# Patient Record
Sex: Female | Born: 1950 | Race: Black or African American | Hispanic: No | Marital: Single | Smoking: Former smoker
Health system: Southern US, Community
[De-identification: ages and names within clinical notes are randomized; demographics above are authoritative.]

## PROBLEM LIST (undated history)

## (undated) DIAGNOSIS — M5432 Sciatica, left side: Secondary | ICD-10-CM

## (undated) DIAGNOSIS — R519 Headache, unspecified: Secondary | ICD-10-CM

## (undated) DIAGNOSIS — M5416 Radiculopathy, lumbar region: Secondary | ICD-10-CM

## (undated) DIAGNOSIS — E785 Hyperlipidemia, unspecified: Secondary | ICD-10-CM

## (undated) DIAGNOSIS — G8929 Other chronic pain: Secondary | ICD-10-CM

## (undated) DIAGNOSIS — K219 Gastro-esophageal reflux disease without esophagitis: Secondary | ICD-10-CM

## (undated) DIAGNOSIS — R51 Headache: Secondary | ICD-10-CM

## (undated) DIAGNOSIS — I10 Essential (primary) hypertension: Secondary | ICD-10-CM

## (undated) DIAGNOSIS — M48061 Spinal stenosis, lumbar region without neurogenic claudication: Secondary | ICD-10-CM

## (undated) DIAGNOSIS — M79606 Pain in leg, unspecified: Secondary | ICD-10-CM

## (undated) DIAGNOSIS — M549 Dorsalgia, unspecified: Secondary | ICD-10-CM

## (undated) DIAGNOSIS — Z720 Tobacco use: Secondary | ICD-10-CM

## (undated) HISTORY — DX: Gastro-esophageal reflux disease without esophagitis: K21.9

## (undated) HISTORY — DX: Headache: R51

## (undated) HISTORY — DX: Spinal stenosis, lumbar region without neurogenic claudication: M48.061

## (undated) HISTORY — DX: Headache, unspecified: R51.9

## (undated) HISTORY — DX: Hyperlipidemia, unspecified: E78.5

## (undated) HISTORY — PX: TRACHEOSTOMY: SUR1362

## (undated) HISTORY — DX: Essential (primary) hypertension: I10

## (undated) HISTORY — DX: Tobacco use: Z72.0

---

## 1998-03-01 ENCOUNTER — Emergency Department (HOSPITAL_COMMUNITY): Admission: EM | Admit: 1998-03-01 | Discharge: 1998-03-01 | Payer: Self-pay | Admitting: Emergency Medicine

## 1999-07-18 ENCOUNTER — Ambulatory Visit (HOSPITAL_COMMUNITY): Admission: RE | Admit: 1999-07-18 | Discharge: 1999-07-18 | Payer: Self-pay | Admitting: Family Medicine

## 2000-02-17 ENCOUNTER — Encounter: Admission: RE | Admit: 2000-02-17 | Discharge: 2000-02-17 | Payer: Self-pay | Admitting: Family Medicine

## 2000-03-06 ENCOUNTER — Emergency Department (HOSPITAL_COMMUNITY): Admission: EM | Admit: 2000-03-06 | Discharge: 2000-03-06 | Payer: Self-pay | Admitting: *Deleted

## 2000-03-08 ENCOUNTER — Encounter: Admission: RE | Admit: 2000-03-08 | Discharge: 2000-03-08 | Payer: Self-pay | Admitting: Family Medicine

## 2000-06-12 ENCOUNTER — Encounter: Admission: RE | Admit: 2000-06-12 | Discharge: 2000-06-12 | Payer: Self-pay | Admitting: Family Medicine

## 2000-07-11 ENCOUNTER — Encounter: Admission: RE | Admit: 2000-07-11 | Discharge: 2000-07-11 | Payer: Self-pay | Admitting: Family Medicine

## 2000-09-07 ENCOUNTER — Encounter: Admission: RE | Admit: 2000-09-07 | Discharge: 2000-09-07 | Payer: Self-pay | Admitting: Family Medicine

## 2001-01-29 ENCOUNTER — Encounter: Admission: RE | Admit: 2001-01-29 | Discharge: 2001-01-29 | Payer: Self-pay | Admitting: Family Medicine

## 2001-04-15 ENCOUNTER — Ambulatory Visit (HOSPITAL_COMMUNITY): Admission: RE | Admit: 2001-04-15 | Discharge: 2001-04-15 | Payer: Self-pay | Admitting: Family Medicine

## 2001-05-09 ENCOUNTER — Encounter: Admission: RE | Admit: 2001-05-09 | Discharge: 2001-05-09 | Payer: Self-pay | Admitting: Family Medicine

## 2001-11-29 ENCOUNTER — Encounter: Admission: RE | Admit: 2001-11-29 | Discharge: 2001-11-29 | Payer: Self-pay | Admitting: Family Medicine

## 2002-05-06 ENCOUNTER — Encounter: Admission: RE | Admit: 2002-05-06 | Discharge: 2002-05-06 | Payer: Self-pay | Admitting: Family Medicine

## 2002-05-09 ENCOUNTER — Encounter: Admission: RE | Admit: 2002-05-09 | Discharge: 2002-05-09 | Payer: Self-pay | Admitting: Family Medicine

## 2002-09-03 ENCOUNTER — Emergency Department (HOSPITAL_COMMUNITY): Admission: EM | Admit: 2002-09-03 | Discharge: 2002-09-03 | Payer: Self-pay | Admitting: Emergency Medicine

## 2002-09-03 ENCOUNTER — Encounter: Payer: Self-pay | Admitting: Emergency Medicine

## 2002-11-20 ENCOUNTER — Encounter (INDEPENDENT_AMBULATORY_CARE_PROVIDER_SITE_OTHER): Payer: Self-pay | Admitting: *Deleted

## 2002-12-01 ENCOUNTER — Ambulatory Visit (HOSPITAL_COMMUNITY): Admission: RE | Admit: 2002-12-01 | Discharge: 2002-12-01 | Payer: Self-pay | Admitting: *Deleted

## 2002-12-17 ENCOUNTER — Encounter (INDEPENDENT_AMBULATORY_CARE_PROVIDER_SITE_OTHER): Payer: Self-pay | Admitting: *Deleted

## 2002-12-17 ENCOUNTER — Encounter: Admission: RE | Admit: 2002-12-17 | Discharge: 2002-12-17 | Payer: Self-pay | Admitting: Family Medicine

## 2003-08-11 ENCOUNTER — Encounter: Admission: RE | Admit: 2003-08-11 | Discharge: 2003-08-11 | Payer: Self-pay | Admitting: Sports Medicine

## 2004-02-11 ENCOUNTER — Ambulatory Visit: Payer: Self-pay | Admitting: Family Medicine

## 2004-02-11 ENCOUNTER — Encounter: Admission: RE | Admit: 2004-02-11 | Discharge: 2004-02-11 | Payer: Self-pay | Admitting: Sports Medicine

## 2005-03-14 ENCOUNTER — Ambulatory Visit: Payer: Self-pay | Admitting: Family Medicine

## 2005-04-11 ENCOUNTER — Encounter: Admission: RE | Admit: 2005-04-11 | Discharge: 2005-04-11 | Payer: Self-pay | Admitting: Sports Medicine

## 2005-04-11 ENCOUNTER — Ambulatory Visit: Payer: Self-pay | Admitting: Family Medicine

## 2005-12-08 ENCOUNTER — Ambulatory Visit: Payer: Self-pay

## 2005-12-08 ENCOUNTER — Ambulatory Visit (HOSPITAL_COMMUNITY): Admission: RE | Admit: 2005-12-08 | Discharge: 2005-12-08 | Payer: Self-pay | Admitting: Family Medicine

## 2006-02-13 ENCOUNTER — Emergency Department (HOSPITAL_COMMUNITY): Admission: EM | Admit: 2006-02-13 | Discharge: 2006-02-13 | Payer: Self-pay | Admitting: *Deleted

## 2006-04-16 ENCOUNTER — Encounter: Admission: RE | Admit: 2006-04-16 | Discharge: 2006-04-16 | Payer: Self-pay | Admitting: Sports Medicine

## 2006-05-11 ENCOUNTER — Encounter: Admission: RE | Admit: 2006-05-11 | Discharge: 2006-05-11 | Payer: Self-pay | Admitting: Sports Medicine

## 2006-07-19 DIAGNOSIS — I10 Essential (primary) hypertension: Secondary | ICD-10-CM | POA: Insufficient documentation

## 2006-07-20 ENCOUNTER — Encounter (INDEPENDENT_AMBULATORY_CARE_PROVIDER_SITE_OTHER): Payer: Self-pay | Admitting: *Deleted

## 2006-07-24 LAB — CONVERTED CEMR LAB: Pap Smear: NORMAL

## 2006-08-13 ENCOUNTER — Encounter (INDEPENDENT_AMBULATORY_CARE_PROVIDER_SITE_OTHER): Payer: Self-pay | Admitting: Family Medicine

## 2006-08-13 ENCOUNTER — Ambulatory Visit: Payer: Self-pay | Admitting: Family Medicine

## 2006-08-13 LAB — CONVERTED CEMR LAB
ALT: 25 units/L (ref 0–35)
AST: 30 units/L (ref 0–37)
Albumin: 4.4 g/dL (ref 3.5–5.2)
Alkaline Phosphatase: 105 units/L (ref 39–117)
BUN: 17 mg/dL (ref 6–23)
CO2: 24 meq/L (ref 19–32)
Creatinine, Ser: 0.78 mg/dL (ref 0.40–1.20)
Glucose, Bld: 100 mg/dL — ABNORMAL HIGH (ref 70–99)
MCV: 90.8 fL (ref 78.0–100.0)
Potassium: 3.9 meq/L (ref 3.5–5.3)
RBC: 4.25 M/uL (ref 3.87–5.11)
Sodium: 135 meq/L (ref 135–145)
Total Bilirubin: 0.4 mg/dL (ref 0.3–1.2)
Total Protein: 8.4 g/dL — ABNORMAL HIGH (ref 6.0–8.3)
WBC: 4.2 10*3/uL (ref 4.0–10.5)

## 2006-08-16 ENCOUNTER — Encounter (INDEPENDENT_AMBULATORY_CARE_PROVIDER_SITE_OTHER): Payer: Self-pay | Admitting: Family Medicine

## 2006-08-22 ENCOUNTER — Telehealth (INDEPENDENT_AMBULATORY_CARE_PROVIDER_SITE_OTHER): Payer: Self-pay | Admitting: *Deleted

## 2006-11-27 ENCOUNTER — Telehealth (INDEPENDENT_AMBULATORY_CARE_PROVIDER_SITE_OTHER): Payer: Self-pay | Admitting: Family Medicine

## 2007-08-07 ENCOUNTER — Telehealth (INDEPENDENT_AMBULATORY_CARE_PROVIDER_SITE_OTHER): Payer: Self-pay | Admitting: *Deleted

## 2007-09-02 ENCOUNTER — Ambulatory Visit: Payer: Self-pay | Admitting: Sports Medicine

## 2007-09-02 ENCOUNTER — Encounter (INDEPENDENT_AMBULATORY_CARE_PROVIDER_SITE_OTHER): Payer: Self-pay | Admitting: Family Medicine

## 2007-09-02 ENCOUNTER — Encounter: Admission: RE | Admit: 2007-09-02 | Discharge: 2007-09-02 | Payer: Self-pay | Admitting: Family Medicine

## 2007-09-02 DIAGNOSIS — Z87891 Personal history of nicotine dependence: Secondary | ICD-10-CM | POA: Insufficient documentation

## 2007-09-02 DIAGNOSIS — Z716 Tobacco abuse counseling: Secondary | ICD-10-CM | POA: Insufficient documentation

## 2007-09-02 LAB — CONVERTED CEMR LAB
ALT: 21 units/L (ref 0–35)
Albumin: 4.6 g/dL (ref 3.5–5.2)
BUN: 12 mg/dL (ref 6–23)
Chloride: 103 meq/L (ref 96–112)
Creatinine, Ser: 0.72 mg/dL (ref 0.40–1.20)
Direct LDL: 163 mg/dL — ABNORMAL HIGH
Glucose, Bld: 73 mg/dL (ref 70–99)
Pap Smear: NORMAL
Potassium: 4.1 meq/L (ref 3.5–5.3)
Total Bilirubin: 0.4 mg/dL (ref 0.3–1.2)
Total Protein: 8.5 g/dL — ABNORMAL HIGH (ref 6.0–8.3)

## 2007-09-03 ENCOUNTER — Encounter (INDEPENDENT_AMBULATORY_CARE_PROVIDER_SITE_OTHER): Payer: Self-pay | Admitting: Family Medicine

## 2007-09-05 ENCOUNTER — Telehealth (INDEPENDENT_AMBULATORY_CARE_PROVIDER_SITE_OTHER): Payer: Self-pay | Admitting: Family Medicine

## 2007-09-09 ENCOUNTER — Telehealth (INDEPENDENT_AMBULATORY_CARE_PROVIDER_SITE_OTHER): Payer: Self-pay | Admitting: *Deleted

## 2008-02-13 ENCOUNTER — Telehealth (INDEPENDENT_AMBULATORY_CARE_PROVIDER_SITE_OTHER): Payer: Self-pay | Admitting: *Deleted

## 2008-03-12 ENCOUNTER — Ambulatory Visit: Payer: Self-pay | Admitting: Family Medicine

## 2008-03-12 DIAGNOSIS — E785 Hyperlipidemia, unspecified: Secondary | ICD-10-CM | POA: Insufficient documentation

## 2008-11-02 ENCOUNTER — Ambulatory Visit: Payer: Self-pay | Admitting: Family Medicine

## 2008-11-02 ENCOUNTER — Encounter (INDEPENDENT_AMBULATORY_CARE_PROVIDER_SITE_OTHER): Payer: Self-pay | Admitting: Family Medicine

## 2008-11-02 ENCOUNTER — Encounter: Admission: RE | Admit: 2008-11-02 | Discharge: 2008-11-02 | Payer: Self-pay | Admitting: Family Medicine

## 2008-11-02 LAB — CONVERTED CEMR LAB
Albumin: 4.4 g/dL (ref 3.5–5.2)
Calcium: 9.8 mg/dL (ref 8.4–10.5)
Creatinine, Ser: 0.71 mg/dL (ref 0.40–1.20)
Potassium: 4.5 meq/L (ref 3.5–5.3)
Total Bilirubin: 0.3 mg/dL (ref 0.3–1.2)
Total CHOL/HDL Ratio: 4.1

## 2008-11-03 ENCOUNTER — Encounter (INDEPENDENT_AMBULATORY_CARE_PROVIDER_SITE_OTHER): Payer: Self-pay | Admitting: Family Medicine

## 2009-03-09 ENCOUNTER — Encounter: Payer: Self-pay | Admitting: Family Medicine

## 2009-03-09 ENCOUNTER — Telehealth: Payer: Self-pay | Admitting: Family Medicine

## 2009-11-23 ENCOUNTER — Ambulatory Visit: Payer: Self-pay | Admitting: Family Medicine

## 2009-11-23 ENCOUNTER — Encounter: Payer: Self-pay | Admitting: Family Medicine

## 2009-11-23 ENCOUNTER — Encounter: Admission: RE | Admit: 2009-11-23 | Discharge: 2009-11-23 | Payer: Self-pay | Admitting: Family Medicine

## 2009-11-23 DIAGNOSIS — J45909 Unspecified asthma, uncomplicated: Secondary | ICD-10-CM | POA: Insufficient documentation

## 2009-11-23 LAB — CONVERTED CEMR LAB
ALT: 20 units/L (ref 0–35)
AST: 23 units/L (ref 0–37)
Albumin: 4.8 g/dL (ref 3.5–5.2)
Alkaline Phosphatase: 90 units/L (ref 39–117)
BUN: 37 mg/dL — ABNORMAL HIGH (ref 6–23)
CO2: 21 meq/L (ref 19–32)
Calcium: 9.9 mg/dL (ref 8.4–10.5)
Chloride: 103 meq/L (ref 96–112)
Cholesterol: 231 mg/dL — ABNORMAL HIGH (ref 0–200)
Creatinine, Ser: 1.05 mg/dL (ref 0.40–1.20)
Glucose, Bld: 98 mg/dL (ref 70–99)
HDL: 60 mg/dL (ref >39)
LDL Cholesterol: 146 mg/dL — ABNORMAL HIGH (ref 0–99)
Potassium: 5.1 meq/L (ref 3.5–5.3)
Sodium: 137 meq/L (ref 135–145)
Total Bilirubin: 0.3 mg/dL (ref 0.3–1.2)
Total CHOL/HDL Ratio: 3.9
Total Protein: 8.5 g/dL — ABNORMAL HIGH (ref 6.0–8.3)
Triglycerides: 126 mg/dL (ref <150)
VLDL: 25 mg/dL (ref 0–40)

## 2009-12-21 ENCOUNTER — Telehealth (INDEPENDENT_AMBULATORY_CARE_PROVIDER_SITE_OTHER): Payer: Self-pay | Admitting: *Deleted

## 2010-03-06 ENCOUNTER — Encounter: Payer: Self-pay | Admitting: Family Medicine

## 2010-03-09 ENCOUNTER — Telehealth: Payer: Self-pay | Admitting: Family Medicine

## 2010-03-09 ENCOUNTER — Ambulatory Visit: Payer: Self-pay | Admitting: Family Medicine

## 2010-03-09 ENCOUNTER — Encounter: Payer: Self-pay | Admitting: Family Medicine

## 2010-03-09 ENCOUNTER — Inpatient Hospital Stay (HOSPITAL_COMMUNITY): Admission: EM | Admit: 2010-03-09 | Discharge: 2010-03-12 | Payer: Self-pay | Admitting: Emergency Medicine

## 2010-03-23 ENCOUNTER — Encounter: Payer: Self-pay | Admitting: Family Medicine

## 2010-03-23 ENCOUNTER — Ambulatory Visit: Payer: Self-pay | Admitting: Family Medicine

## 2010-03-23 DIAGNOSIS — M5430 Sciatica, unspecified side: Secondary | ICD-10-CM | POA: Insufficient documentation

## 2010-03-28 ENCOUNTER — Encounter: Payer: Self-pay | Admitting: Family Medicine

## 2010-03-30 ENCOUNTER — Encounter
Admission: RE | Admit: 2010-03-30 | Discharge: 2010-04-26 | Payer: Self-pay | Source: Home / Self Care | Admitting: Family Medicine

## 2010-03-30 ENCOUNTER — Encounter: Payer: Self-pay | Admitting: Family Medicine

## 2010-03-31 ENCOUNTER — Encounter: Payer: Self-pay | Admitting: Family Medicine

## 2010-04-22 ENCOUNTER — Ambulatory Visit: Payer: Self-pay | Admitting: Family Medicine

## 2010-04-22 ENCOUNTER — Telehealth: Payer: Self-pay | Admitting: Family Medicine

## 2010-04-25 ENCOUNTER — Encounter: Payer: Self-pay | Admitting: Family Medicine

## 2010-04-25 ENCOUNTER — Telehealth: Payer: Self-pay | Admitting: *Deleted

## 2010-05-10 ENCOUNTER — Telehealth: Payer: Self-pay | Admitting: Family Medicine

## 2010-05-10 ENCOUNTER — Ambulatory Visit: Payer: Self-pay | Admitting: Family Medicine

## 2010-05-11 ENCOUNTER — Encounter: Payer: Self-pay | Admitting: Family Medicine

## 2010-05-20 ENCOUNTER — Encounter: Payer: Self-pay | Admitting: Family Medicine

## 2010-05-20 ENCOUNTER — Telehealth (INDEPENDENT_AMBULATORY_CARE_PROVIDER_SITE_OTHER): Payer: Self-pay | Admitting: *Deleted

## 2010-05-24 ENCOUNTER — Encounter: Payer: Self-pay | Admitting: Family Medicine

## 2010-05-26 ENCOUNTER — Encounter (INDEPENDENT_AMBULATORY_CARE_PROVIDER_SITE_OTHER): Payer: Self-pay | Admitting: *Deleted

## 2010-06-13 ENCOUNTER — Ambulatory Visit: Admission: RE | Admit: 2010-06-13 | Discharge: 2010-06-13 | Payer: Self-pay | Source: Home / Self Care

## 2010-06-15 ENCOUNTER — Encounter
Admission: RE | Admit: 2010-06-15 | Discharge: 2010-06-21 | Payer: Self-pay | Source: Home / Self Care | Attending: Physical Medicine & Rehabilitation | Admitting: Physical Medicine & Rehabilitation

## 2010-06-16 ENCOUNTER — Observation Stay (HOSPITAL_COMMUNITY)
Admission: EM | Admit: 2010-06-16 | Discharge: 2010-06-17 | Payer: Self-pay | Source: Home / Self Care | Attending: Emergency Medicine | Admitting: Emergency Medicine

## 2010-06-16 ENCOUNTER — Telehealth: Payer: Self-pay | Admitting: Family Medicine

## 2010-06-16 LAB — POCT I-STAT, CHEM 8
BUN: 19 mg/dL (ref 6–23)
Calcium, Ion: 1.16 mmol/L (ref 1.12–1.32)
Hemoglobin: 16.7 g/dL — ABNORMAL HIGH (ref 12.0–15.0)

## 2010-06-16 LAB — DIFFERENTIAL
Basophils Absolute: 0 10*3/uL (ref 0.0–0.1)
Eosinophils Absolute: 0.1 10*3/uL (ref 0.0–0.7)
Eosinophils Relative: 1 % (ref 0–5)
Lymphocytes Relative: 23 % (ref 12–46)
Monocytes Absolute: 0.8 10*3/uL (ref 0.1–1.0)
Neutro Abs: 6.6 10*3/uL (ref 1.7–7.7)

## 2010-06-16 LAB — URINALYSIS, ROUTINE W REFLEX MICROSCOPIC
Ketones, ur: 15 mg/dL — AB
Leukocytes, UA: NEGATIVE
Specific Gravity, Urine: 1.034 — ABNORMAL HIGH (ref 1.005–1.030)
pH: 5.5 (ref 5.0–8.0)

## 2010-06-16 LAB — URINE MICROSCOPIC-ADD ON

## 2010-06-16 LAB — CBC
Hemoglobin: 15.5 g/dL — ABNORMAL HIGH (ref 12.0–15.0)
MCH: 30 pg (ref 26.0–34.0)
MCHC: 34.2 g/dL (ref 30.0–36.0)
MCV: 87.6 fL (ref 78.0–100.0)
Platelets: 326 10*3/uL (ref 150–400)
RBC: 5.17 MIL/uL — ABNORMAL HIGH (ref 3.87–5.11)
RDW: 13 % (ref 11.5–15.5)
WBC: 9.6 10*3/uL (ref 4.0–10.5)

## 2010-06-17 ENCOUNTER — Ambulatory Visit: Admit: 2010-06-17 | Payer: Self-pay | Admitting: Physical Medicine & Rehabilitation

## 2010-06-17 LAB — COMPREHENSIVE METABOLIC PANEL: CO2: 20 mEq/L (ref 19–32)

## 2010-06-20 ENCOUNTER — Telehealth: Payer: Self-pay | Admitting: *Deleted

## 2010-06-21 NOTE — Letter (Signed)
Summary: Disability Claim Form  Disability Claim Form   Imported By: De Nurse 03/31/2010 12:10:05  _____________________________________________________________________  External Attachment:    Type:   Image     Comment:   External Document

## 2010-06-21 NOTE — Letter (Signed)
Summary: Out of Work  [facility]  [address]   [place], [place] [address]   Phone: [phone]  Fax: [phone]    March 09, 2010   Employee:  Lorra A Carline    To Whom It May Concern:   For Medical reasons, please excuse the above named employee from work for the following dates:  Start:   March 09, 2010  End:   March 15, 2010  If you need additional information, please feel free to contact our office.         Sincerely,    Alvia Grove DO
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
## 2010-06-21 NOTE — Progress Notes (Signed)
Summary: Letter  Phone Note Call from Patient Call back at Home Phone [phone]   Reason for Call: Talk to Nurse Summary of Call: pt requesting letter stating when she can return to work, pt needs MD to specify how long she will be out. Initial call taken by: Knox Royalty,  April 22, 2010 2:25 PM  Follow-up for Phone Call        letter completed and sent to admin. Follow-up by: Helane Rima DO,  April 25, 2010 8:12 AM

## 2010-06-21 NOTE — Progress Notes (Signed)
Summary: refill  Phone Note Refill Request Call back at Work Phone [phone] Message from:  Patient  Refills Requested: Medication #1:  PERCOCET 5-325 MG TABS 1-2 tabs by mouth three times a day as needed for back pain Initial call taken by: De Nurse,  March 09, 2009 11:28 AM  Follow-up for Phone Call        to pcp Follow-up by: Golden Circle RN,  March 09, 2009 11:48 AM  Additional Follow-up for Phone Call Additional follow up Details #1::        will supply small amount since previous PCP did this, but I would like for her to make an appointment to see me in December so that I can evaluate her.   please call in. Additional Follow-up by: Helane Rima DO,  March 09, 2009 12:16 PM    Prescriptions: PERCOCET 5-325 MG TABS (OXYCODONE-ACETAMINOPHEN) 1-2 tabs by mouth three times a day as needed for back pain  #25 x 0   Entered and Authorized by:   Helane Rima DO   Signed by:   Helane Rima DO on 03/09/2009   Method used:   Telephoned to ...       [facility] [address] [phone]* (retail)       [address]       [place], [place]  [address]       Ph: [phone]       Fax: [phone]   RxID:   [id number]   Appended Document: refill LMOVM for pt to call back about info.  Will forward to admin team.  Appended Document: refill pt called back and sched appt for Dec

## 2010-06-21 NOTE — Progress Notes (Signed)
Summary: back pain   Phone Note Call from Patient   Summary of Call: Patient reports sciatic nerve pain worse today. Advised to call for appointment to be seen. Will call triage line  Initial call taken by: Bobby Rumpf  MD,  March 09, 2010 7:42 AM     Appended Document: back pain  she will be here at 8:30 work in. has to find a ride. states she can barely walk

## 2010-06-21 NOTE — Progress Notes (Signed)
Summary: referral  Phone Note Call from Patient Call back at Home Phone [phone]   Caller: Patient Summary of Call: pt was supposed to be referred to Pain Management and needs to know about this Initial call taken by: De Nurse,  April 25, 2010 8:45 AM  Follow-up for Phone Call        referral now in. Follow-up by: Helane Rima DO,  April 27, 2010 2:11 PM  Additional Follow-up for Phone Call Additional follow up Details #1::        faxed referral to pain clinic. Additional Follow-up by: Tessie Fass CMA,  April 28, 2010 11:56 AM

## 2010-06-21 NOTE — Miscellaneous (Signed)
Summary: FMLA Papers  Patients husband dropped off papers to be filled out for her to return to work.  Please call when completed. Bradly Bienenstock  March 28, 2010 9:42 AM   Placed in Dr. Alexander Mt box in Dr. Philis Pique absence. Theresia Lo RN  March 28, 2010 2:36 PM  Forms were already completed by Dr. Earlene Plater, will scan then patient can pick up, date of 04/22/10 was put on the FMLA for a return date to work  History of Present Illness: PCP:  Helane Rima DO

## 2010-06-21 NOTE — Consult Note (Signed)
Summary: Out Pt Rehab  Out Pt Rehab   Imported By: De Nurse 04/27/2010 16:05:31  _____________________________________________________________________  External Attachment:    Type:   Image     Comment:   External Document

## 2010-06-21 NOTE — Miscellaneous (Signed)
Summary: need hard copy  Clinical Lists Changes [facility] called to let md know that this rx for percocet cannot be faxed or called. need hard copy written so pt can come get it.Golden Circle RN  March 09, 2009 4:21 PM  Medications: Rx of PERCOCET 5-325 MG TABS (OXYCODONE-ACETAMINOPHEN) 1-2 tabs by mouth three times a day as needed for back pain;  #25 x 0;  Signed;  Entered by: Helane Rima DO;  Authorized by: Helane Rima DO;  Method used: Print then Give to Patient    Prescriptions: PERCOCET 5-325 MG TABS (OXYCODONE-ACETAMINOPHEN) 1-2 tabs by mouth three times a day as needed for back pain  #25 x 0   Entered and Authorized by:   Helane Rima DO   Signed by:   Helane Rima DO on 03/10/2009   Method used:   Print then Give to Patient   RxID:   [id number]  Please call patient for pick-up.  Appended Document: need hard copy LVM that rx was ready to be picked up.

## 2010-06-21 NOTE — Miscellaneous (Signed)
Summary: Asthma Update - Intermittent     New Problems: ASTHMA, INTERMITTENT (ICD-493.90)   New Problems: ASTHMA, INTERMITTENT (ICD-493.90) 

## 2010-06-21 NOTE — Assessment & Plan Note (Signed)
Summary: hfu,df   Vital Signs:  Patient profile:   60 year old female Height:      63 inches Weight:      149 pounds BMI:     26.49 Temp:     98.4 degrees F oral Pulse rate:   88 / minute BP sitting:   126 / 75  (left arm) Cuff size:   regular  Vitals Entered By: Tessie Fass CMA (March 23, 2010 8:52 AM) CC: hospital f/u Is Patient Diabetic? No Pain Assessment Patient in pain? yes     Location: left leg Intensity: 8   Primary Care Provider:  Helane Rima DO  CC:  hospital f/u.  History of Present Illness: 60 yo F:  1. Sciatica secondary to foraminal stenosis and spondylolisthesis. MRI findings significant for L5-S1 spondylolisthesis and mild L4 foraminal stenosis at L4-L5.  The patient's case was discussed with surgery while patient hospitalized, and determined she is a surgical candidate; however, the patient declined surgery at this time, instead the patient agreed with maximizing pain control and advancing physical therapy as tolerated.  Physical Therapy was consulted on hospital day #2.  The patient's pain medication regimen was adjusted to maximize pain control.  Toradol and prednisone were discontinued.  The patient's dose of gabapentin was increased.  On the hospital day #2, Dilaudid was also discontinued and the patient was continued on p.o. and  transitioned to p.o. MS Contin, naproxen, and morphine to control her pain.  Physical Therapy was advanced as tolerated.  By date of discharge, the patient was able to walk with a walker.  She was well aware that she would need outpatient physical therapy. She provided SMLA forms from her job and that were filled out by Dr. Armen Pickup who recommended 3 weeks of outpatient PT and to follow up with the patient's primary MD in 1-2 weeks to reassess pain and progressive of function.  Today, the patient presents with paperwork from HHPT/OT, recommending further outpatient PT. A HH Safety Evaluation has been completed. Her pain is  fairly controlled on her current regimen, though she says that is never better that 6/10. Function has improved. Still using a walker. No new s/s.    Current Medications (verified): 1)  Ms Contin 30 Mg Xr12h-Tab (Morphine Sulfate) .... One By Mouth Bid 2)  Lisinopril-Hydrochlorothiazide 20-25 Mg Tabs (Lisinopril-Hydrochlorothiazide) .... 1/2 Tablet By Mouth Daily For High Blood Pressure 3)  Ventolin Hfa 108 (90 Base) Mcg/act Aers (Albuterol Sulfate) .Marland Kitchen.. 1-2 Puffs Inhaled Q4h As Needed For Wheezing 4)  Neurontin 300 Mg Caps (Gabapentin) .Marland Kitchen.. 1 Pill By Mouth Daily X 1 Day Then Increase To Two Times A Day 5)  Morphine Sulfate 15 Mg Tabs (Morphine Sulfate) .... One By Mouth Q 4 Hours As Needed Pain 6)  Neurontin 800 Mg Tabs (Gabapentin) .... One By Mouth Tid  Allergies (verified): No Known Drug Allergies  Past History:  Past Medical History: HTN HLD  Tobacco Abuse Back Pain    - Sciatica secondary to foraminal stenosis and spondylolisthesis.     - MRI findings significant for L5-S1 spondylolisthesis and mild L4 foraminal stenosis at L4-L5.      - Outpatient PT/OT.    [facility] Safety Evaluation Completed.    - Surgery has been offered to patient. PMH-FH-SH reviewed for relevance  Review of Systems General:  Denies chills and fever. CV:  Denies chest pain or discomfort, shortness of breath with exertion, and swelling of feet. GI:  Denies abdominal pain, constipation, diarrhea,  nausea, and vomiting. MS:  Complains of joint pain and low back pain; denies joint redness, joint swelling, and loss of strength. Neuro:  Denies brief paralysis, falling down, headaches, numbness, tingling, and weakness.  Physical Exam  General:  Well-developed,well-nourished, in no acute distress; alert, appropriate and cooperative throughout examination. Vitals reviewed. Msk:  Tender to palpation in lower lumbar paraspinal muscles. Decreased ROM in flexion/extension due to pain. + SLR on right. Pulses:  R and L  dorsalis pedis and posterior tibial pulses are full and equal bilaterally. Extremities:  No edema. Neurologic:  Sensory, motor and coordinative functions appear intact.   Impression & Recommendations:  Problem # 1:  SCIATICA, LEFT (ICD-724.3) Assessment Unchanged  Patient stable. No red flags. Will order further PT/OT. Continue pain control regimen as patient more functional with it. No signs of abuse. No sedation. Her updated medication list for this problem includes:    Ms Contin 30 Mg Xr12h-tab (Morphine sulfate) ..... One by mouth bid    Morphine Sulfate 15 Mg Tabs (Morphine sulfate) ..... One by mouth q 4 hours as needed pain    Naprosyn 500 Mg Tabs (Naproxen) ..... One by mouth tid  Orders: [facility]- Est  Level 4 ([address]109) Physical Therapy Referral (PT)  Problem # 2:  TOBACCO ABUSE (ICD-305.1)  Encouraged cessation.  Orders: [facility]- Est  Level 4 ([address]454)  Complete Medication List: 1)  Ms Contin 30 Mg Xr12h-tab (Morphine sulfate) .... One by mouth bid 2)  Lisinopril-hydrochlorothiazide 20-25 Mg Tabs (Lisinopril-hydrochlorothiazide) .... 1/2 tablet by mouth daily for high blood pressure 3)  Ventolin Hfa 108 (90 Base) Mcg/act Aers (Albuterol sulfate) .Marland Kitchen.. 1-2 puffs inhaled q4h as needed for wheezing 4)  Neurontin 300 Mg Caps (Gabapentin) .Marland Kitchen.. 1 pill by mouth daily x 1 day then increase to two times a day 5)  Morphine Sulfate 15 Mg Tabs (Morphine sulfate) .... One by mouth q 4 hours as needed pain 6)  Neurontin 800 Mg Tabs (Gabapentin) .... One by mouth tid 7)  Naprosyn 500 Mg Tabs (Naproxen) .... One by mouth tid  Patient Instructions: 1)  It was nice to se you today! 2)  We will call you about the PT/OT referral. 3)  Continue your pain regimen. I am increasing your Gabapentin. 4)  Follow up in one month. Prescriptions: LISINOPRIL-HYDROCHLOROTHIAZIDE 20-25 MG TABS (LISINOPRIL-HYDROCHLOROTHIAZIDE) 1/2 tablet by mouth daily for high blood pressure  #45 x 3   Entered and Authorized  by:   Helane Rima DO   Signed by:   Helane Rima DO on 03/23/2010   Method used:   Electronically to        [facility] [phone]* (retail)       [address]       [place], [place]  [address]       Ph: [phone]       Fax: [phone]   RxID:   [id number] VENTOLIN HFA 108 (90 BASE) MCG/ACT AERS (ALBUTEROL SULFATE) 1-2 puffs inhaled q4h as needed for wheezing  #1 x 3   Entered and Authorized by:   Helane Rima DO   Signed by:   Helane Rima DO on 03/23/2010   Method used:   Electronically to        [facility] [phone]* (retail)       [address]       [place], [place]  [address]       Ph: [phone]       Fax: [phone]   RxID:  [id number] LISINOPRIL-HYDROCHLOROTHIAZIDE 20-25 MG TABS (LISINOPRIL-HYDROCHLOROTHIAZIDE) 1/2 tablet by mouth daily for high blood pressure  #30 x 11   Entered and Authorized by:   Helane Rima DO   Signed by:   Helane Rima DO on 03/23/2010   Method used:   Electronically to        [facility] [phone]* (retail)       [address]       [place], [place]  [address]       Ph: [phone]       Fax: [phone]   RxID:   [id number] NEURONTIN 800 MG TABS (GABAPENTIN) one by mouth TID  #90 x 1   Entered and Authorized by:   Helane Rima DO   Signed by:   Helane Rima DO on 03/23/2010   Method used:   Print then Give to Patient   RxID:   [id number] MORPHINE SULFATE 15 MG TABS (MORPHINE SULFATE) one by mouth q 4 hours as needed pain  #120 x 0   Entered and Authorized by:   Helane Rima DO   Signed by:   Helane Rima DO on 03/23/2010   Method used:   Print then Give to Patient   RxID:   [id number] MS CONTIN 30 MG XR12H-TAB (MORPHINE SULFATE) one by mouth BID  #60 x 0   Entered and Authorized by:   Helane Rima DO   Signed by:   Helane Rima DO on 03/23/2010   Method used:   Print then Give to Patient   RxID:   [id number]    Orders Added: 1)  [facility]- Est  Level 4 [54270] 2)   Physical Therapy Referral [PT]    Prevention & Chronic Care Immunizations   Influenza vaccine: Not documented   Influenza vaccine deferral: Not available  (11/23/2009)   Influenza vaccine due: Refused  (03/12/2008)    Tetanus booster: 04/21/2001: Done.   Tetanus booster due: 04/22/2011    Pneumococcal vaccine: Not documented    H. zoster vaccine: Not documented  Colorectal Screening   Hemoccult: Not documented   Hemoccult due: Not Indicated    Colonoscopy: Done.  (02/19/2005)   Colonoscopy due: 02/20/2015  Other Screening   Pap smear: normal  (09/02/2007)   Pap smear action/deferral: Deferred-3 yr interval  (11/23/2009)   Pap smear due: 09/02/2010    Mammogram: ASSESSMENT: Negative - BI-RADS 1^MM DIGITAL SCREENING  (11/23/2009)   Mammogram action/deferral: Not indicated  (11/23/2009)   Mammogram due: 09/01/2008    DXA bone density scan: Not documented   Smoking status: current  (03/09/2010)   Smoking cessation counseling: yes  (11/23/2009)  Lipids   Total Cholesterol: 231  (11/23/2009)   LDL: 146  (11/23/2009)   LDL Direct: 163  (09/02/2007)   HDL: 60  (11/23/2009)   Triglycerides: 126  (11/23/2009)    SGOT (AST): 23  (11/23/2009)   SGPT (ALT): 20  (11/23/2009)   Alkaline phosphatase: 90  (11/23/2009)   Total bilirubin: 0.3  (11/23/2009)    Lipid flowsheet reviewed?: Yes   Progress toward LDL goal: Unchanged  Hypertension   Last Blood Pressure: 126 / 75  (03/23/2010)   Serum creatinine: 1.05  (11/23/2009)   Serum potassium 5.1  (11/23/2009)    Hypertension flowsheet reviewed?: Yes   Progress toward BP goal: At goal  Self-Management Support :   Personal Goals (by the next clinic visit) :      Personal blood pressure goal: 140/90  (11/23/2009)     Personal LDL goal:  100  (11/23/2009)    Patient will work on the following items until the next clinic visit to reach self-care goals:     Medications and monitoring: take my medicines every day, bring all  of my medications to every visit  (03/23/2010)     Eating: drink diet soda or water instead of juice or soda, eat more vegetables, use fresh or frozen vegetables, eat foods that are low in salt, eat baked foods instead of fried foods, eat fruit for snacks and desserts, limit or avoid alcohol  (03/23/2010)     Activity: take a 30 minute walk every day, take the stairs instead of the elevator, park at the far end of the parking lot  (11/23/2009)    Hypertension self-management support: Written self-care plan  (03/23/2010)   Hypertension self-care plan printed.    Lipid self-management support: Written self-care plan  (03/23/2010)   Lipid self-care plan printed.

## 2010-06-21 NOTE — Assessment & Plan Note (Signed)
Summary: [name]   Vital Signs:  Patient profile:   60 year old female Height:      63 inches Weight:      152.2 pounds BMI:     27.06 Pulse rate:   72 / minute BP sitting:   100 / 60  (right arm)  Vitals Entered By: Arlyss Repress CMA, (November 23, 2009 8:29 AM) CC: physical and refill pain meds. Is Patient Diabetic? No Pain Assessment Patient in pain? yes     Location: lower back Intensity: 7 Onset of pain  Chronic   Primary Care Provider:  Helane Rima DO  CC:  physical and refill pain meds..  History of Present Illness: 60yo F here for [name] and to meet new MD:  1. HTN: Rx Lisinopril 20/HCTZ 25 po daily.  No CP, SOB, or LE edema.   2. HLD: Rx Pravastatin. Denies myalgias.  3. Tobacco Abuse: Still smoking 5 cigs daily.  Not interested in quitting smoking.  4. ETOH Abuse: drinks 3 [employer] beers on Fri and Sat only.  Does not drink during week.  5. Low Back Pain: chronic, Rx Percocet only during acute flares. Receives Rx yearly. Uses sparingly.   6. Asthma: Rx albuterol. Uses rarely. Last used  ~ 4 months ago.  7. PAP: Last pap was 08/2008 and was normal.  Has never had an abnormal pap smear before.  Monogamous with husband.  No vaginal d/c, itching, or bleeding.  Habits & Providers  Alcohol-Tobacco-Diet     Tobacco Status: current     Tobacco Counseling: to quit use of tobacco products     Cigarette Packs/Day: 0.25  Comments: not interested in quitting  Current Medications (verified): 1)  Percocet 5-325 Mg Tabs (Oxycodone-Acetaminophen) .Marland Kitchen.. 1-2 Tabs By Mouth Three Times A Day As Needed For Back Pain 2)  Lisinopril-Hydrochlorothiazide 20-25 Mg Tabs (Lisinopril-Hydrochlorothiazide) .... 1/2 Tablet By Mouth Daily For High Blood Pressure 3)  Naprosyn 500 Mg Tabs (Naproxen) .Marland Kitchen.. 1 Tablet By Mouth Three Times A Day As Needed For Headache 4)  Ventolin Hfa 108 (90 Base) Mcg/act Aers (Albuterol Sulfate) .Marland Kitchen.. 1-2 Puffs Inhaled Q4h As Needed For Wheezing  Allergies  (verified): No Known Drug Allergies PMH-FH-SH reviewed for relevance  Review of Systems General:  Denies chills and fever. CV:  Denies chest pain or discomfort, palpitations, shortness of breath with exertion, and swelling of feet. Resp:  Denies cough and wheezing. GI:  Denies abdominal pain, constipation, diarrhea, nausea, and vomiting. GU:  Denies abnormal vaginal bleeding, discharge, and dysuria. MS:  Complains of low back pain. Derm:  Denies rash. Neuro:  Denies falling down, headaches, numbness, and tingling.  Physical Exam  General:  Well-developed,well-nourished, in no acute distress; alert, appropriate and cooperative throughout examination. Vitals reviewed. Head:  Normocephalic and atraumatic without obvious abnormalities.  Eyes:  No corneal or conjunctival inflammation noted. EOMI. Perrla. Vision grossly normal. Ears:  R ear normal and L ear normal.   Nose:  External nasal examination shows no deformity or inflammation. Nasal mucosa are pink and moist without lesions or exudates. Mouth:  Oral mucosa and oropharynx without lesions or exudates.  Neck:  Supple, no masses.  Healed surgical wound. Lungs:  Normal respiratory effort, chest expands symmetrically. Lungs are clear to auscultation, no crackles or wheezes. Heart:  Normal rate and regular rhythm. S1 and S2 normal without gallop, murmur, click, rub or other extra sounds. Abdomen:  Bowel sounds positive,abdomen soft and non-tender without masses, organomegaly or hernias noted. Pulses:  R and  Ldorsalis pedis and posterior tibial pulses are full and equal bilaterally. Extremities:  No clubbing, cyanosis, edema, or deformity noted with normal full range of motion of all joints.   Neurologic:  Alert & oriented X3, cranial nerves II-XII intact, strength normal in all extremities, sensation intact to light touch, and gait normal.   Skin:  Intact without suspicious lesions or rashes. Psych:  Normally interactive, good eye contact,  not anxious appearing, and not depressed appearing.     Impression & Recommendations:  Problem # 1:  HYPERTENSION, BENIGN SYSTEMIC (ICD-401.1) Assessment Unchanged BP slightly low. Advised patient to decrease medication to 1/2 tab daily. Advised to check BP at pharmacy weekly. Her updated medication list for this problem includes:    Lisinopril-hydrochlorothiazide 20-25 Mg Tabs (Lisinopril-hydrochlorothiazide) .Marland Kitchen... 1/2 tablet by mouth daily for high blood pressure  Orders: Comp Met-[facility] ([id number]) [facility] - Est  40-64 yrs ([address])  Problem # 2:  HYPERLIPIDEMIA (ICD-272.4) Assessment: Unchanged Recheck FLP today. Orders: Comp Met-[facility] [id number]) Lipid-[facility] [id number]) [facility] - Est  40-64 yrs ([address])  Problem # 3:  TOBACCO ABUSE (ICD-305.1) Assessment: Unchanged  Declines cessation today.  Orders: [facility] - Est  40-64 yrs ([address])  Problem # 4:  BACK PAIN (ICD-724.5) Assessment: Unchanged  Refilled Rx Percocet today as patient has chronic low back pain with rare flares. No Hx of abusing Rx. Her updated medication list for this problem includes:    Percocet 5-325 Mg Tabs (Oxycodone-acetaminophen) .Marland Kitchen... 1-2 tabs by mouth three times a day as needed for back pain    Naprosyn 500 Mg Tabs (Naproxen) .Marland Kitchen... 1 tablet by mouth three times a day as needed for headache  Orders: [facility] - Est  40-64 yrs ([address])  Problem # 5:  ASTHMA, INTERMITTENT, MILD (ICD-493.90) Assessment: Unchanged  Uses albuterol rarely.  Her updated medication list for this problem includes:    Ventolin Hfa 108 (90 Base) Mcg/act Aers (Albuterol sulfate) .Marland Kitchen... 1-2 puffs inhaled q4h as needed for wheezing  Orders: [facility] - Est  40-64 yrs ([address])  Complete Medication List: 1)  Percocet 5-325 Mg Tabs (Oxycodone-acetaminophen) .Marland Kitchen.. 1-2 tabs by mouth three times a day as needed for back pain 2)  Lisinopril-hydrochlorothiazide 20-25 Mg Tabs (Lisinopril-hydrochlorothiazide) .... 1/2 tablet by mouth daily for high blood  pressure 3)  Naprosyn 500 Mg Tabs (Naproxen) .Marland Kitchen.. 1 tablet by mouth three times a day as needed for headache 4)  Ventolin Hfa 108 (90 Base) Mcg/act Aers (Albuterol sulfate) .Marland Kitchen.. 1-2 puffs inhaled q4h as needed for wheezing  Patient Instructions: 1)  Your pap smear is due in April 2013. 2)  We will check bloodwork today.   Prescriptions: PERCOCET 5-325 MG TABS (OXYCODONE-ACETAMINOPHEN) 1-2 tabs by mouth three times a day as needed for back pain  #25 x 0   Entered and Authorized by:   Helane Rima DO   Signed by:   Helane Rima DO on 11/23/2009   Method used:   Print then Give to Patient   RxID:   [id number] VENTOLIN HFA 108 (90 BASE) MCG/ACT AERS (ALBUTEROL SULFATE) 1-2 puffs inhaled q4h as needed for wheezing  #1 x 3   Entered and Authorized by:   Helane Rima DO   Signed by:   Helane Rima DO on 11/23/2009   Method used:   Electronically to        [facility] [phone]* (retail)       [address]       [place], [place]  [address]  Ph: [phone]       Fax: [phone]   RxID:   [id number] LISINOPRIL-HYDROCHLOROTHIAZIDE 20-25 MG TABS (LISINOPRIL-HYDROCHLOROTHIAZIDE) 1/2 tablet by mouth daily for high blood pressure  #30 x 11   Entered and Authorized by:   Helane Rima DO   Signed by:   Helane Rima DO on 11/23/2009   Method used:   Electronically to        [facility] [phone]* (retail)       [address]       [place], [place]  [address]       Ph: [phone]       Fax: [phone]   RxID:   [id number]   Prevention & Chronic Care Immunizations   Influenza vaccine: Not documented   Influenza vaccine deferral: Not available  (11/23/2009)   Influenza vaccine due: Refused  (03/12/2008)    Tetanus booster: 04/21/2001: Done.   Tetanus booster due: 04/22/2011    Pneumococcal vaccine: Not documented  Colorectal Screening   Hemoccult: Not documented   Hemoccult due: Not Indicated    Colonoscopy: Done.  (02/19/2005)   Colonoscopy  due: 02/20/2015  Other Screening   Pap smear: normal  (09/02/2007)   Pap smear action/deferral: Deferred-3 yr interval  (11/23/2009)   Pap smear due: 09/02/2010    Mammogram: ASSESSMENT: Negative - BI-RADS 1^MM DIGITAL SCREENING  (11/02/2008)   Mammogram action/deferral: Not indicated  (11/23/2009)   Mammogram due: 09/01/2008  Reports requested:   Last mammogram report requested.  Smoking status: current  (11/23/2009)   Smoking cessation counseling: yes  (11/23/2009)    Screening comments: Mammogram Done Today.  Lipids   Total Cholesterol: 220  (11/02/2008)   LDL: 130  (11/02/2008)   LDL Direct: 163  (09/02/2007)   HDL: 54  (11/02/2008)   Triglycerides: 180  (11/02/2008)    SGOT (AST): 27  (11/02/2008)   SGPT (ALT): 24  (11/02/2008) CMP ordered    Alkaline phosphatase: 106  (11/02/2008)   Total bilirubin: 0.3  (11/02/2008)    Lipid flowsheet reviewed?: Yes   Progress toward LDL goal: Unchanged  Hypertension   Last Blood Pressure: 100 / 60  (11/23/2009)   Serum creatinine: 0.71  (11/02/2008)   Serum potassium 4.5  (11/02/2008) CMP ordered     Hypertension flowsheet reviewed?: Yes   Progress toward BP goal: At goal  Self-Management Support :   Personal Goals (by the next clinic visit) :      Personal blood pressure goal: 140/90  (11/23/2009)     Personal LDL goal: 100  (11/23/2009)    Patient will work on the following items until the next clinic visit to reach self-care goals:     Medications and monitoring: take my medicines every day, bring all of my medications to every visit  (11/23/2009)     Eating: drink diet soda or water instead of juice or soda, eat more vegetables, use fresh or frozen vegetables, eat foods that are low in salt, eat baked foods instead of fried foods, eat fruit for snacks and desserts, limit or avoid alcohol  (11/23/2009)     Activity: take a 30 minute walk every day, take the stairs instead of the elevator, park at the far end of the  parking lot  (11/23/2009)    Hypertension self-management support: Written self-care plan  (11/23/2009)   Hypertension self-care plan printed.    Lipid self-management support: Written self-care plan  (11/23/2009)   Lipid self-care plan printed.   Nursing Instructions: Request report of  last mammogram    Appended Document: Orders Update    Clinical Lists Changes  Orders: Added new Test order of [facility]- Est  Level 4 (95621) - Signed

## 2010-06-21 NOTE — Letter (Signed)
Summary: Disability Papers  Disability Papers   Imported By: De Nurse 03/30/2010 15:24:09  _____________________________________________________________________  External Attachment:    Type:   Image     Comment:   External Document
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
## 2010-06-21 NOTE — Letter (Signed)
Summary: Out of Work  [facility]  [address]   [place], [place] [address]   Phone: [phone]  Fax: [phone]    April 25, 2010   Employee:  [name]    To Whom It May Concern:   For Medical reasons, please excuse the above named employee from work for the following dates:  Start:   Now  End:   At least one month from now (05/26/10)  Note: Patient unable to do ANY lifting, bending, or twisting at this time. She is also unable to stand for more than minutes at a time.  If you need additional information, please feel free to contact our office.         Sincerely,    Helane Rima DO  Appended Document: Out of Work faxed

## 2010-06-21 NOTE — Progress Notes (Signed)
  Phone Note Call from Patient   Caller: Patient Summary of Call: pt is wanting a copy of labs sent to her house. Initial call taken by: De Nurse,  December 21, 2009 9:37 AM     Appended Document:  mailed

## 2010-06-22 ENCOUNTER — Ambulatory Visit (INDEPENDENT_AMBULATORY_CARE_PROVIDER_SITE_OTHER): Payer: PRIVATE HEALTH INSURANCE | Admitting: Family Medicine

## 2010-06-22 ENCOUNTER — Encounter: Payer: Self-pay | Admitting: Family Medicine

## 2010-06-22 DIAGNOSIS — M5430 Sciatica, unspecified side: Secondary | ICD-10-CM

## 2010-06-23 NOTE — Miscellaneous (Signed)
Summary: ROI  ROI   Imported By: Bradly Bienenstock 05/20/2010 12:18:42  _____________________________________________________________________  External Attachment:    Type:   Image     Comment:   External Document

## 2010-06-23 NOTE — Progress Notes (Signed)
Summary: Triage call  Phone Note Call from Patient   Summary of Call: Pt with N&V x two days.  Unable to keep anything on her stomach.  Spoke with Dr. Earlene Plater who prescribed Phenergan supp.  Called pt to advise of this and also told her if still no better by noon tomorrow then she needs to be seen - concerend about dehydration.  Pt agreeable and will call if not better. Initial call taken by: Dennison Nancy RN,  June 16, 2010 10:58 AM    New/Updated Medications: PROMETHAZINE HCL 25 MG SUPP (PROMETHAZINE HCL) one per rectum q 6 hours as needed for nausea Prescriptions: PROMETHAZINE HCL 25 MG SUPP (PROMETHAZINE HCL) one per rectum q 6 hours as needed for nausea  #12 x 0   Entered and Authorized by:   Helane Rima DO   Signed by:   Helane Rima DO on 06/16/2010   Method used:   Electronically to        [facility] [phone]* (retail)       [address]       [place], [place]  [address]       Ph: [phone]       Fax: [phone]   RxID:   [id number]

## 2010-06-23 NOTE — Assessment & Plan Note (Signed)
Summary: leg pain,df   Vital Signs:  Patient profile:   60 year old female Height:      63 inches Weight:      149 pounds BMI:     26.49 Temp:     98.4 degrees F oral Pulse rate:   80 / minute BP sitting:   95 / 61  (left arm) Cuff size:   regular  Vitals Entered By: Tessie Fass CMA (June 13, 2010 9:35 AM) CC: left leg pain Pain Assessment Patient in pain? yes     Location: left leg Intensity: 10   Primary Care Provider:  Helane Rima DO  CC:  left leg pain.  History of Present Illness: 60 yo F:  1. Sciatica secondary to foraminal stenosis and spondylolisthesis. MRI findings significant for L5-S1 spondylolisthesis and mild L4 foraminal stenosis at L4-L5.  The patient's case was discussed with surgery while patient hospitalized, and determined she is a surgical candidate; however, the patient declined surgery, instead the patient agreed with maximizing pain control and advancing physical therapy as tolerated.    A HH Safety Evaluation has been completed. Her pain HAS BEEN fairly controlled on her current regimen, though she says that is never better that 6/10. Function improved 30% with PT/OT, however, shewas discharged 2/2 lack of progress. Still using a walker.   Patient has upcoming appointment [facility] - [facility]17/10 at 11:00 am with Dr. Wynn Banker.  Now, c/o increased low back pain and radiculopathy down left leg, tactile hyperesthesia left shin and foot, and dragging foot. No bowel/bladder incontinence.     Current Medications (verified): 1)  Ms Contin 30 Mg Xr12h-Tab (Morphine Sulfate) .... One By Mouth Bid 2)  Lisinopril-Hydrochlorothiazide 20-25 Mg Tabs (Lisinopril-Hydrochlorothiazide) .... 1/2 Tablet By Mouth Daily For High Blood Pressure 3)  Ventolin Hfa 108 (90 Base) Mcg/act Aers (Albuterol Sulfate) .Marland Kitchen.. 1-2 Puffs Inhaled Q4h As Needed For Wheezing 4)  Morphine Sulfate 15 Mg Tabs (Morphine Sulfate) .... One By Mouth Q 4 Hours As Needed Pain 5)   Neurontin 800 Mg Tabs (Gabapentin) .... One By Mouth Tid 6)  Naprosyn 500 Mg Tabs (Naproxen) .... One By Mouth Tid 7)  Citracal Calcium+d 600-40-500 Mg-Mg-Unit Xr24h-Tab (Calcium-Magnesium-Vitamin D) .... One By Mouth Two Times A Day 8)  Prednisone 20 Mg Tabs (Prednisone) .... 3 X 3 Days, Then 2 X 3 Days, Then 1 X 2 Days, Then Off  Allergies (verified): No Known Drug Allergies PMH-FH-SH reviewed for relevance  Review of Systems      See HPI  Physical Exam  General:  Well-developed,well-nourished, in no acute distress; alert, appropriate and cooperative throughout examination. Vitals reviewed. Msk:  Tender to palpation in lower lumbar paraspinal muscles. Decreased ROM in extension > flexion due to pain. + SLR on right. Tactile hyperesthesia left shin and foot. Difficult to obtain DTR on left 2/2 patient gaurding, though seems decreased. Decreased plantarflexion/dorsiflexion strength. Pulses:  R and L dorsalis pedis and posterior tibial pulses are full and equal bilaterally. Extremities:  No edema. Neurologic:  See MSK.   Impression & Recommendations:  Problem # 1:  SCIATICA, LEFT (ICD-724.3) Assessment Deteriorated Now with new concerning symptoms. Sending to Neurosurgeon ASAP. Steroid burst today. Precepted with Dr. Jennette Kettle. Her updated medication list for this problem includes:    Ms Contin 30 Mg Xr12h-tab (Morphine sulfate) ..... One by mouth bid    Morphine Sulfate 15 Mg Tabs (Morphine sulfate) ..... One by mouth q 4 hours as needed pain    Naprosyn 500 Mg  Tabs (Naproxen) ..... One by mouth tid  Orders: Neurosurgeon Referral (Neurosurgeon) [facility]- Est Level  3 ([address])  Complete Medication List: 1)  Ms Contin 30 Mg Xr12h-tab (Morphine sulfate) .... One by mouth bid 2)  Lisinopril-hydrochlorothiazide 20-25 Mg Tabs (Lisinopril-hydrochlorothiazide) .... 1/2 tablet by mouth daily for high blood pressure 3)  Ventolin Hfa 108 (90 Base) Mcg/act Aers (Albuterol sulfate) .Marland Kitchen.. 1-2 puffs  inhaled q4h as needed for wheezing 4)  Morphine Sulfate 15 Mg Tabs (Morphine sulfate) .... One by mouth q 4 hours as needed pain 5)  Neurontin 800 Mg Tabs (Gabapentin) .... One by mouth tid 6)  Naprosyn 500 Mg Tabs (Naproxen) .... One by mouth tid 7)  Citracal Calcium+d 600-40-500 Mg-mg-unit Xr24h-tab (Calcium-magnesium-vitamin d) .... One by mouth two times a day 8)  Prednisone 20 Mg Tabs (Prednisone) .... 3 x 3 days, then 2 x 3 days, then 1 x 2 days, then off  Patient Instructions: 1)  We are sending you to a neurosurgeon. Prescriptions: PREDNISONE 20 MG TABS (PREDNISONE) 3 x 3 days, then 2 x 3 days, then 1 x 2 days, then off  #17 x 0   Entered and Authorized by:   Helane Rima DO   Signed by:   Helane Rima DO on 06/13/2010   Method used:   Electronically to        [facility] [phone]* (retail)       [address]       [place], [place]  [address]       Ph: [phone]       Fax: [phone]   RxID:   [id number]    Orders Added: 1)  Neurosurgeon Referral [Neurosurgeon] 2)  [facility]- Est Level  3 [[address]]

## 2010-06-23 NOTE — Letter (Signed)
Summary: Generic Letter  [facility]  [address]   [place], [place] [address]   Phone: [phone]  Fax: [phone]    05/11/2010  [name] [address] [place], [place]  [address]  Re: Ms. FULLAM,  I recommend that this patient continue to be out of work until 07/21/10 due to her medical condition.  Sincerely,   Helane Rima DO

## 2010-06-23 NOTE — Progress Notes (Signed)
  Phone Note Call from Patient   Caller: Patient Call For: [phone] Summary of Call: [name] need you to give specifics of date when she can return to work when you fax back the STD information.  The will dicontinue her disabiity payments  on 12/30 if not indicated .   Fax # [phone].  Attn: Claims Dept-  Claim# [id number].  Don't have to send medical stuff just that she will not be returning until March. Initial call taken by: Abundio Miu,  May 10, 2010 2:36 PM  Follow-up for Phone Call        Letter complete. Putting in fax pile with this note. Follow-up by: Helane Rima DO,  May 11, 2010 10:39 AM

## 2010-06-23 NOTE — Progress Notes (Signed)
Summary: Rx, pt out  Phone Note Refill Request   Refills Requested: Medication #1:  VENTOLIN HFA 108 (90 BASE) MCG/ACT AERS 1-2 puffs inhaled q4h as needed for wheezing Initial call taken by: Knox Royalty,  May 20, 2010 10:08 AM  Follow-up for Phone Call        patient has refills left at pharmacy. called and left message that rx will be ready to pick up. Follow-up by: Theresia Lo RN,  May 20, 2010 11:59 AM

## 2010-06-23 NOTE — Miscellaneous (Signed)
Summary: Sent medical records   Clinical Lists Changes Sent medic records to [employer] from 10/11 to present. [employer] [place]
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
## 2010-06-23 NOTE — Progress Notes (Signed)
Summary: ROI  ROI   Imported By: De Nurse 06/02/2010 16:42:21  _____________________________________________________________________  External Attachment:    Type:   Image     Comment:   External Document

## 2010-06-29 NOTE — Progress Notes (Signed)
Summary: re: appt/TS  Phone Note Call from Patient Call back at Home Phone [phone]   Reason for Call: Talk to Doctor Summary of Call: pt could not be seen at [facility] b/c they were needing over $200 up front, pt did not have the money so she is applying for medicaid & will try to be seen after she gets a card, pt is still  having same sypmtoms, wants to know if MD still wants her to be seen here on Wed since she did not get to go to her referral appt? Initial call taken by: Knox Royalty,  June 20, 2010 10:08 AM  Follow-up for Phone Call        called pt and lmvm to keep appt with dr.wallace. Follow-up by: Arlyss Repress CMA,,  June 20, 2010 11:53 AM

## 2010-06-29 NOTE — Assessment & Plan Note (Signed)
Summary: f/u,df   Vital Signs:  Patient profile:   60 year old female Height:      63 inches Weight:      147 pounds BMI:     26.13 Temp:     98.2 degrees F oral Pulse rate:   90 / minute BP sitting:   125 / 76  (left arm) Cuff size:   regular  Vitals Entered By: Tessie Fass CMA (June 22, 2010 9:54 AM) CC: lower back and left leg pain Pain Assessment Patient in pain? yes     Location: lower back, left leg Intensity: 10   Primary Care Provider:  Helane Rima DO  CC:  lower back and left leg pain.  History of Present Illness: 60 yo F:  1. Sciatica secondary to foraminal stenosis and spondylolisthesis. MRI findings significant for L5-S1 spondylolisthesis and mild L4 foraminal stenosis at L4-L5.  The patient's case was discussed with surgery while patient hospitalized, and determined she is a surgical candidate; however, the patient declined surgery, instead the patient agreed with maximizing pain control and advancing physical therapy as tolerated.    A HH Safety Evaluation has been completed. Her pain HAS BEEN fairly controlled on her current regimen, though she says that is never better that 6/10. Function improved 30% with PT/OT, however, shewas discharged 2/2 lack of progress. Still using a walker.   As of last week, patient c/o increased low back pain and radiculopathy down left leg, tactile hyperesthesia left shin and foot, and dragging foot. No bowel/bladder incontinence.  Patient was referred to Neurosurgeon last week (see previous note). She also had upcoming appointment with Pain Clinic. However, she states that she cannot afford her deductable now, so she canceled both appointments.     Current Medications (verified): 1)  Ms Contin 30 Mg Xr12h-Tab (Morphine Sulfate) .... One By Mouth Bid 2)  Lisinopril-Hydrochlorothiazide 20-25 Mg Tabs (Lisinopril-Hydrochlorothiazide) .... 1/2 Tablet By Mouth Daily For High Blood Pressure 3)  Ventolin Hfa 108 (90 Base)  Mcg/act Aers (Albuterol Sulfate) .Marland Kitchen.. 1-2 Puffs Inhaled Q4h As Needed For Wheezing 4)  Morphine Sulfate 15 Mg Tabs (Morphine Sulfate) .... One By Mouth Q 4 Hours As Needed Pain 5)  Neurontin 800 Mg Tabs (Gabapentin) .... One By Mouth Tid 6)  Naprosyn 500 Mg Tabs (Naproxen) .... One By Mouth Tid 7)  Citracal Calcium+d 600-40-500 Mg-Mg-Unit Xr24h-Tab (Calcium-Magnesium-Vitamin D) .... One By Mouth Two Times A Day 8)  Prednisone 20 Mg Tabs (Prednisone) .... 3 X 3 Days, Then 2 X 3 Days, Then 1 X 2 Days, Then Off 9)  Promethazine Hcl 25 Mg Supp (Promethazine Hcl) .... One Per Rectum Q 6 Hours As Needed For Nausea  Allergies (verified): No Known Drug Allergies PMH-FH-SH reviewed for relevance  Review of Systems      See HPI  Physical Exam  General:  Well-developed,well-nourished, in no acute distress; alert, appropriate and cooperative throughout examination. Vitals reviewed. Msk:  Tender to palpation in lower lumbar paraspinal muscles. Decreased ROM in extension > flexion due to pain. + SLR on right. Tactile hyperesthesia left shin and foot. Difficult to obtain DTR on left 2/2 patient gaurding, though seems decreased. Decreased plantarflexion/dorsiflexion strength. Pulses:  R and L dorsalis pedis and posterior tibial pulses are full and equal bilaterally. Extremities:  No edema. Neurologic:  See MSK.   Impression & Recommendations:  Problem # 1:  SCIATICA, LEFT (ICD-724.3) Assessment Deteriorated  Patient stable today, but with concerning symptoms. Advised patient to go to ED if  her pain is uncontrollable. Plan - obtain new MRI and consult Neurosurgery. In the meantime, we will see if we can get her to [facility] and have her talk to [facility]. RED FLAGS DISCUSSED AT LENGTH. Her updated medication list for this problem includes:    Ms Contin 30 Mg Xr12h-tab (Morphine sulfate) ..... One by mouth bid    Morphine Sulfate 15 Mg Tabs (Morphine sulfate) ..... One by mouth q 4 hours as needed  pain    Naprosyn 500 Mg Tabs (Naproxen) ..... One by mouth tid  Orders: [facility]- Est Level  3 (11914)  Complete Medication List: 1)  Ms Contin 30 Mg Xr12h-tab (Morphine sulfate) .... One by mouth bid 2)  Lisinopril-hydrochlorothiazide 20-25 Mg Tabs (Lisinopril-hydrochlorothiazide) .... 1/2 tablet by mouth daily for high blood pressure 3)  Ventolin Hfa 108 (90 Base) Mcg/act Aers (Albuterol sulfate) .Marland Kitchen.. 1-2 puffs inhaled q4h as needed for wheezing 4)  Morphine Sulfate 15 Mg Tabs (Morphine sulfate) .... One by mouth q 4 hours as needed pain 5)  Neurontin 800 Mg Tabs (Gabapentin) .... One by mouth tid 6)  Naprosyn 500 Mg Tabs (Naproxen) .... One by mouth tid 7)  Citracal Calcium+d 600-40-500 Mg-mg-unit Xr24h-tab (Calcium-magnesium-vitamin d) .... One by mouth two times a day 8)  Prednisone 20 Mg Tabs (Prednisone) .... 3 x 3 days, then 2 x 3 days, then 1 x 2 days, then off 9)  Promethazine Hcl 25 Mg Supp (Promethazine hcl) .... One per rectum q 6 hours as needed for nausea  Patient Instructions: 1)  If your pain is uncontrollable, please go to the emergency department.  Prescriptions: NEURONTIN 800 MG TABS (GABAPENTIN) one by mouth TID  #90 x 1   Entered and Authorized by:   Helane Rima DO   Signed by:   Helane Rima DO on 06/22/2010   Method used:   Print then Give to Patient   RxID:   [id number] MORPHINE SULFATE 15 MG TABS (MORPHINE SULFATE) one by mouth q 4 hours as needed pain  #120 x 0   Entered and Authorized by:   Helane Rima DO   Signed by:   Helane Rima DO on 06/22/2010   Method used:   Print then Give to Patient   RxID:   [id number] MS CONTIN 30 MG XR12H-TAB (MORPHINE SULFATE) one by mouth BID  #60 x 0   Entered and Authorized by:   Helane Rima DO   Signed by:   Helane Rima DO on 06/22/2010   Method used:   Print then Give to Patient   RxID:   [id number]    Orders Added: 1)  [facility]- Est Level  3 [59563]

## 2010-07-29 ENCOUNTER — Other Ambulatory Visit: Payer: Self-pay | Admitting: Family Medicine

## 2010-07-29 ENCOUNTER — Telehealth: Payer: Self-pay | Admitting: Family Medicine

## 2010-07-29 MED ORDER — MORPHINE SULFATE 15 MG PO TABS: 15.0000 mg | ORAL_TABLET | ORAL | Status: DC | PRN

## 2010-07-29 MED ORDER — MORPHINE SULFATE CR 30 MG PO TB12: 30.0000 mg | ORAL_TABLET | Freq: Two times a day (BID) | ORAL | Status: DC

## 2010-07-29 NOTE — Telephone Encounter (Signed)
Ready and put in 'to be called' box.

## 2010-07-29 NOTE — Telephone Encounter (Signed)
Pt will be out of her morphine today - pls call when ready Has appt next Wednesday

## 2010-08-03 ENCOUNTER — Ambulatory Visit (INDEPENDENT_AMBULATORY_CARE_PROVIDER_SITE_OTHER): Payer: Self-pay | Admitting: Family Medicine

## 2010-08-03 ENCOUNTER — Encounter: Payer: Self-pay | Admitting: Family Medicine

## 2010-08-03 VITALS — BP 106/72 | HR 90 | Temp 98.9°F | Ht 64.0 in | Wt 147.8 lb

## 2010-08-03 DIAGNOSIS — R630 Anorexia: Secondary | ICD-10-CM

## 2010-08-03 DIAGNOSIS — M5430 Sciatica, unspecified side: Secondary | ICD-10-CM

## 2010-08-03 LAB — BASIC METABOLIC PANEL
BUN: 7 mg/dL (ref 6–23)
CO2: 24 mEq/L (ref 19–32)
Calcium: 9.5 mg/dL (ref 8.4–10.5)
GFR calc non Af Amer: >60 mL/min (ref >60)
Glucose, Bld: 104 mg/dL — ABNORMAL HIGH (ref 70–99)
Sodium: 139 mEq/L (ref 135–145)

## 2010-08-03 LAB — SEDIMENTATION RATE: Sed Rate: 32 mm/hr — ABNORMAL HIGH (ref 0–22)

## 2010-08-03 MED ORDER — MIRTAZAPINE 15 MG PO TABS: 15.0000 mg | ORAL_TABLET | Freq: Every day | ORAL | Status: DC

## 2010-08-03 NOTE — Assessment & Plan Note (Signed)
No red flags. Rx Remeron trial.

## 2010-08-03 NOTE — Patient Instructions (Signed)
If your pain is uncontrollable or if you have any of those Red Flags that we discussed, please go to the Emergency Department.

## 2010-08-03 NOTE — Assessment & Plan Note (Signed)
Exam improved since last visit. Patient unable to afford long-acting Morphine - $75. Okay with short-acting for now. Reviewed RED FLAGS for patient to call or go to ED. Patient WILL NEED Neurosurgery evaluation when possible.

## 2010-08-03 NOTE — Progress Notes (Signed)
  Subjective:    Patient ID: Raven [name], female    DOB: [birth year], 60 y.o.   MRN: [id number]  HPI  1. 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 The patient's case was discussed with surgery while patient hospitalized, and determined she is a surgical candidate; however, the patient declined surgery, instead the patient agreed with maximizing pain control and advancing physical therapy as tolerated.  A HH Safety Evaluation has been completed. Her pain has been fairly controlled on her current regimen. Function improved 30% with PT/OT, however, shewas discharged 2/2 lack of progress. Still using a walker. Pain is usually in the low back pain with radiculopathy down left leg, tactile hyperesthesia left shin and foot. No bowel/bladder incontinence. Patient was referred to Neurosurgeon previously but cannot afford to go at this time. She is working on [employer].  2. Anorexia/Weight Loss: The patient endorses a > 10 pound weight loss over several months 2/2 no appetite. She can eat if she forces herself. No N/V/D/C, night sweats, food aversion. She is eating once daily at this time. Denies depression s/s, though admits that dealing with her chronic pain can be emotionally tough.  Review of Systems See HPI.    Objective:   Physical Exam General:  Well-developed,well-nourished, in no acute distress; alert, appropriate and cooperative throughout examination. Vitals reviewed. Msk:  Tender to palpation in lower lumbar paraspinal muscles. Decreased ROM in extension > flexion due to pain. + SLR on left, but improved from last OV. Tactile hyperesthesia left shin and foot. Difficult to obtain DTR on left 2/2 patient gaurding, though seems decreased. Decreased dorsiflexion strength. Pulses:  R and L dorsalis pedis and posterior tibial pulses are full and equal bilaterally. Extremities:  No  edema. Neurologic:  See MSK.    Assessment & Plan:

## 2010-08-05 ENCOUNTER — Observation Stay (HOSPITAL_COMMUNITY)
Admission: EM | Admit: 2010-08-05 | Discharge: 2010-08-05 | Disposition: A | Discharge status: Home / Self Care | Payer: Self-pay | Attending: Emergency Medicine | Admitting: Emergency Medicine

## 2010-08-05 DIAGNOSIS — F17200 Nicotine dependence, unspecified, uncomplicated: Secondary | ICD-10-CM | POA: Insufficient documentation

## 2010-08-05 DIAGNOSIS — E860 Dehydration: Secondary | ICD-10-CM | POA: Insufficient documentation

## 2010-08-05 DIAGNOSIS — I10 Essential (primary) hypertension: Secondary | ICD-10-CM | POA: Insufficient documentation

## 2010-08-05 DIAGNOSIS — R112 Nausea with vomiting, unspecified: Principal | ICD-10-CM | POA: Insufficient documentation

## 2010-08-05 DIAGNOSIS — R197 Diarrhea, unspecified: Secondary | ICD-10-CM | POA: Insufficient documentation

## 2010-08-05 DIAGNOSIS — J45909 Unspecified asthma, uncomplicated: Secondary | ICD-10-CM | POA: Insufficient documentation

## 2010-08-05 DIAGNOSIS — R7309 Other abnormal glucose: Secondary | ICD-10-CM | POA: Insufficient documentation

## 2010-08-05 DIAGNOSIS — R059 Cough, unspecified: Secondary | ICD-10-CM | POA: Insufficient documentation

## 2010-08-05 DIAGNOSIS — Z79899 Other long term (current) drug therapy: Secondary | ICD-10-CM | POA: Insufficient documentation

## 2010-08-05 DIAGNOSIS — R05 Cough: Secondary | ICD-10-CM | POA: Insufficient documentation

## 2010-08-05 LAB — POCT I-STAT, CHEM 8
Calcium, Ion: 1.11 mmol/L — ABNORMAL LOW (ref 1.12–1.32)
Glucose, Bld: 140 mg/dL — ABNORMAL HIGH (ref 70–99)
HCT: 53 % — ABNORMAL HIGH (ref 36.0–46.0)
Hemoglobin: 18 g/dL — ABNORMAL HIGH (ref 12.0–15.0)
Potassium: 4 mEq/L (ref 3.5–5.1)
TCO2: 20 mmol/L (ref 0–100)

## 2010-08-06 ENCOUNTER — Emergency Department (HOSPITAL_COMMUNITY): Payer: Self-pay

## 2010-08-06 ENCOUNTER — Encounter: Payer: Self-pay | Admitting: Family Medicine

## 2010-08-06 ENCOUNTER — Observation Stay (HOSPITAL_COMMUNITY)
Admission: EM | Admit: 2010-08-06 | Discharge: 2010-08-07 | Disposition: A | Discharge status: Home / Self Care | Payer: Self-pay | Attending: Family Medicine | Admitting: Family Medicine

## 2010-08-06 DIAGNOSIS — M47817 Spondylosis without myelopathy or radiculopathy, lumbosacral region: Secondary | ICD-10-CM | POA: Insufficient documentation

## 2010-08-06 DIAGNOSIS — R1013 Epigastric pain: Secondary | ICD-10-CM | POA: Insufficient documentation

## 2010-08-06 DIAGNOSIS — E785 Hyperlipidemia, unspecified: Secondary | ICD-10-CM | POA: Insufficient documentation

## 2010-08-06 DIAGNOSIS — F1010 Alcohol abuse, uncomplicated: Secondary | ICD-10-CM | POA: Insufficient documentation

## 2010-08-06 DIAGNOSIS — F17200 Nicotine dependence, unspecified, uncomplicated: Secondary | ICD-10-CM | POA: Insufficient documentation

## 2010-08-06 DIAGNOSIS — G8929 Other chronic pain: Secondary | ICD-10-CM | POA: Insufficient documentation

## 2010-08-06 DIAGNOSIS — R112 Nausea with vomiting, unspecified: Secondary | ICD-10-CM

## 2010-08-06 DIAGNOSIS — J45909 Unspecified asthma, uncomplicated: Secondary | ICD-10-CM | POA: Insufficient documentation

## 2010-08-06 DIAGNOSIS — K5289 Other specified noninfective gastroenteritis and colitis: Principal | ICD-10-CM | POA: Insufficient documentation

## 2010-08-06 DIAGNOSIS — A088 Other specified intestinal infections: Secondary | ICD-10-CM

## 2010-08-06 DIAGNOSIS — I10 Essential (primary) hypertension: Secondary | ICD-10-CM | POA: Insufficient documentation

## 2010-08-06 LAB — RAPID URINE DRUG SCREEN, HOSP PERFORMED
Cocaine: NOT DETECTED
Opiates: POSITIVE — AB
Tetrahydrocannabinol: POSITIVE — AB

## 2010-08-06 LAB — HEPATIC FUNCTION PANEL
ALT: 32 U/L (ref 0–35)
AST: 37 U/L (ref 0–37)
Bilirubin, Direct: 0.1 mg/dL (ref 0.0–0.3)
Indirect Bilirubin: 0.5 mg/dL (ref 0.3–0.9)
Total Bilirubin: 0.6 mg/dL (ref 0.3–1.2)

## 2010-08-06 LAB — LIPASE, BLOOD: Lipase: 86 U/L — ABNORMAL HIGH (ref 11–59)
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
## 2010-08-07 LAB — COMPREHENSIVE METABOLIC PANEL WITH GFR
ALT: 23 U/L (ref 0–35)
Alkaline Phosphatase: 68 U/L (ref 39–117)
BUN: 6 mg/dL (ref 6–23)
CO2: 23 meq/L (ref 19–32)
Chloride: 111 meq/L (ref 96–112)
Glucose, Bld: 118 mg/dL — ABNORMAL HIGH (ref 70–99)
Potassium: 3.4 meq/L — ABNORMAL LOW (ref 3.5–5.1)
Total Bilirubin: 0.4 mg/dL (ref 0.3–1.2)

## 2010-08-07 LAB — COMPREHENSIVE METABOLIC PANEL
AST: 24 U/L (ref 0–37)
Albumin: 3.4 g/dL — ABNORMAL LOW (ref 3.5–5.2)
Calcium: 8.7 mg/dL (ref 8.4–10.5)
Creatinine, Ser: 0.83 mg/dL (ref 0.4–1.2)
GFR calc Af Amer: >60 mL/min (ref >60)
GFR calc non Af Amer: >60 mL/min (ref >60)
Sodium: 137 mEq/L (ref 135–145)
Total Protein: 6.6 g/dL (ref 6.0–8.3)

## 2010-08-07 LAB — DIFFERENTIAL
Basophils Absolute: 0 K/uL (ref 0.0–0.1)
Basophils Relative: 0 % (ref 0–1)
Eosinophils Absolute: 0.1 K/uL (ref 0.0–0.7)
Eosinophils Relative: 2 % (ref 0–5)
Lymphocytes Relative: 41 % (ref 12–46)
Lymphs Abs: 3.1 10*3/uL (ref 0.7–4.0)
Monocytes Absolute: 0.9 K/uL (ref 0.1–1.0)
Monocytes Relative: 12 % (ref 3–12)
Neutro Abs: 3.4 K/uL (ref 1.7–7.7)
Neutrophils Relative %: 45 % (ref 43–77)

## 2010-08-07 LAB — CBC
HCT: 34.8 % — ABNORMAL LOW (ref 36.0–46.0)
Hemoglobin: 11.7 g/dL — ABNORMAL LOW (ref 12.0–15.0)
MCH: 29 pg (ref 26.0–34.0)
MCHC: 33.6 g/dL (ref 30.0–36.0)
MCV: 86.4 fL (ref 78.0–100.0)
Platelets: 234 K/uL (ref 150–400)
RBC: 4.03 MIL/uL (ref 3.87–5.11)
RDW: 13.3 % (ref 11.5–15.5)
WBC: 7.6 10*3/uL (ref 4.0–10.5)

## 2010-08-07 LAB — LIPASE, BLOOD: Lipase: 34 U/L (ref 11–59)

## 2010-08-07 LAB — LIPID PANEL
Cholesterol: 160 mg/dL (ref 0–200)
HDL: 36 mg/dL — ABNORMAL LOW (ref >39)
LDL Cholesterol: 106 mg/dL — ABNORMAL HIGH (ref 0–99)
Total CHOL/HDL Ratio: 4.4 RATIO
Triglycerides: 92 mg/dL (ref <150)
VLDL: 18 mg/dL (ref 0–40)

## 2010-08-10 ENCOUNTER — Encounter: Payer: Self-pay | Admitting: Family Medicine

## 2010-08-10 ENCOUNTER — Ambulatory Visit (INDEPENDENT_AMBULATORY_CARE_PROVIDER_SITE_OTHER): Payer: Self-pay | Admitting: Family Medicine

## 2010-08-10 DIAGNOSIS — K5289 Other specified noninfective gastroenteritis and colitis: Secondary | ICD-10-CM

## 2010-08-10 DIAGNOSIS — K529 Noninfective gastroenteritis and colitis, unspecified: Secondary | ICD-10-CM | POA: Insufficient documentation

## 2010-08-10 NOTE — Progress Notes (Signed)
  Subjective:    Patient ID: Raven [name], female    DOB: June 09, 1950, 60 y.o.   MRN: [id number]  HPI  1. F/U Hospital Visit: Patient prescribed Remeron at last visit for concern of no appetite and weight loss. She states that she took the medication x 1 day and developed N/V/D. She presented to [facility] x 2 for this issue and was admitted overnight. She was rehydrated, given antiemetics, and her V/D resolved overnight. She was sent home with Phenergan. She states that she feels much better today and hasn't needed the Phenergan. She reports that her appetite has returned and she has been eating lots of [place] and fried foods without problems.   Review of Systems No HA, dizziness, CP, SOB, N/V/D, change in back pain - no bowel/bladder incontinence, no falls.    Objective:   Physical Exam  Constitutional: She appears well-developed and well-nourished. No distress.  Cardiovascular: Normal rate, regular rhythm and intact distal pulses.   Pulmonary/Chest: Effort normal and breath sounds normal.  Abdominal: Soft. Bowel sounds are normal. She exhibits no distension. There is no tenderness.  Neurological: She is alert.          Assessment & Plan:

## 2010-08-10 NOTE — Assessment & Plan Note (Signed)
Resolved. Patient to continue all current medications and will follow up for her back pain in 1 month.
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
## 2010-09-13 ENCOUNTER — Encounter: Payer: Self-pay | Admitting: Family Medicine

## 2010-09-13 ENCOUNTER — Ambulatory Visit (INDEPENDENT_AMBULATORY_CARE_PROVIDER_SITE_OTHER): Payer: Self-pay | Admitting: Family Medicine

## 2010-09-13 DIAGNOSIS — M5430 Sciatica, unspecified side: Secondary | ICD-10-CM

## 2010-09-13 DIAGNOSIS — R630 Anorexia: Secondary | ICD-10-CM

## 2010-09-13 MED ORDER — GABAPENTIN 800 MG PO TABS: 800.0000 mg | ORAL_TABLET | Freq: Three times a day (TID) | ORAL | Status: DC

## 2010-09-13 MED ORDER — MORPHINE SULFATE 15 MG PO TABS: 15.0000 mg | ORAL_TABLET | ORAL | Status: DC | PRN

## 2010-09-13 NOTE — Patient Instructions (Signed)
It was nice to see you today!  Call if you get any of the RED FLAGs that we talked about.  We will try to get you into see a Neurosurgeon and will call with that information.  Please look into [facility].

## 2010-09-14 ENCOUNTER — Encounter: Payer: Self-pay | Admitting: Family Medicine

## 2010-09-14 DIAGNOSIS — R630 Anorexia: Secondary | ICD-10-CM | POA: Insufficient documentation

## 2010-09-14 NOTE — Assessment & Plan Note (Signed)
Discussed strategies for eating regularly. Will follow. Continue Ensure.

## 2010-09-14 NOTE — Assessment & Plan Note (Signed)
SEE HPI. Continue current treatment. Reviewed RED FLAGs.

## 2010-09-14 NOTE — Progress Notes (Signed)
  Subjective:    Patient ID: Raven [name], female    DOB: 10-21-1950, 60 y.o.   MRN: [id number]  HPI   1. 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 The patient's case was discussed with surgery while patient hospitalized, and determined she is a surgical candidate; however, the patient declined surgery, instead the patient agreed with maximizing pain control and advancing physical therapy as tolerated.  A HH Safety Evaluation has been completed. Her pain has been fairly controlled on her current regimen. Function improved 30% with PT/OT, however, shewas discharged 2/2 lack of progress. Still using a walker. Pain is usually in the low back pain with radiculopathy down left leg, tactile hyperesthesia left shin and foot. No bowel/bladder incontinence. Patient was referred to Neurosurgeon previously but cannot afford to go at this time. She is working on [employer]. She is very aware of potential RED FLAGS.  2. Anorexia/Weight Loss: Returned - she was doing well for several weeks. She can eat if she forces herself. No N/V/D/C, night sweats, food aversion. She is eating once daily at this time. Denies depression s/s, though admits that dealing with her chronic pain can be emotionally tough. Remeron was tried, but she believes that it caused her recent GI issues (see last OV note).  Review of Systems  See HPI.    Objective:   Physical Exam  General:  Well-developed,well-nourished, in no acute distress; alert, appropriate and cooperative throughout examination. Vitals reviewed. Msk:  Tender to palpation in lower lumbar paraspinal muscles. Decreased ROM in extension > flexion due to pain. + SLR on left, but improved from last OV. Tactile hyperesthesia left shin and foot. Difficult to obtain DTR on left 2/2 patient gaurding, though seems decreased. Decreased dorsiflexion strength. Pulses:  R  and L dorsalis pedis and posterior tibial pulses are full and equal bilaterally. Extremities:  No edema. Neurologic:  See MSK.    Assessment & Plan:

## 2010-09-15 ENCOUNTER — Encounter: Payer: Self-pay | Admitting: Family Medicine

## 2010-09-15 DIAGNOSIS — M5430 Sciatica, unspecified side: Secondary | ICD-10-CM

## 2010-09-22 NOTE — Discharge Summary (Signed)
NAMECRISTYN, CROSSNO NO.:  [id number]  MEDICAL RECORD NO.:  [id number]           PATIENT TYPE:  E  LOCATION:  [facility]                         FACILITY:  [facility]  PHYSICIAN:  Ellin Mayhew, MD      DATE OF BIRTH:  1950-12-08  DATE OF ADMISSION:  08/06/2010 DATE OF DISCHARGE:                              DISCHARGE SUMMARY   PRIMARY CARE PROVIDER:  Helane Rima, MD at [facility].  REASON FOR ADMISSION:  Nausea/vomiting  DISCHARGE DIAGNOSES: 1. Gastroenteritis. 2. Sciatica secondary to spinal stenosis and spondylolisthesis. 3. Hypertension. 4. Hyperlipidemia. 5. Asthma.  MEDICATIONS AT DISCHARGE: 1. Phenergan 25 mg p.o. or PR q.6 h. as needed. 2. Gabapentin 600 mg 1 tablet by mouth 3 times a day. 3. Morphine sulfate 15 mg 1 tab by mouth q.4 h. p.r.n. 4. Morphine 30 mg SR tab by mouth q.12 h. 5. Naprosyn 500 mg 1 tablet by mouth 3 times a day as needed. 6. Albuterol 1-2 puffs inhaled q.4 h. as needed. 7. Zantac 150 mg 1 tab by mouth b.i.d.  DISCONTINUED MEDICATIONS:  Lisinopril/hydrochlorothiazide 20/25 one half tabs by mouth daily.  This is to be held until patient is able to tolerate p.o. intake consistently and no further nausea, vomiting.  BRIEF HOSPITAL COURSE:  The patient is a 60 year old female who was seen on Friday in the emergency department for nausea, vomiting and was sent home with prescription of Zofran.  She returned on Saturday.  Her nausea and vomiting continued.  She was unable to hold on p.o. and she was unable to afford the Zofran to help control her symptoms.  The patient was admitted overnight for observation given IV fluids, since the patient had not been able to tolerate p.o.'s x2-3 days. 1. Gastroenteritis.  Due to the most likely call for her nausea,     vomiting, her symptoms have now improved.  After admission, she had     no further nausea, vomiting episodes after midnight now tolerating     p.o. fluids.  She  continues to have some nausea, but no further     vomiting.  The patient given prescription for Phenergan p.r.n. p.o.     as needed for further symptoms.  The patient encouraged to continue     p.o. intake to avoid dehydration.  On admission, lipase was     elevated at 86.  On recheck this morning, they have discharged     lipase now within normal limits at 34, potassium slightly decreased     at 3.4, most likely secondary to vomiting repleted with 20 mEq of     potassium chloride prior to discharge.  Hemoglobin stable at 11.7.     Ultrasound of the abdomen that was done in the emergency department     on August 06, 2010, showed no evidence of gallstones, hydronephrosis     or other acute findings.  There was a 2.8 hyperechoic mass in the     anterior right hepatic lobe, most likely representing a benign     hemangioma.  Further nonemergent imaging characterization was  recommended with abdominal MRI without and with contrast as expert     exam.  This will be followed up by primary care physician in the     outpatient setting.  Also, a 6.8-cm benign-appearing left hepatic     lobe cyst was present. 2. Sciatica.  We will discharge the patient on a regular home med     regimen of Neurontin as well as her morphine 30 mg SR b.i.d. and     her 15 mg of morphine sulfate q.4 h. p.r.n. 3. Hypertension.  Blood pressure within normal limits during hospital     stay.  We will have the patient hold lisinopril/hydrochlorothiazide     one half tablet that she normally takes until she has no further     nausea and vomiting.  No vomiting since after midnight, but the     patient is directed to restart lisinopril once she has been able to     hold down liquids x24 hours.  The patient to follow up with PCP on     blood pressure issue. 4. Hyperlipidemia.  Recheck of lipid profile panel on admission to the     hospital, cholesterol 160, triglycerides 92, cholesterol HDL 36,     cholesterol LDL 106.  We  will have primary care physician follow up     on hyperlipidemia.  The patient will be within good control at this     time. 5. Asthma.  The patient to continue albuterol p.r.n. per home regimen.  DISCHARGE FOLLOWUP:  The patient is already scheduled to see Dr. Earlene Plater on August 10, 2010, at 10:15 a.m. at [facility].  The patient is discharged home in stable condition.  FOLLOWUP ISSUES AND RECOMMENDATIONS:  Dr. Earlene Plater will evaluate the patient on August 10, 2010, need to evaluate the patient's blood pressure and make sure she has restarted her lisinopril if her nausea, vomiting has been resolved.     Ellin Mayhew, MD     DC/MEDQ  D:  08/07/2010  T:  08/08/2010  Job:  [id number]  Electronically Signed by Ellin Mayhew  on 09/06/2010 02:25:32 PM Electronically Signed by Denny Levy MD on 09/22/2010 09:30:14 AM

## 2010-10-14 ENCOUNTER — Ambulatory Visit (INDEPENDENT_AMBULATORY_CARE_PROVIDER_SITE_OTHER): Payer: Self-pay | Admitting: Family Medicine

## 2010-10-14 ENCOUNTER — Encounter: Payer: Self-pay | Admitting: Family Medicine

## 2010-10-14 VITALS — BP 112/74 | HR 80 | Wt 148.0 lb

## 2010-10-14 DIAGNOSIS — M5430 Sciatica, unspecified side: Secondary | ICD-10-CM

## 2010-10-14 MED ORDER — OXYCODONE HCL 10 MG PO TABS: ORAL_TABLET | ORAL | Status: DC

## 2010-10-14 MED ORDER — ACETAMINOPHEN 500 MG PO TABS: 1000.0000 mg | ORAL_TABLET | Freq: Three times a day (TID) | ORAL | Status: DC

## 2010-10-14 MED ORDER — LISINOPRIL-HYDROCHLOROTHIAZIDE 20-25 MG PO TABS: ORAL_TABLET | ORAL | Status: DC

## 2010-10-14 MED ORDER — MORPHINE SULFATE 15 MG PO TABS: 15.0000 mg | ORAL_TABLET | ORAL | Status: DC | PRN

## 2010-10-14 NOTE — Progress Notes (Signed)
  Subjective:    Patient ID: Raven [name], female    DOB: 1951-04-06, 60 y.o.   MRN: [id number]  HPI   1. 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 The patient's case was discussed with surgery while patient hospitalized, and determined she is a surgical candidate; however, the patient declined surgery, instead the patient agreed with maximizing pain control and advancing physical therapy as tolerated.  A HH Safety Evaluation has been completed. Her pain has been fairly controlled on her current regimen. Function improved 30% with PT/OT, however, shewas discharged 2/2 lack of progress. Still using a walker. Pain is usually in the low back pain with radiculopathy down left leg, tactile hyperesthesia left shin and foot. No bowel/bladder incontinence. Patient was referred to Neurosurgeon previously but cannot afford to go at this time. She is working on [employer]. She is very aware of potential RED FLAGS.  Review of Systems  See HPI.    Objective:   Physical Exam  General:  Well-developed,well-nourished, in no acute distress; alert, appropriate and cooperative throughout examination. Vitals reviewed. Msk:  Tender to palpation in lower lumbar paraspinal muscles. Decreased ROM in extension > flexion due to pain. + SLR on left, but improved from last OV. Tactile hyperesthesia left shin and foot. Difficult to obtain DTR on left 2/2 patient gaurding, though seems decreased. Decreased dorsiflexion strength. Pulses:  R and L dorsalis pedis and posterior tibial pulses are full and equal bilaterally. Extremities:  No edema. Neurologic:  See MSK.    Assessment & Plan:

## 2010-10-14 NOTE — Assessment & Plan Note (Addendum)
See HPI. Adjusting pain medications today. Reviewed RED FLAGS.

## 2010-10-14 NOTE — Patient Instructions (Signed)
It was nice to see you today!  Call if you get any of the RED FLAGs that we talked about.

## 2010-10-17 ENCOUNTER — Encounter: Payer: Self-pay | Admitting: Family Medicine

## 2010-10-17 ENCOUNTER — Observation Stay (HOSPITAL_COMMUNITY)
Admission: EM | Admit: 2010-10-17 | Discharge: 2010-10-18 | Disposition: A | Discharge status: Home / Self Care | Payer: Self-pay | Attending: Family Medicine | Admitting: Family Medicine

## 2010-10-17 ENCOUNTER — Emergency Department (HOSPITAL_COMMUNITY): Payer: Self-pay

## 2010-10-17 DIAGNOSIS — E860 Dehydration: Principal | ICD-10-CM | POA: Insufficient documentation

## 2010-10-17 DIAGNOSIS — R7309 Other abnormal glucose: Secondary | ICD-10-CM | POA: Insufficient documentation

## 2010-10-17 DIAGNOSIS — J45909 Unspecified asthma, uncomplicated: Secondary | ICD-10-CM | POA: Insufficient documentation

## 2010-10-17 DIAGNOSIS — R197 Diarrhea, unspecified: Secondary | ICD-10-CM | POA: Insufficient documentation

## 2010-10-17 DIAGNOSIS — R112 Nausea with vomiting, unspecified: Secondary | ICD-10-CM | POA: Insufficient documentation

## 2010-10-17 DIAGNOSIS — K5289 Other specified noninfective gastroenteritis and colitis: Secondary | ICD-10-CM | POA: Insufficient documentation

## 2010-10-17 DIAGNOSIS — F17200 Nicotine dependence, unspecified, uncomplicated: Secondary | ICD-10-CM | POA: Insufficient documentation

## 2010-10-17 DIAGNOSIS — N179 Acute kidney failure, unspecified: Secondary | ICD-10-CM | POA: Insufficient documentation

## 2010-10-17 DIAGNOSIS — M48061 Spinal stenosis, lumbar region without neurogenic claudication: Secondary | ICD-10-CM | POA: Insufficient documentation

## 2010-10-17 DIAGNOSIS — M5430 Sciatica, unspecified side: Secondary | ICD-10-CM | POA: Insufficient documentation

## 2010-10-17 DIAGNOSIS — I10 Essential (primary) hypertension: Secondary | ICD-10-CM | POA: Insufficient documentation

## 2010-10-17 DIAGNOSIS — IMO0002 Reserved for concepts with insufficient information to code with codable children: Secondary | ICD-10-CM

## 2010-10-17 LAB — COMPREHENSIVE METABOLIC PANEL
ALT: 28 U/L (ref 0–35)
Alkaline Phosphatase: 93 U/L (ref 39–117)
CO2: 18 mEq/L — ABNORMAL LOW (ref 19–32)
Calcium: 10.7 mg/dL — ABNORMAL HIGH (ref 8.4–10.5)
GFR calc non Af Amer: 33 mL/min — ABNORMAL LOW (ref >60)
Glucose, Bld: 153 mg/dL — ABNORMAL HIGH (ref 70–99)
Sodium: 132 mEq/L — ABNORMAL LOW (ref 135–145)

## 2010-10-17 LAB — CBC
HCT: 45.2 % (ref 36.0–46.0)
Hemoglobin: 16.3 g/dL — ABNORMAL HIGH (ref 12.0–15.0)
MCHC: 36.1 g/dL — ABNORMAL HIGH (ref 30.0–36.0)
MCV: 86.3 fL (ref 78.0–100.0)

## 2010-10-17 LAB — LIPASE, BLOOD: Lipase: 43 U/L (ref 11–59)

## 2010-10-17 LAB — DIFFERENTIAL
Basophils Absolute: 0 10*3/uL (ref 0.0–0.1)
Lymphocytes Relative: 18 % (ref 12–46)
Lymphs Abs: 2.4 10*3/uL (ref 0.7–4.0)
Monocytes Absolute: 0.7 10*3/uL (ref 0.1–1.0)
Monocytes Relative: 5 % (ref 3–12)
Neutro Abs: 10.3 10*3/uL — ABNORMAL HIGH (ref 1.7–7.7)

## 2010-10-17 LAB — HEMOGLOBIN A1C
Hgb A1c MFr Bld: 5.1 % (ref <5.7)
Mean Plasma Glucose: 100 mg/dL (ref <117)

## 2010-10-17 LAB — URINALYSIS, ROUTINE W REFLEX MICROSCOPIC
Ketones, ur: 15 mg/dL — AB
Nitrite: NEGATIVE
Urobilinogen, UA: 0.2 mg/dL (ref 0.0–1.0)

## 2010-10-17 NOTE — Progress Notes (Signed)
Family Medicine Teaching [facility] Admission History and Physical  Patient name: Raven [name] Medical record number: [id number] Date of birth: 02/10/1951 Age: 60 y.o. Gender: female  Primary Care Provider: Helane Rima, DO  Chief Complaint: nausea/vomiting/diarrhea, worsening left leg and back pain  History of Present Illness: Raven [name] is a 60 y.o. year old female presenting with above symptoms.  Nausea started a couple of days ago on Friday but significantly worsened last night when she had several episodes of vomiting and diarrhea. Both were yellow-colored. Denies seeing blood in either. No recent antibiotic use. Last ate on Friday. Has also not drank anything significant since Friday. Denies sick contacts. Denies eating picnic foods, eating out, or eating anything out-of-the-ordinary. Also with some epigastric abdominal pain.  Lumbar back pain and left radiculopathic pain has worsened significantly over weekend. Pain is described as constant and throbbing. She last took her pain medications (oxycodone and gabapentin) on Friday. Did not take since then due to nausea, decreased PO intake, and not wanting to take analgesics on an empty stomach. The pain starts in the middle of her lower back, then radiates down the lateral left leg, and goes to the great toe. Pain is worsened with certain positions but unable to clarify which positions make worse. Similar degree of pain when standing, sitting, or lying. Denies numbness, although feels she has decreased sensation. Uses walker at home to help ambulate but feels she is unable to ambulate at this time. Is barely able to lift her leg. Denies stool or urine incontinence. Had been on Oxycontin scheduled in the past but has been unable to afford this medication.  Temperature 101 @ home yesterday. Positive chills.   Patient Active Problem List  Diagnoses  . HYPERLIPIDEMIA  . TOBACCO ABUSE  . HYPERTENSION, BENIGN SYSTEMIC  . ASTHMA,  INTERMITTENT  . SCIATICA, LEFT  . Anorexia   Past Medical History: Past Medical History  Diagnosis Date  . Hypertension   . Spinal stenosis of lumbar region   . Asthma   . Tobacco abuse   Menopause 1-2 years ago.   Past Surgical History: History of 2 C-sections. Patient is a G5P5.  Denies other surgeries including gallbladder, appendix, uterine.   Social History: Lives with husband and daughter in [place]. Smokes 1/4 ppd. Occasional beer drinker by has not drank alcohol since October 2011 secondary to her being on certain medications. Denies other drugs including cocaine, marijuana.   Family History: HTN, asthma. No history of diabetes, cancer (including colon cancer), GI disease.   Allergies: NKDA  Medications: Albuterol 1-2 puffs inhaled q4hrs prn wheezing.  Calcium-magnesium-vitamin D 600-40-500 po bid.  Gabapentin 800 mg tid. Lisinopril/hctz 10/12.5 mg po qd. Oxycodone 10-20mg  po tid prn pain.  Tylenol 1,000mg  po tid. (Patient was started on this medication Friday, but has not taken any due to nausea).   Review Of Systems: Per HPI with the following additions: occasional mild-moderate headaches, throbbing, left temporal to front, none currently, alleviated by naproxen; no vision changes; no dysuria. Last used albuterol on Friday about 3 times; changes in weather sets off her asthma; not feeling dypsneic currently.  Otherwise 12 point review of systems was performed and was unremarkable.  Physical Exam: Pulse: 68  Blood Pressure: 141/75 RR: 20   O2: 100 on RA Temp: 97.7  General: uncomfortable  HEENT: MMM, PERRLA, neck supple with full ROM Heart: RRR, normal S1/S2, no murmurs or gallops Lungs: no increased WOB, good aeration, CTAB  Abdomen: NABS, soft, moderate epigastric  TTP, no guarding; lower vertical incision scar  Extremities: 3-4 sec capillary refill; 2+ pedal pulses bilaterally; no edema Skin: warm, dry  Neurology:    Right leg: 5/5 strength in leg, feet,  and toes   Left leg: 3/5 strength; very TTP throughout legs, especially over shins; sensation intact; no erythema, warmth, or other lesions  Back: no CVA tenderness; TTP mid-line lumbar spine   Labs and Imaging: Lab Results  Component Value Date/Time   NA 132* 10/17/2010  7:59 AM   K 3.6 10/17/2010  7:59 AM   CL 95* 10/17/2010  7:59 AM   CO2 18* 10/17/2010  7:59 AM   BUN 26* 10/17/2010  7:59 AM   CREATININE 1.59* 10/17/2010  7:59 AM   GLUCOSE 153* 10/17/2010  7:59 AM   Lab Results  Component Value Date   WBC 13.4* 10/17/2010   HGB 16.3* 10/17/2010   HCT 45.2 10/17/2010   MCV 86.3 10/17/2010   PLT 329 10/17/2010   UA Color, Urine                             YELLOW            YELLOW  Appearance                               CLOUDY     a      CLEAR  Specific Gravity                         1.030             1.005-1.030  pH                                       5.0               5.0-8.0  Urine Glucose                            NEGATIVE          NEG              mg/dL  Bilirubin                                LARGE      a      NEG  Ketones                                  15         a      NEG              mg/dL  Blood                                    MODERATE   a      NEG  Protein                                  >  300       a      NEG              mg/dL  Urobilinogen                             0.2               0.0-1.0          mg/dL  Nitrite                                  NEGATIVE          NEG  Leukocytes                               TRACE      a      NEG  UMic: Squamous Epithelial / LPF                MANY       a      RARE  Casts / HPF                              SEE NOTE.  a      NEG    HYALINE CASTS    GRANULAR CAST  WBC / HPF                                0-2               <3               WBC/hpf  RBC / HPF                                0-2               <3               RBC/hpf  Bacteria / HPF                           FEW        a      RARE  Abd U/S: No acute  findings.  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  MRI 02/2010:  1.  Spondylolisthesis at L5-S1 with very severe facet arthropathy   resulting and mild to moderate spinal stenosis with left lateral   recess and left L5 foraminal stenosis.   2.  Multifactorial borderline spinal stenosis at L3-L4.   Multifactorial mild L4 foraminal stenosis at L4-L5.   3.  4-5 cm cystic lesion situated between the stomach and liver may   represent a large liver cyst or hemangioma.  Gastric diverticulum   or other origin felt less likely.  Assessment and Plan: Raven [name] is a 60 y.o. year old female presenting with nausea/vomiting/diarrhea with dehydration and worsening lower back and left lower extremity pain. Will admit to floor bed, observation status.  1. Nausea/vomiting and diarrhea with dehydration. Likely secondary to gastroenteritis. Will control nausea with prn Zofran 4-8mg  q6 hours and Phenergan 25mg  q6 hrs. Dehydration  appears to be improving based on physical exam, although urinalysis and initial tachycardia indicative of dehydration. Will allow patient to have sips of clears. Will continue fluids @ 250 mL/hour for the next several hours. Patient has received about 1 L of fluids currently since she has received fluids at this rate for about 4 hours. Do not think she needs a fluid bolus at this time based on physical exam and normal heart-rate. Will give high dose Protonix @ 20mg  IV bid. Will check C. diff and stool cultures. Will check HIV since this is patient's second severe episode of gastroenteritis in past few months (last hospitalized in March 2012 for the same).  2.   Severe spinal stenosis with worsened left lower extremity radiculopathic pain. Secondary to patient being unable to take her pain medications for the past 2-3 days due to her nausea/vomiting. Likely worsened pain is due to not taking analgesics rather than worsening of her spinal stenosis. No cauda equina symptoms. Will give IV pain medication, Dilaudid  2mg  q2hrs prn. Will Patient has had neurosurgery consults in the past, and they have recommended surgery for this issue. Patient does not want surgery at this time. Patient is trying to see outpatient specialist regarding this and is waiting for her disability/Medicaid to be approved so she can do this. Do not think neurosurgery needs to be consulted at this time. 3. FEN/GI: Sips of clears. NS c 20 KCl @ 250 cc/hour. Will add K secondary to low-normal K, decreased PO intake, and diarrhea.  4.   History of HTN. Will continue patient's home anti-hypertensive lisinopril/HCTZ 10/12.5 po qd. Will give hydralazine 10mg  IV q4hrs prn blood pressures >160/110 in case patient unable to take PO anti-hypertensive.  5.   Hyperglycemia. Will check HgbA1c. 6.   Tobacco abuse. Denies wanting nicotine patch. Will provide smoking cessation counseling.  7. Prophylaxis: Heparin SQ. Protonix.  8. Disposition: Pending clinical improvement.  9.   History of asthma. Will give home albuterol 1-2 puffs q4hrs prn wheezing/SOB.     Etta Quill. Madolyn Frieze, PGY-1 [phone]

## 2010-10-18 LAB — URINE CULTURE
Colony Count: NO GROWTH
Culture: NO GROWTH

## 2010-10-18 LAB — CBC
Platelets: 240 10*3/uL (ref 150–400)
RBC: 4.05 MIL/uL (ref 3.87–5.11)
WBC: 8 10*3/uL (ref 4.0–10.5)

## 2010-10-18 LAB — BASIC METABOLIC PANEL
Chloride: 107 mEq/L (ref 96–112)
Creatinine, Ser: 0.78 mg/dL (ref 0.4–1.2)
GFR calc Af Amer: >60 mL/min (ref >60)
Potassium: 4.3 mEq/L (ref 3.5–5.1)

## 2010-10-21 ENCOUNTER — Telehealth: Payer: Self-pay | Admitting: Family Medicine

## 2010-10-21 NOTE — Telephone Encounter (Signed)
Will fwd. To PCP 

## 2010-10-21 NOTE — Telephone Encounter (Signed)
Ms. Rodriges need you to increase the miligrams from 15 to higher dosage.  Cannot afford the Oxycodone rx that's 75.00.

## 2010-10-23 NOTE — H&P (Signed)
Raven [name], Raven [name]                ACCOUNT NO.:  [id number]  MEDICAL RECORD NO.:  [id number]           PATIENT TYPE:  I  LOCATION:  5529                         FACILITY:  [facility]  PHYSICIAN:  Davonna Ertl A. Sheffield Slider, M.D.    DATE OF BIRTH:  [birth year]  DATE OF ADMISSION:  10/17/2010 DATE OF DISCHARGE:                             HISTORY & PHYSICAL   PRIMARY CARE PROVIDER:  Helane Rima, MD at [facility].  CHIEF COMPLAINT:  Nausea/vomiting/diarrhea, worsening left leg and back pain.  HISTORY OF PRESENT ILLNESS:  Raven [name] is a 60 year old female presenting with above symptoms.  Nausea started couple of days ago on Friday, but significantly worsened last night when she had several episodes of vomiting and diarrhea.  Both are yellow in color.  Denies seeing blood in either.  No recent antibiotic use.  Last ate on Friday. Has not drank anything significant since Friday.  Denies sick contacts. Denies eating picnic foods, eating out, or eating anything out of the ordinary, also with some epigastric abdominal pain.  Lumbar back pain and left radiculopathic pain has worsened significantly over the weekend.  Pain is described as constant and throbbing.  She last took her pain medications (oxycodone, gabapentin) on Friday.  Did not take any since then due to her nausea, decreased p.o. intake, and not wanting to take analgesics on empty stomach.  The pain starts in the middle of her lower back, then radiates down the lateral left leg, and goes to the great toe.  Pain is worsened with certain positions, but unable to identify which position makes the pain worse.  Similar degree of pain when standing, sitting, or lying.  Denies numbness, although feel she has decreased sensation in her left lower extremity.  Uses walker at home to help ambulate, but feels she is unable to ambulate at this time and is barely able to lift her leg.  Denies stooling or urine incontinence.  Has  been on OxyContin scheduled in the past, has been unable to afford this medication.  Temperature 101 at home yesterday. Positive chills.  PAST MEDICAL HISTORY: 1. Hypertension. 2. Spinal stenosis of lumbar region. 3. Asthma. 4. Tobacco abuse. 5. Menopause 1-2 years ago.  PAST SURGICAL HISTORY:  History of two C-sections.  The patient is G5, P5.  Denies other surgeries including gallbladder, appendix, uterine surgeries.  SOCIAL HISTORY:  Lives with husband and daughter in [place].  She smokes quarter pack per day.  Occasional beer drinker, but has not drank alcohol since October 2011 secondary to her being uncertain medications. Denies other drugs including cocaine and marijuana.  FAMILY HISTORY:  Hypertension, asthma.  No history of diabetes or cancer including colon cancer or GI disease.  ALLERGIES:  No known drug allergies.  MEDICATIONS: 1. Albuterol one to two puffs inhaled q.4 h. p.r.n. wheezing. 2. Calcium, magnesium, vitamin D 600, 40, 500 p.o. b.i.d. 3. Gabapentin 800 mg t.i.d. 4. Lisinopril/hydrochlorothiazide 10/12.5 mg p.o. daily. 5. Oxycodone 10-20 mg p.o. t.i.d. p.r.n. pain. 6. Tylenol 1000 mg p.o. t.i.d.  (The patient was started on this     medication on  Friday Oct 14, 2010, but has not taken any due to     nausea).  REVIEW OF SYSTEMS:  Per HPI with the following additions:  Occasional mild-to-moderate headaches, throbbing, left temporal to frontal, none currently, alleviated by naproxen.  No vision changes.  No dysuria. Last used albuterol on Oct 14, 2010 about three times.  Changes in weather sets off her asthma.  Not feeling dyspneic currently.  PHYSICAL EXAMINATION:  VITAL SIGNS:  Heart rate 60, blood pressure 141/75, respiratory rate 20, O2 sat 100% on room air, temperature 97.7. GENERAL:  Uncomfortable. HEENT:  Moist mucous membranes.  Pupils are equal, round, react to light and accommodation. NECK:  Supple with full range of motion. HEART:   Regular rate and rhythm, normal S1-S2, no murmurs or gallops. LUNGS:  No increased work of breathing, good aeration, clear to auscultation bilaterally. ABDOMEN:  Normoactive bowel sounds, soft, moderate epigastric tenderness to palpation, no guarding, lower vertical incision scar. EXTREMITIES:  3-4 seconds capillary refill, 2+ pedal pulses bilaterally, no edema. SKIN:  Warm, dry. NEUROLOGY:  Right leg; 5/5 strength in leg, feet and toes.  Left leg; 3/5 strength, very tenderness to palpation throughout legs, especially over shin, sensation intact, no erythema, warmth or other lesions. BACK:  No CVA tenderness, tenderness to palpation in midline lumbar spine.  LABS AND IMAGING:  Sodium 132, potassium 3.6, chloride 95, CO2 18, BUN 26, creatinine 1.59, glucose 153.  CBC; white blood count 13.4, hemoglobin 16.3, platelets 329.  Urinalysis significant for large bilirubin, 15 ketones, moderate blood, greater than 300 protein, trace leukocyte.  Urine microscopic significant for many squamous epithelial cells, 0-2 white blood cells, few bacteria.  Abdominal x-ray; no abdominal ultrasound, no acute findings.  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  MRI in October 2011 significant for spondylolisthesis at L5-S1 with very severe facet arthropathy resulting in mild-to-moderate spinal stenosis with left lateral recess and left L5 foraminal stenosis.  ASSESSMENT AND PLAN:  Raven [name] is a 60 year old female presenting with nausea/vomiting/diarrhea with dehydration and worsening lower back and left lower extremity pain.  We will admit to floor bed, observation status. 1. Nausea/vomiting and diarrhea with dehydration.  Likely secondary to     gastroenteritis.  We will control nausea with p.r.n. Zofran 48 mg     IV q.6 h. and Phenergan 25 mg q.6 h.  Dehydration appears to be     improving based on physical examination, although urinalysis and     initial tachycardia indicative of dehydration.  We will allow the      patient to have sips of clears.  We will continue fluids at 250 mL     per hour for the next several hours.  The patient has received     about 1 liter of fluids.  Currently, she has received fluids at     this rate for about 4 hours.  Do not think she needs a fluid bolus     at this time based on physical exam and normal heart rate.  Will     get high-dosed Protonix at 20 mg IV b.i.d.  We will check C. diff     and stool cultures.  We will check HIV, this is the patient's     second severe episode of gastroenteritis in the past few months     (last hospitalized in March 2012 for the same). 2. Severe spinal stenosis with worsened left lower extremity     radiculopathic pain.  Secondary to the patient being unable to  take     her pain medications for the past 2-3 days due to her     nausea/vomiting.  Likely worsen pain is due to not taking     analgesics rather than worsening of her spinal stenosis.  No cauda     equina symptoms.  We will give IV pain medication, Dilaudid 2 mg     q.2 h. p.r.n.  The patient has had a Neurosurgery consult in the     past, and they have recommended surgery for this.  The patient does     not want surgery at this time.  The patient is trying to see     outpatient specialist regarding this and is waiting for her     disability/Medicaid to be approved that she cannot do this.  I do     not think, Neurosurgery needs to be consulted at this time. 3. Fluids, electrolytes, nutrition/gastrointestinal.  Sips of clears.     Normal saline with 20 KCl at 250 mL per hour.  We will add K     secondary to low normal potassium, decreased p.o. intake, and     diarrhea. 4. History of hypertension.  We will continue the patient's home     antihypertensive, lisinopril/hydrochlorothiazide 10/12.5 p.o.     daily.  We will give hydralazine 10 mg IV q.4 h. And blood pressure     is greater than 160/110, in case the patient is unable to take p.o.     antihypertensive. 5.  Hyperglycemia.  We will check hemoglobin A1c. 6. Tobacco abuse.  Denies morning nicotine patch.  We will provide     smoking cessation counseling. 7. Prophylaxis.  Heparin subcu.  Protonix. 8. Disposition.  Pending clinical improvement. 9. History of asthma.  We will give home albuterol one to two puffs     q.4 h. p.r.n. wheezing/shortness of breath.    ______________________________ Priscella Mann, MD   ______________________________ Arnette Norris. Sheffield Slider, M.D.    AO/MEDQ  D:  10/17/2010  T:  10/18/2010  Job:  [id number]  Electronically Signed by Priscella Mann MD on 10/19/2010 12:25:49 PM Electronically Signed by Zachery Dauer M.D. on 10/23/2010 06:33:59 PM

## 2010-10-24 ENCOUNTER — Telehealth: Payer: Self-pay | Admitting: *Deleted

## 2010-10-24 NOTE — Telephone Encounter (Signed)
Pt called back, will come on 6/27.

## 2010-10-24 NOTE — Telephone Encounter (Signed)
Please ask patient to come in for appointment since she is req higher dose and was recently in the hospital. I'm in clinic Wed and Thurs. Okay to double book if needed.

## 2010-10-24 NOTE — Telephone Encounter (Signed)
Called pt. No answer. Pt needs OV. See message. [name], [name]

## 2010-10-26 NOTE — Discharge Summary (Signed)
NAMEJARA, Raven [name] NO.:  [id number]  MEDICAL RECORD NO.:  [id number]           PATIENT TYPE:  I  LOCATION:  5529                         FACILITY:  [facility]  PHYSICIAN:  Leighton Roach McDiarmid, M.D.DATE OF BIRTH:  Sep 30, 1950  DATE OF ADMISSION:  10/17/2010 DATE OF DISCHARGE:  10/18/2010                              DISCHARGE SUMMARY   ATTENDING:  Leighton Roach McDiarmid, MD  PRIMARY CARE PROVIDER:  Helane Rima, MD, at [facility].  REASON FOR HOSPITALIZATION: 1. Dehydration from vomiting and diarrhea. 2. Worsening left lower extremity radiculopathic pain.  DISCHARGE DIAGNOSES: 1. Gastroenteritis. 2. Worsening of left lower extremity radiculopathic pain secondary to     inability to take oral pain medications. 3. Tobacco abuse. 4. Acute renal failure secondary to dehydration. 5. History of asthma.  MEDICATIONS:  New medications 1. Tylenol 650 mg p.o. q.8 h.  Continue home medications; 1. Gabapentin 600 mg p.o. t.i.d. 2. Morphine sulfate 15 mg p.o. q.4 h. p.r.n. breakthrough pain. 3. Naproxen 500 mg p.o. t.i.d. 4. Promethazine 25 mg p.o. q.6 h. P.r.n. 5. Albuterol inhaler 1-2 puffs inhale q.4 h. p.r.n. shortness of     breath. 6. Zantac 150 mg p.o. b.i.d.  CONSULTS:  None.  PROCEDURES:  X-ray, abdominal x-ray showed no acute findings.  PERTINENT LABORATORY FINDINGS:  HIV nonreactive.  Hemoglobin A1c 5.1, lipase 43.  BRIEF HOSPITAL COURSE:  This is a 60 year old female presenting with dehydration from vomiting and diarrhea from gastroenteritis and worsening left radiculopathic pain. 1. Gastroenteritis.  The patient presented with 3 days of nausea and     decreased p.o. intake and 2 days of vomiting and diarrhea.  She was     admitted and made n.p.o.  She was given fluids at 250 mL per hour.     Her vomiting was controlled with p.r.n. Zofran and Phenergan.  The     patient had no episodes of vomiting and diarrhea in the hospital.     Her nausea had improved by hospital day #2.  She tolerated     advancement of her diet.  C. diff and stool cultures were ordered,     however, were not done because she had no bowel movements in the     hospital.  Since this was her second episode of significant recent     gastroenteritis, HIV was checked which was negative.  The patient     was discharged home with p.r.n. Phenergan for her nausea. 2. Worsening left lower extremity radiculopathic pain.  The patient     had known left lower extremity radiculopathy from severe spinal     stenosis.  She is on oral morphine, naproxen, and gabapentin at     home for this.  She has been unable to take her medications for the     past 3 days secondary to nausea.  Her pain was controlled in the     hospital with p.r.n. IV Dilaudid initially.  She was started back     on her home medications on hospital day #2 since her nausea had     improved.  Neurosurgery has seen this patient in the past for her     spinal stenosis and has recommended surgery, which the patient does     not want at this time.  She is waiting for her disability/Medicaid     to be approved so that she can see a spine specialist as an     outpatient.  Since the patient does not want surgery at this time     and since her worsening pain was thought to be due to her being off     pain medications and not to worsening spinal stenosis, neurosurgery     was not consulted during the hospitalization.  The patient's pain     improved with IV and then with oral analgesics.  She was back to     her baseline at the time of discharge on her home medications.  The     patient was given Tylenol, gabapentin and naproxen for her baseline     pain with morphine immediate release for breakthrough.  The patient     has been on long-acting narcotics in the past, however, she stopped     taking this due to cost issues.  The patient's only complaint was     left lower extremity radiculopathic pain.   She did not have fevers     or cauda equina syndrome symptoms. 3. Acute renal failure secondary to dehydration.  Creatinine improved     from 1.59 to 0.78 with hydration. 4. Tobacco abuse.  Counseled.  No patch needed during hospitalization. 5. History of asthma.  Controlled on home albuterol inhaler p.r.n.  DISCHARGE INSTRUCTIONS:  The patient was asked to make a followup appointment with her primary care provider, Dr. Helane Rima in 1-2 weeks.  FOLLOWUP ISSUES: 1. Consider possible gastroparesis diagnosis if the patient continues     to have persistent dyspepsia. 2. Adequate pain control.  The patient unable to afford MS Contin.  DISPOSITION:  The patient was discharged to home in stable medical condition.    ______________________________ Priscella Mann, MD   ______________________________ Leighton Roach McDiarmid, M.D.    AO/MEDQ  D:  10/20/2010  T:  10/21/2010  Job:  [id number]  cc:   Helane Rima, MD  Electronically Signed by Priscella Mann MD on 10/22/2010 11:20:05 AM Electronically Signed by Acquanetta Belling M.D. on 10/26/2010 01:21:40 PM

## 2010-11-12 ENCOUNTER — Emergency Department (HOSPITAL_COMMUNITY): Payer: Self-pay

## 2010-11-12 ENCOUNTER — Encounter: Payer: Self-pay | Admitting: Family Medicine

## 2010-11-12 ENCOUNTER — Observation Stay (HOSPITAL_COMMUNITY)
Admission: EM | Admit: 2010-11-12 | Discharge: 2010-11-13 | Disposition: A | Discharge status: Home / Self Care | Payer: Self-pay | Attending: Family Medicine | Admitting: Family Medicine

## 2010-11-12 DIAGNOSIS — E785 Hyperlipidemia, unspecified: Secondary | ICD-10-CM

## 2010-11-12 DIAGNOSIS — G619 Inflammatory polyneuropathy, unspecified: Secondary | ICD-10-CM

## 2010-11-12 DIAGNOSIS — I10 Essential (primary) hypertension: Secondary | ICD-10-CM

## 2010-11-12 DIAGNOSIS — IMO0002 Reserved for concepts with insufficient information to code with codable children: Principal | ICD-10-CM | POA: Insufficient documentation

## 2010-11-12 DIAGNOSIS — F17200 Nicotine dependence, unspecified, uncomplicated: Secondary | ICD-10-CM | POA: Insufficient documentation

## 2010-11-12 DIAGNOSIS — M549 Dorsalgia, unspecified: Secondary | ICD-10-CM | POA: Insufficient documentation

## 2010-11-12 DIAGNOSIS — R110 Nausea: Secondary | ICD-10-CM | POA: Insufficient documentation

## 2010-11-12 DIAGNOSIS — G622 Polyneuropathy due to other toxic agents: Secondary | ICD-10-CM

## 2010-11-12 DIAGNOSIS — J45909 Unspecified asthma, uncomplicated: Secondary | ICD-10-CM | POA: Insufficient documentation

## 2010-11-12 LAB — CBC
HCT: 37.7 % (ref 36.0–46.0)
MCHC: 35 g/dL (ref 30.0–36.0)
Platelets: 287 10*3/uL (ref 150–400)
RDW: 13.7 % (ref 11.5–15.5)
WBC: 5.7 10*3/uL (ref 4.0–10.5)

## 2010-11-12 LAB — BASIC METABOLIC PANEL
BUN: 13 mg/dL (ref 6–23)
Creatinine, Ser: 0.6 mg/dL (ref 0.50–1.10)
GFR calc Af Amer: >60 mL/min (ref >60)
GFR calc non Af Amer: >60 mL/min (ref >60)
Potassium: 4.4 mEq/L (ref 3.5–5.1)

## 2010-11-12 LAB — DIFFERENTIAL
Basophils Absolute: 0 10*3/uL (ref 0.0–0.1)
Basophils Relative: 0 % (ref 0–1)
Eosinophils Absolute: 0.1 10*3/uL (ref 0.0–0.7)
Eosinophils Relative: 2 % (ref 0–5)
Lymphocytes Relative: 29 % (ref 12–46)
Monocytes Absolute: 0.4 10*3/uL (ref 0.1–1.0)

## 2010-11-12 NOTE — H&P (Signed)
Family Medicine Teaching [facility] Admission History and Physical  Patient name: BRANNON DECAIRE Medical record number: [id number] Date of birth: 01/07/1951 Age: 60 y.o. Gender: female  Primary Care Provider: Helane Rima, DO  Chief Complaint:  Severe back pain with radiculopathy and foot drop History of Present Illness: STEPHANNE GREELEY is a 22 y.o. year old female presenting with Left foot drop of 1 day's duration along with severe back pain radiating to Left leg.  She was in her usual state of health until this morning when she awoke and noted upon arising severe back pain along with dragging her foot.  She tried to walk around her residence but was unable to lift her foot or walk without a limp.  Her pain was also increasing in severity as the day progressed.  She states her pain is worse in her Left lumbar region and has pain radiates that down the back of her leg.  Also complains of occasional tingling in her Left great toe when she attempts to move it.  Of note, she had a similar episode in October of 2011, which responded with Toradol and Prednisone.  She was also recently discharged from the hospital on June 1 for worsening back pain and inability to tolerate po pain medications.  She states currently that she has been tolerating po pain medication but has had trouble with insurance and is therefore at times without medication.  However, she states she was taking her medicines as prescribed up until today, when her pain was too severe for po medications and thus she came to the ED. She denies any new activities or work recently, stating that yesterday she was doing laundry and bending over some, but nothing out of the ordinary.  She denies any recent trauma, falls, incontinence of bowel or bladder, or saddle anesthesia.  Does endorse mild cough, nonproductive.  She states that Dr. Earlene Plater her PCP instructed her to come immediately to ED if she has any worsening symptoms or resumption of foot  drop similar to her previous episode.  In the ED, she has received several doses of Dilaudid as well as IV Solumedrol but the EDP was unable to break her pain and therefore FPTS was called for admission.      For PMH, Family History, Social history, medications, and allergies please see excellent R1 note.  Review Of Systems: Per HPI   Physical Exam:            General: alert, cooperative and appears stated age in no acute distress.   HEENT: PERRLA, extra ocular movement intact, sclera clear, anicteric, oropharynx clear, no lesions, neck supple with midline trachea, thyroid without masses and trachea midline Heart: S1, S2 normal, no murmur, rub or gallop, regular rate and rhythm Lungs: clear to auscultation, no wheezes or rales and unlabored breathing Abdomen: abdomen is soft without significant tenderness, masses, organomegaly or guarding Extremities: extremities normal, atraumatic, no cyanosis or edema Skin:no rashes, no ecchymoses Neurology: normal without focal findings, mental status, speech normal, alert and oriented x3 and PERLA.  Strength is 5/5 BL upper extremities with sensation grossly intact.  Strength is 5/5 RLE, 3/5 L hip flexors.  Patient refused plantar or dorsi-flexion examination due to pain.  Paresthesias present Left great toe.  Hyperalgesia present in L5 nerve distribution.    Labs and Imaging: Lab Results  Component Value Date/Time   NA 136 11/12/2010 10:48 AM   K 4.4 11/12/2010 10:48 AM   CL 101 11/12/2010 10:48 AM  CO2 24 11/12/2010 10:48 AM   BUN 13 11/12/2010 10:48 AM   CREATININE 0.60 11/12/2010 10:48 AM   GLUCOSE 102* 11/12/2010 10:48 AM   Lab Results  Component Value Date   WBC 5.7 11/12/2010   HGB 13.2 11/12/2010   HCT 37.7 11/12/2010   MCV 89.3 11/12/2010   PLT 287 11/12/2010   MR Lumbar Spine Wo Contrast  IMPRESSION:  1. New / increased annular tear of the left L5-S1 disc could be a  source for increased left L5 radiculitis.  2. Otherwise no interval change  in diffuse lumbar disc and facet  degeneration as detailed above. No high-grade spinal stenosis.  3. Stable benign liver lesions.    Assessment and Plan: SWAY GUTTIERREZ is a 60 y.o. year old female presenting with Left-sided foot drop and severe back pain radiating to her Left leg in a sciatic distribution.   1.  Back pain with radiculopathy:  Long-standing problem for this patient.  Now with new annular tear of L5 disc, likely causing worsening of her symptoms.  Previously she had been diagnosed with spondylolisthesis at L5-S1 with very severe facet arthropathy during her admission on October of 2011.  Neurosurgery was consulted at that time and recommended surgery; however, patient refused this.  After further discussion today, patient again adamantly refuses any surgical intervention.  She currently seems to have unrealistic goals regarding her treatment, stating that her goal is to completely pain-free without surgery.  As the EDP was unable to break her pain with IV pain medications, we will admit her for pain control as well as physical therapy evaluation.  Will also give Toradol as an anti-inflammatory.  Will not consult neurosurgery at this time as she is not interested in surgery and is not a surgical emergency.  A previous dictated note in the patient's medical record states that neurosurgery has seen this patient before in this past and recommended surgery.    2.  Nausea: Regular diet as tolerated.  Continue PO medications as well.  Not likely infectious due to regular white count and afebrile.   3.  HTN: Will continue patient's home medication. 4.  FEN/GI: ad lib, NS at 100 cc/hr 5.  Prophylaxis: SQ Heparin and Protonix along with Toradol 6.  Disposition: pending improvement                      Renold Don MD

## 2010-11-12 NOTE — H&P (Signed)
Family Medicine Teaching [facility] Admission History and Physical  Patient name: Raven [name] Medical record number: [id number] Date of birth: 1950-12-26 Age: 60 y.o. Gender: female  Primary Care Provider: Helane Rima, DO  Chief Complaint: Left foot drop History of Present Illness: Raven [name] is a 60 y.o. year old female presenting with left foot drop and pain.  Yesterday had some nausea and small amounts of diarrhea and left hip pain radiating to the back.  This morning when she got up she noticed that her left leg was dragging.  Pt was doing laundry and bending over yesterday, which made pain worse.  Otherwise, no falls, no changes in activity, no problems with urinary symptoms, no bowel/bladder incontinence.  No right leg symptoms.  Came to the ED because that was the instruction she had received from her PCP for if she developed foot drop on the weekend.  Does have some fevers/chills that have been somewhat long standing, has had some SOB for which she has been using her albuterol, slight cough.  Patient Active Problem List  Diagnoses  . HYPERLIPIDEMIA  . TOBACCO ABUSE  . HYPERTENSION, BENIGN SYSTEMIC  . ASTHMA, INTERMITTENT  . SCIATICA, LEFT  . Anorexia   Past Medical History: Past Medical History  Diagnosis Date  . Hypertension   . Spinal stenosis of lumbar region   . Asthma   . Tobacco abuse     Past Surgical History: Past Surgical History  Procedure Date  . Cesarean section     x2  . Tracheostomy     when she was 20    Social History: History   Social History  . Marital Status: Single    Spouse Name: N/A    Number of Children: N/A  . Years of Education: N/A   Social History Main Topics  . Smoking status: Current Everyday Smoker -- 0.3 packs/day    Types: Cigarettes  . Smokeless tobacco: Never Used  . Alcohol Use: No  . Drug Use: No  . Sexually Active: Not on file   Other Topics Concern  . Not on file   Social History Narrative  . No  narrative on file    Family History: No family history on file.  Allergies: No Known Allergies  Current Outpatient Prescriptions  Medication Sig Dispense Refill  . acetaminophen (TYLENOL) 500 MG tablet Take 2 tablets (1,000 mg total) by mouth 3 (three) times daily.  120 tablet  2  . albuterol (VENTOLIN HFA) 108 (90 BASE) MCG/ACT inhaler 1-2 puffs every 4 (four) hours as needed. For wheezing      . Calcium-Magnesium-Vitamin D (CITRACAL CALCIUM+D) 600-40-500 MG-MG-UNIT TB24 Take 1 tablet by mouth 2 (two) times daily.        Marland Kitchen gabapentin (NEURONTIN) 800 MG tablet Take 1 tablet (800 mg total) by mouth 3 (three) times daily.  90 tablet  6  . lisinopril-hydrochlorothiazide (PRINZIDE,ZESTORETIC) 20-25 MG per tablet Take 1/2 tablet by mouth daily for high blood pressure.  45 tablet  3  . Oxycodone HCl 10 MG TABS 1-2 tabs po TID prn pain. HOLD FOR SEDATION.  120 each  0   Review Of Systems: Per HPI with the following additions: No chest pain, rashes, or vomiting.  No hematuria or hematemesis. Otherwise 12 point review of systems was performed and was unremarkable.  Physical Exam: Pulse: 71  Blood Pressure: 113/69 RR: 16   O2: 100 on RA Temp: 98.2  General: alert, cooperative and mild distress HEENT: PERRLA, extra ocular movement  intact and oropharynx clear, no lesions Scar from trach Heart: S1, S2 normal, no murmur, rub or gallop, regular rate and rhythm Lungs: clear to auscultation, no wheezes or rales and unlabored breathing Abdomen: mild tenderness in the in the RUQ and in the RLQ., no guarding or rigidity Extremities: Pain on palpation of right lower extremity on the lateral aspect.  2+ pulses all 4 extremities Skin:no rashes Back: Pain on palpation over the entire lower back, no point-tenderness Neurology: mental status, speech normal, alert and oriented x3, PERLA, reflexes normal and symmetric and motor strength reduced at left ankle (4+).  Exam of strength at knee and hip limited due to  pain but is at least 4+  Labs and Imaging: Lab Results  Component Value Date/Time   NA 136 11/12/2010 10:48 AM   K 4.4 11/12/2010 10:48 AM   CL 101 11/12/2010 10:48 AM   CO2 24 11/12/2010 10:48 AM   BUN 13 11/12/2010 10:48 AM   CREATININE 0.60 11/12/2010 10:48 AM   GLUCOSE 102* 11/12/2010 10:48 AM   Lab Results  Component Value Date   WBC 5.7 11/12/2010   HGB 13.2 11/12/2010   HCT 37.7 11/12/2010   MCV 89.3 11/12/2010   PLT 287 11/12/2010   MR Lumbar Spine Wo Contrast    IMPRESSION: 1.  New / increased annular tear of the left L5-S1 disc could be a source for increased left L5 radiculitis. 2.  Otherwise no interval change in diffuse lumbar disc and facet degeneration as detailed above.  No high-grade spinal stenosis. 3.  Stable benign liver lesions.    Assessment and Plan: Raven [name] is a 60 y.o. year old female presenting with worsening left leg radiculopathy. 1. Pain: Pt has been evaluated by neurosurgery in the past and has been recommended for surgery.  Pt is not interested in surgery at the moment so I do not feel it is appropriate to consult them at this time.  Will admit, give IV Morphine and PO Percocet and monitor.  Was given solumedrol by the ED.  Will not continue this at this time but will discuss with attending in the AM.  Will also consult PT to see the patient.  Hopefully the patient's pain will improve spontaneously but, if it does not, will treat, readdress neurosurgical intervention, and discuss more aggressive PO pain regimin.  We would prefer to limit this patient's hospital stay due to her financial concerns.  These will also somewhat dictate what medications are available for OP therapy. 2. Nausea: Will give a regular diet but keep fluids running.  Zofran for nausea.  Feel nausea is likely related to pain as there is no evidence of infection on labs or vitals. 3. HTN: Will continue patient's home medications 4. Smoking: Pt not interested in quitting but also does not  feel the need for a patch. 5. FEN/GI: ad lib, NS at 100 cc/hr 6. Prophylaxis: SQ Heparin 7. Disposition: pending improvement.

## 2010-11-16 ENCOUNTER — Ambulatory Visit (INDEPENDENT_AMBULATORY_CARE_PROVIDER_SITE_OTHER): Payer: Self-pay | Admitting: Family Medicine

## 2010-11-16 ENCOUNTER — Encounter: Payer: Self-pay | Admitting: Family Medicine

## 2010-11-16 VITALS — BP 137/84 | HR 86 | Temp 97.9°F | Ht 64.0 in | Wt 152.0 lb

## 2010-11-16 DIAGNOSIS — M5430 Sciatica, unspecified side: Secondary | ICD-10-CM

## 2010-11-16 MED ORDER — MORPHINE SULFATE 30 MG PO TABS: 30.0000 mg | ORAL_TABLET | Freq: Four times a day (QID) | ORAL | Status: DC | PRN

## 2010-11-16 MED ORDER — NAPROXEN 500 MG PO TABS: 500.0000 mg | ORAL_TABLET | Freq: Two times a day (BID) | ORAL | Status: DC

## 2010-11-16 NOTE — Assessment & Plan Note (Signed)
No red flag today. Reviewed MRI. Discussed pain plan. Will increase Morpnine by 30%. Reviewed precautions  Patient to F/O in one month to meet new PCP.

## 2010-11-16 NOTE — Progress Notes (Signed)
  Subjective:    Patient ID: Raven [name], female    DOB: 04-12-1951, 60 y.o.   MRN: [id number]  HPI   1. 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 The patient's case was discussed with surgery while patient hospitalized, and determined she is a surgical candidate; however, the patient declined surgery, instead the patient agreed with maximizing pain control and advancing physical therapy as tolerated.  A HH Safety Evaluation has been completed. Her pain has been fairly controlled on her current regimen. Function improved 30% with PT/OT, however, shewas discharged 2/2 lack of progress. Still using a walker. Pain is usually in the low back pain with radiculopathy down left leg, tactile hyperesthesia left shin and foot. No bowel/bladder incontinence. Patient was referred to Neurosurgeon previously but cannot afford to go at this time. She is working on [employer]. She is very aware of potential RED FLAGS.  Update patient recently hospitalized secondary to foot drop. Follow MRI showing increased anular tear L5 S1. No other changes. Foot drop resolved with pain meds. Patient still declines surgery. Patient mention that Oxycodone does not help so she wants go back to Morphine Review of Systems  See HPI.    Objective:   Physical Exam  General:  Well-developed,well-nourished, in no acute distress; alert, appropriate and cooperative throughout examination. Vitals reviewed. Msk:  Tender to palpation in lower lumbar paraspinal muscles. Decreased ROM in extension > flexion due to pain. + SLR on left, but improved from last OV. Tactile hyperesthesia left shin and foot. Difficult to obtain DTR on left 2/2 patient gaurding, though seems decreased. Dorsiflexion normal. Pulses:  R and L dorsalis pedis and posterior tibial pulses are full and equal bilaterally. Extremities:  No edema. Neurologic:   See MSK.    Assessment & Plan:

## 2010-11-16 NOTE — Patient Instructions (Signed)
It was nice to see you today!  Call if you get any of the RED FLAGs that we talked about.   

## 2010-12-02 ENCOUNTER — Other Ambulatory Visit (HOSPITAL_COMMUNITY): Payer: Self-pay | Admitting: Family Medicine

## 2010-12-12 NOTE — Discharge Summary (Signed)
NAMECHARLISSA, Raven [name] NO.:  [id number]  MEDICAL RECORD NO.:  [id number]  LOCATION:  5009                         FACILITY:  [facility]  PHYSICIAN:  Pearlean Brownie, M.D.DATE OF BIRTH:  10-18-1950  DATE OF ADMISSION:  11/12/2010 DATE OF DISCHARGE:  11/13/2010                              DISCHARGE SUMMARY   PRIMARY CARE PROVIDER:  Helane Rima, MD, at [facility].  DISCHARGE MEDICATIONS: 1. Percocet 5/325 one tablet by mouth every 4 hours as needed for     pain. 2. Naprosyn 500 mg 1 tablet by mouth 3 times daily. 3. Albuterol inhaler 2 puffs every 4 hours as needed for shortness of     breath. 4. Prinzide 20/25 one half tablet by mouth daily.  LABORATORY DATA AND STUDIES: 1. CBC performed on November 12, 2010, showed white count 5.7, hemoglobin     13.2, and hematocrit 287. 2. Basic metabolic performed on November 12, 2010, showed sodium 136,     potassium 4.4, chloride 101, bicarb 24, glucose 102, BUN 13, and     creatinine 0.6. 3. MRI of the C-spine obtained on November 12, 2010, showed new versus     increased annular tear of the left L5-S1 disk, but no interval     change and diffuse lumbar disk and facet degeneration.  No high-     grade spinal stenosis.  Also showed stable benign liver lesions.  DISCHARGE DIAGNOSES: 1. L5 radiculopathy with left footdrop. 2. Hyperlipidemia. 3. Tobacco abuse. 4. Hypertension. 5. Intermittent asthma.  BRIEF HOSPITAL COURSE:  [name] is a 60 year old female with a known left L5/S1 radiculopathy who presented to the emergency department with new onset footdrop on the left and worsening left leg pain. 1. Pain:  The patient did not demonstrate any concerning signs or     symptoms.  MRI of the spine did not demonstrate any lesions     amenable to acute neurosurgical intervention.  The patient was     initially admitted, placed on p.o. Percocet for pain control along     with Toradol.  As the patient has  previously refused neurosurgical     intervention, and was adamant that she would not consider at this     point in time, Neurosurgery was not consulted.  The following     morning, the patient was felt stable for discharge as her pain was     well controlled with p.o. Percocet. 2. Tobacco abuse:  The patient was counseled on smoking cessation     during her admission. 3. Hypertension:  The patient was discharged on her home medications.  DISCHARGE INSTRUCTIONS:  The patient was discharged with instructions to increase activity slowly, call the [facility] for an appointment, return to the emergency department if she has loss of bowel or bladder function.  ISSUES FOR FOLLOWUP:  None.  FOLLOWUP APPOINTMENTS:  The patient was instructed to call for an appointment at [facility] when the office opened out in the weekend.  DISCHARGE CONDITION:  The patient is discharged home in stable medical condition.    ______________________________ [name], MD   ______________________________ Pearlean Brownie, M.D.  ER/MEDQ  D:  11/16/2010  T:  11/16/2010  Job:  [id number]  Electronically Signed by Manuela Neptune MD on 11/30/2010 12:47:51 PM Electronically Signed by Pearlean Brownie M.D. on 12/12/2010 01:59:34 PM

## 2010-12-12 NOTE — H&P (Signed)
NAMEFAYLENE, Raven [name] NO.:  [id number]  MEDICAL RECORD NO.:  [id number]  LOCATION:  5009                         FACILITY:  [facility]  PHYSICIAN:  Pearlean Brownie, M.D.DATE OF BIRTH:  [birth year]  DATE OF ADMISSION:  11/12/2010 DATE OF DISCHARGE:                             HISTORY & PHYSICAL   PRIMARY CARE PROVIDER:  Helane Rima, MD of [facility].  CHIEF COMPLAINT:  Left foot drop.  HISTORY OF PRESENT ILLNESS:  [name] is a 60 year old female presenting with left foot drop and left leg pain.  The patient reports that yesterday she had nausea and small amounts of diarrhea accompanied by left hip pain radiating to the back.  This morning, she noticed that her left foot was dragging as she walks.  The patient was doing laundry and bending over yesterday, which has made her pain worse.  Otherwise, no falls, changes in activity, no problems with urinary symptoms or bowel/bladder incontinence.  No right leg symptoms.  The patient came to the emergency department, partially because of her pain and also because she had been instructed to come to the emergency department on the weekend if she developed any symptoms of foot drop or urinary problems by her PCP.  The patient does report some fevers and chills that have been longstanding (greater than 1-2 months) and has had some shortness of breath related to her asthma for which she has been using albuterol to good effect.  The patient also reports some slight cough.  PAST MEDICAL HISTORY: 1. Hyperlipidemia. 2. Tobacco abuse. 3. Hypertension. 4. Intermittent asthma. 5. Left sciatica.  PAST SURGICAL HISTORY: 1. Cesarean section x2. 2. Tracheostomy when the patient was 60 years old, now resolved.  SOCIAL HISTORY:  The patient is single, smokes approximately 6 cigarettes per day, and denies alcohol or drug use.  ALLERGIES:  NOVOCAINE.  MEDICATIONS: 1. Tylenol 1000 mg by mouth 3 times  daily. 2. Albuterol inhaler 1-2 puffs every 4 hours as needed for wheezing. 3. Citracal 1 tablet by mouth 2 times daily. 4. Gabapentin 800 mg by mouth 3 times daily. 5. Prinzide 20/25 half tablet by mouth daily for high blood pressure. 6. Morphine 10 mg by mouth every 4 hours as needed for pain.  REVIEW OF SYSTEMS:  Per HPI with the following additions:  No chest pain, rashes, or vomiting.  No hematuria or hematemesis.  Otherwise, 12- point review of systems was performed and was unremarkable.  PHYSICAL EXAM:  VITAL SIGNS:  Pulse 71, blood pressure 113/69, respirations 16, O2 sats 100% on room air, temperature is 98.2 degrees Fahrenheit. GENERAL:  Alert, cooperative, and in mild distress. HEENT:  Pupils equal, round, reactive to light.  Extraocular movements intact.  Oropharynx clear with no lesions. NECK:  A well-healed scar from a trach. HEART:  S1 and S2 normal.  No murmurs, rubs, or gallops.  Regular rate and rhythm. LUNGS:  Clear to auscultation.  No wheezes or rales.  Unlabored breathing. ABDOMEN:  Mild tenderness in both right upper and right lower quadrants with no guarding or rigidity.  No peritoneal signs. EXTREMITIES:  Pain on palpation of the right lower extremity on the lateral aspect,  2+ pulses in all four extremities. SKIN:  No rashes. BACK:  Pain on palpation over the entire lower back with no point tenderness. NEUROLOGIC:  Mental status and speech are normal.  Alert and oriented x3.  Pupils equal, round, and reactive to light.  Reflexes normal and symmetric.  Motor strength mildly reduced at the left ankle (4+).  Exam of strength at the left knee and hip is severely limited due to pain, but is at least 4+.  LABORATORIES AND IMAGING: 1. Basic metabolic showed sodium 136, potassium 4.4, chloride 101,     bicarb 24, BUN 13, creatinine 0.6, glucose 102. 2. CBC shows white count 5.7, hemoglobin 13.2, platelets 287. 3. MRI of the lumbar spine without contrast shows a  new or increased     annular tear of the left L5-S1 disk that could be a source for     increased left L5 radiculitis, no interval change in diffuse lumbar     disk and facet degeneration, no high-grade spinal stenosis, and     stable benign liver lesions.  ASSESSMENT AND PLAN:  [name] is a 60 year old female presenting with worsening left leg radiculopathy. 1. Pain:  The patient has been evaluated by Neurosurgery in the past     and has been recommended for surgery.  The patient is in statically     not interested in surgery at this moment, so I do not feel it is     appropriate to consult Neurosurgery at this time.  We will admit     the patient, give IV morphine and p.o. Percocet for pain control     and monitor.  The patient was given IV Solu-Medrol in the emergency     department.  We will not continue this at this point in time.  We     will discuss this with our attending in the morning.  We will     consult Physical Therapy to see the patient.  Hopefully, the     patient's pain will improve spontaneously, but if it does not, we     will continue to treat, readdress neurosurgical intervention, and     discuss a more aggressive p.o. pain regimen as an outpatient. 2. Nausea:  We will give the patient a regular diet to keep fluids     running at approximately maintenance.  Zofran for nausea.  I feel     that the patient's nausea is most likely related to pain as there     is no evidence of infection on labs or vitals. 3. Hypertension:  We will continue the patient's home medications for     this condition. 4. Smoking:  The patient is not interested in quitting, but does not     feel a need for a patch at this time.  We will make patch available     to her if she changes her mind at a later point in time. 5. Fluids, electrolytes, nutrition/gastrointestinal:  p.o. ad lib,     normal saline at 100 mL an hour. 6. Prophylaxis:  Subcutaneous heparin. 7. Disposition:  Will be pending  improvement and adequate pain control     by mouth.    ______________________________ [name], MD   ______________________________ Pearlean Brownie, M.D.    ER/MEDQ  D:  11/12/2010  T:  11/13/2010  Job:  [id number]  Electronically Signed by Manuela Neptune MD on 11/30/2010 12:48:19 PM Electronically Signed by Pearlean Brownie M.D. on 12/12/2010 01:59:19 PM

## 2010-12-17 ENCOUNTER — Encounter: Payer: Self-pay | Admitting: Family Medicine

## 2010-12-20 ENCOUNTER — Encounter: Payer: Self-pay | Admitting: Family Medicine

## 2010-12-20 ENCOUNTER — Ambulatory Visit (INDEPENDENT_AMBULATORY_CARE_PROVIDER_SITE_OTHER): Payer: Self-pay | Admitting: Family Medicine

## 2010-12-20 DIAGNOSIS — F17200 Nicotine dependence, unspecified, uncomplicated: Secondary | ICD-10-CM

## 2010-12-20 DIAGNOSIS — I10 Essential (primary) hypertension: Secondary | ICD-10-CM

## 2010-12-20 DIAGNOSIS — M5430 Sciatica, unspecified side: Secondary | ICD-10-CM

## 2010-12-20 MED ORDER — MORPHINE SULFATE 15 MG PO TABS: 15.0000 mg | ORAL_TABLET | Freq: Three times a day (TID) | ORAL | Status: DC

## 2010-12-20 MED ORDER — ALBUTEROL SULFATE HFA 108 (90 BASE) MCG/ACT IN AERS: 1.0000 | INHALATION_SPRAY | RESPIRATORY_TRACT | Status: DC | PRN

## 2010-12-20 NOTE — Progress Notes (Signed)
  Subjective:    Patient ID: Raven [name], female    DOB: April 17, 1951, 60 y.o.   MRN: [id number]  HPI 60 yo female with h/o HTN, L5-S1 spondylolisthesis, asthma and tobacco abuse who presents for 1 month follow up for pain medicine. She currently takes morphine 15mg  q4/prn and naproxen 500mg  bid and gabapentin 80mg  tid. She rates her back pain at a 6/10. Denies any weakness, any urinary or bowel incontinence. She denies any loss of sensation. She walks with a walker. She was referred for surgery but declines it preferring physical therapy and medical management. She is not in any PT program right now due to lack of insurance, but just became eligible for disability.  2. HTN: taking Lisinopril/HCTZ. Denies any headache, changes in vision, any chest pain or palpitations. 3. Tobacco abuse: pt smokes 1 pack in four days. Doesn't express any interest in quitting at this time. Review of Systems Negative except per HPI    Objective:   Physical Exam Physical Examination: General appearance - alert, well appearing, and in no distress Neck - supple, no significant adenopathy Chest - clear to auscultation, no wheezes, rales or rhonchi, symmetric air entry Heart - normal rate, regular rhythm, normal S1, S2, no murmurs, rubs, clicks or gallops Back exam - tender over the lumbar paraspinal muscles b/l, some tenderness along the lumbar spine.  Neurological - alert, oriented, normal speech, no focal findings or movement disorder noted, Right patellar reflex: 2+, left patellar: 1+. Sensation intact in lower extremities b/l. Musculoskeletal - 5/5 strenght b/l hip extension and flexion. 5/5 b/l knee extension and flexion, 5/5 dorsiflexion and plantar flexion b/l.        Assessment & Plan:   No problem-specific assessment & plan notes found for this encounter.

## 2010-12-20 NOTE — Assessment & Plan Note (Signed)
Spondylolisthesis: pt signed new pain contract. Agreed to keep taking morphine 15mg  tid for one month and reassess in one month. Discussed the risks associated with opiate use in older adults such as dizziness and constipation. Continue naproxen and gabapentin.   Will follow up in one month on possible referrals for physical therapy now that she has disability insurance.

## 2010-12-20 NOTE — Assessment & Plan Note (Signed)
Contemplation stage: pt responsive to offer that if she ever felt like quitting she should mention it at one of her appointments so that we can set up a way for her quit.

## 2010-12-20 NOTE — Patient Instructions (Addendum)
It was great meeting you today! I refilled your Albuterol.  I wrote a new prescription for your morphine 15mg , which you can keep taking three times per day. We can then reevaluate how your pain is controled in one month.

## 2010-12-20 NOTE — Assessment & Plan Note (Signed)
BP well controled (110/60) with lisinopril/HCTZ 20-25mg . Continue current management.

## 2011-01-02 ENCOUNTER — Emergency Department (HOSPITAL_COMMUNITY)
Admission: EM | Admit: 2011-01-02 | Discharge: 2011-01-03 | Disposition: A | Discharge status: Home / Self Care | Payer: Self-pay | Attending: Emergency Medicine | Admitting: Emergency Medicine

## 2011-01-02 DIAGNOSIS — Z79899 Other long term (current) drug therapy: Secondary | ICD-10-CM | POA: Insufficient documentation

## 2011-01-02 DIAGNOSIS — I10 Essential (primary) hypertension: Secondary | ICD-10-CM | POA: Insufficient documentation

## 2011-01-02 DIAGNOSIS — J45909 Unspecified asthma, uncomplicated: Secondary | ICD-10-CM | POA: Insufficient documentation

## 2011-01-02 DIAGNOSIS — F17200 Nicotine dependence, unspecified, uncomplicated: Secondary | ICD-10-CM | POA: Insufficient documentation

## 2011-01-02 DIAGNOSIS — X58XXXA Exposure to other specified factors, initial encounter: Secondary | ICD-10-CM | POA: Insufficient documentation

## 2011-01-02 DIAGNOSIS — E785 Hyperlipidemia, unspecified: Secondary | ICD-10-CM | POA: Insufficient documentation

## 2011-01-02 DIAGNOSIS — R51 Headache: Secondary | ICD-10-CM | POA: Insufficient documentation

## 2011-01-02 DIAGNOSIS — M6281 Muscle weakness (generalized): Secondary | ICD-10-CM | POA: Insufficient documentation

## 2011-01-02 DIAGNOSIS — T148XXA Other injury of unspecified body region, initial encounter: Secondary | ICD-10-CM | POA: Insufficient documentation

## 2011-01-03 ENCOUNTER — Emergency Department (HOSPITAL_COMMUNITY): Payer: Self-pay

## 2011-01-03 LAB — POCT I-STAT TROPONIN I: Troponin i, poc: 0 ng/mL (ref 0.00–0.08)

## 2011-01-03 LAB — DIFFERENTIAL
Basophils Absolute: 0 10*3/uL (ref 0.0–0.1)
Eosinophils Absolute: 0.1 10*3/uL (ref 0.0–0.7)
Eosinophils Relative: 1 % (ref 0–5)
Lymphocytes Relative: 25 % (ref 12–46)
Neutrophils Relative %: 61 % (ref 43–77)

## 2011-01-03 LAB — CBC
Platelets: 290 10*3/uL (ref 150–400)
RBC: 3.87 MIL/uL (ref 3.87–5.11)
RDW: 12.9 % (ref 11.5–15.5)
WBC: 10.3 10*3/uL (ref 4.0–10.5)

## 2011-01-03 LAB — POCT I-STAT, CHEM 8
Chloride: 98 mEq/L (ref 96–112)
HCT: 38 % (ref 36.0–46.0)
Potassium: 4.1 mEq/L (ref 3.5–5.1)

## 2011-01-06 ENCOUNTER — Encounter: Payer: Self-pay | Admitting: Family Medicine

## 2011-01-06 ENCOUNTER — Ambulatory Visit (INDEPENDENT_AMBULATORY_CARE_PROVIDER_SITE_OTHER): Payer: Self-pay | Admitting: Family Medicine

## 2011-01-06 VITALS — BP 109/66 | HR 94 | Wt 152.0 lb

## 2011-01-06 DIAGNOSIS — K089 Disorder of teeth and supporting structures, unspecified: Secondary | ICD-10-CM

## 2011-01-06 DIAGNOSIS — K0889 Other specified disorders of teeth and supporting structures: Secondary | ICD-10-CM | POA: Insufficient documentation

## 2011-01-06 MED ORDER — AMOXICILLIN 500 MG PO CAPS: 500.0000 mg | ORAL_CAPSULE | Freq: Two times a day (BID) | ORAL | Status: AC

## 2011-01-06 NOTE — Patient Instructions (Signed)
Amoxicillin twice daily for 10 days Listerine bid Floss and brush every time you ear

## 2011-01-06 NOTE — Assessment & Plan Note (Signed)
There are no good solutions for dental care for patients who cannot afford, recommended dental school at [facility] but does not believe they have transportation for such.  Discussed good hygiene: add Listerine, flossing and brushing.  Will treat with antibiotics for 10 days to see if there is improvement.  She has a follow up apt with Dr. Gwenlyn Saran next week.

## 2011-01-06 NOTE — Progress Notes (Signed)
  Subjective:    Patient ID: Raven [name], female    DOB: 19-Apr-1951, 60 y.o.   MRN: [id number]  HPI  Micah Flesher to ER 8/14 for headache and CT of head within normal limits.  She has a painful left upper molar and headache radiates to the left temple.  She has not had dental care in a while.  She has been approved for disability but needs to wait for 2 years before Medicare kicks in.  Review of Systems  Constitutional: Negative for fever and activity change.  Musculoskeletal: Positive for back pain.  Neurological: Positive for headaches.       Objective:   Physical Exam  Constitutional:       In no distress  HENT:  Right Ear: External ear normal.  Left Ear: External ear normal.       Teeth in poor condition with decay, plaque, and advanced gum disease.  Left upper molar very tender to tapping,  Eyes: EOM are normal. Pupils are equal, round, and reactive to light.          Assessment & Plan:

## 2011-01-13 ENCOUNTER — Encounter: Payer: Self-pay | Admitting: Family Medicine

## 2011-01-13 ENCOUNTER — Ambulatory Visit (INDEPENDENT_AMBULATORY_CARE_PROVIDER_SITE_OTHER): Payer: Self-pay | Admitting: Family Medicine

## 2011-01-13 DIAGNOSIS — K0889 Other specified disorders of teeth and supporting structures: Secondary | ICD-10-CM

## 2011-01-13 DIAGNOSIS — K089 Disorder of teeth and supporting structures, unspecified: Secondary | ICD-10-CM

## 2011-01-13 MED ORDER — IBUPROFEN 800 MG PO TABS: 800.0000 mg | ORAL_TABLET | Freq: Three times a day (TID) | ORAL | Status: AC | PRN

## 2011-01-13 MED ORDER — GABAPENTIN 800 MG PO TABS: 800.0000 mg | ORAL_TABLET | Freq: Three times a day (TID) | ORAL | Status: DC

## 2011-01-13 MED ORDER — LISINOPRIL-HYDROCHLOROTHIAZIDE 20-25 MG PO TABS: ORAL_TABLET | ORAL | Status: DC

## 2011-01-13 NOTE — Patient Instructions (Signed)
I think your headache is due to the tooth ache. The only thing that is going to help the tooth is to get it fixed by a dentist. Waiting to see a dentist, continue with the antibiotics.

## 2011-01-13 NOTE — Assessment & Plan Note (Signed)
Patient's headache is most likely associated with the left tooth pain. Temporal arteritis is less likely given the absence of tenderness to palpation or absence of change in vision. Advised patient that only remedy to tooth pain was seeing a dentist. Patient acknowledged this. Advised patient to finish course of antibiotics.

## 2011-01-13 NOTE — Progress Notes (Signed)
  Subjective:    Patient ID: Raven [name], female    DOB: [birth year], 60 y.o.   MRN: [id number]  HPI 60 yo female with h/o chronic lower back pain and hypertension who presents for medication refills and for headache. Headache is left temporal and started 2-3 days ago. It is associated with a tooth ache that has lasted over one week. Patient was in clinic last week and was prescribed Amoxicillin for left upper molar infection, which she has been taking. Her headache comes and goes It is worst after eating. It is not associated with photophobia or phonophobia. Denies any change in vision. No nausea or vomiting. Tooth pain wakes her up at night and is associated with the left temporal pain. She is on naproxen and morphine for her back, which has not improved her headache or tooth ache.     Review of Systems Negative except per HPI    Objective:   Physical Exam Physical Examination: General appearance - alert, well appearing, and in no distress Mouth - left upper molar with decay, painful with tapping. Left temporal pain reproduced with tapping of the molar. No swelling. No edema.  Chest - clear to auscultation, no wheezes, rales or rhonchi, symmetric air entry Heart - normal rate, regular rhythm, normal S1, S2, no murmurs, rubs, clicks or gallops Abdomen - soft, nontender, nondistended, no masses or organomegaly Neurological - alert, oriented, normal speech, no focal findings or movement disorder noted, cranial nerves II through XII intact. No tenderness to palpation on left temporal side.       Assessment & Plan:

## 2011-01-20 ENCOUNTER — Other Ambulatory Visit: Payer: Self-pay | Admitting: Family Medicine

## 2011-01-20 DIAGNOSIS — Z1231 Encounter for screening mammogram for malignant neoplasm of breast: Secondary | ICD-10-CM

## 2011-01-26 ENCOUNTER — Encounter: Payer: Self-pay | Admitting: Family Medicine

## 2011-02-03 ENCOUNTER — Ambulatory Visit (INDEPENDENT_AMBULATORY_CARE_PROVIDER_SITE_OTHER): Payer: Self-pay | Admitting: Family Medicine

## 2011-02-03 ENCOUNTER — Encounter: Payer: Self-pay | Admitting: Family Medicine

## 2011-02-03 VITALS — BP 110/70 | HR 83 | Ht 63.0 in | Wt 155.0 lb

## 2011-02-03 DIAGNOSIS — I10 Essential (primary) hypertension: Secondary | ICD-10-CM

## 2011-02-03 MED ORDER — LISINOPRIL-HYDROCHLOROTHIAZIDE 20-25 MG PO TABS: ORAL_TABLET | ORAL | Status: DC

## 2011-02-03 NOTE — Progress Notes (Signed)
  Subjective:    Patient ID: Raven [name], female    DOB: [birth year], 60 y.o.   MRN: [id number]  HPI 60 yo female with h/o hyperlipidemia, hypertension and chronic back pain who presented for refill on her blood pressure medicine, lisinopril-HCTZ. She denies any cough, any shortness of breath, any chest pain, any lower extremity swelling, any headache or change in vision.   Review of Systems Negative except per HPI    Objective:   Physical Exam BP: 110/70, HR: 83, height: 5'3, weight: 155lbs Physical Examination: General appearance - alert, well appearing, and in no distress Chest - clear to auscultation, no wheezes, rales or rhonchi, symmetric air entry Heart - normal rate, regular rhythm, normal S1, S2, no murmurs, rubs, clicks or gallops Abdomen - soft, nontender, nondistended, no masses or organomegaly Extremities - peripheral pulses normal, no pedal edema, no clubbing or cyanosis       Assessment & Plan:

## 2011-02-03 NOTE — Assessment & Plan Note (Addendum)
BP is very well controled on lisinopril-HCTZ 20-25 1/2 pill per day. No side effects noted. Will refill medicine. CMet in July was within normal limits.

## 2011-02-03 NOTE — Patient Instructions (Signed)
It was nice to see you today.  Try and see Ms Raven [name] to apply for the orange card.

## 2011-02-07 ENCOUNTER — Ambulatory Visit (HOSPITAL_COMMUNITY): Payer: Self-pay

## 2011-02-13 ENCOUNTER — Ambulatory Visit: Payer: Self-pay | Admitting: Family Medicine

## 2011-02-17 ENCOUNTER — Ambulatory Visit (HOSPITAL_COMMUNITY)
Admission: RE | Admit: 2011-02-17 | Discharge: 2011-02-17 | Disposition: A | Discharge status: Home / Self Care | Payer: Self-pay | Source: Ambulatory Visit | Attending: Family Medicine | Admitting: Family Medicine

## 2011-02-17 DIAGNOSIS — Z1231 Encounter for screening mammogram for malignant neoplasm of breast: Secondary | ICD-10-CM | POA: Insufficient documentation

## 2011-03-09 ENCOUNTER — Ambulatory Visit (INDEPENDENT_AMBULATORY_CARE_PROVIDER_SITE_OTHER): Payer: Self-pay | Admitting: Family Medicine

## 2011-03-09 ENCOUNTER — Encounter: Payer: Self-pay | Admitting: Family Medicine

## 2011-03-09 DIAGNOSIS — L659 Nonscarring hair loss, unspecified: Secondary | ICD-10-CM

## 2011-03-09 DIAGNOSIS — M5432 Sciatica, left side: Secondary | ICD-10-CM

## 2011-03-09 DIAGNOSIS — M5430 Sciatica, unspecified side: Secondary | ICD-10-CM

## 2011-03-09 LAB — TSH: TSH: 0.673 u[IU]/mL (ref 0.350–4.500)

## 2011-03-09 MED ORDER — NAPROXEN 500 MG PO TABS: 500.0000 mg | ORAL_TABLET | Freq: Two times a day (BID) | ORAL | Status: DC

## 2011-03-09 MED ORDER — IBUPROFEN 800 MG PO TABS: 800.0000 mg | ORAL_TABLET | Freq: Three times a day (TID) | ORAL | Status: DC | PRN

## 2011-03-09 MED ORDER — MORPHINE SULFATE 15 MG PO TABS: 15.0000 mg | ORAL_TABLET | Freq: Three times a day (TID) | ORAL | Status: DC

## 2011-03-09 NOTE — Patient Instructions (Signed)
It was great seeing you! Keep up with the walking! I am going to refill your morphine and your naproxen.  We'll also get a thyroid lab to make sure your hair loss is not due to that and I'll call you with the results.

## 2011-03-11 NOTE — Progress Notes (Signed)
  Subjective:    Patient ID: Raven [name], female    DOB: Jan 02, 1951, 60 y.o.   MRN: [id number]  HPI  60 yo female with h/o spinal stenosis and spondylolisthesis who presented for follow up on her left leg pain and complaint of hair loss. 1. Lower back and left leg pain: went to [place] for a family reunion and walked for 25-67min at a time without significant pain. She walks wit a cane and sometime a walker. She walks three times a week for 1hr and tries to stay active. She uses morphine 15mg  twice a day which relieves some of the pain. She also takes naproxen 500mg  po bid Her pain is mostly in her lateral lower leg and to the plantar aspect of her foot. She denies any urinary or fecal incontinence.  2. Hair loss: complains that her hair has started being more fragile in the last month. She did use a new hair product around the same time that she thinks might have made it worst. She has been having some hot flashes lately as well. She denies any change in skin, any weight loss or weight gain, any new stress.   Review of Systems Negative except per HPI    Objective:   Physical Exam Physical Examination: General appearance - alert, well appearing, and in no distress Neck - thyroid exam: thyroid is normal in size without nodules or tenderness Chest - clear to auscultation, no wheezes, rales or rhonchi, symmetric air entry Heart - normal rate, regular rhythm, normal S1, S2, no murmurs, rubs, clicks or gallops Abdomen - soft, nontender, nondistended, no masses or organomegaly Musculoskeletal - some nonspecific tenderness along the lateral aspect of the leg. Pain with flexion of toes. +2 dorsalis pedis pulse b/l.  Skin: scalp: healthy skin. No erythema or lesions. No obvious patches of hair loss.        Assessment & Plan:  60 yo female with spondylolisthesis and spinal stenosis as well as alopecia 1. Spondylolisthesis and spinal stenosis: s/p MRI in June that showed worst annular tear of l%  and S1 as well as L5 radiculitis, facet degeneration and spinal stenosis. Patient has not yet had a chance to get physical therapy due to lack of insurance, but knows that she needs to keep active which she is doing. We will continue with morphine and naproxen for pain control. Patient also to continue with ranitidine for GI protection.  2. Alopecia: doesn't seem to be due to any skin or fungal process. Most likely due to change in hair products. With complaint of hot flashes, we will however get a TSH to exclude any thyroid etiology.

## 2011-04-07 ENCOUNTER — Ambulatory Visit (INDEPENDENT_AMBULATORY_CARE_PROVIDER_SITE_OTHER): Payer: Self-pay | Admitting: Family Medicine

## 2011-04-07 ENCOUNTER — Encounter: Payer: Self-pay | Admitting: Family Medicine

## 2011-04-07 VITALS — BP 110/70 | HR 76 | Temp 98.0°F | Ht 63.0 in | Wt 156.0 lb

## 2011-04-07 DIAGNOSIS — K089 Disorder of teeth and supporting structures, unspecified: Secondary | ICD-10-CM

## 2011-04-07 DIAGNOSIS — K0889 Other specified disorders of teeth and supporting structures: Secondary | ICD-10-CM

## 2011-04-07 DIAGNOSIS — M5430 Sciatica, unspecified side: Secondary | ICD-10-CM

## 2011-04-07 DIAGNOSIS — I10 Essential (primary) hypertension: Secondary | ICD-10-CM

## 2011-04-07 DIAGNOSIS — Z0000 Encounter for general adult medical examination without abnormal findings: Secondary | ICD-10-CM

## 2011-04-07 DIAGNOSIS — K219 Gastro-esophageal reflux disease without esophagitis: Secondary | ICD-10-CM

## 2011-04-07 DIAGNOSIS — M4800 Spinal stenosis, site unspecified: Secondary | ICD-10-CM

## 2011-04-07 MED ORDER — MORPHINE SULFATE 15 MG PO TABS: 15.0000 mg | ORAL_TABLET | Freq: Three times a day (TID) | ORAL | Status: DC

## 2011-04-07 NOTE — Patient Instructions (Addendum)
It was great seeing you today! We will start you on the effexor to take for your pain. Otherwise, for the constipation, take miralax every day or more if needed.  We will do your pap smear at your next visit

## 2011-04-08 ENCOUNTER — Encounter: Payer: Self-pay | Admitting: Family Medicine

## 2011-04-08 ENCOUNTER — Other Ambulatory Visit: Payer: Self-pay | Admitting: Family Medicine

## 2011-04-08 DIAGNOSIS — Z0000 Encounter for general adult medical examination without abnormal findings: Secondary | ICD-10-CM | POA: Insufficient documentation

## 2011-04-08 DIAGNOSIS — M5430 Sciatica, unspecified side: Secondary | ICD-10-CM

## 2011-04-08 MED ORDER — NAPROXEN 500 MG PO TABS: 500.0000 mg | ORAL_TABLET | Freq: Two times a day (BID) | ORAL | Status: DC

## 2011-04-08 MED ORDER — VENLAFAXINE HCL 75 MG PO TABS: 75.0000 mg | ORAL_TABLET | Freq: Two times a day (BID) | ORAL | Status: DC

## 2011-04-08 MED ORDER — GABAPENTIN 800 MG PO TABS: 800.0000 mg | ORAL_TABLET | Freq: Three times a day (TID) | ORAL | Status: DC

## 2011-04-08 MED ORDER — ACETAMINOPHEN 325 MG PO TABS: 650.0000 mg | ORAL_TABLET | Freq: Four times a day (QID) | ORAL | Status: AC | PRN

## 2011-04-08 MED ORDER — RANITIDINE HCL 150 MG PO TABS: 150.0000 mg | ORAL_TABLET | Freq: Every day | ORAL | Status: DC

## 2011-04-08 MED ORDER — ALBUTEROL SULFATE HFA 108 (90 BASE) MCG/ACT IN AERS: 1.0000 | INHALATION_SPRAY | RESPIRATORY_TRACT | Status: DC | PRN

## 2011-04-08 MED ORDER — LISINOPRIL-HYDROCHLOROTHIAZIDE 20-25 MG PO TABS: ORAL_TABLET | ORAL | Status: DC

## 2011-04-08 NOTE — Assessment & Plan Note (Signed)
Blood pressure well controlled at 110/70. Continue with sinner pro-hydrochlorothiazide half a tab of 20/25 mg.

## 2011-04-08 NOTE — Assessment & Plan Note (Signed)
No signs of infection. No need for antibiotics at this point. Patient to continue oral hygiene and make appointment with dentist in January. Patient was told about the free dental clinic Friday and Saturday November 70 16th and 17th the patient said she did not want to wait in the cold which would probably make her back pain worse.

## 2011-04-08 NOTE — Assessment & Plan Note (Signed)
Spondylolisthesis and spinal stenosis: Pain described by patient appears typical for her flareups. Patient refuses option of neurosurgery and would rather do physical therapy. However patient does not have insurance and physical therapy is difficult option at this point. Patient says that she had gone to see Rudell Cobb for orange card and left a message and never heard back. Will refill morphine 15 mg 3 times a day and naproxen 500 mg twice a day. Advised patient to take as little ibuprofen as possible. Will also add Effexor 75 mg daily to help with peripheral neuropathy.

## 2011-04-08 NOTE — Progress Notes (Signed)
  Subjective:    Patient ID: Raven [name], female    DOB: 1951/02/20, 60 y.o.   MRN: [id number]  HPI 60 year old female with history of hypertension spondylolisthesis and spinal stenosis who presents for followup. She complains of lower back pain 8/10 along with left foot and leg pain similar to her regular pain. It has been going on for 3 days. Laying down helps. She describes the pain as shooting from the hip to the top of the toe. She feels like her left leg is heavy. She has not had any lower extremity weakness, urinary or fecal incontinence. She took ibuprofen twice a day which helped along with naproxen 500 mg twice a day morphine 15 mg 3 times a day and gabapentin 800 mg 3 times a day. A regular basis she takes ibuprofen 1 tablet every 3 days. She denies any recent trauma or any recent injury. She reports some constipation associated with use of morphine. She tried taking MiraLAX but only took it once thinking he was going to work in 2-3 days. She is also been having increased pain in the left tooth associated with left temporal headache. Her headache is brought on when she chews on something on her tooth hurts. She reports that she will be able to go to the dentist once her husband's dental insurance kicks in for her on January 1. She denies any fever.   Review of Systems Negative except per history of present illness    Objective:   Physical Exam Filed Vitals:   04/07/11 1338  BP: 110/70  Pulse: 76  Temp: 98 F (36.7 C)    Physical Examination: General appearance - alert, well appearing, and in no distress Mouth - mucous membranes moist, pharynx normal without lesions and carie noted on top left molar. Pain with tapping of both upper molars. No edema, drainage or abscess formation.  Lymphatics - no palpable lymphadenopathy Chest - clear to auscultation, no wheezes, rales or rhonchi, symmetric air entry Heart - normal rate, regular rhythm, normal S1, S2, no murmurs, rubs, clicks or  gallops Back exam - tenderness noted in the sacral region along spine. Some tenderness in right lower paraspinal muscles.  Neurological - DTR's normal and symmetric, normal muscle tone, no tremors, strength 5/5 Musculoskeletal - tenderness in left lower leg. Intact range of motion of knee. Pain with hip flexion and extension.     Assessment & Plan:

## 2011-04-08 NOTE — Assessment & Plan Note (Addendum)
Last Pap smear on O4 13 2009. Patient to have Pap smear at her next visit in a month. Declines flu shot. Colonoscopy: Never had a colonoscopy he does not have insurance. Has not signed up for his card.

## 2011-05-05 ENCOUNTER — Encounter: Payer: Self-pay | Admitting: Family Medicine

## 2011-05-05 ENCOUNTER — Ambulatory Visit (INDEPENDENT_AMBULATORY_CARE_PROVIDER_SITE_OTHER): Payer: Self-pay | Admitting: Family Medicine

## 2011-05-05 ENCOUNTER — Other Ambulatory Visit (HOSPITAL_COMMUNITY)
Admission: RE | Admit: 2011-05-05 | Discharge: 2011-05-05 | Disposition: A | Discharge status: Home / Self Care | Payer: Self-pay | Source: Ambulatory Visit | Attending: Family Medicine | Admitting: Family Medicine

## 2011-05-05 VITALS — BP 101/65 | HR 92 | Temp 98.3°F | Ht 63.0 in | Wt 158.0 lb

## 2011-05-05 DIAGNOSIS — K089 Disorder of teeth and supporting structures, unspecified: Secondary | ICD-10-CM

## 2011-05-05 DIAGNOSIS — K0889 Other specified disorders of teeth and supporting structures: Secondary | ICD-10-CM

## 2011-05-05 DIAGNOSIS — M4800 Spinal stenosis, site unspecified: Secondary | ICD-10-CM

## 2011-05-05 DIAGNOSIS — Z0000 Encounter for general adult medical examination without abnormal findings: Secondary | ICD-10-CM

## 2011-05-05 DIAGNOSIS — Z124 Encounter for screening for malignant neoplasm of cervix: Secondary | ICD-10-CM

## 2011-05-05 DIAGNOSIS — M5430 Sciatica, unspecified side: Secondary | ICD-10-CM

## 2011-05-05 DIAGNOSIS — Z01419 Encounter for gynecological examination (general) (routine) without abnormal findings: Secondary | ICD-10-CM | POA: Insufficient documentation

## 2011-05-05 MED ORDER — MORPHINE SULFATE 15 MG PO TABS: 15.0000 mg | ORAL_TABLET | Freq: Three times a day (TID) | ORAL | Status: DC

## 2011-05-05 MED ORDER — NORTRIPTYLINE HCL 25 MG PO CAPS: 25.0000 mg | ORAL_CAPSULE | Freq: Every day | ORAL | Status: DC

## 2011-05-05 MED ORDER — PENICILLIN V POTASSIUM 500 MG PO TABS: 500.0000 mg | ORAL_TABLET | Freq: Two times a day (BID) | ORAL | Status: AC

## 2011-05-05 NOTE — Patient Instructions (Signed)
I will send you a letter with the results of the pap smear. For your leg pain, we can start you on an antidepressant called nortriptiline that should not be too expensive and that you don't have to wean off before stopping.  For your tooth, I will prescribe penicillin that you can take twice daily for 3 weeks. This should help with any infection I may not see.

## 2011-05-05 NOTE — Progress Notes (Signed)
  Subjective:    Patient ID: Raven [name], female    DOB: [birth year], 60 y.o.   MRN: [id number]  HPI 60 year old female with history of spinal stenosis and spondylolisthesis who presents for her annual Pap. She complains of increasing numbness and weakness in her left leg and lower back. The pain shoots from her back down to her leg. She also has pain that shoots from her foot up to her leg. She experiences tingling numbness and feeling of pins and needles in her lower left leg and feels like her left big toe is numb. This has been chronic for her. Her back pain is worse with bending. Denies any bowel or bladder incontinence. She did not want to fill the Effexor because it was too expensive and she did not feel comfortable with twice a day of withdrawing if she were to run out. She keeps taking morphine 15 mg 3 times daily and gabapentin 800 mg 3 times daily. She also takes naproxen 500 mg twice daily and has been taking ibuprofen 800 mg once daily.  She also complains of persistent toothache in her left side. She will see a dentist in January when her dental insurance starts. The pain starts in her jaw goes up to her left temporal side. She denies any fever.  Gyn history: Menopause at 60 yo. Denies any vaginal discharge or bleeding.  Family History: paternal aunt had breast cancer in her 23s.  Mammogram: 02/17/11 was normal.  Review of Systems Negative except per history of present illness    Objective:   Physical Exam  Physical Examination: General appearance - alert, well appearing, and in no distress Mouth - left upper molar tender to touch, no evidence of abscess.  Abdomen - soft, nontender, nondistended, no masses or organomegaly Pelvic - normal external genitalia, vulva, vagina, cervix, uterus and adnexa Back exam - tenderness along lumbar and sacral spine as well as lower paraspinal muscles.  Musculoskeletal - pain with hip extension and flexion. Tenderness to palpation along left  shin. +1 dorsalis pedis pulse bilaterally.  Neuro: +2 right patellar reflex, +1 left patellar reflex, sensation to light touch decreased in left lower leg compared to right.      Assessment & Plan:

## 2011-05-05 NOTE — Assessment & Plan Note (Addendum)
Will send letter with pap results. Mammogram up to date.

## 2011-05-05 NOTE — Assessment & Plan Note (Signed)
Patient to make appointment with dentist in January. Prescribed penicillin V 500mg  bid for 3 weeks to cover any infection.

## 2011-05-05 NOTE — Assessment & Plan Note (Signed)
Pain from annular tear in L5-S1 still present and following dermatomal pattern. Since patient cannot afford Effexor we will start her on nortriptyline 25 mg for the first 3 days followed by 50 mg for the next 3 with a target dose of 75 mg. Patient advised to take at night given side effect of drowsiness. Will follow up in a month. Patient said she would try to go back to get the orange card so that she can be eligible for physical therapy. Refilled morphine today.

## 2011-05-09 ENCOUNTER — Encounter: Payer: Self-pay | Admitting: Family Medicine

## 2011-06-07 ENCOUNTER — Ambulatory Visit (INDEPENDENT_AMBULATORY_CARE_PROVIDER_SITE_OTHER): Payer: Self-pay | Admitting: Family Medicine

## 2011-06-07 ENCOUNTER — Encounter: Payer: Self-pay | Admitting: Family Medicine

## 2011-06-07 VITALS — BP 114/69 | HR 71 | Temp 98.5°F | Ht 63.0 in | Wt 156.5 lb

## 2011-06-07 DIAGNOSIS — M5430 Sciatica, unspecified side: Secondary | ICD-10-CM

## 2011-06-07 DIAGNOSIS — I10 Essential (primary) hypertension: Secondary | ICD-10-CM

## 2011-06-07 LAB — COMPREHENSIVE METABOLIC PANEL
ALT: 22 U/L (ref 0–35)
AST: 30 U/L (ref 0–37)
CO2: 20 mEq/L (ref 19–32)
Chloride: 102 mEq/L (ref 96–112)
Creat: 0.83 mg/dL (ref 0.50–1.10)
Sodium: 133 mEq/L — ABNORMAL LOW (ref 135–145)
Total Bilirubin: 0.3 mg/dL (ref 0.3–1.2)
Total Protein: 8 g/dL (ref 6.0–8.3)

## 2011-06-07 MED ORDER — OXYCODONE-ACETAMINOPHEN 5-325 MG PO TABS: 1.0000 | ORAL_TABLET | Freq: Three times a day (TID) | ORAL | Status: AC | PRN

## 2011-06-07 MED ORDER — IBUPROFEN 800 MG PO TABS: 800.0000 mg | ORAL_TABLET | Freq: Three times a day (TID) | ORAL | Status: DC | PRN

## 2011-06-07 NOTE — Assessment & Plan Note (Signed)
Will switch pain medication from morphine to percocet for this month to see how well pain is controled. Would encourage physical therapy as an ultimate therapy but patient does not have the orange card. Will obtain CMET to evaluate LFTs given tylenol use and Creatinine use given chronic NSAID use.

## 2011-06-07 NOTE — Assessment & Plan Note (Signed)
Well controled on lisinopril/hctz 10/12.5mg . Will check CMET to check electrolytes and Creatinine.

## 2011-06-07 NOTE — Patient Instructions (Signed)
We're drawing blood to check electrolytes, kidney and liver function. I'll either call you or send you a letter to let you know.  For your allergies, you can take cetirizine (zyrtec).  I'll see you next month and you can tell me how the new pain med is working.

## 2011-06-07 NOTE — Progress Notes (Signed)
Patient ID: KHALIAH BARNICK, female   DOB: 08-02-1950, 61 y.o.   MRN: [id number]  Patient ID: TUERE NWOSU    DOB: Jun 10, 1950, 61 y.o.   MRN: [id number] --- Subjective:  Quanda is a 61 y.o.female who presents for refill on pain medication. States that the morphine has no longer been helping with the pain. She states that she had 2 leftover percocet 5/325 and took one that helped with her pain for 2 days. She was not taking morphine at the time. She denies any fecal or urinary incontinence. She has some hypersensitivity and tingling in her left leg, but denies any numbness or weakness. She has not filled the nortriptyline because she does not want to "get hooked" to it. The gabapentin helps a lot. She takes naproxen twice daily and takes ibuprofen 800mg  three times weekly on average.  She has some heartburn for which she takes zantac. She complains of loss of appetite but does not feel like she has lost any weight or that her clothes fit more loosely.  She also mentions having season allergies which cause nasal congestion and sneezing. She has taken zyrtec in the past which used to help with this.   ROS: negative except per HPI   Objective: Filed Vitals:   06/07/11 1347  BP: 114/69  Pulse: 71  Temp: 98.5 F (36.9 C)    Physical Examination:   General appearance - alert, well appearing, and in no distress Chest - clear to auscultation, no wheezes, rales or rhonchi, symmetric air entry Heart - normal rate, regular rhythm, normal S1, S2, no murmurs, rubs, clicks or gallops Abdomen - soft, nontender, nondistended, no masses or organomegaly Musculoskeletal: tenderness along lumbar spine and left paraspinal muscles. Normal Hip flexion and extension. Hypersensitivity to touch on left leg and foot. Normal sensation bilaterally. Strength in Knee extension and flexion difficult to assess given pain

## 2011-06-26 ENCOUNTER — Ambulatory Visit (INDEPENDENT_AMBULATORY_CARE_PROVIDER_SITE_OTHER): Payer: Self-pay | Admitting: Family Medicine

## 2011-06-26 ENCOUNTER — Encounter: Payer: Self-pay | Admitting: Family Medicine

## 2011-06-26 VITALS — BP 130/73 | HR 79 | Ht 63.0 in | Wt 159.0 lb

## 2011-06-26 DIAGNOSIS — M5430 Sciatica, unspecified side: Secondary | ICD-10-CM

## 2011-06-26 MED ORDER — MORPHINE SULFATE 15 MG PO TABS: 15.0000 mg | ORAL_TABLET | Freq: Three times a day (TID) | ORAL | Status: DC | PRN

## 2011-06-26 NOTE — Patient Instructions (Signed)
Thank you for coming in today. Please switch to MSIR (morphine) up to 3x a day.  Follow up with Dr. Gwenlyn Saran ASAP.  Let me know if your pain worsens or you have bowel or bladder. Problems.

## 2011-06-27 NOTE — Assessment & Plan Note (Signed)
Not well controlled with percocet. Previously better controlled with MSIR 15mg  #90 per month.  Plan to switch to Raven [name]. Will take up percoet pills and destroy in a controlled manner. Raven [name] and Raven [name] provided this.  I discussed this patient with Dr. Gwenlyn Saran who agrees with this plan and will follow the patient up soon. Additionally I recommend that as she has never been well controlled with high doses of opiates that she be referred to pain management.  Discussed the plan with the patient including warning signs such worsening pain and bowel or bladder dysfunction. She expresses understanding.

## 2011-06-27 NOTE — Progress Notes (Signed)
Raven [name] is a 61 y.o. female who presents to [facility] today for Back pain with left leg radiculopathy.  [name] was recently seen by Dr. Gwenlyn Saran for chronic back pain. At that point her chronic pain medication was changed fom MSIR 15mg   90 tablets per month to Percocet 5/325 60 tablets per months. [name] notes that this pain medication is not sufficient to control her pain. She notes pain in her left buttock with radiation down her left leg into the dorsal foot and great toe. She denies any new weakness, numbness or bowel or bladder dysfunction. She was unable to get scheduled with Dr. Gwenlyn Saran in a timely manner. She brings her Percocet bottle with multiple pills today.    PMH reviewed. Significant for poorly controlled chronic back pain.  ROS as above otherwise neg Medications reviewed. Current Outpatient Prescriptions  Medication Sig Dispense Refill  . albuterol (VENTOLIN HFA) 108 (90 BASE) MCG/ACT inhaler Inhale 1-2 puffs into the lungs every 4 (four) hours as needed for wheezing or shortness of breath. For wheezing  6.7 g  2  . Biotin 10 MG TABS Take by mouth.        . Calcium-Magnesium-Vitamin D (CITRACAL CALCIUM+D) 600-40-500 MG-MG-UNIT TB24 Take 1 tablet by mouth 2 (two) times daily.        Marland Kitchen ibuprofen (ADVIL,MOTRIN) 800 MG tablet Take 1 tablet (800 mg total) by mouth every 8 (eight) hours as needed.  30 tablet  0  . lisinopril-hydrochlorothiazide (PRINZIDE,ZESTORETIC) 20-25 MG per tablet Take 1/2 tablet by mouth daily for high blood pressure.  90 tablet  3  . morphine (MSIR) 15 MG tablet Take 1 tablet (15 mg total) by mouth 3 (three) times daily as needed for pain.  90 tablet  0  . Multiple Vitamins-Minerals (CENTRUM SILVER PO) Take by mouth.        . naproxen (NAPROSYN) 500 MG tablet Take 1 tablet (500 mg total) by mouth 2 (two) times daily with a meal.  180 tablet  1  . ranitidine (ZANTAC) 150 MG tablet Take 1 tablet (150 mg total) by mouth daily.  90 tablet  3  . gabapentin (NEURONTIN) 800  MG tablet Take 1 tablet (800 mg total) by mouth 3 (three) times daily.  90 tablet  5  . morphine (MSIR) 30 MG tablet Take 1 tablet (30 mg total) by mouth every 6 (six) hours as needed for pain.  90 tablet  0    Exam:  BP 130/73  Pulse 79  Ht 5\' 3"  (1.6 m)  Wt 159 lb (72.122 kg)  BMI 28.17 kg/m2 Gen: Well NAD Lungs: CTABL Nl WOB Heart: RRR no MRG Abd: NABS, NT, ND Exts: Non edematous BL  LE, warm and well perfused.  MSK: Non-tender spinal midline. Left SI joint TTP. Straight leg raise does not worsen radiculopathy. Reflexes are 2+ BL.

## 2011-07-07 ENCOUNTER — Ambulatory Visit (INDEPENDENT_AMBULATORY_CARE_PROVIDER_SITE_OTHER): Payer: Self-pay | Admitting: Family Medicine

## 2011-07-07 ENCOUNTER — Encounter: Payer: Self-pay | Admitting: Family Medicine

## 2011-07-07 ENCOUNTER — Ambulatory Visit: Payer: Self-pay | Admitting: Family Medicine

## 2011-07-07 DIAGNOSIS — M5430 Sciatica, unspecified side: Secondary | ICD-10-CM

## 2011-07-07 DIAGNOSIS — K0889 Other specified disorders of teeth and supporting structures: Secondary | ICD-10-CM

## 2011-07-07 DIAGNOSIS — I10 Essential (primary) hypertension: Secondary | ICD-10-CM

## 2011-07-07 DIAGNOSIS — K089 Disorder of teeth and supporting structures, unspecified: Secondary | ICD-10-CM

## 2011-07-07 MED ORDER — LISINOPRIL-HYDROCHLOROTHIAZIDE 20-25 MG PO TABS: ORAL_TABLET | ORAL | Status: DC

## 2011-07-07 MED ORDER — IBUPROFEN 800 MG PO TABS: 800.0000 mg | ORAL_TABLET | Freq: Three times a day (TID) | ORAL | Status: DC | PRN

## 2011-07-07 MED ORDER — GABAPENTIN 800 MG PO TABS: 800.0000 mg | ORAL_TABLET | Freq: Three times a day (TID) | ORAL | Status: DC

## 2011-07-07 NOTE — Patient Instructions (Signed)
It was great seeing you today! We'll continue with the morphine for now and I will put in a referral for the pain clinic. The clinic will help set that up.

## 2011-07-09 NOTE — Assessment & Plan Note (Addendum)
Controlled on lisinopril hctz 10/12.5mg .

## 2011-07-09 NOTE — Assessment & Plan Note (Signed)
Has not yet been seen by dentist but says she will. Left side jaw pain radiating to her ear is most likely due to tooth infection. No evidence of otitis media or externa on exam.

## 2011-07-09 NOTE — Assessment & Plan Note (Signed)
Continue morphine 15mg  tid along with gabapentin and ibuprofen. Will send a referral for the pain clinic to see if anything further can be done. Patient does not want any surgery and has not applied for the orange card to be eligible for physical therapy.

## 2011-07-09 NOTE — Progress Notes (Signed)
Patient ID: JESSICAH CROLL, female   DOB: 01-11-1951, 61 y.o.   MRN: [id number] Patient ID: BRITANIA SHREEVE    DOB: May 06, 1951, 61 y.o.   MRN: [id number] --- Subjective:  Raven [name] is a 61 y.o.female who presents for follow up on medications. She was seen by Dr.Corey last week because of increased pain on percocet. She was switched back to morphine 15mg  three times daily at the time. She says that her pain has been present but overall better controled on morphine.  She is open to the possibility of being referred to the pain clinic to see if other treatment modalities can be explored.  Denies any bowel or urine incontinence, any numbness or weakness.  She also complains of left sided jaw pain that radiates to her left ear. She has not been to the dentist yet to pull out an infected tooth on that side.  Objective: Filed Vitals:   07/07/11 1357  BP: 119/74  Pulse: 87    Physical Examination:   General appearance - alert, well appearing, and in no distress Ears - bilateral TM's and external ear canals normal Chest - clear to auscultation, no wheezes, rales or rhonchi, symmetric air entry Heart - normal rate, regular rhythm, normal S1, S2, no murmurs, rubs, clicks or gallops Abdomen - soft, nontender, nondistended, no masses or organomegaly Extremities - peripheral pulses normal, no pedal edema, no clubbing or cyanosis, mild tenderness to palpation along the left shin. No loss of sensation. Normal strength in both extremities.  Back - tenderness to palpation along the left side of the lower lumbar spine

## 2011-07-11 ENCOUNTER — Telehealth: Payer: Self-pay | Admitting: *Deleted

## 2011-07-11 NOTE — Telephone Encounter (Signed)
Attempted to call patient no answer or voicemail. If she returns call please ask her if she has any insurance. There is NO CARD SCANNED into the chart. Dr Gwenlyn Saran put in referral for pain management.Donelle Hise, Rodena Medin

## 2011-07-13 NOTE — Telephone Encounter (Signed)
Patient has not returned call.[name], Rodena Medin

## 2011-08-04 ENCOUNTER — Ambulatory Visit (INDEPENDENT_AMBULATORY_CARE_PROVIDER_SITE_OTHER): Payer: Self-pay | Admitting: Family Medicine

## 2011-08-04 ENCOUNTER — Encounter: Payer: Self-pay | Admitting: Family Medicine

## 2011-08-04 VITALS — BP 102/72 | HR 80 | Temp 97.8°F | Ht 63.0 in | Wt 162.2 lb

## 2011-08-04 DIAGNOSIS — Z0000 Encounter for general adult medical examination without abnormal findings: Secondary | ICD-10-CM

## 2011-08-04 DIAGNOSIS — G4700 Insomnia, unspecified: Secondary | ICD-10-CM

## 2011-08-04 DIAGNOSIS — M2550 Pain in unspecified joint: Secondary | ICD-10-CM

## 2011-08-04 DIAGNOSIS — M5430 Sciatica, unspecified side: Secondary | ICD-10-CM

## 2011-08-04 DIAGNOSIS — K5900 Constipation, unspecified: Secondary | ICD-10-CM

## 2011-08-04 MED ORDER — CYCLOBENZAPRINE HCL 10 MG PO TABS: 10.0000 mg | ORAL_TABLET | Freq: Three times a day (TID) | ORAL | Status: AC | PRN

## 2011-08-04 MED ORDER — MORPHINE SULFATE 15 MG PO TABS: 15.0000 mg | ORAL_TABLET | Freq: Three times a day (TID) | ORAL | Status: DC | PRN

## 2011-08-04 NOTE — Patient Instructions (Signed)
For your pain in your foot, I will be checking your uric acid level to see if there could be some gout causing the pain. Please take an ibuprofen today and see if it helps. I will let you know what the result is and if it high, I will prescribe a medicine for it. For the increased back pain, I am prescribing a muscle relaxant called: flexeril that you can take three times daily but do nodrive before taking it as it can make you sleepy.   For the constipation, continue taking miralax at night.   I am also checking a component of your cholesterol to make sure your levels are ok.   For sleep, you can take melatonin: that you can find at the drug store. Make sure and take the synthetic kind.  Also, pick a time to go to sleep on a regular basis.

## 2011-08-05 DIAGNOSIS — G4700 Insomnia, unspecified: Secondary | ICD-10-CM | POA: Insufficient documentation

## 2011-08-05 DIAGNOSIS — K5903 Drug induced constipation: Secondary | ICD-10-CM | POA: Insufficient documentation

## 2011-08-05 LAB — LDL CHOLESTEROL, DIRECT: Direct LDL: 136 mg/dL — ABNORMAL HIGH

## 2011-08-05 NOTE — Progress Notes (Signed)
Patient ID: LAVETTA GEIER, female   DOB: 11/21/1950, 61 y.o.   MRN: [id number] Patient ID: VALERIA BOZA    DOB: 09/10/1950, 61 y.o.   MRN: [id number] --- Subjective:  Lavette is a 62 y.o.female who presents with pain in her left lower leg and lower back, which is chronic for her and has been a bit worst in the last couple of days. She feels like she is having extra spasms in her lower back. Denies any urinary or bowel incontinence. Denies any worsening weakness of numbness.   - trouble sleeping: having trouble falling asleep. Falls asleep around 1 or 2am after watching TV and doesn't sleep more than 4hrs a night. Denies any wondering thoughts or anxiety before bedtime. Denies any alcohol or caffeine use during the day.   - constipation: continues having some constipation. Doesn't take miralax on a very regular basis. Would like to try dulcolax.    Objective: Filed Vitals:   08/04/11 1329  BP: 102/72  Pulse: 80  Temp: 97.8 F (36.6 C)    Physical Examination:   General appearance - alert, well appearing, and in mild distress with moving of her leg during exam Chest - clear to auscultation, no wheezes, rales or rhonchi, symmetric air entry Heart - normal rate, regular rhythm, normal S1, S2, no murmurs, rubs, clicks or gallops Abdomen - soft, nontender, nondistended, no masses or organomegaly Extremities - peripheral pulses normal, no pedal edema Left leg: pain to palpation along left big toe, no warmth, mild erythema. Pain with extension and flexion of the left knee.  Back: pain with palpation along left paraspinal muscle with some spasm present. Mild tenderness along Lumbar and sacral spine.

## 2011-08-05 NOTE — Assessment & Plan Note (Signed)
Secondary to chronic narcotic use. Recommended that patient take miralax daily at bedtime if she is concerned of going to the bathroom too frequently during the day.

## 2011-08-05 NOTE — Assessment & Plan Note (Signed)
Talked about sleep hygiene and need to establish regular time to sleep. Recommended trial of melatonin.

## 2011-08-05 NOTE — Assessment & Plan Note (Addendum)
POint tenderness in left toe suspicious for gout. Will therefore check uric acid level.  Otherwise, left leg pain and back pain most likely due to sciatica. Patient will apply for orange card which will then help her obtain physical therapy. Patient to continue morphine 15mg  tid, gabapentin and naproxen. With presence of muscle spasms in lower back, prescribed short course of flexeril.

## 2011-08-08 ENCOUNTER — Telehealth: Payer: Self-pay | Admitting: Family Medicine

## 2011-08-08 NOTE — Telephone Encounter (Signed)
Called patient and left a message letting her know that the uric acid level was normal and so that gout was most likely not causing her pain. Also let her know of her cholesterol level which doesn't require medication at this point.

## 2011-09-06 ENCOUNTER — Other Ambulatory Visit: Payer: Self-pay | Admitting: Family Medicine

## 2011-09-06 ENCOUNTER — Telehealth: Payer: Self-pay | Admitting: Family Medicine

## 2011-09-06 DIAGNOSIS — M5430 Sciatica, unspecified side: Secondary | ICD-10-CM

## 2011-09-06 MED ORDER — CYCLOBENZAPRINE HCL 10 MG PO TABS: 10.0000 mg | ORAL_TABLET | Freq: Every evening | ORAL | Status: DC | PRN

## 2011-09-06 NOTE — Telephone Encounter (Signed)
Printed Rx for flexeril and left it at the front desk.

## 2011-09-06 NOTE — Telephone Encounter (Addendum)
Will forward message to Dr. Gwenlyn Saran. Discussed with her and she will address. Patient notified.

## 2011-09-06 NOTE — Telephone Encounter (Signed)
Pt had to resch appt due to [facility] not being here.  This med was supposed to be sent in by pharmacy and she hasn't heard anything,  Pt is now out.  cyclobenzaprine (FLEXERIL) 10 MG tablet [facility] [address]

## 2011-09-07 NOTE — Telephone Encounter (Signed)
Patient notified

## 2011-09-08 ENCOUNTER — Ambulatory Visit: Payer: Self-pay | Admitting: Family Medicine

## 2011-09-11 ENCOUNTER — Encounter: Payer: Self-pay | Admitting: Family Medicine

## 2011-09-11 ENCOUNTER — Ambulatory Visit (INDEPENDENT_AMBULATORY_CARE_PROVIDER_SITE_OTHER): Payer: Self-pay | Admitting: Family Medicine

## 2011-09-11 VITALS — BP 144/85 | HR 92 | Temp 98.0°F | Ht 63.0 in | Wt 162.1 lb

## 2011-09-11 DIAGNOSIS — I10 Essential (primary) hypertension: Secondary | ICD-10-CM

## 2011-09-11 DIAGNOSIS — M5430 Sciatica, unspecified side: Secondary | ICD-10-CM

## 2011-09-11 MED ORDER — NAPROXEN 500 MG PO TABS: 500.0000 mg | ORAL_TABLET | Freq: Two times a day (BID) | ORAL | Status: DC

## 2011-09-11 MED ORDER — GABAPENTIN 600 MG PO TABS: 1200.0000 mg | ORAL_TABLET | Freq: Three times a day (TID) | ORAL | Status: DC

## 2011-09-11 MED ORDER — MORPHINE SULFATE 15 MG PO TABS: 15.0000 mg | ORAL_TABLET | Freq: Three times a day (TID) | ORAL | Status: DC | PRN

## 2011-09-11 MED ORDER — IBUPROFEN 800 MG PO TABS: 800.0000 mg | ORAL_TABLET | Freq: Three times a day (TID) | ORAL | Status: DC | PRN

## 2011-09-11 NOTE — Patient Instructions (Signed)
It was great seeing you today!  The only thing I am changing today is the gabapentin which I am going to increase to 1200mg  three times daily. You are currently taking 800mg  three times daily. If you feel like you are getting too drowsy on that dose, call the clinic, an I will get you back to the old dose. Take 2 pills of the 600mg  three times daily.

## 2011-09-12 NOTE — Assessment & Plan Note (Addendum)
Initial BP on triage was elevated probably secondary to patient missing dose in the morning and being upset about running late. Rechecked bp which was normal at 120/70. Continue lisinopril HCTZ 1/2 pill 20/25

## 2011-09-12 NOTE — Assessment & Plan Note (Addendum)
Patient's pain and mobility seems to be mildly improved with use of flexeril. Will decrease from flexeril 10 tid to qhs to see if this still has an effect. Will also increase gabapentin from 800 tid to max dose of 1200 tid. Counseled patients about sedating side effects and to call clinic and decrease dose if this was causing too much sedation. Patient expressed understanding.  Continue morphine 15mg  tid and naproxen.  Patient is now eligible to apply for medicaid and will hopefully be accepted. This will allow for easier referral to physical therapy, which she could benefit from.  Left tibial trauma: point tenderness most likely from acute hit to the area a couple of days ago. Will monitor for improvement. If no improvement, will xray leg.

## 2011-09-12 NOTE — Progress Notes (Signed)
Patient ID: Raven [name], female   DOB: [birth year], 61 y.o.   MRN: [id number] Patient ID: Raven [name]    DOB: Apr 29, 1951, 61 y.o.   MRN: [id number] --- Subjective:  Raven [name] is a 61 y.o.female with h/o left sciatica, hypertension and asthma who presents for follow up appointment for medication refill. - Back and left leg pain: stable from last month. Flexeril 10mg  has helped with the feeling of muscle tightness in her back. She feels more mobile since she has been taking it. She was able to walk for 3-4 hrs a couple of days ago. She uses her walker when out of the home, but doesn't use it at home. She reports pain better with bending forward vs standing straight. She rates her pain as a 6/10 today. She has had some mild drowsiness with flexeril when taking it during the day. Denies any new weakness, bowel or urinary incontinence.  Functional status: is able to do laundry and cooking. She sometimes needs to sit down while cooking, but finds ways around it. Continues going on outings with her sister during the day.  - Leg pain: not as heavy as usual. No tingling in the lower extremity. Chronic numbness at the bottom of her foot and pain along the lateral aspect of her leg. Denies any weakness. Hit her left shin on the washing machine a few days ago.  - HTN: did not take bp medicine this morning since she was running late. Felt very rushed coming to appointment this morning and was afraid she would be late. She denies any chest pain, lower extremity swelling, shortness of breath.  Objective: Filed Vitals:   09/11/11 1129  BP: 144/85  Pulse: 92  Temp: 98 F (36.7 C)  Repeat BP after patient was in the room for a while: 122/70  Physical Examination:   General appearance - alert, well appearing, and in no distress Chest - clear to auscultation, no wheezes, rales or rhonchi, symmetric air entry Heart - normal rate, regular rhythm, normal S1, S2, no murmurs, rubs, clicks or gallops Abdomen - soft,  nontender, nondistended, no masses or organomegaly Extremities - peripheral pulses normal Left leg: tenderness along lateral aspect of leg, along muscle. Tenderness on mid tibia, at area where patient hit her leg.  Back - muscle tightness along left lower para-spinal muscle. Mild tenderness along lumbar and sacral spine.

## 2011-09-15 ENCOUNTER — Telehealth: Payer: Self-pay | Admitting: Family Medicine

## 2011-09-15 ENCOUNTER — Ambulatory Visit: Payer: Self-pay | Admitting: Family Medicine

## 2011-09-15 NOTE — Telephone Encounter (Signed)
Pt is having trouble getting the gabapentin and needs to talk to nurse about the amount that she can afford  [facility] [address]

## 2011-09-15 NOTE — Telephone Encounter (Signed)
Dr. Gwenlyn Saran sent in Rx for 90 days supply of  gabapentin  600 mg. Patient was only able to afford 30 days supply at the time. Advised that this is fine .

## 2011-09-22 ENCOUNTER — Telehealth: Payer: Self-pay | Admitting: Family Medicine

## 2011-09-22 DIAGNOSIS — M5430 Sciatica, unspecified side: Secondary | ICD-10-CM

## 2011-09-22 MED ORDER — GABAPENTIN 400 MG PO CAPS: 400.0000 mg | ORAL_CAPSULE | Freq: Three times a day (TID) | ORAL | Status: DC

## 2011-09-22 MED ORDER — GABAPENTIN 800 MG PO TABS: 800.0000 mg | ORAL_TABLET | Freq: Three times a day (TID) | ORAL | Status: DC

## 2011-09-22 NOTE — Telephone Encounter (Signed)
She is having problems affording to get the gabapentin - it is costing way too much!  Wants to know if she can change back to the 800mg  - more affordable.

## 2011-09-22 NOTE — Telephone Encounter (Signed)
Raven [name] need to have the prescription reordered for there Gabapentin to be 800 mg 90 tabs taken 3 times daily, and then 400mg  90 tabs taken 3 times daily.  This would come out much cheaper for her than the other rx written.  Please send to [facility] on [address]

## 2011-09-22 NOTE — Telephone Encounter (Signed)
Called Ms. Blowers to figure out prescription for Gabapentin. She told me that it would be less expensive to write for 800mg  tid 90 day supply and 400mg  tid 90 day supply rather than 600mg  2 tasb tid. I am not exactly sure why that is, but I will order accordingly. She reports better pain control of her left leg with the increase in dosage. Denies any significant increase in sedation. Would really like to be able to keep her on 3600 daily if she can afford it, since it seems to be controlling her symptoms.

## 2011-09-22 NOTE — Telephone Encounter (Addendum)
Spoke with patient and she states # 90 of the  Ibuprofen 800 mg taking 1 three times daily  costs $38.00 and # 90 of the 600 mg taking 2 three times daily costs $48.00. Will forward to MD.

## 2011-10-11 ENCOUNTER — Telehealth: Payer: Self-pay | Admitting: Family Medicine

## 2011-10-11 ENCOUNTER — Ambulatory Visit (INDEPENDENT_AMBULATORY_CARE_PROVIDER_SITE_OTHER): Payer: Self-pay | Admitting: Family Medicine

## 2011-10-11 ENCOUNTER — Encounter: Payer: Self-pay | Admitting: Family Medicine

## 2011-10-11 VITALS — BP 124/76 | HR 99 | Ht 63.0 in | Wt 162.0 lb

## 2011-10-11 DIAGNOSIS — M5430 Sciatica, unspecified side: Secondary | ICD-10-CM

## 2011-10-11 MED ORDER — ESOMEPRAZOLE MAGNESIUM 40 MG PO CPDR: 40.0000 mg | DELAYED_RELEASE_CAPSULE | Freq: Every day | ORAL | Status: DC

## 2011-10-11 MED ORDER — MORPHINE SULFATE 15 MG PO TABS: 15.0000 mg | ORAL_TABLET | Freq: Three times a day (TID) | ORAL | Status: DC | PRN

## 2011-10-11 NOTE — Patient Instructions (Signed)
It was nice seeing you today!  Continue with the gabapentin like you've been doing. For the pain in your leg, I want to make sure that there isn't any small fracture and will therefore be getting an xray for it.

## 2011-10-11 NOTE — Telephone Encounter (Signed)
Patient is calling because the medication for heartburn that was just prescribed was over $200.  She needs something generic that will be less expensive.  She uses [facility] at [facility].

## 2011-10-12 NOTE — Assessment & Plan Note (Signed)
Pain better controlled with increase in gabapentin. Continue current pain regimen. Will also get tib fib xray to rule out any fracture, given that patient has some persistent point tenderness along the tibia.

## 2011-10-12 NOTE — Progress Notes (Signed)
Patient ID: Raven [name], female   DOB: Oct 17, 1950, 61 y.o.   MRN: [id number] Patient ID: Raven [name]    DOB: May 17, 1951, 61 y.o.   MRN: [id number] --- Subjective:  Raven [name] is a 61 y.o.female who presents for follow up visit for her back pain and left leg pain. Pain in her back that radiates to her leg and foot has improved since the increase in gabapentin dose to 1200 mg tid. She is tolerating the increase in medication without any significant drowsiness. She is able to walk with a walker and has been able to keep mobile as much as possible. She denies any increased weakness or numbness in her left leg. Pain is worst when she stands straight. Denies any bowel or urinary incontinence.  ROS: see HPI Past Medical History: reviewed and updated medications and allergies. Social History: Tobacco: current user.   Objective: Filed Vitals:   10/11/11 1420  BP: 124/76  Pulse: 99    Physical Examination:   General appearance - alert, well appearing, and in no distress Chest - clear to auscultation, no wheezes, rales or rhonchi, symmetric air entry Heart - normal rate, regular rhythm, normal S1, S2, no murmurs, rubs, clicks or gallops Abdomen - soft, nontender, nondistended, no masses or organomegaly Extremities - no pedal edema, 5/5 strength in lower extremities bilaterally, point tenderness along left tibia

## 2011-11-10 ENCOUNTER — Encounter: Payer: Self-pay | Admitting: Family Medicine

## 2011-11-10 ENCOUNTER — Ambulatory Visit (INDEPENDENT_AMBULATORY_CARE_PROVIDER_SITE_OTHER): Payer: Self-pay | Admitting: Family Medicine

## 2011-11-10 VITALS — BP 125/70 | HR 79 | Ht 63.0 in | Wt 163.5 lb

## 2011-11-10 DIAGNOSIS — M5430 Sciatica, unspecified side: Secondary | ICD-10-CM

## 2011-11-10 DIAGNOSIS — K5909 Other constipation: Secondary | ICD-10-CM

## 2011-11-10 DIAGNOSIS — K5903 Drug induced constipation: Secondary | ICD-10-CM

## 2011-11-10 DIAGNOSIS — I10 Essential (primary) hypertension: Secondary | ICD-10-CM

## 2011-11-10 DIAGNOSIS — E785 Hyperlipidemia, unspecified: Secondary | ICD-10-CM

## 2011-11-10 MED ORDER — IBUPROFEN 800 MG PO TABS: 800.0000 mg | ORAL_TABLET | Freq: Three times a day (TID) | ORAL | Status: DC | PRN

## 2011-11-10 MED ORDER — SENNA-DOCUSATE SODIUM 8.6-50 MG PO TABS: 2.0000 | ORAL_TABLET | Freq: Every day | ORAL | Status: DC

## 2011-11-10 MED ORDER — LISINOPRIL-HYDROCHLOROTHIAZIDE 20-25 MG PO TABS: ORAL_TABLET | ORAL | Status: DC

## 2011-11-10 NOTE — Patient Instructions (Addendum)
For your cholesterol, come early in the morning before you see me next time. You can make a lab appointment. Please come fasting after midnight. You can take your medicine with water before. You may want to hold on the naproxen until you're able to eat.  For the constipation, we'll start miralax twice daily and senokot 2 tabs daily, until you have a bowel movement regularly, soft.  After that, you can continue with the senokot if you like it. Another medication would be colace.

## 2011-11-11 MED ORDER — LISINOPRIL-HYDROCHLOROTHIAZIDE 20-25 MG PO TABS: ORAL_TABLET | ORAL | Status: DC

## 2011-11-11 NOTE — Progress Notes (Signed)
Patient ID: [name]    DOB: 13-Nov-1950, 61 y.o.   MRN: [id number] --- Subjective:  Raven [name] is a 61 y.o.female with h/o left sciatica, hypertension and asthma who presents for follow up appointment.  - back pain and left leg pain: stable since last visit. Gabapentin at increased dose has helped. Better with walking. No lower extremity weakness. No bowel or urinary incontinence. Did not get xray of left leg.  - constipation: bowel movement every 2-3 days, hard. No blood in stool. Feeling of bloating associated with this. Has not been taking miralax. No abdominal pain.   Objective: Filed Vitals:   11/10/11 1422  BP: 125/70  Pulse: 79    Physical Examination:   General appearance - alert, well appearing, and in no distress Chest - clear to auscultation, no wheezes, rales or rhonchi, symmetric air entry Heart - normal rate, regular rhythm, normal S1, S2, no murmurs, rubs, clicks or gallops Abdomen - soft, nontender, nondistended, no masses or organomegaly Extremities - peripheral pulses normal Left leg: tenderness along mid tibia, otherwise non tender.   Back - minimal tenderness along left paraspinal muscle in lumbar aspect. No spinal tenderness. Normal strength in lower extremities bilaterally.

## 2011-11-11 NOTE — Assessment & Plan Note (Signed)
Non compliant with miralax, but patient is willing to try again. Miralax up to twice daily with senokot until daily and soft bowel movement. After that, can continue agent of choice.

## 2011-11-11 NOTE — Assessment & Plan Note (Signed)
Patient has not yet obtained left leg x-ray. Otherwise, symptoms are stable. Continue gabapentin 3600mg  daily which she is tolerating. Continue morphine 15mg  tid and naproxen

## 2011-11-13 ENCOUNTER — Telehealth: Payer: Self-pay | Admitting: Family Medicine

## 2011-11-13 NOTE — Telephone Encounter (Signed)
Pt stated that she will be out of her Morphine by Thursday and is requesting a refill. Pt stated that she was here Friday and forgot to ask for a refill.[facility]'

## 2011-11-14 ENCOUNTER — Telehealth: Payer: Self-pay | Admitting: Family Medicine

## 2011-11-14 ENCOUNTER — Other Ambulatory Visit: Payer: Self-pay | Admitting: Family Medicine

## 2011-11-14 DIAGNOSIS — M5430 Sciatica, unspecified side: Secondary | ICD-10-CM

## 2011-11-14 MED ORDER — MORPHINE SULFATE 15 MG PO TABS: 15.0000 mg | ORAL_TABLET | Freq: Three times a day (TID) | ORAL | Status: DC | PRN

## 2011-11-14 NOTE — Telephone Encounter (Signed)
Called patient to let her know that the Rx for morphine would be ready for her to pick up tomorrow morning. She agreed.

## 2011-11-14 NOTE — Telephone Encounter (Signed)
Put Rx for morphine 15mg  tid at front desk for patient to pick up.

## 2011-11-14 NOTE — Telephone Encounter (Signed)
Patient is calling back about the refill on her Morphine, she has enough for 2 days and wants this refill before she runs out.

## 2011-12-01 ENCOUNTER — Encounter: Payer: Self-pay | Admitting: Family Medicine

## 2011-12-01 ENCOUNTER — Ambulatory Visit (INDEPENDENT_AMBULATORY_CARE_PROVIDER_SITE_OTHER): Payer: Self-pay | Admitting: Family Medicine

## 2011-12-01 VITALS — BP 119/76 | HR 94 | Temp 98.2°F | Ht 63.0 in | Wt 161.0 lb

## 2011-12-01 DIAGNOSIS — M5430 Sciatica, unspecified side: Secondary | ICD-10-CM

## 2011-12-01 DIAGNOSIS — M25519 Pain in unspecified shoulder: Secondary | ICD-10-CM

## 2011-12-01 DIAGNOSIS — E785 Hyperlipidemia, unspecified: Secondary | ICD-10-CM

## 2011-12-01 DIAGNOSIS — M25512 Pain in left shoulder: Secondary | ICD-10-CM

## 2011-12-01 MED ORDER — NAPROXEN 500 MG PO TABS: 500.0000 mg | ORAL_TABLET | Freq: Two times a day (BID) | ORAL | Status: DC

## 2011-12-01 MED ORDER — IBUPROFEN 800 MG PO TABS: 800.0000 mg | ORAL_TABLET | Freq: Three times a day (TID) | ORAL | Status: DC | PRN

## 2011-12-01 MED ORDER — MORPHINE SULFATE 15 MG PO TABS: 15.0000 mg | ORAL_TABLET | Freq: Three times a day (TID) | ORAL | Status: DC | PRN

## 2011-12-01 MED ORDER — GABAPENTIN 400 MG PO CAPS: 400.0000 mg | ORAL_CAPSULE | Freq: Three times a day (TID) | ORAL | Status: DC

## 2011-12-01 MED ORDER — GABAPENTIN 800 MG PO TABS: 800.0000 mg | ORAL_TABLET | Freq: Three times a day (TID) | ORAL | Status: DC

## 2011-12-01 NOTE — Progress Notes (Signed)
Patient ID: ANMOL PASCHEN, female   DOB: [birth year], 61 y.o.   MRN: [id number] Patient ID: WYNETTE JERSEY    DOB: 01-04-1951, 61 y.o.   MRN: [id number] --- Subjective:  Anishka is a 61 y.o.female with h/o lumbar stenosis and L5-S1 annular tear, who presents for routine monthly visit for back and leg pain.  Shoulder pain:  Onset: 1 week ago. No trauma no heavy lifting Time period of: 1 week Severity: moderate Course of symptoms over time: stable Aggravating: worst with internal rotation while unhooking her bra Alleviating: not doing aggravating movement. Not taking any extra pain medicine for it Associated sx/sn: no weakness, tingling, numbness or loss of sensation. No problems in her hand.    Back pain:  Flare today. 10+/10. No weakness, no new numbness, no urinary or bowel incontinence.  Feeling some abdominal discomfort that feels like heartburn. No chills, no fevers.   ROS: see HPI Past Medical History: reviewed and updated medications and allergies. Social History: Tobacco: current smoker  Objective: Filed Vitals:   12/01/11 0900  BP: 119/76  Pulse: 94  Temp: 98.2 F (36.8 C)    Physical Examination:   General appearance - alert, well appearing, and in no distress Chest - clear to auscultation, no wheezes, rales or rhonchi, symmetric air entry Heart - normal rate, regular rhythm, normal S1, S2, no murmurs, rubs, clicks or gallops Abdomen - soft, nontender, nondistended, no masses or organomegaly Extremities - peripheral pulses normal, no pedal edema, no clubbing or cyanosis Left shoulder - reproducible pain with internal rotation. Able to bring arm above head. Normal strength in upper extremities bilaterally.  Back - Tenderness along right and left lumbar paraspinal spine.

## 2011-12-01 NOTE — Patient Instructions (Signed)
For the shoulder, it doesn't look like you have a rotator cuff tear or anything like that. It doesn't look like it needs an infection either. If it gets worst, we might consider it. You can take some of the ibuprofen for a couple of days to help with the acute pain.   Shoulder Pain The shoulder is a ball and socket joint. The muscles and tendons (rotator cuff) are what keep the shoulder in its joint and stable. This collection of muscles and tendons holds in the head (ball) of the humerus (upper arm bone) in the fossa (cup) of the scapula (shoulder blade). Today no reason was found for your shoulder pain. Often pain in the shoulder may be treated conservatively with temporary immobilization. For example, holding the shoulder in one place using a sling for rest. Physical therapy may be needed if problems continue. HOME CARE INSTRUCTIONS   Apply ice to the sore area for 15 to 20 minutes, 3 to 4 times per day for the first 2 days. Put the ice in a plastic bag. Place a towel between the bag of ice and your skin.   If you have or were given a shoulder sling and straps, do not remove for as long as directed by your caregiver or until you see a caregiver for a follow-up examination. If you need to remove it to shower or bathe, move your arm as little as possible.   Sleep on several pillows at night to lessen swelling and pain.   Only take over-the-counter or prescription medicines for pain, discomfort, or fever as directed by your caregiver.   Keep any follow-up appointments in order to avoid any type of permanent shoulder disability or chronic pain problems.  SEEK MEDICAL CARE IF:   Pain in your shoulder increases or new pain develops in your arm, hand, or fingers.   Your hand or fingers are colder than your other hand.   You do not obtain pain relief with the medications or your pain becomes worse.  SEEK IMMEDIATE MEDICAL CARE IF:   Your arm, hand, or fingers are numb or tingling.   Your arm,  hand, or fingers are swollen, painful, or turn white or blue.   You develop chest pain or shortness of breath.  MAKE SURE YOU:   Understand these instructions.   Will watch your condition.   Will get help right away if you are not doing well or get worse.  Document Released: 02/15/2005 Document Revised: 04/27/2011 Document Reviewed: 04/22/2011 [facility] Patient Information 2012 [place], [place].

## 2011-12-01 NOTE — Assessment & Plan Note (Addendum)
Pain not well controled today, although typical for her flares of pain. No red flags, such as urinary or bowel incontinence, no weakness, no foot drop.  Refilled morphine 15mg  tid, gabapentin 1200mg  tid, naproxen and ibuprofen. Patient to take flexeril at night to help with muscle spasms evident on exam. Continue ranitidine for gastritis from NSAID. Also continue miralax for narcotic constipation

## 2011-12-01 NOTE — Assessment & Plan Note (Signed)
Most likely mild bursitis. No evidence of rotator cuff tear. Recommended continued use of shoulder. If worsens, may need injection although no need at this point.  Patient can take burst of ibuprofen for 2 days to help with inflammation. Patient to return if worsening.

## 2011-12-28 ENCOUNTER — Encounter: Payer: Self-pay | Admitting: Family Medicine

## 2011-12-28 ENCOUNTER — Ambulatory Visit (INDEPENDENT_AMBULATORY_CARE_PROVIDER_SITE_OTHER): Payer: Self-pay | Admitting: Family Medicine

## 2011-12-28 VITALS — BP 160/84 | HR 100 | Ht 63.0 in | Wt 158.0 lb

## 2011-12-28 DIAGNOSIS — M5430 Sciatica, unspecified side: Secondary | ICD-10-CM

## 2011-12-28 DIAGNOSIS — E785 Hyperlipidemia, unspecified: Secondary | ICD-10-CM

## 2011-12-28 MED ORDER — CYCLOBENZAPRINE HCL 10 MG PO TABS: 10.0000 mg | ORAL_TABLET | Freq: Every evening | ORAL | Status: DC | PRN

## 2011-12-28 MED ORDER — MORPHINE SULFATE 15 MG PO TABS: 15.0000 mg | ORAL_TABLET | Freq: Three times a day (TID) | ORAL | Status: DC | PRN

## 2011-12-28 NOTE — Assessment & Plan Note (Signed)
Check fasting lipid at next visit.

## 2011-12-28 NOTE — Assessment & Plan Note (Addendum)
Back pain stable, able to stay active. Continue morphine 15 tid, flexeril 10 bid, gabapentin 1200tid, ibuprofen sparingly and naproxen bid. Will check BMP at next visit when fasting to monitor Cr in the setting of high NSAID use. I was hoping to prescribe tylenol extra strength but patient states that she tried it in the past and it has not helped.

## 2011-12-28 NOTE — Progress Notes (Signed)
Patient ID: Raven [name], female   DOB: 1951/02/25, 61 y.o.   MRN: [id number] Patient ID: Raven [name]    DOB: [birth year], 61 y.o.   MRN: [id number] --- Subjective:  Raven [name] is a 61 y.o.female who presents for follow up on left leg and back pain.  Back and left leg pain: stable. Controled with flexeril 10mg  twice daily, ibuprofen 800mg  three times weekly, naproxen twice daily and morphine 15mg  tid and gabapentin 1200mg  tid. Feels that muscle relaxant is very helpful. Has been able to walk and stay active using walker outside of the house. This last week went shopping and was able to walk with help of cart. Was out from 9:00am to 3pm and felt tired but felt like her back and leg pain were controled. No increase weakness, no loss of bowel or urine. No increased weakness.    ROS: see HPI Past Medical History: reviewed and updated medications and allergies. Social History: Tobacco: current smoker  Objective: Filed Vitals:   12/28/11 0830  BP: 160/84  Pulse: 100  Repeat BP: 130/68  Physical Examination:   General appearance - alert, well appearing, and in no distress Chest - clear to auscultation, no wheezes, rales or rhonchi, symmetric air entry Heart - normal rate, regular rhythm, normal S1, S2, no murmurs, rubs, clicks or gallops MSK - tenderness to palpation along left hip. No tenderness along back. No point tenderness along left leg. Normal range of motion.

## 2012-01-23 ENCOUNTER — Encounter: Payer: Self-pay | Admitting: Family Medicine

## 2012-01-23 ENCOUNTER — Ambulatory Visit (INDEPENDENT_AMBULATORY_CARE_PROVIDER_SITE_OTHER): Payer: Self-pay | Admitting: Family Medicine

## 2012-01-23 VITALS — BP 126/70 | Temp 99.1°F | Ht 63.0 in | Wt 159.0 lb

## 2012-01-23 DIAGNOSIS — M5430 Sciatica, unspecified side: Secondary | ICD-10-CM

## 2012-01-23 DIAGNOSIS — E785 Hyperlipidemia, unspecified: Secondary | ICD-10-CM

## 2012-01-23 DIAGNOSIS — I10 Essential (primary) hypertension: Secondary | ICD-10-CM

## 2012-01-23 MED ORDER — GABAPENTIN 400 MG PO CAPS: 400.0000 mg | ORAL_CAPSULE | Freq: Three times a day (TID) | ORAL | Status: DC

## 2012-01-23 MED ORDER — GABAPENTIN 800 MG PO TABS: 800.0000 mg | ORAL_TABLET | Freq: Three times a day (TID) | ORAL | Status: DC

## 2012-01-23 MED ORDER — MORPHINE SULFATE 15 MG PO TABS: 15.0000 mg | ORAL_TABLET | Freq: Three times a day (TID) | ORAL | Status: DC | PRN

## 2012-01-23 NOTE — Assessment & Plan Note (Signed)
Fasting Lipid panel obtained today

## 2012-01-23 NOTE — Progress Notes (Signed)
Patient ID: Raven [name], female   DOB: 1950-12-12, 61 y.o.   MRN: [id number] Patient ID: Raven [name]    DOB: 01-Feb-1951, 61 y.o.   MRN: [id number] --- Subjective:  Raven [name] is a 61 y.o.female who presents for follow up.  - left back and left leg pain: no change in level of pain since last visit. Pain is bearable and patient states she is able to go on outings with her walker. She is able to do her ADL's at home. No weakness, no loss of sensation, no loss of bowel or urine. There have not been any recent changes in her medications. She reports that she cannot find her last Rx for morphine and may run out before next appointment.  - Medicaid: reports that she is working on obtaining medicaid and that paperwork is in process.   ROS: see HPI Past Medical History: reviewed and updated medications and allergies. Social History: Tobacco: current smoker.   Objective: There were no vitals filed for this visit.  Physical Examination:   General appearance - alert, well appearing, and in no distress Neck - supple, no significant adenopathy Chest - clear to auscultation, no wheezes, rales or rhonchi, symmetric air entry Heart - normal rate, regular rhythm, normal S1, S2, no murmurs, rubs, clicks or gallops Extremities - peripheral pulses normal, no pedal edema, no clubbing or cyanosis MSK - no midline cervical, thoracic or lumbar spinal tenderness, tenderness to palpation along right sciatic notch and left lumbar paraspinal muscles. Normal range of motion of hip. No tenderness along left greater trochanter. Tenderness to palpation along left mid tibia. Normal range of motion of ankle and knee. 5/5 strength with knee extension, flexion, plantarflexion and dorsiflexion bilaterally.

## 2012-01-23 NOTE — Patient Instructions (Addendum)
I will call you if anything is abnormal with the lab work. Otherwise, I will see you next month.

## 2012-01-23 NOTE — Assessment & Plan Note (Addendum)
Pain is controled with morphine 15mg  tid, gabapentin 1200mg  tid, naproxen bid and ibuprofen 800mg  1-2x/week. Patient has been very compliant in regards to her morphine (making her appointments every month and not asking for medications in advance). I do believe that she is telling the truth about loosing her Rx. I will give her another one for this time.  Refilled gabapentin. No signs of drowsiness, forgetfulness or dyskinesia.  Obtained BMP to check renal function given patient's use of naproxen and occasional 800mg  ibuprofen.  Patient continues having point tenderness along tibia for which I had ordered xray which patient did not get. Reiterated the importance of getting xray to make sure there are no fractures

## 2012-01-23 NOTE — Assessment & Plan Note (Signed)
Controled on lisinopril hctz 1/2 pill. Continue management

## 2012-01-24 LAB — LIPID PANEL
HDL: 41 mg/dL (ref >39)
LDL Cholesterol: 136 mg/dL — ABNORMAL HIGH (ref 0–99)
Total CHOL/HDL Ratio: 5.3 Ratio
VLDL: 41 mg/dL — ABNORMAL HIGH (ref 0–40)

## 2012-01-24 LAB — BASIC METABOLIC PANEL
CO2: 22 mEq/L (ref 19–32)
Chloride: 102 mEq/L (ref 96–112)
Creat: 0.96 mg/dL (ref 0.50–1.10)
Potassium: 4.3 mEq/L (ref 3.5–5.3)
Sodium: 134 mEq/L — ABNORMAL LOW (ref 135–145)

## 2012-02-01 ENCOUNTER — Other Ambulatory Visit: Payer: Self-pay | Admitting: Family Medicine

## 2012-02-01 DIAGNOSIS — Z1231 Encounter for screening mammogram for malignant neoplasm of breast: Secondary | ICD-10-CM

## 2012-02-20 ENCOUNTER — Ambulatory Visit (HOSPITAL_COMMUNITY)
Admission: RE | Admit: 2012-02-20 | Discharge: 2012-02-20 | Disposition: A | Discharge status: Home / Self Care | Payer: Self-pay | Source: Ambulatory Visit | Attending: Family Medicine | Admitting: Family Medicine

## 2012-02-20 DIAGNOSIS — Z1231 Encounter for screening mammogram for malignant neoplasm of breast: Secondary | ICD-10-CM

## 2012-02-23 ENCOUNTER — Ambulatory Visit (INDEPENDENT_AMBULATORY_CARE_PROVIDER_SITE_OTHER): Payer: Self-pay | Admitting: Family Medicine

## 2012-02-23 ENCOUNTER — Encounter: Payer: Self-pay | Admitting: Family Medicine

## 2012-02-23 VITALS — BP 105/63 | HR 92 | Ht 63.0 in | Wt 160.0 lb

## 2012-02-23 DIAGNOSIS — E785 Hyperlipidemia, unspecified: Secondary | ICD-10-CM

## 2012-02-23 DIAGNOSIS — M5430 Sciatica, unspecified side: Secondary | ICD-10-CM

## 2012-02-23 MED ORDER — GABAPENTIN 800 MG PO TABS: 800.0000 mg | ORAL_TABLET | Freq: Three times a day (TID) | ORAL | Status: DC

## 2012-02-23 MED ORDER — GABAPENTIN 400 MG PO CAPS: 400.0000 mg | ORAL_CAPSULE | Freq: Three times a day (TID) | ORAL | Status: DC

## 2012-02-23 MED ORDER — MORPHINE SULFATE 15 MG PO TABS: 15.0000 mg | ORAL_TABLET | Freq: Three times a day (TID) | ORAL | Status: DC | PRN

## 2012-02-23 NOTE — Patient Instructions (Addendum)
You can take the muscle relaxant every 8 hours.   Fat and Cholesterol Control Diet Cholesterol levels in your body are determined significantly by your diet. Cholesterol levels may also be related to heart disease. The following material helps to explain this relationship and discusses what you can do to help keep your heart healthy. Not all cholesterol is bad. Low-density lipoprotein (LDL) cholesterol is the "bad" cholesterol. It may cause fatty deposits to build up inside your arteries. High-density lipoprotein (HDL) cholesterol is "good." It helps to remove the "bad" LDL cholesterol from your blood. Cholesterol is a very important risk factor for heart disease. Other risk factors are high blood pressure, smoking, stress, heredity, and weight. The heart muscle gets its supply of blood through the coronary arteries. If your LDL cholesterol is high and your HDL cholesterol is low, you are at risk for having fatty deposits build up in your coronary arteries. This leaves less room through which blood can flow. Without sufficient blood and oxygen, the heart muscle cannot function properly and you may feel chest pains (angina pectoris). When a coronary artery closes up entirely, a part of the heart muscle may die causing a heart attack (myocardial infarction). CHECKING CHOLESTEROL When your caregiver sends your blood to a lab to be examined for cholesterol, a complete lipid (fat) profile may be done. With this test, the total amount of cholesterol and levels of LDL and HDL are determined. Triglycerides are a type of fat that circulates in the blood. They can also be used to determine heart disease risk. The list below describes what the numbers should be: Test: Total Cholesterol.  Less than 200 mg/dl. Test: LDL "bad cholesterol."  Less than 100 mg/dl.  Less than 70 mg/dl if you are at very high risk of a heart attack or sudden cardiac death. Test: HDL "good cholesterol."  Greater than 50 mg/dl for  women.  Greater than 40 mg/dl for men. Test: Triglycerides.  Less than 150 mg/dl. CONTROLLING CHOLESTEROL WITH DIET Although exercise and lifestyle factors are important, your diet is key. That is because certain foods are known to raise cholesterol and others to lower it. The goal is to balance foods for their effect on cholesterol and more importantly, to replace saturated and trans fat with other types of fat, such as monounsaturated fat, polyunsaturated fat, and omega-3 fatty acids. On average, a person should consume no more than 15 to 17 g of saturated fat daily. Saturated and trans fats are considered "bad" fats, and they will raise LDL cholesterol. Saturated fats are primarily found in animal products such as meats, butter, and cream. However, that does not mean you need to give up all your favorite foods. Today, there are good tasting, low-fat, low-cholesterol substitutes for most of the things you like to eat. Choose low-fat or nonfat alternatives. Choose round or loin cuts of red meat. These types of cuts are lowest in fat and cholesterol. Chicken (without the skin), fish, veal, and ground [place] breast are great choices. Eliminate fatty meats, such as hot dogs and salami. Even shellfish have little or no saturated fat. Have a 3 oz (85 g) portion when you eat lean meat, poultry, or fish. Trans fats are also called "partially hydrogenated oils." They are oils that have been scientifically manipulated so that they are solid at room temperature resulting in a longer shelf life and improved taste and texture of foods in which they are added. Trans fats are found in stick margarine, some tub margarines,  cookies, crackers, and baked goods.  When baking and cooking, oils are a great substitute for butter. The monounsaturated oils are especially beneficial since it is believed they lower LDL and raise HDL. The oils you should avoid entirely are saturated tropical oils, such as coconut and palm.    Remember to eat a lot from food groups that are naturally free of saturated and trans fat, including fish, fruit, vegetables, beans, grains (barley, rice, couscous, bulgur wheat), and pasta (without cream sauces).  IDENTIFYING FOODS THAT LOWER CHOLESTEROL  Soluble fiber may lower your cholesterol. This type of fiber is found in fruits such as apples, vegetables such as broccoli, potatoes, and carrots, legumes such as beans, peas, and lentils, and grains such as barley. Foods fortified with plant sterols (phytosterol) may also lower cholesterol. You should eat at least 2 g per day of these foods for a cholesterol lowering effect.  Read package labels to identify low-saturated fats, trans fat free, and low-fat foods at the supermarket. Select cheeses that have only 2 to 3 g saturated fat per ounce. Use a heart-healthy tub margarine that is free of trans fats or partially hydrogenated oil. When buying baked goods (cookies, crackers), avoid partially hydrogenated oils. Breads and muffins should be made from whole grains (whole-wheat or whole oat flour, instead of "flour" or "enriched flour"). Buy non-creamy canned soups with reduced salt and no added fats.  FOOD PREPARATION TECHNIQUES  Never deep-fry. If you must fry, either stir-fry, which uses very little fat, or use non-stick cooking sprays. When possible, broil, bake, or roast meats, and steam vegetables. Instead of putting butter or margarine on vegetables, use lemon and herbs, applesauce, and cinnamon (for squash and sweet potatoes), nonfat yogurt, salsa, and low-fat dressings for salads.  LOW-SATURATED FAT / LOW-FAT FOOD SUBSTITUTES Meats / Saturated Fat (g)  Avoid: Steak, marbled (3 oz/85 g) / 11 g  Choose: Steak, lean (3 oz/85 g) / 4 g  Avoid: Hamburger (3 oz/85 g) / 7 g  Choose: Hamburger, lean (3 oz/85 g) / 5 g  Avoid: Ham (3 oz/85 g) / 6 g  Choose: Ham, lean cut (3 oz/85 g) / 2.4 g  Avoid: Chicken, with skin, dark meat (3 oz/85 g) / 4  g  Choose: Chicken, skin removed, dark meat (3 oz/85 g) / 2 g  Avoid: Chicken, with skin, light meat (3 oz/85 g) / 2.5 g  Choose: Chicken, skin removed, light meat (3 oz/85 g) / 1 g Dairy / Saturated Fat (g)  Avoid: Whole milk (1 cup) / 5 g  Choose: Low-fat milk, 2% (1 cup) / 3 g  Choose: Low-fat milk, 1% (1 cup) / 1.5 g  Choose: Skim milk (1 cup) / 0.3 g  Avoid: Hard cheese (1 oz/28 g) / 6 g  Choose: Skim milk cheese (1 oz/28 g) / 2 to 3 g  Avoid: Cottage cheese, 4% fat (1 cup) / 6.5 g  Choose: Low-fat cottage cheese, 1% fat (1 cup) / 1.5 g  Avoid: Ice cream (1 cup) / 9 g  Choose: Sherbet (1 cup) / 2.5 g  Choose: Nonfat frozen yogurt (1 cup) / 0.3 g  Choose: Frozen fruit bar / trace  Avoid: Whipped cream (1 tbs) / 3.5 g  Choose: Nondairy whipped topping (1 tbs) / 1 g Condiments / Saturated Fat (g)  Avoid: Mayonnaise (1 tbs) / 2 g  Choose: Low-fat mayonnaise (1 tbs) / 1 g  Avoid: Butter (1 tbs) / 7 g  Choose: Extra  light margarine (1 tbs) / 1 g  Avoid: Coconut oil (1 tbs) / 11.8 g  Choose: Olive oil (1 tbs) / 1.8 g  Choose: Corn oil (1 tbs) / 1.7 g  Choose: Safflower oil (1 tbs) / 1.2 g  Choose: Sunflower oil (1 tbs) / 1.4 g  Choose: Soybean oil (1 tbs) / 2.4 g  Choose: Canola oil (1 tbs) / 1 g Document Released: 05/08/2005 Document Revised: 07/31/2011 Document Reviewed: 10/27/2010 [facility] Patient Information 2013 [place], [place].

## 2012-02-24 NOTE — Progress Notes (Signed)
Patient ID: Raven [name], female   DOB: 1950-07-04, 61 y.o.   MRN: [id number] Patient ID: Raven [name]    DOB: 09-May-1951, 61 y.o.   MRN: [id number] --- Subjective:  Raven [name] is a 61 y.o.female who presents for follow up on left leg pain and back pain.  Reports that back pain has gotten worst with change in weather. She also feels like her left leg is stiff. No increased weakness or foot drop. No increased loss of sensation, no bowel or urinary incontinence. Pain and stiffness eases off with movement. She has been taking morphine 15mg  tid, gabapentin 1200mg  tid, flexril 10mg  at noon and at night time and naproxen bud. Has taken ibuprofen 800mg  once this week. She feels like the muscle relaxants do help.    ROS: see HPI Past Medical History: reviewed and updated medications and allergies. Social History: Tobacco: current smoker  Objective: Filed Vitals:   02/23/12 1349  BP: 105/63  Pulse: 92    Physical Examination:   General appearance - alert, well appearing, and in no distress Chest - clear to auscultation, no wheezes, rales or rhonchi, symmetric air entry Heart - normal rate, regular rhythm, normal S1, S2, no murmurs, rubs, clicks or gallops Back - tenderness to palpation along lumbar paraspinal muscles bilaterally with increased muscle tightness, no spinal tenderness. Tenderness to palpation along left greater trochanter. 4+/5 strength knee extension and knee flexion bilaterally, numb sensation along left great toe, otherwise sensation intact. 4+/5 strength with dorsiflexion and plantarflexion of left and right feet.

## 2012-02-25 NOTE — Assessment & Plan Note (Signed)
LDL: 136. Given age and h/o HTN, goal should be less than 130. Discussed this with patient and patient would like to try diet modifications before starting statin therapy. Will redraw lipid panel in 6 months.

## 2012-02-25 NOTE — Assessment & Plan Note (Addendum)
Pain not well controled today. Based on physical exam, lower paraspinal muscles are tight and probably contributing to source fof pain. Continue with flexeril. With patient's hip pain, there could be a component of trochanteric bursitis. Will reevaluate at next visit. No red flags on exam or history.  Refilled morphine today.

## 2012-03-26 ENCOUNTER — Ambulatory Visit (INDEPENDENT_AMBULATORY_CARE_PROVIDER_SITE_OTHER): Payer: Self-pay | Admitting: Family Medicine

## 2012-03-26 ENCOUNTER — Encounter: Payer: Self-pay | Admitting: Family Medicine

## 2012-03-26 VITALS — BP 133/78 | HR 89 | Ht 63.0 in | Wt 163.0 lb

## 2012-03-26 DIAGNOSIS — M25552 Pain in left hip: Secondary | ICD-10-CM | POA: Insufficient documentation

## 2012-03-26 DIAGNOSIS — B372 Candidiasis of skin and nail: Secondary | ICD-10-CM

## 2012-03-26 DIAGNOSIS — M25559 Pain in unspecified hip: Secondary | ICD-10-CM

## 2012-03-26 DIAGNOSIS — M5430 Sciatica, unspecified side: Secondary | ICD-10-CM

## 2012-03-26 DIAGNOSIS — F17200 Nicotine dependence, unspecified, uncomplicated: Secondary | ICD-10-CM

## 2012-03-26 MED ORDER — IBUPROFEN 800 MG PO TABS: 800.0000 mg | ORAL_TABLET | Freq: Three times a day (TID) | ORAL | Status: DC | PRN

## 2012-03-26 MED ORDER — GABAPENTIN 400 MG PO CAPS: 400.0000 mg | ORAL_CAPSULE | Freq: Three times a day (TID) | ORAL | Status: DC

## 2012-03-26 MED ORDER — NYSTATIN 100000 UNIT/GM EX POWD: Freq: Four times a day (QID) | CUTANEOUS | Status: DC

## 2012-03-26 MED ORDER — NAPROXEN 500 MG PO TABS: 500.0000 mg | ORAL_TABLET | Freq: Two times a day (BID) | ORAL | Status: DC

## 2012-03-26 MED ORDER — GABAPENTIN 800 MG PO TABS: 800.0000 mg | ORAL_TABLET | Freq: Three times a day (TID) | ORAL | Status: DC

## 2012-03-26 NOTE — Assessment & Plan Note (Signed)
No evidence of cellulitis. Treat for candida with nystatin powder. If powder too expensive for patient, would change to nystatin cream

## 2012-03-26 NOTE — Progress Notes (Signed)
Patient ID: Raven [name]    DOB: April 02, 1951, 61 y.o.   MRN: [id number] --- Subjective:  Raven [name] is a 61 y.o.female with h/o annular tear L5-S1 and spinal stenosis with chronic back pain who presents for follow up on back pain - back and hip pain: back pain: stable with current medications. No new onset weakness or loss of sensation in lower extremities. She does report some worsening of left hip pain, that starts at hip level and goes to thigh. Worst with laying on it, worst when she first stands up. She has been taking morphine 15mg  twice daily as opposed to three times daily with pain controled. She takes naproxen twice daily and occasional ibuprofen 800mg  (2 times weekly), she also takes flexeril once or twice daily. She continues to take gabapentin 1200mg  three times daily without any side effects.  - rash under breast: present for over 1 month. Associated with her increased sweating with hot flashes. She has tried talcum powder which has not much helped. Mild itching, no pain.   ROS: see HPI Past Medical History: reviewed and updated medications and allergies. Social History: Tobacco: 1/2 pack per day. Plans on quitting in January 2014  Objective: Filed Vitals:   03/26/12 0925  BP: 133/78  Pulse: 89    Physical Examination:   General appearance - alert, well appearing, and in no distress Neck - supple, no significant adenopathy Chest - clear to auscultation, no wheezes, rales or rhonchi, symmetric air entry Heart - normal rate, regular rhythm, normal S1, S2, no murmurs, rubs, clicks or gallops Extremities - left hip: tenderness to palpation along greater trochanter, normal range of movement of hip bilaterally, 4+/5 strength with hip flexion, knee extension, knee flexion, dorsiflexion and plantarflexion bilaterally, sensation to light touch in lower extremities intact bilaterally.  Tenderness to palpation along lumbar spine at L5 level as well as along left paraspinal muscles.    Tenderness to palpation along left mid tibia unchanged from previous.  Skin: under breast bilaterally: erythematous candidal appearing area under breast, no warmth, no exudate.

## 2012-03-26 NOTE — Assessment & Plan Note (Signed)
Patient reports that she will try quitting in January 2014. Will follow up at that time.

## 2012-03-26 NOTE — Assessment & Plan Note (Signed)
Continue with current management. Naproxen, morphine 15mg  (patient is weaning herself from three tablets to two), flexeril as needed and gabapentin. At this point, doesn't have resources for Physical therapy which would be an ideal addition to the medical management

## 2012-03-26 NOTE — Assessment & Plan Note (Signed)
Likely from trochanteric bursitis. Discussed steroid injection with patient which she would be agreeable to. Likely steroid injection at next visit.

## 2012-03-26 NOTE — Patient Instructions (Signed)
The rash is caused by a yeast called candida. The antifungal powder should help it.   Intertrigo Intertrigo is a skin condition that occurs in between folds of skin in places on the body that rub together a lot and do not get much ventilation. It is caused by heat, moisture, friction, sweat retention, and lack of air circulation, which produces red, irritated patches and, sometimes, scaling or drainage. People who have diabetes, who are obese, or who have treatment with antibiotics are at increased risk for intertrigo. The most common sites for intertrigo to occur include:  The groin.  The breasts.  The armpits.  Folds of abdominal skin.  Webbed spaces between the fingers or toes. Intertrigo may be aggravated by:  Sweat.  Feces.  Yeast or bacteria that are present near skin folds.  Urine.  Vaginal discharge. HOME CARE INSTRUCTIONS  The following steps can be taken to reduce friction and keep the affected area cool and dry:  Expose skin folds to the air.  Keep deep skin folds separated with cotton or linen cloth. Avoid tight fitting clothing that could cause chafing.  Wear open-toed shoes or sandals to help reduce moisture between the toes.  Apply absorbent powders to affected areas as directed by your caregiver.  Apply over-the-counter barrier pastes, such as zinc oxide, as directed by your caregiver.  If you develop a fungal infection in the affected area, your caregiver may have you use antifungal creams. SEEK MEDICAL CARE IF:   The rash is not improving after 1 week of treatment.  The rash is getting worse (more red, more swollen, more painful, or spreading).  You have a fever or chills. MAKE SURE YOU:   Understand these instructions.  Will watch your condition.  Will get help right away if you are not doing well or get worse. Document Released: 05/08/2005 Document Revised: 07/31/2011 Document Reviewed: 10/21/2009 [facility] Patient Information 2013  [place], [place].

## 2012-04-07 ENCOUNTER — Emergency Department (HOSPITAL_COMMUNITY): Payer: Medicaid Other

## 2012-04-07 ENCOUNTER — Encounter (HOSPITAL_COMMUNITY): Payer: Self-pay | Admitting: Emergency Medicine

## 2012-04-07 ENCOUNTER — Emergency Department (HOSPITAL_COMMUNITY)
Admission: EM | Admit: 2012-04-07 | Discharge: 2012-04-07 | Disposition: A | Discharge status: Home / Self Care | Payer: Medicaid Other | Attending: Emergency Medicine | Admitting: Emergency Medicine

## 2012-04-07 DIAGNOSIS — M545 Low back pain, unspecified: Secondary | ICD-10-CM | POA: Insufficient documentation

## 2012-04-07 DIAGNOSIS — Z79899 Other long term (current) drug therapy: Secondary | ICD-10-CM | POA: Insufficient documentation

## 2012-04-07 DIAGNOSIS — F17200 Nicotine dependence, unspecified, uncomplicated: Secondary | ICD-10-CM | POA: Insufficient documentation

## 2012-04-07 DIAGNOSIS — G8929 Other chronic pain: Secondary | ICD-10-CM

## 2012-04-07 DIAGNOSIS — R51 Headache: Secondary | ICD-10-CM | POA: Insufficient documentation

## 2012-04-07 DIAGNOSIS — I10 Essential (primary) hypertension: Secondary | ICD-10-CM | POA: Insufficient documentation

## 2012-04-07 DIAGNOSIS — J45909 Unspecified asthma, uncomplicated: Secondary | ICD-10-CM | POA: Insufficient documentation

## 2012-04-07 DIAGNOSIS — K219 Gastro-esophageal reflux disease without esophagitis: Secondary | ICD-10-CM | POA: Insufficient documentation

## 2012-04-07 MED ORDER — HYDROMORPHONE HCL PF 1 MG/ML IJ SOLN
1.0000 mg | Freq: Once | INTRAMUSCULAR | Status: AC
  Administered 2012-04-07: 0.5 mg via INTRAMUSCULAR
  Filled 2012-04-07: qty 1

## 2012-04-07 NOTE — ED Notes (Signed)
Pain reported to be reduced; resting comfortably.

## 2012-04-07 NOTE — ED Notes (Signed)
Patient transported to X-ray 

## 2012-04-07 NOTE — ED Notes (Signed)
Lisette, PA at the bedside.  

## 2012-04-07 NOTE — ED Provider Notes (Signed)
History     CSN: [id number]  Arrival date & time 04/07/12  4098   First MD Initiated Contact with Patient 04/07/12 0930      Chief Complaint  Patient presents with  . Extremity Weakness    (Consider location/radiation/quality/duration/timing/severity/associated sxs/prior treatment) HPI Comments: Raven [name] is a 61 y.o.female with h/o annular tear L5-S1 and spinal stenosis with chronic back pain who presents to emergency department with a chief complaint of acute on chronic back pain and left lower many pain.  Patient denies any new trauma, weakness, numbness, or tingling of extremity.  There is no loss control of bowel or bladder or footdrop.  Patient denies any weight loss.  Patient reports she is able to ambulate, however there is increased pain.  She was recently evaluated by her primary care physician about 10 days ago for similar presentation, but as of yesterday her home medications are not relieving her pain (morphine 3 times a day, naproxen, gabapentin, and Flexeril).  Patient is a 61 y.o. female presenting with extremity weakness.  Extremity Weakness Associated symptoms include weakness (chronic). Pertinent negatives include no abdominal pain, arthralgias, chest pain, chills, fatigue, fever, headaches, joint swelling, myalgias, nausea, neck pain or numbness.    Past Medical History  Diagnosis Date  . Hypertension   . Spinal stenosis of lumbar region   . Asthma   . Tobacco abuse   . GERD (gastroesophageal reflux disease)   . Generalized headaches     ocasional, she uses naproxen.    Past Surgical History  Procedure Date  . Cesarean section     x2  . Tracheostomy     when she was 20    No family history on file.  History  Substance Use Topics  . Smoking status: Current Every Day Smoker -- 0.3 packs/day    Types: Cigarettes  . Smokeless tobacco: Never Used  . Alcohol Use: No    OB History    Grav Para Term Preterm Abortions TAB SAB Ect Mult Living                    Review of Systems  Constitutional: Positive for activity change. Negative for fever, chills, fatigue and unexpected weight change.  HENT: Negative for neck pain and neck stiffness.   Eyes: Negative for visual disturbance.  Respiratory: Negative for shortness of breath.   Cardiovascular: Negative for chest pain and leg swelling.  Gastrointestinal: Negative for nausea, abdominal pain, constipation and rectal pain.  Genitourinary: Negative for urgency and difficulty urinating.       Patient denies bowel and bladder incontinence.  Musculoskeletal: Positive for back pain, gait problem and extremity weakness. Negative for myalgias, joint swelling and arthralgias.  Neurological: Positive for weakness (chronic). Negative for numbness and headaches.  All other systems reviewed and are negative.    Allergies  Procaine hcl  Home Medications   Current Outpatient Rx  Name  Route  Sig  Dispense  Refill  . ALBUTEROL SULFATE HFA 108 (90 BASE) MCG/ACT IN AERS   Inhalation   Inhale 1-2 puffs into the lungs every 4 (four) hours as needed. For wheezing         . BIOTIN 10 MG PO TABS   Oral   Take 10 mg by mouth daily.          Marland Kitchen CALCIUM-MAGNESIUM-VITAMIN D ER 600-40-500 MG-MG-UNIT PO TB24   Oral   Take 1 tablet by mouth 2 (two) times daily.          Marland Kitchen  GABAPENTIN 400 MG PO CAPS   Oral   Take 400 mg by mouth 3 (three) times daily.         Marland Kitchen GABAPENTIN 800 MG PO TABS   Oral   Take 800 mg by mouth 3 (three) times daily.         . IBUPROFEN 800 MG PO TABS   Oral   Take 800 mg by mouth every 8 (eight) hours as needed. For pain         . LISINOPRIL-HYDROCHLOROTHIAZIDE 20-25 MG PO TABS   Oral   Take 0.5 tablets by mouth daily. Take 1/2 tablet by mouth daily for high blood pressure.         Marland Kitchen MORPHINE SULFATE 15 MG PO TABS   Oral   Take 15 mg by mouth 3 (three) times daily as needed. For pain         . NAPROXEN 500 MG PO TABS   Oral   Take 500 mg by mouth 2 (two)  times daily with a meal.         . RANITIDINE HCL 150 MG PO TABS   Oral   Take 150 mg by mouth daily.         . MORPHINE SULFATE 15 MG PO TABS   Oral   Take 15 mg by mouth 3 (three) times daily as needed. For pain           BP 132/74  Pulse 96  Temp 98.1 F (36.7 C) (Oral)  Resp 18  SpO2 100%  Physical Exam  Nursing note and vitals reviewed. Constitutional: She is oriented to person, place, and time. She appears well-developed and well-nourished. No distress.  HENT:  Head: Normocephalic and atraumatic.  Eyes: Conjunctivae normal and EOM are normal.  Neck: Normal range of motion.  Cardiovascular:       Intact distal pulses  Pulmonary/Chest: Effort normal.  Musculoskeletal: Normal range of motion.       Normal pain free ROM of lower extremities. Lumbar pain w ROM, slightly relieved by fwd flexion. ttp along left tibia, lumbar spine and left trochanteric region.   Neurological: She is alert and oriented to person, place, and time.       Strength 5/5 bilaterally except left great toe dorsiflexion (chronic per pt). Intact sensation. Normal patellar and ankle reflexes  Skin: Skin is warm and dry. No rash noted. She is not diaphoretic.  Psychiatric: She has a normal mood and affect. Her behavior is normal.    ED Course  Procedures (including critical care time)  Labs Reviewed - No data to display Dg Tibia/fibula Left  04/07/2012  *RADIOLOGY REPORT*  Clinical Data: Leg weakness.  LEFT TIBIA AND FIBULA - 2 VIEW  Comparison: None.  Findings: Left tibia and fibula appear within normal limits.  Soft tissues normal.  Mild atherosclerosis in the popliteal artery. Mild medial compartment of the knee osteoarthritis.  IMPRESSION: No acute osseous abnormality.   Original Report Authenticated By: Andreas Newport, M.D.      No diagnosis found.    MDM  Acute on chronic back pain, LLE pain  Patient with chronic back pain unrelieved by home pain meds.  No new weakness, sensory  deficit or numbness.  Patient can walk but states is painful.  No loss of bowel or bladder control.  No concern for cauda equina.  No fever, night sweats, weight loss, h/o cancer, IVDU.  RICE protocol and pain medicine indicated and discussed with patient.  Jaci Carrel, [place] 04/07/12 1134

## 2012-04-07 NOTE — ED Notes (Addendum)
PT has 2 slipped discs; lumbar stenosis hx and was told to come in if she has dragging of a limb. Pt presents with left leg weakness. Pain in lower back; denies incontinence. Pt seen here before for similar symptoms.

## 2012-04-07 NOTE — ED Notes (Signed)
PT ambulated with baseline gait; VSS; A&Ox3; no signs of distress; respirations even and unlabored; skin warm and dry; no questions upon discharge.  

## 2012-04-10 NOTE — ED Provider Notes (Signed)
Medical screening examination/treatment/procedure(s) were performed by non-physician practitioner and as supervising physician I was immediately available for consultation/collaboration.   Hurman Horn, MD 04/10/12 [phone]

## 2012-04-22 ENCOUNTER — Encounter: Payer: Self-pay | Admitting: Home Health Services

## 2012-04-25 ENCOUNTER — Ambulatory Visit (INDEPENDENT_AMBULATORY_CARE_PROVIDER_SITE_OTHER): Payer: Self-pay | Admitting: Family Medicine

## 2012-04-25 ENCOUNTER — Encounter: Payer: Self-pay | Admitting: Family Medicine

## 2012-04-25 VITALS — BP 134/78 | HR 88 | Ht 63.0 in | Wt 160.1 lb

## 2012-04-25 DIAGNOSIS — M25552 Pain in left hip: Secondary | ICD-10-CM

## 2012-04-25 DIAGNOSIS — M25559 Pain in unspecified hip: Secondary | ICD-10-CM

## 2012-04-25 DIAGNOSIS — M5430 Sciatica, unspecified side: Secondary | ICD-10-CM

## 2012-04-25 MED ORDER — GABAPENTIN 800 MG PO TABS: 800.0000 mg | ORAL_TABLET | Freq: Three times a day (TID) | ORAL | Status: DC

## 2012-04-25 MED ORDER — CYCLOBENZAPRINE HCL 10 MG PO TABS: 10.0000 mg | ORAL_TABLET | Freq: Two times a day (BID) | ORAL | Status: DC | PRN

## 2012-04-25 MED ORDER — MORPHINE SULFATE 15 MG PO TABS: 15.0000 mg | ORAL_TABLET | Freq: Three times a day (TID) | ORAL | Status: DC | PRN

## 2012-04-25 MED ORDER — GABAPENTIN 400 MG PO CAPS: 400.0000 mg | ORAL_CAPSULE | Freq: Three times a day (TID) | ORAL | Status: DC

## 2012-04-25 NOTE — Patient Instructions (Addendum)
Piriformis Syndrome with Rehab Piriformis syndrome is a condition the affects the nervous system in the area of the hip, and is characterized by pain and possibly a loss of feeling in the backside (posterior) thigh that may extend down the entire length of the leg. The symptoms are caused by an increase in pressure on the sciatic nerve by the piriformis muscle, which is on the back of the hip and is responsible for externally rotating the hip. The sciatic nerve and its branches connect to much of the leg. Normally the sciatic nerve runs between the piriformis muscle and other muscles. However, in certain individuals the nerve runs through the muscle, which causes an increase in pressure on the nerve and results in the symptoms of piriformis syndrome. SYMPTOMS   Pain, tingling, numbness, or burning in the back of the thigh that may also extend down the entire leg.  Occasionally, tenderness in the buttock.  Loss of function of the leg.  Pain that worsens when using the piriformis muscle (running, jumping, or stairs).  Pain that increases with prolonged sitting.  Pain that is lessened by laying flat on the back. CAUSES   Piriformis syndrome is the result of an increase in pressure placed on the sciatic nerve. Often times piriformis syndrome is an overuse injury.  Stress placed on the nerve from a sudden increase in the intensity, frequency, or duration of training.  Compensation of other extremity injuries. RISK INCREASES WITH:  Sports that involve the piriformis muscle (running, walking or jumping).  You are born with (congenital) a defect in which the sciatic nerve passes through the muscle. PREVENTION  Warm up and stretch properly before activity.  Allow for adequate recovery between workouts.  Maintain physical fitness:  Strength, flexibility, and endurance.  Cardiovascular fitness. PROGNOSIS  If treated properly, then the symptoms of piriformis syndrome usually resolve in 2  to 6 weeks. RELATED COMPLICATIONS   Persistent and possibly permanent pain and numbness in the lower extremity.  Weakness of the extremity that may progress to disability and inability to compete. TREATMENT  The most effective treatment for piriformis syndrome is rest from any activities that aggravate the symptoms. Ice and pain medication may help reduce pain and inflammation. The use of strengthening and stretching exercises may help reduce pain with activity. These exercises may be performed at home or with a therapist. A referral to a therapist may be given for further evaluation and treatment, such as ultrasound. Corticosteroid injections may be given to reduce inflammation that is causing pressure to be placed on the sciatic nerve. If non-surgical (conservative) treatment is unsuccessful, then surgery may be recommended.  MEDICATION   If pain medication is necessary, then nonsteroidal anti-inflammatory medications, such as aspirin and ibuprofen, or other minor pain relievers, such as acetaminophen, are often recommended.  Do not take pain medication for 7 days before surgery.  Prescription pain relievers may be given if deemed necessary by your caregiver. Use only as directed and only as much as you need.  Corticosteroid injections may be given by your caregiver. These injections should be reserved for the most serious cases, because they may only be given a certain number of times. HEAT AND COLD:   Cold treatment (icing) relieves pain and reduces inflammation. Cold treatment should be applied for 10 to 15 minutes every 2 to 3 hours for inflammation and pain and immediately after any activity that aggravates your symptoms. Use ice packs or massage the area with a piece of ice (ice massage).    Heat treatment may be used prior to performing the stretching and strengthening activities prescribed by your caregiver, physical therapist, or athletic trainer. Use a heat pack or soak the injury in  warm water. SEEK IMMEDIATE MEDICAL CARE IF:  Treatment seems to offer no benefit, or the condition worsens.  Any medications produce adverse side effects. EXERCISES RANGE OF MOTION (ROM) AND STRETCHING EXERCISES - Piriformis Syndrome These exercises may help you when beginning to rehabilitate your injury. Your symptoms may resolve with or without further involvement from your physician, physical therapist or athletic trainer. While completing these exercises, remember:   Restoring tissue flexibility helps normal motion to return to the joints. This allows healthier, less painful movement and activity.  An effective stretch should be held for at least 30 seconds.  A stretch should never be painful. You should only feel a gentle lengthening or release in the stretched tissue. STRETCH - Hip Rotators  Lie on your back on a firm surface. Grasp your right / left knee with your right / left hand and your ankle with your opposite hand.  Keeping your hips and shoulders firmly planted, gently pull your right / left knee and rotate your lower leg toward your opposite shoulder until you feel a stretch in your buttocks.  Hold this stretch for __________ seconds. Repeat this stretch __________ times. Complete this stretch __________ times per day. STRETCH  Iliotibial Band  On the floor or bed, lie on your side so your right / left leg is on top. Bend your knee and grab your ankle.  Slowly bring your knee back so that your thigh is in line with your trunk. Keep your heel at your buttocks and gently arch your back so your head, shoulders and hips line up.  Slowly lower your leg so that your knee approaches the floor/bed until you feel a gentle stretch on the outside of your right / left thigh. If you do not feel a stretch and your knee will not fall farther, place the heel of your opposite foot on top of your knee and pull your thigh down farther.  Hold this stretch for __________ seconds. Repeat  __________ times. Complete __________ times per day. STRENGTHENING EXERCISES - Piriformis Syndrome  These are some of the caregiver again or until your symptoms are resolved. Remember:   Strong muscles with good endurance tolerate stress better.  Do the exercises as initially prescribed by your caregiver. Progress slowly with each exercise, gradually increasing the number of repetitions and weight used under their guidance. STRENGTH - Hip Abductors, Straight Leg Raises Be aware of your form throughout the entire exercise so that you exercise the correct muscles. Sloppy form means that you are not strengthening the correct muscles.  Lie on your side so that your head, shoulders, knee and hip line up. You may bend your lower knee to help maintain your balance. Your right / left leg should be on top.  Roll your hips slightly forward, so that your hips are stacked directly over each other and your right / left knee is facing forward.  Lift your top leg up 4-6 inches, leading with your heel. Be sure that your foot does not drift forward or that your knee does not roll toward the ceiling.  Hold this position for __________ seconds. You should feel the muscles in your outer hip lifting (you may not notice this until your leg begins to tire).  Slowly lower your leg to the starting position. Allow the muscles to   fully relax before beginning the next repetition. Repeat __________ times. Complete this exercise __________ times per day.  STRENGTH - Hip Abductors, Quadriped  On a firm, lightly padded surface, position yourself on your hands and knees. Your hands should be directly below your shoulders and your knees should be directly below your hips.  Keeping your right / left knee bent, lift your leg out to the side. Keep your legs level and in line with your shoulders.  Position yourself on your hands and knees.  Hold for __________ seconds.  Keeping your trunk steady and your hips level, slowly  lower your leg to the starting position. Repeat __________ times. Complete this exercise __________ times per day.  STRENGTH - Hip Abductors, Standing  Tie one end of a rubber exercise band/tubing to a secure surface (table, pole) and tie a loop at the other end.  Place the loop around your right / left ankle. Keeping your ankle with the band directly opposite of the secured end, step away until there is tension in the tube/band.  Hold onto a chair as needed for balance.  Keeping your back upright, your shoulders over your hips, and your toes pointing forward, lift your right / left leg out to your side. Be sure to lift your leg with your hip muscles. Do not "throw" your leg or tip your body to lift your leg.  Slowly and with control, return to the starting position. Repeat exercise __________ times. Complete this exercise __________ times per day.  Document Released: 05/08/2005 Document Revised: 11/07/2011 Document Reviewed: 08/20/2008 [employer] Patient Information 2013 [employer].  

## 2012-04-25 NOTE — Assessment & Plan Note (Addendum)
Discussed possibility of steroid shot for possible trochanteric bursitis with Dr. Gwendolyn Grant. Since patient has ongoing back pain which could be a contributing factor, will refer to sports medicine to evaluate whether hip injection is best modality for treatment. Patient would like to pursue this further after the new year.  Given stiffness with hip and leg movement, provided stretching exercises for piriformis syndrome.

## 2012-04-25 NOTE — Progress Notes (Signed)
Patient ID: BRYCE KIMBLE    DOB: 12-09-1950, 61 y.o.   MRN: [id number] --- Subjective:  Raven [name] is a 61 y.o.female with h/o L5-S1 annular tear in 2012 and spinal stenosis who presents for medication refill and follow up.  - back pain: was seen in the ED for acute exacerbation of back pain and left lower leg on 04/07/12. She went to the ED because she was concerned that she was having left lower leg weakness. She was given an IM dose of dilaudid 1mg  which helped with the pain. Weakness resolved with pain control. Since then, lower back pain has been stable, 6-7/10. She was able to cook at Thanksgiving but reports that after standing for a long period of time, her lower back and left side of leg pain were worst. Back pain is worst with straightening up, better with bending forward. No new leg weakness. No urine or bowel incontinence.    ROS: see HPI Past Medical History: reviewed and updated medications and allergies. Social History: Tobacco: current smoker  Objective: Filed Vitals:   04/25/12 0829  BP: 134/78  Pulse: 88    Physical Examination:   General appearance - alert, well appearing, and in no distress Chest - clear to auscultation, no wheezes, rales or rhonchi, symmetric air entry Heart - normal rate, regular rhythm, normal S1, S2, no murmurs, rubs, clicks or gallops  MSK - 4+/5 strength with knee flexion and extension bilaterally, 4+/5 strength with plantarflexion and dorsiflexion bilaterally Internal and external rotation of left and right hip normal.  Stiffness with hip flexion, adduction and internal rotation bilaterally although L>R.  Tenderness to palpation along left paralumbar muscle

## 2012-04-25 NOTE — Assessment & Plan Note (Addendum)
S/p flare up of pain for which she was seen in the ED. Currently back at her baseline pain control. Refilled gabapentin 1200mg  tid, morphine 15mg  tid and flexeril 10mg  daily.

## 2012-05-20 ENCOUNTER — Telehealth: Payer: Self-pay | Admitting: Family Medicine

## 2012-05-20 NOTE — Telephone Encounter (Signed)
FMLA papers to be completed by [place].

## 2012-05-24 NOTE — Telephone Encounter (Signed)
Clinical info completed.  Will place FMLA form in Dr. Whitney Muse box for additional MD info and signature.  Gaylene Brooks, RN

## 2012-05-27 NOTE — Telephone Encounter (Signed)
Ms. Tartt notified that FMLA forms are completed and ready to be picked up at front desk.  Ileana Ladd

## 2012-06-03 ENCOUNTER — Encounter: Payer: Self-pay | Admitting: Family Medicine

## 2012-06-03 ENCOUNTER — Ambulatory Visit (INDEPENDENT_AMBULATORY_CARE_PROVIDER_SITE_OTHER): Payer: Self-pay | Admitting: Family Medicine

## 2012-06-03 VITALS — BP 130/77 | HR 98 | Ht 63.0 in | Wt 159.6 lb

## 2012-06-03 DIAGNOSIS — M5430 Sciatica, unspecified side: Secondary | ICD-10-CM

## 2012-06-03 MED ORDER — GABAPENTIN 800 MG PO TABS: 800.0000 mg | ORAL_TABLET | Freq: Three times a day (TID) | ORAL | Status: DC

## 2012-06-03 MED ORDER — CYCLOBENZAPRINE HCL 10 MG PO TABS: 10.0000 mg | ORAL_TABLET | Freq: Two times a day (BID) | ORAL | Status: DC | PRN

## 2012-06-03 MED ORDER — NAPROXEN 500 MG PO TABS: 500.0000 mg | ORAL_TABLET | Freq: Two times a day (BID) | ORAL | Status: DC

## 2012-06-03 MED ORDER — GABAPENTIN 400 MG PO CAPS: 400.0000 mg | ORAL_CAPSULE | Freq: Three times a day (TID) | ORAL | Status: DC

## 2012-06-03 MED ORDER — MORPHINE SULFATE 15 MG PO TABS: 15.0000 mg | ORAL_TABLET | Freq: Three times a day (TID) | ORAL | Status: DC | PRN

## 2012-06-03 NOTE — Assessment & Plan Note (Signed)
Flare up of pain over the weekend with muscle spasms in lower back. Refill flexeril 10mg  bud/prn, morphine 15mg  tid and naproxen bid.  Referred patient to sports medicine for possible hip injection.

## 2012-06-03 NOTE — Patient Instructions (Signed)
You can make an appointment with sports medicine.

## 2012-06-03 NOTE — Progress Notes (Signed)
Patient ID: Raven [name]    DOB: [birth year], 62 y.o.   MRN: [id number] --- Subjective:  Raven [name] is a 62 y.o.female who presents for follow up on back pain and left leg pain.  - worsening pain over the weekend. Ran out of flexeril last week and has been having more pain since. She has been taking morphine 15mg  three times daily as scheduled and naproxen. Pain is located in lower back and loops around to the left lateral aspect of the leg. Feels like a strain with pulling. Was not able to sleep because of the pain. She is still able to walk around the house but standing for more than 5 minutes at a time is difficult. No new weakness in lower extremities. Pain this morning is an 8/10. She did not take pain medicine this am.    ROS: see HPI Past Medical History: reviewed and updated medications and allergies. Social History: Tobacco: current smoker.   Objective: Filed Vitals:   06/03/12 0831  BP: 130/77  Pulse: 98    Physical Examination:   General appearance - alert, well appearing, and in no distress Back - tenderness along left and right lumbar paraspinal muscles with muscles spasm and tightness present.  4+/5 strength with knee flexion and extension bilaterally, 4+/5 strength with hip flexion b/l.  Normal sensation to light touch.

## 2012-07-02 ENCOUNTER — Ambulatory Visit (INDEPENDENT_AMBULATORY_CARE_PROVIDER_SITE_OTHER): Payer: Medicaid Other | Admitting: Family Medicine

## 2012-07-02 ENCOUNTER — Encounter: Payer: Self-pay | Admitting: Family Medicine

## 2012-07-02 VITALS — BP 131/80 | HR 97 | Ht 63.0 in | Wt 161.0 lb

## 2012-07-02 DIAGNOSIS — M5430 Sciatica, unspecified side: Secondary | ICD-10-CM

## 2012-07-02 DIAGNOSIS — J45909 Unspecified asthma, uncomplicated: Secondary | ICD-10-CM

## 2012-07-02 DIAGNOSIS — Z23 Encounter for immunization: Secondary | ICD-10-CM

## 2012-07-02 DIAGNOSIS — M25559 Pain in unspecified hip: Secondary | ICD-10-CM

## 2012-07-02 DIAGNOSIS — I10 Essential (primary) hypertension: Secondary | ICD-10-CM

## 2012-07-02 DIAGNOSIS — M25552 Pain in left hip: Secondary | ICD-10-CM

## 2012-07-02 MED ORDER — NAPROXEN 500 MG PO TABS: 500.0000 mg | ORAL_TABLET | Freq: Two times a day (BID) | ORAL | Status: DC

## 2012-07-02 MED ORDER — GABAPENTIN 800 MG PO TABS: 800.0000 mg | ORAL_TABLET | Freq: Three times a day (TID) | ORAL | Status: DC

## 2012-07-02 MED ORDER — KETOROLAC TROMETHAMINE 30 MG/ML IM SOLN: 30.0000 mg | Freq: Once | INTRAMUSCULAR | Status: DC

## 2012-07-02 MED ORDER — GABAPENTIN 400 MG PO CAPS: 400.0000 mg | ORAL_CAPSULE | Freq: Three times a day (TID) | ORAL | Status: DC

## 2012-07-02 MED ORDER — MORPHINE SULFATE ER 30 MG PO TBCR: 30.0000 mg | EXTENDED_RELEASE_TABLET | Freq: Two times a day (BID) | ORAL | Status: DC

## 2012-07-02 MED ORDER — ALBUTEROL SULFATE HFA 108 (90 BASE) MCG/ACT IN AERS: 1.0000 | INHALATION_SPRAY | RESPIRATORY_TRACT | Status: DC | PRN

## 2012-07-02 MED ORDER — RANITIDINE HCL 150 MG PO TABS: 150.0000 mg | ORAL_TABLET | Freq: Every day | ORAL | Status: DC

## 2012-07-02 MED ORDER — CYCLOBENZAPRINE HCL 10 MG PO TABS: 10.0000 mg | ORAL_TABLET | Freq: Three times a day (TID) | ORAL | Status: DC | PRN

## 2012-07-02 MED ORDER — NYSTATIN 100000 UNIT/GM EX POWD: Freq: Four times a day (QID) | CUTANEOUS | Status: DC

## 2012-07-02 MED ORDER — KETOROLAC TROMETHAMINE 60 MG/2ML IM SOLN
60.0000 mg | Freq: Once | INTRAMUSCULAR | Status: AC
  Administered 2012-07-02: 60 mg via INTRAMUSCULAR

## 2012-07-02 MED ORDER — LISINOPRIL-HYDROCHLOROTHIAZIDE 20-25 MG PO TABS: 0.5000 | ORAL_TABLET | Freq: Every day | ORAL | Status: DC

## 2012-07-02 NOTE — Assessment & Plan Note (Signed)
Appears to be a component of trochanteric bursitis. With other musculoskeletal co-morbidities, would like patient to be evaluated by sports medicine for possible steroid injection.

## 2012-07-02 NOTE — Patient Instructions (Signed)
I recommend an appointment with sports medicine as soon as possible for an injection.   Follow up in 1 month.

## 2012-07-02 NOTE — Progress Notes (Signed)
Patient ID: Raven [name]    DOB: 1950/12/15, 62 y.o.   MRN: [id number] --- Subjective:  Raven [name] is a 63 y.o.female who presents for follow up on back pain and left leg pain.  - pain is 9.5/10 in lower back on left and lateral aspect of left upper leg and thigh. Worst in the last 3 days. Worst with standing and walking. Pain medicine providing some relief but not as much as usual. No new weakness. No urine or bowel incontinence.   ROS: see HPI Past Medical History: reviewed and updated medications and allergies. Social History: Tobacco: current smoker.   Objective: Filed Vitals:   07/02/12 1338  BP: 131/80  Pulse: 97    Physical Examination:   General appearance - alert, well appearing, and some distress from pain CV: S1S2, RRR, no murmur Pulm: CTA bilaterally, no wheezing.  Back - tenderness along left and right lumbar paraspinal muscles with muscles spasm and tightness present.  4/5 strength with knee flexion on left compared to 5/5 on right and 4/5 strength with dorsiflexion on left compared to 5/5on right secondary to pain. Tenderness to palpation along greater trochanter on left.

## 2012-07-02 NOTE — Assessment & Plan Note (Signed)
BP controled in office. Refill lisinopril/hctz 20/25 1/2 tab

## 2012-07-02 NOTE — Assessment & Plan Note (Addendum)
Worsening pain. Will switch patient from morphine 15mg  tid to MS contin 30mg  bid. She has been on this in the past with good results. WIll follow up pain control. Also refilled gabapentin, naproxen and flexeril which do help. Patient received toradol 60mg  IM shot in office. Reviewed red flags for return. No red flags during visit such as new onset weakness, incontinence or loss of sensation

## 2012-07-02 NOTE — Assessment & Plan Note (Signed)
No respiratory distress or wheezing on exam. Refilled abuterol and patient received flu shot.

## 2012-07-09 ENCOUNTER — Telehealth: Payer: Self-pay | Admitting: *Deleted

## 2012-07-09 NOTE — Telephone Encounter (Signed)
PA required for Morphine  Sulfate ER 30 mg tabs. Form placed in MD box.

## 2012-07-11 NOTE — Telephone Encounter (Signed)
PA approved from Medicaid . Pharmacy notified.

## 2012-07-31 ENCOUNTER — Ambulatory Visit: Payer: Medicaid Other | Admitting: Family Medicine

## 2012-08-07 ENCOUNTER — Encounter: Payer: Self-pay | Admitting: Family Medicine

## 2012-08-07 ENCOUNTER — Ambulatory Visit (INDEPENDENT_AMBULATORY_CARE_PROVIDER_SITE_OTHER): Payer: Self-pay | Admitting: Family Medicine

## 2012-08-07 VITALS — BP 120/68 | Temp 98.5°F | Ht 63.0 in | Wt 163.0 lb

## 2012-08-07 DIAGNOSIS — M25519 Pain in unspecified shoulder: Secondary | ICD-10-CM

## 2012-08-07 DIAGNOSIS — M5430 Sciatica, unspecified side: Secondary | ICD-10-CM

## 2012-08-07 DIAGNOSIS — E785 Hyperlipidemia, unspecified: Secondary | ICD-10-CM

## 2012-08-07 DIAGNOSIS — I10 Essential (primary) hypertension: Secondary | ICD-10-CM

## 2012-08-07 DIAGNOSIS — M25512 Pain in left shoulder: Secondary | ICD-10-CM

## 2012-08-07 DIAGNOSIS — M5432 Sciatica, left side: Secondary | ICD-10-CM

## 2012-08-07 MED ORDER — MORPHINE SULFATE 15 MG PO TABS: 15.0000 mg | ORAL_TABLET | Freq: Three times a day (TID) | ORAL | Status: DC | PRN

## 2012-08-07 MED ORDER — CYCLOBENZAPRINE HCL 10 MG PO TABS: 10.0000 mg | ORAL_TABLET | Freq: Three times a day (TID) | ORAL | Status: AC | PRN

## 2012-08-07 NOTE — Assessment & Plan Note (Addendum)
Will switch back from ms contin 30mg  bid to morphine 15mg  tid since no big difference in symptom management. Continue gabapentin, naproxen and flexeril. Refilled flexeril 90tabs 10mg  no refills and morphine 15mg  tid 90 tabs, no refills.  Patient to have medicare at next visit. Will start PT at that time. Patient also to make appointment with sports medicine for evaluation for hip injection for possible left trochanteric bursitis.  Reviewed red flags for return (weakness, incontinence)

## 2012-08-07 NOTE — Patient Instructions (Signed)
Make appointment with sports medicine for the left hip pain.   See you back in 1 month.

## 2012-08-07 NOTE — Assessment & Plan Note (Signed)
Plan on re-drawing lipid panel next month and starting statin therapy if not at goal. Will discuss with patient at next visit.

## 2012-08-07 NOTE — Assessment & Plan Note (Signed)
Likely component of rotator cuff strain. Gave exercises to do at home.

## 2012-08-07 NOTE — Assessment & Plan Note (Addendum)
BP controled on lisinopril/hctz 10/12.5. Last BMP in sep 2013 and normal.  Repeat BMP and lipid panel in September. Continue current meds.

## 2012-08-07 NOTE — Progress Notes (Signed)
Patient ID: Raven [name]    DOB: 1950-09-17, 62 y.o.   MRN: [id number] --- Subjective:  Raven [name] is a 62 y.o.female with h/o HTN and left sciatica who presents for follow up on back pain and hip pain.  - had flare up of pain this Monday and Tuesday. Pain located on right side of lower back. Also had some right hand clumsiness. No weakness in lower extremities. No urine or bowel incontinence.  Pain rated at greater than 10. No injury to area.  Today, pain has significantly subsided and listed at 5. No pain on right side anymore, mostly located in left foot with throbbing feeling, which is chronic for her.  Has been taking MS contin 30mg  bid which was changed last visit. She doesn't notice any big difference in pain control from when she was using morphine 15mg  tid.  Also reports some left shoulder pain, sharp that radiates from top of shoulder to lower part of shoulder. No weakness in left hand or arm.    ROS: see HPI Past Medical History: reviewed and updated medications and allergies. Social History: Tobacco: 1/2 pack per day  Objective: Filed Vitals:   08/07/12 0837  BP: 140/66  Temp: 98.5 F (36.9 C)   Repeat BP: 120/68 Physical Examination:   General appearance - alert, well appearing, and in no distress Chest - clear to auscultation, no wheezes, rales or rhonchi, symmetric air entry Heart - normal rate, regular rhythm, normal S1, S2, no murmurs MSK - no paraspinal lumbar tenderness, no lumbar spine tenderness, 4+/5 strength in lower extremities bilaterally, 5/5 strength in upper extremities bilaterally, 5/5 strength of individual digits.  Shoulder: positive left Appley scratch test, normal range of motion of shoulders, normal empty can b/l.

## 2012-08-15 ENCOUNTER — Telehealth: Payer: Self-pay | Admitting: Family Medicine

## 2012-08-15 NOTE — Telephone Encounter (Signed)
Patient called back to let Dr. Gwenlyn Saran know that the Rx's all need to be for 90 day supply.

## 2012-08-15 NOTE — Telephone Encounter (Signed)
Patient is requesting for her insurance, a letterhead with Dr. Gwenlyn Saran name, all of her meds that are listed with her name, dob, address listed as well for mail order services.  Her [employer] Member ID # is [id number].  This can be faxed to [phone].  She said to let Dr. Gwenlyn Saran know that they won't fill the Muscle Relaxer or Morphine.

## 2012-08-16 NOTE — Telephone Encounter (Signed)
Patient just needs a letter faxed to [facility] with a list of her medications on it, she does not need any refills at this time. I will fax the letter to the number provided.Berl Bonfanti, Rodena Medin

## 2012-08-29 ENCOUNTER — Ambulatory Visit: Payer: Self-pay | Admitting: Family Medicine

## 2012-09-03 ENCOUNTER — Ambulatory Visit (INDEPENDENT_AMBULATORY_CARE_PROVIDER_SITE_OTHER): Payer: Medicare HMO | Admitting: Family Medicine

## 2012-09-03 ENCOUNTER — Encounter: Payer: Self-pay | Admitting: Family Medicine

## 2012-09-03 VITALS — BP 109/70 | HR 94 | Ht 63.0 in | Wt 163.5 lb

## 2012-09-03 DIAGNOSIS — M5430 Sciatica, unspecified side: Secondary | ICD-10-CM

## 2012-09-03 DIAGNOSIS — J45909 Unspecified asthma, uncomplicated: Secondary | ICD-10-CM

## 2012-09-03 DIAGNOSIS — M25559 Pain in unspecified hip: Secondary | ICD-10-CM

## 2012-09-03 DIAGNOSIS — M5432 Sciatica, left side: Secondary | ICD-10-CM

## 2012-09-03 DIAGNOSIS — M25552 Pain in left hip: Secondary | ICD-10-CM

## 2012-09-03 DIAGNOSIS — R52 Pain, unspecified: Secondary | ICD-10-CM

## 2012-09-03 MED ORDER — KETOROLAC TROMETHAMINE 30 MG/ML IJ SOLN
30.0000 mg | Freq: Once | INTRAMUSCULAR | Status: AC
  Administered 2012-09-03: 30 mg via INTRAMUSCULAR

## 2012-09-03 MED ORDER — FLUTICASONE PROPIONATE HFA 110 MCG/ACT IN AERO: 1.0000 | INHALATION_SPRAY | Freq: Two times a day (BID) | RESPIRATORY_TRACT | Status: DC

## 2012-09-03 MED ORDER — MORPHINE SULFATE 15 MG PO TABS: 15.0000 mg | ORAL_TABLET | Freq: Three times a day (TID) | ORAL | Status: DC | PRN

## 2012-09-03 MED ORDER — PREDNISONE 20 MG PO TABS: 60.0000 mg | ORAL_TABLET | Freq: Every day | ORAL | Status: DC

## 2012-09-03 MED ORDER — NAPROXEN 500 MG PO TABS: 500.0000 mg | ORAL_TABLET | Freq: Two times a day (BID) | ORAL | Status: DC

## 2012-09-03 NOTE — Progress Notes (Signed)
Patient ID: Raven [name]    DOB: 06-12-1950, 62 y.o.   MRN: [id number] --- Subjective:  Raven [name] is a 62 y.o.female with h/o HTN and left sciatica who presents for follow up on pain of left lower back and lateral thigh. Pain has flared up on lateral aspect of left thigh since Sunday. 9/10. Worst with laying on hip. Lower back pain worst with standing for a longer period of time. Feeling of heaviness in left leg unchanged from previous. She denies any new onset lower extremity weakness. No urine or bowel incontinence. Takes the MSIR 15mg  twice a day most times and occasionally three times a day. Also takes gabapentin 1200mg  tid which she tolerates well. Takes naproxen and flexeril as well.   - Asthma: recent flare up of asthma. Using albuterol 3 times daily. Has occasional non productive cough. Reports dyspnea with walking 100 yards. No fevers, no chills. Hears herself wheezing.     ROS: see HPI Past Medical History: reviewed and updated medications and allergies. Social History: Tobacco: 1/2 pack per day  Objective: Filed Vitals:   09/03/12 0837  BP: 109/70  Pulse: 94   O2 sat: 98% on room air.   Physical Examination:   General appearance - alert, well appearing, and in mild distress from pain Chest - scattered wheezing, decreased breath sounds bilaterally Heart - normal rate, regular rhythm, normal S1, S2, no murmurs MSK - no tenderness along lumbar spine and paraspinal muscles, 4/5 strength in extremities bilaterally, Tenderness to palpation along left greater trochanter.    MRI: 11/12/2010 MRI LUMBAR SPINE WITHOUT CONTRAST   Findings: Left hepatic lobe large simple cyst re-identified. Right  hepatic lobe T2 hyperintense focus is stable and was found to be  probable benign hemangioma on the comparison ultrasound as well.  Other Visualized abdominal viscera and paraspinal soft tissues are  within normal limits.  Stable vertebral height and alignment, stable grade 1   anterolisthesis of L5 on S1. No marrow edema or evidence of acute  osseous abnormality.  Visualized lower thoracic spinal cord is normal with conus  medularis at L1-L2.  T10-T11: Stable mild circumferential disc bulge, no significant  stenosis.  T11-T12: Stable moderate circumferential disc osteophyte complex  with mild to moderate facet and ligament flavum hypertrophy.  Stable mild spinal stenosis without mass effect on the lower  thoracic spinal cord (AP thecal sac 8 mm). Stable moderate  bilateral T11 foraminal stenosis.  T12-L1: Negative disc. Moderate right and mild left facet  hypertrophy is stable, no stenosis.  L1-L2: Negative disc. Stable moderate facet hypertrophy. No  stenosis.  L2-L3: Stable mild circumferential disc bulge. Stable severe  bilateral facet hypertrophy. Stable mild left L2 foraminal  stenosis.  L3-L4: Stable mild right eccentric circumferential disc bulge.  Stable moderate right greater than left facet hypertrophy. Stable  borderline spinal stenosis, and mild bilateral L3 foraminal  stenosis.  L4-L5: Moderate circumferential disc bulge is stable. Moderate to  severe facet and ligament flavum hypertrophy is stable. No  significant spinal stenosis. Moderate left and mild right L4  foraminal stenosis is stable.  L5-S1: Chronic very severe facet hypertrophy, worse on the left.  Moderate to severe ligament flavum hypertrophy. Moderate  circumferential disc and pseudo disc protrusion. Mild to moderate  spinal stenosis, bilateral lateral recess stenosis, and moderate  bilateral L5 foraminal stenosis appears stable. A left far lateral  annular tear is new / increased and is in proximity to the exiting  left L5 nerve (series 6 image 28).  S1-S2: Vestigial disc, otherwise negative.  IMPRESSION:  1. New / increased annular tear of the left L5-S1 disc could be a  source for increased left L5 radiculitis.  2. Otherwise no interval change in diffuse lumbar disc and  facet  degeneration as detailed above. No high-grade spinal stenosis.  3. Stable benign liver lesions.

## 2012-09-03 NOTE — Assessment & Plan Note (Addendum)
Stable. On chronic medication for pain: refilled MS IR 15mg  tid/prn 90 tabs, no refills. Since I will be out of clinic for the next month, I prescribed a prescription with a do not fill date of 10/03/12.  Patient also on gabapentin and flexeril.

## 2012-09-03 NOTE — Patient Instructions (Addendum)
Please make an appointment with sports medicine.   The name of the doctor on my team is Dr. Despina Hick.

## 2012-09-03 NOTE — Assessment & Plan Note (Signed)
Uncontrolled with exacerbation. WIll start patient on controler fluticasone inhaler and 5 day steroid burst.

## 2012-09-03 NOTE — Assessment & Plan Note (Signed)
Likely from greater trochanteric bursitis. Given more complex lower back pathology, would like patient to be evaluated by sports medicine for possible joint injection.  Toradol 30mg  IM in office today.

## 2012-09-12 ENCOUNTER — Telehealth: Payer: Self-pay | Admitting: Family Medicine

## 2012-09-12 NOTE — Telephone Encounter (Signed)
[employer] is needing the list of medications that the patient is taking.  This for her to receive her medication through mail order.  It needs to be called to [phone].  The Morphine and muscle relaxers can be omitted because the do not pay for those.  She said that her account number may also may be needed and is [phone].  This needs to be done as soon as possible because this will take 15 days and she is getting low on her medications.

## 2012-09-12 NOTE — Telephone Encounter (Signed)
Called patient, she is requesting that a list of all her current medications be sent to [facility] at [phone]. Medication list was faxed.Busick, Rodena Medin

## 2012-09-17 ENCOUNTER — Telehealth: Payer: Self-pay | Admitting: *Deleted

## 2012-09-17 ENCOUNTER — Telehealth: Payer: Self-pay | Admitting: Family Medicine

## 2012-09-17 ENCOUNTER — Other Ambulatory Visit: Payer: Self-pay | Admitting: Family Medicine

## 2012-09-17 DIAGNOSIS — R234 Changes in skin texture: Secondary | ICD-10-CM

## 2012-09-17 DIAGNOSIS — N6310 Unspecified lump in the right breast, unspecified quadrant: Secondary | ICD-10-CM

## 2012-09-17 NOTE — Telephone Encounter (Signed)
Called patient to let her know that I sent in the medications to the [facility] mail order pharmacy.  I also was checking in about an area on her right breast that she showed me at the end of her visit last office visit with me. The area at the time was erythematous with a non fluctuant 1cm underlying nodule. It wasn't warm or tender to touch at the time. Patient stated at the time that it had suddenly popped up 3 days prior to visit and she thought she had been bitten by an insect.  Today, she states that area is no longer red and she cannot feel any underlying firmness. I would like her to get an ultrasound to take an initial look to assess whether this is an abscess or a more solid mass. Will follow recommendations of ultrasonographer as to whether diagnostic mammogram is warranted. Patient agreed to this.   Marena Chancy, PGY-2 Family Medicine Resident

## 2012-09-17 NOTE — Telephone Encounter (Signed)
Called pt. Left message to call back. Please tell pt: APPT AT [facility] Thursday MAY 8TH AT 3:10 PM FOR BREAST US AND DIAGNOSTIC MAMMOGRAM. .Arlyss Repress

## 2012-09-17 NOTE — Telephone Encounter (Signed)
Pt is calling back to say that she is needing refills on all her meds and to please make sure her name/dob/[employer] number/address is all on the order. ID# [id number]  Can be called into [phone]

## 2012-09-17 NOTE — Telephone Encounter (Signed)
Called [employer] [facility] at [phone]  and called the following medications:   Albuterol HFA 1-2 puffs every 4hrs/prn 1 inhaler, 1 refill Flovent 1 puff twice daily 1 inhaler 3 refills Gabapentin 400mg  tid 270 3 refills Gabapentin 800mg  tid 270 tabs 3 refills Ibuprofen 800mg  q8/prn pain 30 tablets 0 refills Lisinopril/hctz 20/25 1/2 tab, 45 tablets, 3 refills Naproxen 500mg  bid 60 tablets 3 refills Nystatin powder apply 4 times daily to affected area 60g 0 refills Ranitidine 150mg  bid 60 tablets 3 refills.   Marena Chancy, PGY-2 Family Medicine Resident

## 2012-09-17 NOTE — Telephone Encounter (Signed)
pls let patient know this has been taken care of

## 2012-09-17 NOTE — Telephone Encounter (Signed)
Spoke with radiology technician who recommended diagnostic mammogram along with [place] for evaluation of breast mass.   Marena Chancy, PGY-2 Family Medicine Resident

## 2012-09-18 NOTE — Telephone Encounter (Signed)
Pt advised of mammogram appt and pt is given # of the [facility] in case she needs to reschedule. Pt requests refill on cyclobenzaprine 10mg  tablets #90, takes 1 tab TID prn.. Did not see in med list  but she says you Rx it for her for muscle spasms. Pharm [facility].

## 2012-09-19 MED ORDER — CYCLOBENZAPRINE HCL 10 MG PO TABS: 10.0000 mg | ORAL_TABLET | Freq: Three times a day (TID) | ORAL | Status: DC | PRN

## 2012-09-19 NOTE — Telephone Encounter (Signed)
E-prescribed flexeril 10mg  tid/prn muscle spasms 90 tablets 1 refill. Patient has been on this medication, I am not sure why the medication was not on her list.   Marena Chancy, PGY-2 Family Medicine Resident

## 2012-09-26 ENCOUNTER — Other Ambulatory Visit: Payer: Medicare HMO

## 2012-09-30 ENCOUNTER — Other Ambulatory Visit: Payer: Medicare HMO

## 2012-10-07 ENCOUNTER — Ambulatory Visit (INDEPENDENT_AMBULATORY_CARE_PROVIDER_SITE_OTHER): Payer: Medicare HMO | Admitting: Emergency Medicine

## 2012-10-07 ENCOUNTER — Encounter: Payer: Self-pay | Admitting: Emergency Medicine

## 2012-10-07 VITALS — BP 140/70 | HR 106 | Ht 63.0 in | Wt 163.0 lb

## 2012-10-07 DIAGNOSIS — F17200 Nicotine dependence, unspecified, uncomplicated: Secondary | ICD-10-CM

## 2012-10-07 DIAGNOSIS — I10 Essential (primary) hypertension: Secondary | ICD-10-CM

## 2012-10-07 DIAGNOSIS — Z716 Tobacco abuse counseling: Secondary | ICD-10-CM

## 2012-10-07 DIAGNOSIS — Z7189 Other specified counseling: Secondary | ICD-10-CM

## 2012-10-07 NOTE — Progress Notes (Signed)
  Subjective:    Patient ID: Raven [name], female    DOB: [birth year], 62 y.o.   MRN: [id number]  HPI Raven [name] is here for f/u HTN.  Hypertension Well controlled: no - elevated on arrival.  Patient reports that she took her medicine right before she got here.  Also states that she has been rushing around this morning, which usually makes her pressure go up. Compliant with medication: yes Side effects from medication: no Check BP at home: no  Chest pain: no Palpitations: no Vision changes: no Leg edema: no Dizziness: no  I have reviewed and updated the following as appropriate: allergies and current medications SHx: current smoker - not ready to quit; states that when she's ready she will quit   Review of Systems See HPI    Objective:   Physical Exam BP 140/70  Pulse 106  Ht 5\' 3"  (1.6 m)  Wt 163 lb (73.936 kg)  BMI 28.88 kg/m2 Gen: alert, cooperative, NAD HEENT: AT/[place], sclera white, MMM Neck: supple CV: RRR, no murmurs Pulm: CTAB, no wheezes or rales Ext: no edema      Assessment & Plan:

## 2012-10-07 NOTE — Assessment & Plan Note (Signed)
Discussed again with patient.  Recommended that she think about what would tell her that "now" is a good time to quit.

## 2012-10-07 NOTE — Assessment & Plan Note (Signed)
Mildly elevated today, repeat okay.  On review of chart, numbers look good.  Will continue lisinopril-HCTZ 20-25mg  1/2 tablet daily.  Follow up in 1 month as scheduled.

## 2012-10-07 NOTE — Patient Instructions (Addendum)
It was nice to see you! Keep taking your blood pressure medicine 1/2 tablet once a day. Follow up next month as scheduled.

## 2012-10-11 ENCOUNTER — Ambulatory Visit
Admission: RE | Admit: 2012-10-11 | Discharge: 2012-10-11 | Disposition: A | Discharge status: Home / Self Care | Payer: Medicare HMO | Source: Ambulatory Visit | Attending: Family Medicine | Admitting: Family Medicine

## 2012-10-11 DIAGNOSIS — R234 Changes in skin texture: Secondary | ICD-10-CM

## 2012-10-11 DIAGNOSIS — N6310 Unspecified lump in the right breast, unspecified quadrant: Secondary | ICD-10-CM

## 2012-10-18 ENCOUNTER — Other Ambulatory Visit: Payer: Self-pay

## 2012-10-18 DIAGNOSIS — Z1231 Encounter for screening mammogram for malignant neoplasm of breast: Secondary | ICD-10-CM

## 2012-10-30 ENCOUNTER — Telehealth: Payer: Self-pay | Admitting: Family Medicine

## 2012-10-30 NOTE — Telephone Encounter (Signed)
Patient is scheduled for 11/01/12 but will not be able to make appt bc her [facility] an [facility]t [facility] the [facility]. Patient states that she HAS to see [facility] by 11/12/12. Wants to know if we could fit her in either Wed 6/18 or Fri 6/20. [facility] is completely booked on both days and I need the ok from her to double-book her a second time in order for patient to be seen next week.

## 2012-10-30 NOTE — Telephone Encounter (Signed)
I looked at schedule and it looks like I still have room on 11/08/12. If not though, ok to double book either 6/18 or 6/20

## 2012-10-30 NOTE — Telephone Encounter (Signed)
I looked at schedule and it looks like I still have room on 11/08/12. If not though, ok to double book either 6/18 or 6/20  

## 2012-10-30 NOTE — Telephone Encounter (Signed)
Will forward to Dr Gwenlyn Saran for review

## 2012-11-01 ENCOUNTER — Ambulatory Visit (INDEPENDENT_AMBULATORY_CARE_PROVIDER_SITE_OTHER): Payer: Medicare HMO | Admitting: Family Medicine

## 2012-11-01 VITALS — BP 150/85 | HR 98 | Temp 97.9°F | Ht 63.0 in | Wt 165.0 lb

## 2012-11-01 DIAGNOSIS — M5432 Sciatica, left side: Secondary | ICD-10-CM

## 2012-11-01 DIAGNOSIS — M25552 Pain in left hip: Secondary | ICD-10-CM

## 2012-11-01 DIAGNOSIS — M5430 Sciatica, unspecified side: Secondary | ICD-10-CM

## 2012-11-01 DIAGNOSIS — I10 Essential (primary) hypertension: Secondary | ICD-10-CM

## 2012-11-01 DIAGNOSIS — M25559 Pain in unspecified hip: Secondary | ICD-10-CM

## 2012-11-01 MED ORDER — KETOROLAC TROMETHAMINE 30 MG/ML IM SOLN: 30.0000 mg | Freq: Once | INTRAMUSCULAR | Status: DC

## 2012-11-01 MED ORDER — CYCLOBENZAPRINE HCL 10 MG PO TABS: 10.0000 mg | ORAL_TABLET | Freq: Three times a day (TID) | ORAL | Status: DC | PRN

## 2012-11-01 MED ORDER — MORPHINE SULFATE 15 MG PO TABS: 15.0000 mg | ORAL_TABLET | Freq: Three times a day (TID) | ORAL | Status: DC | PRN

## 2012-11-01 MED ORDER — KETOROLAC TROMETHAMINE 30 MG/ML IJ SOLN
30.0000 mg | Freq: Once | INTRAMUSCULAR | Status: AC
  Administered 2012-11-01: 30 mg via INTRAMUSCULAR

## 2012-11-01 NOTE — Progress Notes (Signed)
Patient ID: Raven [name]    DOB: 05-26-1950, 62 y.o.   MRN: [id number] --- Subjective:  Raven [name] is a 62 y.o.female who presents with   - hip pain: 9/10. Left lateral thigh and top of foot. Occasional feeling like leg is giving out on her. Is able to go food shopping and uses the cart for stability. Pain better with bending forward on cart.  Pain is worst at night. Sometimes worst with walking.  On Saturday and Sunday, left thigh was hurting to the point of her wanting to go to the ED. Throbbing pain located on lateral aspect of thigh. At that time, no new weakness, no foot drop, no urine or bowel incontinence.   ROS: see HPI Past Medical History: reviewed and updated medications and allergies. Social History: Tobacco: 1/2 pack per day  Objective: Filed Vitals:   11/01/12 0840  BP: 150/85  Pulse: 98  Temp: 97.9 F (36.6 C)    Physical Examination:   General appearance - alert, well appearing, and in no distress Heart - normal rate, regular rhythm, normal S1, S2, no murmurs, rubs, clicks or gallops Abdomen - soft, nontender, nondistended, no masses or organomegaly Back and hip - no tenderness to palpation along lumbar spine or paralumbar muscles bilaterally.  Tenderness along greater trochanter on left.  Limited range of motion with internal and external rotation of both hips L>R. 4+/5 strength with hip flexion bilaterally, 4+/5 strength with knee flexion and extension bilaterally, 4+/5 strength with dorsiflexion and plantarflexion, normal strength with great toe dorsiflexion bilaterally.

## 2012-11-01 NOTE — Patient Instructions (Addendum)
Make an appointment with sports medicine and when you go, wear tennis shoes.   If you use the albuterol more than 3 times a week, start the proair.

## 2012-11-03 NOTE — Assessment & Plan Note (Signed)
See assessment for sciatica

## 2012-11-03 NOTE — Assessment & Plan Note (Signed)
Elevated today 150/85, repeat 140/70. More elevated compared to usual. Likely secondary to pain. Will not increase medication for now. Continue to monitor.

## 2012-11-03 NOTE — Assessment & Plan Note (Addendum)
MRI from 10/2010 showing Mild to moderate spinal stenosis in L2-L3, L3-L4, L4-L5, L5-S1 as well as New / increased annular tear of the left L5-S1 disc. Chronic problem for patient who is not interested in neurosurgery at this time. Continue management with morphine 15mg  tid, naproxen and gabapentin and flexeril for spasms.  Lateral hip pain appears to potentially have component of trochanteric bursitis, but given more complicated picture with lower back pain, I hesitate to give her an empiric hip injection.  Have recommended that she be seen at sports medicine for further evaluation and recommendations and potential hip injection for trochanteric bursitis.   Patient in acute pain in office. Will give toradol injection 30mg  today which has helped in the past.

## 2012-11-29 ENCOUNTER — Encounter: Payer: Self-pay | Admitting: Family Medicine

## 2012-11-29 ENCOUNTER — Ambulatory Visit (INDEPENDENT_AMBULATORY_CARE_PROVIDER_SITE_OTHER): Payer: Medicare HMO | Admitting: Family Medicine

## 2012-11-29 VITALS — BP 129/79 | HR 100 | Ht 63.0 in | Wt 164.9 lb

## 2012-11-29 DIAGNOSIS — M5430 Sciatica, unspecified side: Secondary | ICD-10-CM

## 2012-11-29 DIAGNOSIS — I10 Essential (primary) hypertension: Secondary | ICD-10-CM

## 2012-11-29 DIAGNOSIS — M5432 Sciatica, left side: Secondary | ICD-10-CM

## 2012-11-29 MED ORDER — MORPHINE SULFATE 15 MG PO TABS: 15.0000 mg | ORAL_TABLET | Freq: Three times a day (TID) | ORAL | Status: DC | PRN

## 2012-11-29 MED ORDER — METHOCARBAMOL 500 MG PO TABS: 500.0000 mg | ORAL_TABLET | Freq: Four times a day (QID) | ORAL | Status: DC

## 2012-11-29 NOTE — Assessment & Plan Note (Signed)
Controled. Continue lisinopril/hctz 10/12.5. BMP due in september

## 2012-11-29 NOTE — Progress Notes (Signed)
Patient ID: LEGEND PECORE    DOB: 29-May-1950, 62 y.o.   MRN: [id number] --- Subjective:  Raven [name] is a 62 y.o.female who presents for follow up - back pain: 9/10 today. Worst in lower back. Worst with changing positions especially standing. Better with bending over grocery cart or walker. Better with laying flat. Continues to use walker when walking outside of house. No change in ability to ambulate. Pain stable, but patient is now thinking that she might want to see neurosurgeon.  No new weakness in lower extremities, no numbness or tingling in lower extremities. No urine or bowel incontinence.   - BP taking lisinopril/hctz daily. No chest pain, no shortness of breath, no lower extremity swelling.   ROS: see HPI Past Medical History: reviewed and updated medications and allergies. Social History: Tobacco: 1/2 pack per day  Objective: Filed Vitals:   11/29/12 0833  BP: 129/79  Pulse: 100    Physical Examination:   General appearance - alert, well appearing, and in no distress Chest - clear to auscultation, no wheezes, rales or rhonchi, symmetric air entry Heart - tachycardic, regular rhythm, normal S1, S2, no murmurs Back - tenderness along right paralumbar muscle, no tenderness along spine, no tenderness along greater trochanter bilaterally Limited external and internal rotation of hip but no reproducible pain 4+/5 hip flexion bilaterally, 4+/5 knee flexion and extension bilaterally, 4+/5 plantar and dorsiflexion bilaterally Negative straight leg

## 2012-11-29 NOTE — Assessment & Plan Note (Addendum)
Patient contemplating re-evaluation by neurosurgery. She was initially evaluated in October 2012 when she first experienced pain and weakness. Surgery was recommended at the time but she refused it.  Patient has trip in October and doesn't want surgery until then. Will refer her when she is ready.  For now, refill on morphine 15mg  tid. Will change flexeril to robaxin and see if there is any difference. She does have significant muscle spasms in lower back.   Filled out handicap placard form

## 2013-01-03 ENCOUNTER — Encounter: Payer: Self-pay | Admitting: Family Medicine

## 2013-01-03 ENCOUNTER — Ambulatory Visit (INDEPENDENT_AMBULATORY_CARE_PROVIDER_SITE_OTHER): Payer: Medicare HMO | Admitting: Family Medicine

## 2013-01-03 VITALS — BP 151/72 | HR 81 | Temp 98.1°F | Ht 63.0 in | Wt 165.0 lb

## 2013-01-03 DIAGNOSIS — K219 Gastro-esophageal reflux disease without esophagitis: Secondary | ICD-10-CM | POA: Insufficient documentation

## 2013-01-03 DIAGNOSIS — M5432 Sciatica, left side: Secondary | ICD-10-CM

## 2013-01-03 DIAGNOSIS — M79609 Pain in unspecified limb: Secondary | ICD-10-CM

## 2013-01-03 DIAGNOSIS — J45909 Unspecified asthma, uncomplicated: Secondary | ICD-10-CM

## 2013-01-03 DIAGNOSIS — M5430 Sciatica, unspecified side: Secondary | ICD-10-CM

## 2013-01-03 DIAGNOSIS — M79605 Pain in left leg: Secondary | ICD-10-CM | POA: Insufficient documentation

## 2013-01-03 MED ORDER — METHOCARBAMOL 500 MG PO TABS: 500.0000 mg | ORAL_TABLET | Freq: Four times a day (QID) | ORAL | Status: DC

## 2013-01-03 MED ORDER — OMEPRAZOLE 20 MG PO CPDR: 20.0000 mg | DELAYED_RELEASE_CAPSULE | Freq: Every day | ORAL | Status: DC

## 2013-01-03 MED ORDER — MORPHINE SULFATE 15 MG PO TABS: 15.0000 mg | ORAL_TABLET | Freq: Three times a day (TID) | ORAL | Status: DC | PRN

## 2013-01-03 NOTE — Assessment & Plan Note (Signed)
Initially thought to be from lower back pain but appears to be more isolated. xrays of the leg in the past have been normal. Pulse present making claudication unlikely.  Treat locally with aspercreme and monitor. If not improved, consider voltaren gel.

## 2013-01-03 NOTE — Patient Instructions (Addendum)
For the heartburn, avoid tomatoes and spicy foods. You can try pepto bysmol for quick comfort.   For the leg, try aspercreme to see if that helps. If it doesn't we can try something stronger.   I will see you back in 1 month.

## 2013-01-03 NOTE — Progress Notes (Signed)
Patient ID: Raven [name]    DOB: 06/20/1950, 62 y.o.   MRN: [id number] --- Subjective:  Raven [name] is a 63 y.o.female who presents for follow up. Concerns include: - left leg pain: worst in the last 2 days, throbbing pain, intermittent, occurs both at rest and with walking. Worst with plantarflexion and inversion of foot. Located in distal part of leg both on medial and lateral side, occasionally travels up to the knee and hip.   - back pain: stable, same as usual. No new weakness, bowel or urine incontinence. Continues walking with walker outside and on her own at home. No change in activity.   - reflux: started last night after eating pork skins and tomatoe based stew. Burning sensation in center of chest. No nausea, no vomiting, no melena. Took 4 zantac without help then took tums with mild relief. No sensation of food being stuck. No recent weight loss.    ROS: see HPI Past Medical History: reviewed and updated medications and allergies. Social History: Tobacco: 1 pack in 5 days  Objective: Filed Vitals:   01/03/13 0846  BP: 151/72  Pulse: 81  Temp: 98.1 F (36.7 C)    Physical Examination:   General appearance - alert, well appearing, and in no distress Chest - clear to auscultation, no wheezes, rales or rhonchi, symmetric air entry Heart - normal rate, regular rhythm, normal S1, S2, no murmurs Abdomen - soft, tender in epigastrium Extremities - dorsalis pedis pulse present bilaterally, 4+/5 strength with knee flexion, hip flexion, dorsiflexion and plantarfexion of foot on left compared to 5/5 on right. Tenderness along anterior aspect of distal tibia, normal range of motion of foot and ankle.  Tenderness along right paralumbar muscle, no spinal tenderness, tenderness along left greater trochanter.

## 2013-01-03 NOTE — Assessment & Plan Note (Addendum)
Refilled robaxin and MSIR 15mg  tid. Now that patient has insurance she may benefit from long acting morphine. Will reassess at next visit.

## 2013-01-03 NOTE — Assessment & Plan Note (Signed)
Reviewed smoking cessation. She wants to quit but is not quite ready. Thinks maybe by the end of the year.  Pneumonia shot next visit.

## 2013-01-03 NOTE — Assessment & Plan Note (Addendum)
On ranitidine. Appears to be triggered by foods. 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 Will send Rx for prilosec and reassess in 4 weeks. No red flags. Reviewed what foods to avoid.

## 2013-02-03 ENCOUNTER — Encounter: Payer: Self-pay | Admitting: Family Medicine

## 2013-02-03 ENCOUNTER — Ambulatory Visit (INDEPENDENT_AMBULATORY_CARE_PROVIDER_SITE_OTHER): Payer: Medicare HMO | Admitting: Family Medicine

## 2013-02-03 VITALS — BP 137/64 | HR 90 | Ht 63.0 in | Wt 166.3 lb

## 2013-02-03 DIAGNOSIS — M5432 Sciatica, left side: Secondary | ICD-10-CM

## 2013-02-03 DIAGNOSIS — M25552 Pain in left hip: Secondary | ICD-10-CM

## 2013-02-03 DIAGNOSIS — E785 Hyperlipidemia, unspecified: Secondary | ICD-10-CM

## 2013-02-03 DIAGNOSIS — M5430 Sciatica, unspecified side: Secondary | ICD-10-CM

## 2013-02-03 DIAGNOSIS — M5442 Lumbago with sciatica, left side: Secondary | ICD-10-CM

## 2013-02-03 DIAGNOSIS — M25559 Pain in unspecified hip: Secondary | ICD-10-CM

## 2013-02-03 LAB — POCT GLYCOSYLATED HEMOGLOBIN (HGB A1C): Hemoglobin A1C: 5.4

## 2013-02-03 MED ORDER — MORPHINE SULFATE ER 30 MG PO TBCR: 30.0000 mg | EXTENDED_RELEASE_TABLET | Freq: Two times a day (BID) | ORAL | Status: DC

## 2013-02-03 MED ORDER — KETOROLAC TROMETHAMINE 30 MG/ML IJ SOLN
30.0000 mg | Freq: Once | INTRAMUSCULAR | Status: AC
  Administered 2013-02-03: 30 mg via INTRAMUSCULAR

## 2013-02-03 MED ORDER — CYCLOBENZAPRINE HCL 10 MG PO TABS: 10.0000 mg | ORAL_TABLET | Freq: Three times a day (TID) | ORAL | Status: DC | PRN

## 2013-02-03 MED ORDER — KETOROLAC TROMETHAMINE 30 MG/ML IM SOLN: 30.0000 mg | Freq: Once | INTRAMUSCULAR | Status: DC

## 2013-02-03 NOTE — Patient Instructions (Addendum)
For next appointment, come fasting, no food or drink after midnight.   For the constipation, please take miralax to help.

## 2013-02-04 NOTE — Progress Notes (Signed)
Patient ID: Raven [name]    DOB: Aug 23, 1950, 62 y.o.   MRN: [id number] --- Subjective:  Raven [name] is a 62 y.o.female with h/o lumbar spinal stenosis and annular tear on 2012 MRI, asthma who presents for monthly follow up on chronic back pain.   - back and left thigh pain: over the weekend was in pain to the point of feeling like she needed to go to the ED. She did not knowing that she would have an appointment in clinic soon. She states that the pain was in her lower back, left thigh and left leg. Pain was intense in nature. Walking made it worst, laying down helped. Pain was 20/10. She did endorse pain in her thigh from laying on her left side. She was able to walk to the corner store with a cane but afterwards had increased back pain. She denies any worsening weakness, any urine or bowel incontinence or foot drop.  She states that the robaxin did not help. She has been taking her medications as prescribed.    ROS: see HPI Of note: she states that her mouth has been more dry than usual and that she has been drinking water more than usual.  Past Medical History: reviewed and updated medications and allergies. Social History: Tobacco: current smoker  Objective: Filed Vitals:   02/03/13 0834  BP: 137/64  Pulse: 90    Physical Examination:   General appearance - alert, well appearing, and in discomfort from pain Chest - clear to auscultation, no wheezes, rales or rhonchi, symmetric air entry Heart - normal rate, regular rhythm, normal S1, S2, 2/6 systolic murmur best heard at right sternal border MSK - tenderness to palpation along left paralumbar muscles, tenderness along left greater trochanter 4+/5 strength with knee flexion and extension bilaterally, 4+/5 strength with dorsiflexion and plantarflexion of foot bilaterally.  2+ patellar reflex on right, no patellar reflex on left

## 2013-02-04 NOTE — Assessment & Plan Note (Signed)
Patient to come fasting at her next appointment for CMP and lipid panel. Obtained A1C today in setting of her drinking more water and sensation of dry mouth. A1c normal at 5.4

## 2013-02-04 NOTE — Assessment & Plan Note (Addendum)
Flare up of sciatica without red flags on exam or history. Strength remains good despite the pain. She continues to walk and tries to stay active. She had been on MS contin in the past at 30mg  bid which had been changed to morphine IR 15mg  tid due to cost. Now that she has insurance, I think it is reasonable to go back to MS contin 30mg  bid (this is a little more than what she has recently been getting, but back to her initial dosing which had to be switched out due to cost).  - will also give toradol 30 IM shot today in office - switch back from robaxin to flexeril since she did not get relief with robaxin.  - continue gabapentin 1200mg  tid - continue naproxen - check BMP for kidney function next month

## 2013-02-04 NOTE — Assessment & Plan Note (Signed)
Again, appears very much like trochanteric bursitis. Have recommended to patient that she go to sports medicine for injection, but she has not gone yet. Will offer this again at her next visit.

## 2013-02-21 ENCOUNTER — Ambulatory Visit
Admission: RE | Admit: 2013-02-21 | Discharge: 2013-02-21 | Disposition: A | Discharge status: Home / Self Care | Payer: Medicare HMO | Source: Ambulatory Visit

## 2013-02-21 DIAGNOSIS — Z1231 Encounter for screening mammogram for malignant neoplasm of breast: Secondary | ICD-10-CM

## 2013-03-07 ENCOUNTER — Telehealth: Payer: Self-pay | Admitting: Family Medicine

## 2013-03-07 ENCOUNTER — Encounter: Payer: Self-pay | Admitting: Family Medicine

## 2013-03-07 ENCOUNTER — Ambulatory Visit (INDEPENDENT_AMBULATORY_CARE_PROVIDER_SITE_OTHER): Payer: Medicare HMO | Admitting: Family Medicine

## 2013-03-07 VITALS — BP 129/79 | HR 96 | Ht 63.0 in | Wt 162.9 lb

## 2013-03-07 DIAGNOSIS — M5432 Sciatica, left side: Secondary | ICD-10-CM

## 2013-03-07 DIAGNOSIS — M25552 Pain in left hip: Secondary | ICD-10-CM

## 2013-03-07 DIAGNOSIS — M25559 Pain in unspecified hip: Secondary | ICD-10-CM

## 2013-03-07 DIAGNOSIS — M5430 Sciatica, unspecified side: Secondary | ICD-10-CM

## 2013-03-07 DIAGNOSIS — I10 Essential (primary) hypertension: Secondary | ICD-10-CM

## 2013-03-07 LAB — CBC
HCT: 38.9 % (ref 36.0–46.0)
Hemoglobin: 13.2 g/dL (ref 12.0–15.0)
MCH: 30.8 pg (ref 26.0–34.0)
MCHC: 33.9 g/dL (ref 30.0–36.0)
MCV: 90.7 fL (ref 78.0–100.0)
RBC: 4.29 MIL/uL (ref 3.87–5.11)

## 2013-03-07 LAB — COMPREHENSIVE METABOLIC PANEL
AST: 27 U/L (ref 0–37)
Albumin: 4.3 g/dL (ref 3.5–5.2)
Alkaline Phosphatase: 86 U/L (ref 39–117)
BUN: 16 mg/dL (ref 6–23)
CO2: 24 mEq/L (ref 19–32)
Calcium: 9.6 mg/dL (ref 8.4–10.5)
Creat: 0.96 mg/dL (ref 0.50–1.10)
Glucose, Bld: 96 mg/dL (ref 70–99)
Potassium: 4.5 mEq/L (ref 3.5–5.3)
Sodium: 137 mEq/L (ref 135–145)

## 2013-03-07 LAB — LIPID PANEL
Cholesterol: 210 mg/dL — ABNORMAL HIGH (ref 0–200)
Total CHOL/HDL Ratio: 5.5 Ratio
Triglycerides: 164 mg/dL — ABNORMAL HIGH (ref <150)
VLDL: 33 mg/dL (ref 0–40)

## 2013-03-07 MED ORDER — MORPHINE SULFATE ER 30 MG PO TBCR: 30.0000 mg | EXTENDED_RELEASE_TABLET | Freq: Two times a day (BID) | ORAL | Status: DC

## 2013-03-07 MED ORDER — GABAPENTIN 800 MG PO TABS: 800.0000 mg | ORAL_TABLET | Freq: Three times a day (TID) | ORAL | Status: DC

## 2013-03-07 MED ORDER — GABAPENTIN 400 MG PO CAPS: 400.0000 mg | ORAL_CAPSULE | Freq: Three times a day (TID) | ORAL | Status: DC

## 2013-03-07 MED ORDER — CYCLOBENZAPRINE HCL 10 MG PO TABS: 10.0000 mg | ORAL_TABLET | Freq: Three times a day (TID) | ORAL | Status: DC | PRN

## 2013-03-07 MED ORDER — NAPROXEN 500 MG PO TABS: 500.0000 mg | ORAL_TABLET | Freq: Two times a day (BID) | ORAL | Status: DC

## 2013-03-07 NOTE — Assessment & Plan Note (Signed)
Possible trochanteric bursitis causing pain in left lateral thigh again in tenderness to palpation along greater trochanter. Given her extensive lumbar spine pathology, would like a second opinion from sports medicine clinic to see if her left lateral thigh pain could be from greater trochanteric bursitis and whether she would benefit from a steroid injection.

## 2013-03-07 NOTE — Progress Notes (Signed)
Patient ID: CARLETTA FEASEL    DOB: 04/20/1951, 62 y.o.   MRN: [id number] --- Subjective:  Remie is a 62 y.o.female h/o lumbar spinal stenosis and annular tear on 2012 MRI who presents for followup on lower back pain and medication refill. - Low back pain: Unchanged from prior. She has not had a flareup of pain since her last visit. Pain is located in the lower back radiates to the left lateral thigh down to her leg. She has been able to ambulate with her walker. She denies any new onset weakness, urine or bowel incontinence. She has also been having pain in her right lower back. She is taking morphine ER 30 mg twice a day. This was changed last month from morphine IR 15 mg 3 times a day. She has not noticed a significant difference. She has also been taking the Flexeril 10 mg 3 times a day which has helped. She continues to take gabapentin 1200 mg 3 times a day.   ROS: see HPI Past Medical History: reviewed and updated medications and allergies. Social History: Tobacco: Current smoker  Objective: Filed Vitals:   03/07/13 0827  BP: 129/79  Pulse: 96    Physical Examination:   General appearance - alert, well appearing, and in no distress Chest - clear to auscultation, no wheezes, rales or rhonchi, symmetric air entry Heart - normal rate, regular rhythm, normal S1, S2, no murmurs, rubs, clicks or gallops Low back-tenderness to palpation along the right paralumbar muscle, no tenderness along sciatic notch. No tenderness along spine. Tenderness to palpation along the greater trochanter on the left side. Normal SI joints mobility. Decreased flexibility with FABER more prominent on left than right. Decreased hip flexion bilaterally due to pain. Negative straight leg. 4+ out of 5 strength with hip flexion on the left compared to right. 4+ out 5 knee extension and flexion on left compared to 5/5 on right.

## 2013-03-07 NOTE — Assessment & Plan Note (Signed)
Fasting lipid and cemented obtained today.

## 2013-03-07 NOTE — Telephone Encounter (Signed)
Called pt back and clarified after speaking with Dr.Losq. Pt needs to be evaluated at the [facility]. Also, informed pt that her meds were sent to the mail order company already. [name], [name]

## 2013-03-07 NOTE — Assessment & Plan Note (Addendum)
Patient not interested in seeing neurosurgery at this time as she is not interested in surgery. Unclear whether she would benefit from spinal injections. Will discuss this possibility next visit. Refill morphine, gabapentin, naproxen, Flexeril.  Will also refer to physical therapy which patient is interested in doing.

## 2013-03-07 NOTE — Telephone Encounter (Signed)
Called pt.

## 2013-03-07 NOTE — Patient Instructions (Signed)
Please make an appointment at the sports medicine clinic so that you can be evaluated for possible injection in your hip.   I will send you a letter with the results. If there is anything to talk about, I will call you.

## 2013-03-07 NOTE — Telephone Encounter (Signed)
Called pt back. She request gabapentin and naproxen to be sent through her mail order. Also, I told the pt that I would fax a request for the pain center and they will get in contact with her. It will take some time for her to get an appt. Pt verbalized understanding. Fwd. To PCP for info. [name], [name]

## 2013-03-07 NOTE — Telephone Encounter (Signed)
Patient needs an appt to pain management. She was unable to make it herself. Also, she would like to remind Dr. Gwenlyn Saran to send in prescription in today.

## 2013-03-25 ENCOUNTER — Telehealth: Payer: Self-pay | Admitting: Family Medicine

## 2013-03-25 NOTE — Telephone Encounter (Signed)
Pt needs to go back to morphine pill 15 mg taken 3 times a day. The 12 hr pill is too expensive She doesn't have enough to last until her appt on tues. She will come pick it up when it is ready

## 2013-03-25 NOTE — Telephone Encounter (Signed)
Will fwd to MD.  Otniel Hoe L, CMA  

## 2013-03-27 ENCOUNTER — Telehealth: Payer: Self-pay | Admitting: Family Medicine

## 2013-03-27 MED ORDER — MORPHINE SULFATE 15 MG PO TABS: 15.0000 mg | ORAL_TABLET | Freq: Three times a day (TID) | ORAL | Status: DC | PRN

## 2013-03-27 NOTE — Telephone Encounter (Signed)
Will change her Rx to Morphine IR 15mg  tid/prn. 90 tabs, no refills. When she comes into the office, we will discuss this further.  Please let patient know that Rx is ready for pick up.  Thank you!  Marena Chancy, PGY-3 Family Medicine Resident

## 2013-03-27 NOTE — Telephone Encounter (Signed)
LMVM to call back. Please see previous message. Thank you .Arlyss Repress

## 2013-04-01 ENCOUNTER — Ambulatory Visit (INDEPENDENT_AMBULATORY_CARE_PROVIDER_SITE_OTHER): Payer: Medicare HMO | Admitting: Family Medicine

## 2013-04-01 VITALS — BP 130/79 | HR 101 | Temp 98.4°F | Ht 62.0 in | Wt 162.0 lb

## 2013-04-01 DIAGNOSIS — Z23 Encounter for immunization: Secondary | ICD-10-CM

## 2013-04-01 DIAGNOSIS — E785 Hyperlipidemia, unspecified: Secondary | ICD-10-CM

## 2013-04-01 DIAGNOSIS — M25559 Pain in unspecified hip: Secondary | ICD-10-CM

## 2013-04-01 DIAGNOSIS — M5430 Sciatica, unspecified side: Secondary | ICD-10-CM

## 2013-04-01 DIAGNOSIS — M5432 Sciatica, left side: Secondary | ICD-10-CM

## 2013-04-01 DIAGNOSIS — M25552 Pain in left hip: Secondary | ICD-10-CM

## 2013-04-01 MED ORDER — ROSUVASTATIN CALCIUM 10 MG PO TABS
10.0000 mg | ORAL_TABLET | Freq: Every day | ORAL | Status: DC
Start: 2013-04-01 — End: 2013-04-15

## 2013-04-01 NOTE — Assessment & Plan Note (Signed)
Patient agreed to start crestor 10mg  daily.  Repeat lipid panel in 6 months

## 2013-04-01 NOTE — Patient Instructions (Signed)
Please call the sports medicine clinic for an appointment: [phone] If this number doesn't work, please call our clinic and ask for another number.

## 2013-04-01 NOTE — Assessment & Plan Note (Signed)
Better today. As mentioned previously, there could be a component of trochanteric bursitis, but given her extensive lumbar spine etiology, would like second opinion from sports medicine to see if her lateral thigh pain could be from trochanteric bursitis and whether she would benfit from steroid injection.  Gave number to sports medicine to call. Referral placed.

## 2013-04-01 NOTE — Assessment & Plan Note (Addendum)
Continue morphine, gabapentin, naproxen. Back on morphine IR 15mg  tid filled on 03/27/13.

## 2013-04-01 NOTE — Progress Notes (Signed)
Patient ID: Raven [name]    DOB: 06-06-1950, 62 y.o.   MRN: [id number] --- Subjective:  Raven [name] is a 62 y.o.female who presents for medication refill: - low back pain and left leg pain: better these last few days. She continues to walk around her house and at the super market with a shopping cart. Recently, she has been able to walk with a cane instead of her walker. She has overall been feeling better for the last 3 weeks. She did have a flare up of pain over the weekend where she had shooting pain on the right side as well as in the left lower back.   - hyperlipidemia: seen on recent lipid panel. Patient reports eating cheese, whole milk, ice cream, dishes made with butter.    ROS: see HPI Past Medical History: reviewed and updated medications and allergies. Social History: Tobacco: current smoker  Objective: Filed Vitals:   04/01/13 1031  BP: 130/79  Pulse: 101  Temp: 98.4 F (36.9 C)    Physical Examination:   General appearance - alert, well appearing, and in no distress MSK - tenderness along left paraspinal muscles, mild tenderness along left greater trochanter, 5/5 strength in lower extremities bilaterally.

## 2013-04-02 ENCOUNTER — Telehealth: Payer: Self-pay

## 2013-04-02 NOTE — Telephone Encounter (Signed)
Will fwd to MD for advice.  Rolland Steinert L, CMA  

## 2013-04-02 NOTE — Telephone Encounter (Signed)
Please let patient know that that is ok as long as she doesn't do any movement to hurt her low back and hip. No high impact. No fast bending or extending of the back. No jumping.   Raven [name], PGY-3 Family Medicine Resident

## 2013-04-02 NOTE — Telephone Encounter (Signed)
Patient seen Dr. Gwenlyn Saran 11/11 and was told she needed to go on a diet. Patient calls wanting to know if it would be ok if she were to start dancing? She states she would not over do it, but enjoys it and she feels it will help with weight management. Please call patient.

## 2013-04-03 NOTE — Telephone Encounter (Signed)
Called pt. No answer. Please see message. Thanks. [name], [name]

## 2013-04-14 ENCOUNTER — Observation Stay (HOSPITAL_COMMUNITY)
Admission: EM | Admit: 2013-04-14 | Discharge: 2013-04-15 | Disposition: A | Discharge status: Home / Self Care | Payer: Medicare HMO | Attending: Family Medicine | Admitting: Family Medicine

## 2013-04-14 ENCOUNTER — Emergency Department (HOSPITAL_COMMUNITY): Payer: Medicare HMO

## 2013-04-14 ENCOUNTER — Ambulatory Visit (INDEPENDENT_AMBULATORY_CARE_PROVIDER_SITE_OTHER): Payer: Medicare HMO | Admitting: Sports Medicine

## 2013-04-14 ENCOUNTER — Other Ambulatory Visit: Payer: Self-pay | Admitting: Sports Medicine

## 2013-04-14 ENCOUNTER — Encounter (HOSPITAL_COMMUNITY): Payer: Self-pay | Admitting: Emergency Medicine

## 2013-04-14 ENCOUNTER — Encounter: Payer: Self-pay | Admitting: Sports Medicine

## 2013-04-14 VITALS — BP 126/81 | Ht 63.0 in | Wt 160.0 lb

## 2013-04-14 DIAGNOSIS — R079 Chest pain, unspecified: Secondary | ICD-10-CM | POA: Insufficient documentation

## 2013-04-14 DIAGNOSIS — IMO0002 Reserved for concepts with insufficient information to code with codable children: Secondary | ICD-10-CM

## 2013-04-14 DIAGNOSIS — M25552 Pain in left hip: Secondary | ICD-10-CM

## 2013-04-14 DIAGNOSIS — F17200 Nicotine dependence, unspecified, uncomplicated: Secondary | ICD-10-CM | POA: Insufficient documentation

## 2013-04-14 DIAGNOSIS — G8929 Other chronic pain: Secondary | ICD-10-CM | POA: Insufficient documentation

## 2013-04-14 DIAGNOSIS — E785 Hyperlipidemia, unspecified: Secondary | ICD-10-CM | POA: Insufficient documentation

## 2013-04-14 DIAGNOSIS — M5416 Radiculopathy, lumbar region: Secondary | ICD-10-CM

## 2013-04-14 DIAGNOSIS — R51 Headache: Secondary | ICD-10-CM | POA: Insufficient documentation

## 2013-04-14 DIAGNOSIS — R55 Syncope and collapse: Secondary | ICD-10-CM | POA: Insufficient documentation

## 2013-04-14 DIAGNOSIS — J45909 Unspecified asthma, uncomplicated: Secondary | ICD-10-CM | POA: Insufficient documentation

## 2013-04-14 DIAGNOSIS — I10 Essential (primary) hypertension: Secondary | ICD-10-CM | POA: Insufficient documentation

## 2013-04-14 DIAGNOSIS — I959 Hypotension, unspecified: Principal | ICD-10-CM | POA: Insufficient documentation

## 2013-04-14 DIAGNOSIS — R4182 Altered mental status, unspecified: Secondary | ICD-10-CM | POA: Insufficient documentation

## 2013-04-14 DIAGNOSIS — K219 Gastro-esophageal reflux disease without esophagitis: Secondary | ICD-10-CM | POA: Insufficient documentation

## 2013-04-14 DIAGNOSIS — M549 Dorsalgia, unspecified: Secondary | ICD-10-CM

## 2013-04-14 LAB — COMPREHENSIVE METABOLIC PANEL
AST: 30 U/L (ref 0–37)
Albumin: 3.4 g/dL — ABNORMAL LOW (ref 3.5–5.2)
Alkaline Phosphatase: 97 U/L (ref 39–117)
BUN: 16 mg/dL (ref 6–23)
CO2: 19 mEq/L (ref 19–32)
Calcium: 9.1 mg/dL (ref 8.4–10.5)
Chloride: 100 mEq/L (ref 96–112)
Creatinine, Ser: 0.85 mg/dL (ref 0.50–1.10)
GFR calc Af Amer: 83 mL/min — ABNORMAL LOW (ref >90)
GFR calc non Af Amer: 71 mL/min — ABNORMAL LOW (ref >90)
Glucose, Bld: 116 mg/dL — ABNORMAL HIGH (ref 70–99)
Total Bilirubin: 0.1 mg/dL — ABNORMAL LOW (ref 0.3–1.2)

## 2013-04-14 LAB — CBC WITH DIFFERENTIAL/PLATELET
Eosinophils Relative: 2 % (ref 0–5)
HCT: 34 % — ABNORMAL LOW (ref 36.0–46.0)
Lymphs Abs: 1.6 10*3/uL (ref 0.7–4.0)
MCH: 31.9 pg (ref 26.0–34.0)
MCHC: 35 g/dL (ref 30.0–36.0)
MCV: 91.2 fL (ref 78.0–100.0)
Monocytes Absolute: 0.7 10*3/uL (ref 0.1–1.0)
Monocytes Relative: 9 % (ref 3–12)
Neutro Abs: 5.8 10*3/uL (ref 1.7–7.7)
Platelets: 287 10*3/uL (ref 150–400)
RBC: 3.73 MIL/uL — ABNORMAL LOW (ref 3.87–5.11)
WBC: 8.3 10*3/uL (ref 4.0–10.5)

## 2013-04-14 LAB — URINE MICROSCOPIC-ADD ON

## 2013-04-14 LAB — TROPONIN I: Troponin I: <0.3 ng/mL (ref <0.30)

## 2013-04-14 LAB — URINALYSIS, ROUTINE W REFLEX MICROSCOPIC
Bilirubin Urine: NEGATIVE
Glucose, UA: NEGATIVE mg/dL
Ketones, ur: NEGATIVE mg/dL
Protein, ur: NEGATIVE mg/dL
Urobilinogen, UA: 0.2 mg/dL (ref 0.0–1.0)

## 2013-04-14 LAB — RAPID URINE DRUG SCREEN, HOSP PERFORMED
Amphetamines: NOT DETECTED
Barbiturates: NOT DETECTED
Opiates: NOT DETECTED

## 2013-04-14 MED ORDER — CYCLOBENZAPRINE HCL 10 MG PO TABS
10.0000 mg | ORAL_TABLET | Freq: Three times a day (TID) | ORAL | Status: DC | PRN
  Administered 2013-04-15: 10 mg via ORAL
  Filled 2013-04-14: qty 1

## 2013-04-14 MED ORDER — FLUTICASONE PROPIONATE HFA 110 MCG/ACT IN AERO
1.0000 | INHALATION_SPRAY | Freq: Two times a day (BID) | RESPIRATORY_TRACT | Status: DC
  Administered 2013-04-15: 1 via RESPIRATORY_TRACT
  Filled 2013-04-14: qty 12

## 2013-04-14 MED ORDER — ENOXAPARIN SODIUM 40 MG/0.4ML ~~LOC~~ SOLN
40.0000 mg | SUBCUTANEOUS | Status: DC
  Administered 2013-04-15: 40 mg via SUBCUTANEOUS
  Filled 2013-04-14: qty 0.4

## 2013-04-14 MED ORDER — KETOROLAC TROMETHAMINE 30 MG/ML IJ SOLN
30.0000 mg | Freq: Once | INTRAMUSCULAR | Status: AC
  Administered 2013-04-14: 30 mg via INTRAVENOUS
  Filled 2013-04-14: qty 1

## 2013-04-14 MED ORDER — ACETAMINOPHEN 325 MG PO TABS
650.0000 mg | ORAL_TABLET | Freq: Four times a day (QID) | ORAL | Status: DC | PRN
  Administered 2013-04-15: 650 mg via ORAL
  Filled 2013-04-14: qty 2

## 2013-04-14 MED ORDER — CALCIUM-MAGNESIUM-VITAMIN D ER 600-40-500 MG-MG-UNIT PO TB24: 1.0000 | ORAL_TABLET | Freq: Two times a day (BID) | ORAL | Status: DC

## 2013-04-14 MED ORDER — SODIUM CHLORIDE 0.9 % IJ SOLN
3.0000 mL | Freq: Two times a day (BID) | INTRAMUSCULAR | Status: DC
  Administered 2013-04-15 (×2): 3 mL via INTRAVENOUS

## 2013-04-14 MED ORDER — SODIUM CHLORIDE 0.9 % IV BOLUS (SEPSIS)
500.0000 mL | Freq: Once | INTRAVENOUS | Status: AC
  Administered 2013-04-14: 500 mL via INTRAVENOUS

## 2013-04-14 MED ORDER — NALOXONE HCL 0.4 MG/ML IJ SOLN
0.2000 mg | Freq: Once | INTRAMUSCULAR | Status: AC
  Administered 2013-04-14: 0.2 mg via INTRAVENOUS
  Filled 2013-04-14: qty 1

## 2013-04-14 MED ORDER — ATORVASTATIN CALCIUM 20 MG PO TABS
20.0000 mg | ORAL_TABLET | Freq: Every day | ORAL | Status: DC
  Filled 2013-04-14: qty 1

## 2013-04-14 MED ORDER — ASPIRIN EC 81 MG PO TBEC
81.0000 mg | DELAYED_RELEASE_TABLET | Freq: Every day | ORAL | Status: DC
  Administered 2013-04-15: 81 mg via ORAL
  Filled 2013-04-14: qty 1

## 2013-04-14 MED ORDER — SODIUM CHLORIDE 0.9 % IV SOLN
INTRAVENOUS | Status: DC
  Administered 2013-04-15: via INTRAVENOUS

## 2013-04-14 MED ORDER — BIOTIN 10 MG PO TABS: 10.0000 mg | ORAL_TABLET | Freq: Every day | ORAL | Status: DC

## 2013-04-14 MED ORDER — ALBUTEROL SULFATE HFA 108 (90 BASE) MCG/ACT IN AERS
1.0000 | INHALATION_SPRAY | RESPIRATORY_TRACT | Status: DC | PRN
  Filled 2013-04-14: qty 6.7

## 2013-04-14 NOTE — ED Provider Notes (Signed)
CSN: [id number]     Arrival date & time 04/14/13  1626 History   First MD Initiated Contact with Patient 04/14/13 1643     Chief Complaint  Patient presents with  . Hypotension  . Dizziness  level 5 caveat due to altered mental status.  (Consider location/radiation/quality/duration/timing/severity/associated sxs/prior Treatment) The history is provided by the patient and the EMS personnel.  brought in for hypotension and dizziness. Reported onset this afternoon. Patient states that she saw the sports medicine doctor earlier today for her chronic back pain. She repeatedly becomes less responsive during my examination    Past Medical History  Diagnosis Date  . Hypertension   . Spinal stenosis of lumbar region   . Asthma   . Tobacco abuse   . GERD (gastroesophageal reflux disease)   . Generalized headaches     ocasional, she uses naproxen.   Past Surgical History  Procedure Laterality Date  . Cesarean section      x2  . Tracheostomy      when she was 20   History reviewed. No pertinent family history. History  Substance Use Topics  . Smoking status: Current Every Day Smoker -- 0.30 packs/day    Types: Cigarettes  . Smokeless tobacco: Never Used     Comment: will quitt this year  . Alcohol Use: No   OB History   Grav Para Term Preterm Abortions TAB SAB Ect Mult Living                 Review of Systems  Unable to perform ROS   Allergies  Procaine hcl  Home Medications   Current Outpatient Rx  Name  Route  Sig  Dispense  Refill  . albuterol (PROVENTIL HFA;VENTOLIN HFA) 108 (90 BASE) MCG/ACT inhaler   Inhalation   Inhale 1-2 puffs into the lungs every 4 (four) hours as needed. For wheezing   1 Inhaler   1   . Biotin 10 MG TABS   Oral   Take 10 mg by mouth daily.          . Calcium-Magnesium-Vitamin D (CITRACAL CALCIUM+D) 600-40-500 MG-MG-UNIT TB24   Oral   Take 1 tablet by mouth 2 (two) times daily.          . cyclobenzaprine (FLEXERIL) 10 MG  tablet   Oral   Take 1 tablet (10 mg total) by mouth 3 (three) times daily as needed for muscle spasms.   90 tablet   1   . fluticasone (FLOVENT HFA) 110 MCG/ACT inhaler   Inhalation   Inhale 1 puff into the lungs 2 (two) times daily.   1 Inhaler   12   . gabapentin (NEURONTIN) 400 MG capsule   Oral   Take 1 capsule (400 mg total) by mouth 3 (three) times daily.   180 capsule   5   . gabapentin (NEURONTIN) 800 MG tablet   Oral   Take 1 tablet (800 mg total) by mouth 3 (three) times daily.   180 tablet   6   . lisinopril-hydrochlorothiazide (PRINZIDE,ZESTORETIC) 20-25 MG per tablet   Oral   Take 0.5 tablets by mouth daily. Take 1/2 tablet by mouth daily for high blood pressure.   90 tablet   3   . morphine (MSIR) 15 MG tablet   Oral   Take 1 tablet (15 mg total) by mouth 3 (three) times daily as needed for severe pain.   90 tablet   0   . naproxen (NAPROSYN) 500 MG  tablet   Oral   Take 1 tablet (500 mg total) by mouth 2 (two) times daily with a meal.   60 tablet   4   . rosuvastatin (CRESTOR) 10 MG tablet   Oral   Take 1 tablet (10 mg total) by mouth daily.   90 tablet   3    BP 102/47  Pulse 83  Temp(Src) 98.3 F (36.8 C) (Oral)  Resp 11  SpO2 100% Physical Exam  Constitutional: She appears well-developed and well-nourished.  HENT:  Head: Normocephalic.  Eyes:  Pupils constricted.   Cardiovascular: Normal rate and regular rhythm.   Pulmonary/Chest: Effort normal and breath sounds normal.  Abdominal: Soft.  Musculoskeletal: She exhibits no tenderness.  Neurological:  Will answer questions, but decreased LOC. Pupils constricted. Moves extremities  Skin: Skin is warm.    ED Course  Procedures (including critical care time) Labs Review Labs Reviewed  CBC WITH DIFFERENTIAL - Abnormal; Notable for the following:    RBC 3.73 (*)    Hemoglobin 11.9 (*)    HCT 34.0 (*)    All other components within normal limits  COMPREHENSIVE METABOLIC PANEL -  Abnormal; Notable for the following:    Sodium 132 (*)    Glucose, Bld 116 (*)    Albumin 3.4 (*)    Total Bilirubin 0.1 (*)    GFR calc non Af Amer 71 (*)    GFR calc Af Amer 83 (*)    All other components within normal limits  URINALYSIS, ROUTINE W REFLEX MICROSCOPIC - Abnormal; Notable for the following:    Leukocytes, UA SMALL (*)    All other components within normal limits  URINE RAPID DRUG SCREEN (HOSP PERFORMED) - Abnormal; Notable for the following:    Tetrahydrocannabinol POSITIVE (*)    All other components within normal limits  URINE MICROSCOPIC-ADD ON - Abnormal; Notable for the following:    Squamous Epithelial / LPF FEW (*)    Casts HYALINE CASTS (*)    All other components within normal limits  TROPONIN I   Imaging Review Ct Head Wo Contrast  04/14/2013   CLINICAL DATA:  Dizziness and chest pain  EXAM: CT HEAD WITHOUT CONTRAST  TECHNIQUE: Contiguous axial images were obtained from the base of the skull through the vertex without intravenous contrast.  COMPARISON:  01/03/2011  FINDINGS: The bony calvarium is intact. No gross soft tissue abnormality is noted. Opacification is seen within the ethmoid sinuses and left maxillary sinus. This appears chronic in nature. The ventricles are again mildly prominent. Mild atrophic changes are seen. No findings to suggest acute hemorrhage, acute infarction or space-occupying mass lesion are noted.  IMPRESSION: Mild chronic changes without acute abnormality.   Electronically Signed   By: Alcide Clever M.D.   On: 04/14/2013 18:53    EKG Interpretation    Date/Time:  Monday April 14 2013 16:44:59 EST Ventricular Rate:  83 PR Interval:  154 QRS Duration: 80 QT Interval:  377 QTC Calculation: 443 R Axis:   49 Text Interpretation:  Sinus rhythm LAE, consider biatrial enlargement No significant change since last tracing Confirmed by Jnyah Brazee  MD, Sederick Jacobsen ([id number]) on 04/14/2013 10:49:51 PM            MDM   1. Hypotension     Patient presented after hypotension and some altered mental status. Patient later became more alert. She states that she has had headache and left-sided her head on the right side her head. He will move back and  forth. She states she also has had some dizziness that was initially on the left side and then on to the right side. She was given a dose of IV Narcan with minimal improvement initially. She did improve gradually with time. Blood pressures been improved but still mildly low. Will be admitted to family practice and monitored    Juliet Rude. Rubin Payor, MD 04/14/13 2250

## 2013-04-14 NOTE — H&P (Signed)
Family Medicine Teaching [facility] Admission History and Physical Service Pager: [phone]  Patient name: Raven [name] Medical record number: [id number] Date of birth: [birth year] Age: 62 y.o. Gender: female  Primary Care Provider: Marena Chancy, MD Consultants: None Code Status: Full Code  Chief Complaint: Pre-syncope  Assessment and Plan: Raven [name] is a 62 y.o. female presenting with pre-syncopal episode and hypotension . PMH is significant for hypertension, hyperlipidemia, GERD  # Pre-syncope: Could be due to hypotension vs vasovagal vs medication related vs cardiac related. Patient has a history of hypertension and chronic pain and is on blood pressure medications vs pain medications that can cause hypotension. UDS was negative for opiates and positive for THC. At the time of pre-syncopal episode patient was minimally exertive but had ACS symptoms prior to event. Could have had vasovagal event from hypotension. Unsure why she was hypotension. Initial troponin negative. EKG shows no ST changes. Patient's hypotension improved. She has no irregular rhythms. Last A1C: 5.4 (02/03/13) and last LDL 139 (03/07/13).  Admit to telemetry unit for observation  Cardiac monitoring  Follow-up troponin x2  Follow-up Bmet, CBC and TSH in AM  Repeat EKG in AM  Start Aspirin 81mg  daily  Will hold naproxen  Will consider echo  # Hypotension: patient had a BP of 60/40 with EMS. Has been stable in systolic 100s-120s since arriving in the ED where she was s/p 500cc bolus of NS. Currently stable  Monitor blood pressures  Obtain orthostatic vitals  Will hold lisinopril-hctz, gabapentin and morphine  IVF - NS @ 100 mL  # Altered mental status - Patient somnolent upon arrival to the ED.    Patient AO x 3 and answering questions appropriately at the time of admission  Unclear etiology of AMS; Likely secondary to presynocope and hypotension.  Could be from sedation from pain  medications.   Will continue to monitor closely.   # Headache: history of left sided headache that move to her right.  Tylenol for headache  Monitor for improvement  # Chronic problems  Hypertension - will hold lisinopril-hctz as above  Chronic pain - continue flexeril, hold other meds as above; will order toradol prn for pain if patient asks for pain control. Will repeat UDS (patient is on chronic opiates)  Asthma - continue albuterol/flovent inhaler  Hyperlipidemia - continue statin (hospital formulary)  FEN/GI: heart healthy, MIVF: NS @100ml /hr Prophylaxis: lovenox  Disposition: Admit to telemetry for observation  History of Present Illness: Raven [name] is a 62 y.o. female presenting with pre-syncopal episode. Earlier today, she had gone to a Education officer, community and sports medicine appointment. After arriving at home, she had some 10/10 non-radiating pressure-like sub-sternal chest pain that occurred while she was preparing food for Thanksgiving. Her chest pain did not improve, she did not take anything for to alleviate her chest pain and she had associated shortness of breath. Shortly after her chest pain started, she had an episode of lightheadedness and almost fell, but caught herself. Her husband called EMS after the event. She has no nausea, no vomiting and no diarrhea and is currently not having chest pain or shortness of breath. She states that she took her medications today, which include gabapentin, naproxen and hydrochlorothiazide. She denies taking morphine.  Patient sleepy when arrived to ED. Given narcan with minimal initial improvement of mental status, but improved eventually. She received a 500cc bolus of normal saline.  Review Of Systems: Per HPI with the following additions:  Otherwise 12 point review of  systems was performed and was unremarkable.  Patient Active Problem List   Diagnosis Date Noted  . Left leg pain 01/03/2013  . GERD (gastroesophageal reflux disease)  01/03/2013  . Left hip pain 03/26/2012  . Left shoulder pain 12/01/2011  . Insomnia 08/05/2011  . Constipation due to pain medication 08/05/2011  . Tooth pain 01/06/2011  . SCIATICA, LEFT 03/23/2010  . ASTHMA, INTERMITTENT 11/23/2009  . HYPERLIPIDEMIA 03/12/2008  . Tobacco abuse counseling 09/02/2007  . HYPERTENSION, BENIGN SYSTEMIC 07/19/2006   Past Medical History: Past Medical History  Diagnosis Date  . Hypertension   . Spinal stenosis of lumbar region   . Asthma   . Tobacco abuse   . GERD (gastroesophageal reflux disease)   . Generalized headaches     ocasional, she uses naproxen.   Past Surgical History: Past Surgical History  Procedure Laterality Date  . Cesarean section      x2  . Tracheostomy      when she was 20   Social History: History  Substance Use Topics  . Smoking status: Current Every Day Smoker -- 0.30 packs/day    Types: Cigarettes  . Smokeless tobacco: Never Used     Comment: will quitt this year  . Alcohol Use: No   Additional social history:   Please also refer to relevant sections of EMR.  Family History: History reviewed. No pertinent family history. Allergies and Medications: Allergies  Allergen Reactions  . Procaine Hcl Hives and Rash   No current facility-administered medications on file prior to encounter.   Current Outpatient Prescriptions on File Prior to Encounter  Medication Sig Dispense Refill  . albuterol (PROVENTIL HFA;VENTOLIN HFA) 108 (90 BASE) MCG/ACT inhaler Inhale 1-2 puffs into the lungs every 4 (four) hours as needed. For wheezing  1 Inhaler  1  . Biotin 10 MG TABS Take 10 mg by mouth daily.       . Calcium-Magnesium-Vitamin D (CITRACAL CALCIUM+D) 600-40-500 MG-MG-UNIT TB24 Take 1 tablet by mouth 2 (two) times daily.       . cyclobenzaprine (FLEXERIL) 10 MG tablet Take 1 tablet (10 mg total) by mouth 3 (three) times daily as needed for muscle spasms.  90 tablet  1  . fluticasone (FLOVENT HFA) 110 MCG/ACT inhaler Inhale  1 puff into the lungs 2 (two) times daily.  1 Inhaler  12  . gabapentin (NEURONTIN) 400 MG capsule Take 1 capsule (400 mg total) by mouth 3 (three) times daily.  180 capsule  5  . gabapentin (NEURONTIN) 800 MG tablet Take 1 tablet (800 mg total) by mouth 3 (three) times daily.  180 tablet  6  . lisinopril-hydrochlorothiazide (PRINZIDE,ZESTORETIC) 20-25 MG per tablet Take 0.5 tablets by mouth daily. Take 1/2 tablet by mouth daily for high blood pressure.  90 tablet  3  . morphine (MSIR) 15 MG tablet Take 1 tablet (15 mg total) by mouth 3 (three) times daily as needed for severe pain.  90 tablet  0  . naproxen (NAPROSYN) 500 MG tablet Take 1 tablet (500 mg total) by mouth 2 (two) times daily with a meal.  60 tablet  4  . rosuvastatin (CRESTOR) 10 MG tablet Take 1 tablet (10 mg total) by mouth daily.  90 tablet  3    Objective: BP 102/47  Pulse 83  Temp(Src) 98.3 F (36.8 C) (Oral)  Resp 11  SpO2 100%  Exam: General: Laying in stretcher, in no acute distress, husband at bedside HEENT: PERRLA Cardiovascular: RRR, no murmurs, rubs or gallops  Respiratory: Clear to auscultation bilaterally, no wheezes Abdomen: Soft, epigastric tenderness, no rebound, no guarding Extremities: warm and well perfused Skin: no cyanosis Neuro: alert and oriented x3, no focal deficit, 5/5 strength throughout  Labs and Imaging: CBC BMET   Recent Labs Lab 04/14/13 1725  WBC 8.3  HGB 11.9*  HCT 34.0*  PLT 287    Recent Labs Lab 04/14/13 1725  NA 132*  K 4.2  CL 100  CO2 19  BUN 16  CREATININE 0.85  GLUCOSE 116*  CALCIUM 9.1     Urinalysis    Component Value Date/Time   COLORURINE YELLOW 04/14/2013 1942   APPEARANCEUR CLEAR 04/14/2013 1942   LABSPEC 1.012 04/14/2013 1942   PHURINE 6.0 04/14/2013 1942   GLUCOSEU NEGATIVE 04/14/2013 1942   HGBUR NEGATIVE 04/14/2013 1942   BILIRUBINUR NEGATIVE 04/14/2013 1942   KETONESUR NEGATIVE 04/14/2013 1942   PROTEINUR NEGATIVE 04/14/2013 1942    UROBILINOGEN 0.2 04/14/2013 1942   NITRITE NEGATIVE 04/14/2013 1942   LEUKOCYTESUR SMALL* 04/14/2013 1942   Drugs of Abuse     Component Value Date/Time   LABOPIA NONE DETECTED 04/14/2013 1942   COCAINSCRNUR NONE DETECTED 04/14/2013 1942   LABBENZ NONE DETECTED 04/14/2013 1942   AMPHETMU NONE DETECTED 04/14/2013 1942   THCU POSITIVE* 04/14/2013 1942   LABBARB NONE DETECTED 04/14/2013 1942     EKG (11/24) Ventricular Rate: 83  PR Interval: 154  QRS Duration: 80  QT Interval: 377  QTC Calculation: 443  R Axis: 49  Text Interpretation: Sinus rhythm LAE, consider biatrial enlargement No significant change since last tracing Confirmed by PICKERING MD, NATHAN ([id number]) on 04/14/2013 10:49:51 PM  CT Head(11/24) FINDINGS:  The bony calvarium is intact. No gross soft tissue abnormality is  noted. Opacification is seen within the ethmoid sinuses and left  maxillary sinus. This appears chronic in nature. The ventricles are  again mildly prominent. Mild atrophic changes are seen. No findings  to suggest acute hemorrhage, acute infarction or space-occupying  mass lesion are noted.  IMPRESSION:  Mild chronic changes without acute abnormality.  Jacquelin Hawking, MD 04/14/2013, 9:55 PM PGY-1, [place] [facility] FPTS Intern pager: [phone], text pages welcome    I have seen the patient and agree with the above note.  My addendum notes are in blue.  Everlene Other DO Family Medicine PGY-2

## 2013-04-14 NOTE — Progress Notes (Signed)
Patient ID: Raven [name], female   DOB: 07/23/1950, 62 y.o.   MRN: [id number]  Consultation Referral: Marena Chancy, MD  SUBJECTIVE:  Patient is a 62 yo AAF PMHx significant for chronic low back pain with continuous opioid dependences, HTN, HLD. She presents today by referral from PCP because poor pain control of her low back pain with know pathology seen on previous MRI. Patient reports having and MRI in 2011 and 2012. The reports from 2012 revealed L2-L3, L3-L4 mild disc bulge, L4-L5 moderate disc bulge with mild stenosis. L5-S1 chronic very severe facet hypertrophy and worse on the left with moderate circumferential disc and pseudo disc protrusion as the likely source of her L5 radiculitis. Patient has been managing her pain and radiopathy symptoms with Flexeril 10mg  PRN, Gabapentin 1200 mg TID, Naproxen 500mg  BID and Morphine 15 mg TID. She reports some pain control on this regiment but is interested in a more aggressive approach. She has not done any physical therapy or seen a spine surgeon in the past. She describes the pain as a general constant soreness across the back with intermittent back spasm and pain radiating down her left buttock and lateral left thigh down to her knee and some time the top of her foot. She denies any numbness, no tingling, no urinary or bowel incontinence.   OBJECTIVE: BP 126/81 General: Alert and oriented and in NAD. Respiratory:Normal respiratory effort without use of accessory muscles. Skin: Warm, dry, intact.  No rashes, lesions, ecchymosis, erythema. Vascular: Radial pulses 2+ bilaterally.  No peripheral edema. Psych: Normal mood and affect.  Pleasant and cooperative. Neuro: A&O x3. Sensation to light touch upper extremities equal and intact bilaterally. Normal Heel and toe gait.  Normal gait. Low back exam: Observation: Normal curvature and no kyphosis or lordosis, no scoliosis.  Iliac crests are symmetric, shoulders line symmetrically Palpation: No step  off defects noted in the thoracic or lumbar spine.  Mild TTP and muscle spasm  along the paraspinal musculature lumbar spine left >right. Range of motion: Normal flexion/ extension at the lumbar spine. Mild pain with one leg hyperextension (stork test). Pain with straight leg raise left normal strength, radiate of pain with leg flexion and lumbar flexion but no numbness or tingling noted. Normal +1 reflexes equal bilateral. Normal strength L2-S1 motor testing 5/5 with hip flexion (L2), knee extension (L3),  dorsi flexion  (L4), great toe flexion (L5), ankle plantar flexion (S1).  normal gait walking with walking on heels or walking on toes, normal tandem gait.  Left hip: Smooth painless hip range of motion. Minimal tenderness to palpation over the greater trochanteric bursa.   ASSESSMENT: 1. Lumbar DJD 2. Mild to moderate disc bulge of the lumbar spine 3. 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

## 2013-04-14 NOTE — ED Notes (Signed)
Pt presents to ED via EMS with c/o sudden onset of dizziness, hypotension, and chest pain (chest pain onset yesterday). Pt EMS, initial BP-60/40 sitting, BP-100/60 lying, CBG-140, EKG unremarkable. Pt states she visited sport med today for dist hernia and vitals were normal.

## 2013-04-15 ENCOUNTER — Other Ambulatory Visit: Payer: Self-pay | Admitting: Physician Assistant

## 2013-04-15 DIAGNOSIS — M25559 Pain in unspecified hip: Secondary | ICD-10-CM

## 2013-04-15 DIAGNOSIS — J45909 Unspecified asthma, uncomplicated: Secondary | ICD-10-CM

## 2013-04-15 DIAGNOSIS — K219 Gastro-esophageal reflux disease without esophagitis: Secondary | ICD-10-CM

## 2013-04-15 DIAGNOSIS — R55 Syncope and collapse: Secondary | ICD-10-CM

## 2013-04-15 DIAGNOSIS — I10 Essential (primary) hypertension: Secondary | ICD-10-CM

## 2013-04-15 LAB — RAPID URINE DRUG SCREEN, HOSP PERFORMED
Amphetamines: NOT DETECTED
Benzodiazepines: NOT DETECTED
Opiates: NOT DETECTED
Tetrahydrocannabinol: POSITIVE — AB

## 2013-04-15 LAB — CREATININE, SERUM: Creatinine, Ser: 0.87 mg/dL (ref 0.50–1.10)

## 2013-04-15 MED ORDER — CALCIUM-MAGNESIUM-VITAMIN D ER 600-40-500 MG-MG-UNIT PO TB24: 1.0000 | ORAL_TABLET | Freq: Two times a day (BID) | ORAL | Status: DC

## 2013-04-15 MED ORDER — ROSUVASTATIN CALCIUM 10 MG PO TABS: 10.0000 mg | ORAL_TABLET | Freq: Every day | ORAL | Status: DC

## 2013-04-15 MED ORDER — CALCIUM CARBONATE-VITAMIN D 500-200 MG-UNIT PO TABS
1.0000 | ORAL_TABLET | Freq: Two times a day (BID) | ORAL | Status: DC
  Administered 2013-04-15: 12:00:00 1 via ORAL
  Filled 2013-04-15 (×2): qty 1

## 2013-04-15 MED ORDER — ASPIRIN 81 MG PO TBEC: 81.0000 mg | DELAYED_RELEASE_TABLET | Freq: Every day | ORAL | Status: DC

## 2013-04-15 MED ORDER — PNEUMOCOCCAL VAC POLYVALENT 25 MCG/0.5ML IJ INJ: 0.5000 mL | INJECTION | INTRAMUSCULAR | Status: DC

## 2013-04-15 NOTE — Progress Notes (Signed)
Pt O4x, no complaints of CP sob or dizziness. Pt states she is ready to go home. Will continue to monitor

## 2013-04-15 NOTE — H&P (Signed)
FMTS Attending Admission Note: Renold Don MD Personal pager:  [phone] FPTS Service Pager:  [phone]  I  have seen and examined this patient, reviewed their chart. I have discussed this patient with the resident. I agree with the resident's findings, assessment and care plan.  Additionally:  Briefly, 62 yo M with HTN, HLD, GERD who presents with pre-sycnope and hypotension.  Patient at home yesterday in small apartment with oven and clothes dryer running.  Took BP meds (inconsistent usage) yesterday AM as she was going to doctor appt, did not eat lunch, did not drink much during the day.  In PM noted "spots in front of my eyes," called EMS as she was lightheaded.  No fall, no LOC.  Noted hypotension, brought to ED, unclear exact numbers.  Admitted for observation to FPTS.  Exam: Gen:  Patient sitting on side of bed, NAD, pleasant, awake and alert Heart:  RRR.   Lungs:  Clear throughout.  Abdomen:  Soft/NT Ext:  No edema  Imp/Plan; 1.  Pre-syncope: - No actual fall/syncope.  No palpitations - LIkely secondary to dehydration and resultant hypotension - No events on tele overnight.  - Also possibly medication related as not usually takes her BP meds.   - Can likely do Echo on outpt basis - Liklye DC home this PM if continues to do well  2.  Hypotension: - MOst likely etiology of #1 above. - AGree with repletion.    3.  AMS: - completely resolved today.  Dehydration vs narcotic overuse.     Tobey Grim, MD 04/15/2013 10:02 AM

## 2013-04-15 NOTE — Progress Notes (Signed)
Pt states feeling dizzy and having head ache after getting up from toilet

## 2013-04-15 NOTE — Progress Notes (Signed)
UR completed 

## 2013-04-15 NOTE — Progress Notes (Signed)
Pt d/c home with husband. D/c instructions and medications reviewed with Pt. Pt states understanding. Pt anxious to go home. All Pt questions answered.

## 2013-04-15 NOTE — Discharge Summary (Signed)
Family Medicine Teaching [facility] Discharge Summary  Patient name: Raven [name] Medical record number: [id number] Date of birth: Sep 25, 1950 Age: 62 y.o. Gender: female Date of Admission: 04/14/2013  Date of Discharge: 04/15/2013 Admitting Physician: Tobey Grim, MD  Primary Care Provider: Marena Chancy, MD Consultants: None  Indication for Hospitalization: Pre-syncope  Discharge Diagnoses/Problem List:  1. Pre-syncope 2. Chest pain 3. Hypotension 4. Altered mental status 5. Headache 6. Hypertension 7. Chronic pain 8. Asthma 9. Hyperlipidemia  Disposition: Discharge home  Discharge Condition: Stable  Discharge Exam:  General: Standing up by her bed, in no acute distress  Cardiovascular: RRR, no murmurs  Respiratory: Clear to auscultation bilaterally, no wheezes or rales  Abdomen: Soft, non-tender, non-distended  Extremities: No edema  Brief Hospital Course: Raven [name] is a 62 y.o. female presenting with pre-syncopal episode. Earlier today, she had gone to a Education officer, community and sports medicine appointment. After arriving at home, she had some 10/10 non-radiating pressure-like sub-sternal chest pain that occurred while she was preparing food for Thanksgiving. Her chest pain did not improve, she did not take anything for to alleviate her chest pain and she had associated shortness of breath. Shortly after her chest pain started, she had an episode of lightheadedness and almost fell, but caught herself. Her husband called EMS after the event. She has no nausea, no vomiting and no diarrhea and is currently not having chest pain or shortness of breath. She states that she took her medications today, which include gabapentin, naproxen and hydrochlorothiazide. She denies taking morphine.  Patient sleepy when arrived to ED. Given narcan with minimal initial improvement of mental status, but improved eventually. She received a 500cc bolus of normal saline.  1. Pre-syncope: ACS  workup was essentially negative. Troponin was negative x2, EKG suggestive of bilateral atrial enlargement but no ST or T wave changes. No episodes of chest pain or shortness of breath. Home naproxen, flexeril, lisinopril-hctz and morphine held for possible contribution. Aspirin started. While admitted, patient had no recurrent pre-syncopal episodes and sent home with follow-up information for an outpatient echocardiogram and stress test. 2. Chest pain: no symptoms while admitted. Workup as above. 3. Hypotension: Patient had episode of hypotension before arrival to the hospital. While admitted, she remained relatively normotensive. As stated above, lisinopril-hctz was held while admitted. 4. Altered mental status: Patient initially seemed altered in the ED. UDS was positive for THC and negative for opiates. AMS may have been caused by THC. Patient remained alert and oriented. 5. Headache: Patient had a history of frontal headache prior to admission. No complaints of headache while admitted. She had tylenol ordered as needed 6. Hypertension: Discontinued hctz-lisinopril while inpatient. Restarted at discharge 7. Chronic pain: discontinued flexeril, morphine and gabapentin 8. Asthma: patient continued on albuterol/flovent 9. Hyperlipidemia: patient continued on statin  Issues for Follow Up:  1. Follow-up outpatient echocardiogram (04/16/2013) 2. Follow-up outpatient nuclear stress test (04/28/2013) 3. Readjust pain medication for concern that something may be causing hypotension. 4. May consider obtaining TSH as patient refused blood draw for TSH inpatient  Significant Procedures: None  Significant Labs and Imaging:   Recent Labs Lab 04/14/13 1725 04/15/13 0110  WBC 8.3 <0.1* (not enough sample)  HGB 11.9* 12.7  HCT 34.0* 35.8*  PLT 287 245    Recent Labs Lab 04/14/13 1725 04/15/13 0110  NA 132*  --   K 4.2  --   CL 100  --   CO2 19  --   GLUCOSE 116*  --  BUN 16  --   CREATININE  0.85 0.87  CALCIUM 9.1  --   ALKPHOS 97  --   AST 30  --   ALT 14  --   ALBUMIN 3.4*  --     Recent Labs Lab 04/14/13 1726  TROPONINI <0.30   TSH  Date Value Range Status  04/16/2013 0.386  0.350 - 4.500 uIU/mL Final   Drugs of Abuse     Component Value Date/Time   LABOPIA NONE DETECTED 04/14/2013 2359   COCAINSCRNUR NONE DETECTED 04/14/2013 2359   LABBENZ NONE DETECTED 04/14/2013 2359   AMPHETMU NONE DETECTED 04/14/2013 2359   THCU POSITIVE* 04/14/2013 2359   LABBARB NONE DETECTED 04/14/2013 2359     Results/Tests Pending at Time of Discharge: None  Discharge Medications:    Medication List    STOP taking these medications       gabapentin 400 MG capsule  Commonly known as:  NEURONTIN     gabapentin 800 MG tablet  Commonly known as:  NEURONTIN     lisinopril-hydrochlorothiazide 20-25 MG per tablet  Commonly known as:  PRINZIDE,ZESTORETIC     morphine 15 MG tablet  Commonly known as:  MSIR     rosuvastatin 10 MG tablet  Commonly known as:  CRESTOR      TAKE these medications       albuterol 108 (90 BASE) MCG/ACT inhaler  Commonly known as:  PROVENTIL HFA;VENTOLIN HFA  Inhale 1-2 puffs into the lungs every 4 (four) hours as needed. For wheezing     aspirin 81 MG EC tablet  Take 1 tablet (81 mg total) by mouth daily.     Biotin 10 MG Tabs  Take 10 mg by mouth daily.     CITRACAL CALCIUM+D 600-40-500 MG-MG-UNIT Tb24  Generic drug:  Calcium-Magnesium-Vitamin D  Take 1 tablet by mouth 2 (two) times daily.     cyclobenzaprine 10 MG tablet  Commonly known as:  FLEXERIL  Take 1 tablet (10 mg total) by mouth 3 (three) times daily as needed for muscle spasms.     fluticasone 110 MCG/ACT inhaler  Commonly known as:  FLOVENT HFA  Inhale 1 puff into the lungs 2 (two) times daily.     naproxen 500 MG tablet  Commonly known as:  NAPROSYN  Take 1 tablet (500 mg total) by mouth 2 (two) times daily with a meal.        Discharge Instructions: Please  refer to Patient Instructions section of EMR for full details.  Patient was counseled important signs and symptoms that should prompt return to medical care, changes in medications, dietary instructions, activity restrictions, and follow up appointments.   Follow-Up Appointments: Follow-up Information   Follow up with Marena Chancy, MD On 04/21/2013. (at 11:15AM)    Specialty:  Family Medicine   Contact information:   [address] [place] [address] [phone]       Follow up with [facility]. (Heart Ultrasound tomorow 04/16/13 at 10:30am)    Specialty:  Cardiology   Contact information:   [address], [address] [place] [address] [phone]      Follow up with [facility] (Exercise nuclear stress test 04/28/13 at 9:45am - see last page of AVS for instructions)    Specialty:  Cardiology   Contact information:   [address], [address] [place] [address] [phone]      Jacquelin Hawking, MD 04/17/2013, 7:22 AM PGY-1,  [place]

## 2013-04-15 NOTE — Progress Notes (Signed)
FMTS Attending Note  The plan of care was discussed with the resident team. I agree with the assessment and plan as documented by the resident.   Fionnuala Hemmerich MD 

## 2013-04-15 NOTE — Progress Notes (Signed)
PHARMACIST - PHYSICIAN ORDER COMMUNICATION  CONCERNING: P&T Medication Policy on Herbal Medications  DESCRIPTION:  This patient's order for:  Biotin 10mg    has been noted.  This product(s) is classified as an "herbal" or natural product. Due to a lack of definitive safety studies or FDA approval, nonstandard manufacturing practices, plus the potential risk of unknown drug-drug interactions while on inpatient medications, the Pharmacy and Therapeutics Committee does not permit the use of "herbal" or natural products of this type within [facility].   ACTION TAKEN: The pharmacy department is unable to verify this order at this time and your patient has been informed of this safety policy. Please reevaluate patient's clinical condition at discharge and address if the herbal or natural product(s) should be resumed at that time.

## 2013-04-15 NOTE — Progress Notes (Signed)
Family Medicine Teaching Service Daily Progress Note Intern Pager: [phone]  Patient name: Raven [name] Medical record number: [id number] Date of birth: [birth year] Age: 62 y.o. Gender: female  Primary Care Provider: Marena Chancy, MD Consultants: None Code Status: Full Code  Pt Overview and Major Events to Date:   Assessment and Plan: Raven [name] is a 62 y.o. female presenting with pre-syncopal episode and hypotension . PMH is significant for hypertension, hyperlipidemia, GERD   # Pre-syncope: Could be due to hypotension vs vasovagal vs medication related vs cardiac related. Troponin negative x2.  Continue cardiac monitoring  Follow-up troponin x2  Follow-up Bmet, CBC and TSH in AM Repeat EKG in AM  Continue Aspirin 81mg  daily  Will hold naproxen  Outpatient stress test/echo  # Hypotension: patient had a BP of 60/40 with EMS. Has been stable in systolic 100s-120s since arriving in the ED where she was s/p 500cc bolus of NS. Orthostatic vitals were negative. Currently stable with BPs in 120-100/70-30 range Monitor blood pressures  Continue to hold lisinopril-hctz, gabapentin and morphine  IVF - NS @ 100 mL  # Altered mental status - Patient somnolent upon arrival to the ED. Patient AO x 3 and answering questions appropriately at the time of admission. Alert and oriented today. Will continue to monitor closely.   # Headache: history of left sided headache that move to her right. Not having a headache. Tylenol for headache   # Chronic problems  Hypertension - will hold lisinopril-hctz as above  Chronic pain - continue flexeril, hold other meds as above; will order toradol prn for pain if patient asks for pain control. Repeat UDS negative Asthma - continue albuterol/flovent inhaler  Hyperlipidemia - continue statin (hospital formulary) FEN/GI: heart healthy, MIVF: NS @100ml /hr Prophylaxis: lovenox  Disposition: probable discharge home today as patient is pleasantly  adamant about not staying in the hospital and has improved symptoms. Can follow-up outpatient  Subjective: Patient reports no problems overnight. Has been ambulating to the bathroom with no lightheadedness. Has not had any chest pain or shortness of breath.  Objective: Temp:  [97.8 F (36.6 C)-98.5 F (36.9 C)] 97.8 F (36.6 C) (11/24 2358) Pulse Rate:  [72-96] 79 (11/24 2230) Resp:  [11-20] 16 (11/24 2345) BP: (95-126)/(30-81) 112/68 mmHg (11/25 0000) SpO2:  [96 %-100 %] 100 % (11/24 2345) Weight:  [160 lb (72.576 kg)-162 lb 7.7 oz (73.7 kg)] 162 lb 7.7 oz (73.7 kg) (11/24 2345)  Physical Exam: General: Standing up by her bed, in no acute distress Cardiovascular: RRR, no murmurs Respiratory: Clear to auscultation bilaterally, no wheezes or rales Abdomen: Soft, non-tender, non-distended Extremities: No edema  Laboratory:  Recent Labs Lab 04/14/13 1725 04/15/13 0110  WBC 8.3 <0.1*  HGB 11.9* 12.7  HCT 34.0* 35.8*  PLT 287 245    Recent Labs Lab 04/14/13 1725 04/15/13 0110  NA 132*  --   K 4.2  --   CL 100  --   CO2 19  --   BUN 16  --   CREATININE 0.85 0.87  CALCIUM 9.1  --   PROT 7.5  --   BILITOT 0.1*  --   ALKPHOS 97  --   ALT 14  --   AST 30  --   GLUCOSE 116*  --     Cardiac Panel (last 3 results)  Recent Labs  04/14/13 0220 04/14/13 1726  TROPONINI <0.30 <0.30   Imaging/Diagnostic Tests:  No recent imaging  Jacquelin Hawking, MD 04/15/2013, 4:11 AM PGY-1,  [facility] Intern pager: [phone], text pages welcome

## 2013-04-15 NOTE — Progress Notes (Signed)
Received request by [facility] MD to schedule echo and exercise nuclear stress test for syncope. Results will need to go to PCP Dr. Marena Chancy.   Echo - 04/16/13 at 10:30am Nuc - 04/28/13 at 9:45am  Gurpreet Mariani PA-C

## 2013-04-16 ENCOUNTER — Encounter: Payer: Self-pay | Admitting: Family Medicine

## 2013-04-16 ENCOUNTER — Other Ambulatory Visit (HOSPITAL_COMMUNITY): Payer: Medicare HMO

## 2013-04-16 ENCOUNTER — Ambulatory Visit (INDEPENDENT_AMBULATORY_CARE_PROVIDER_SITE_OTHER): Payer: Medicare HMO | Admitting: Family Medicine

## 2013-04-16 ENCOUNTER — Telehealth: Payer: Self-pay | Admitting: Family Medicine

## 2013-04-16 VITALS — BP 133/81 | HR 80 | Wt 162.0 lb

## 2013-04-16 DIAGNOSIS — M5430 Sciatica, unspecified side: Secondary | ICD-10-CM

## 2013-04-16 DIAGNOSIS — E785 Hyperlipidemia, unspecified: Secondary | ICD-10-CM

## 2013-04-16 DIAGNOSIS — R55 Syncope and collapse: Secondary | ICD-10-CM | POA: Insufficient documentation

## 2013-04-16 DIAGNOSIS — M5432 Sciatica, left side: Secondary | ICD-10-CM

## 2013-04-16 LAB — CBC
HCT: 35.8 % — ABNORMAL LOW (ref 36.0–46.0)
Hemoglobin: 12.7 g/dL (ref 12.0–15.0)
MCH: 32.9 pg (ref 26.0–34.0)
MCHC: 35.5 g/dL (ref 30.0–36.0)
MCV: 92.7 fL (ref 78.0–100.0)
Platelets: 245 10*3/uL (ref 150–400)
RBC: 3.86 MIL/uL — ABNORMAL LOW (ref 3.87–5.11)
RDW: 13.5 % (ref 11.5–15.5)
WBC: <0.1 10*3/uL — CL (ref 4.0–10.5)

## 2013-04-16 MED ORDER — MORPHINE SULFATE 15 MG PO TABS: 15.0000 mg | ORAL_TABLET | Freq: Two times a day (BID) | ORAL | Status: DC | PRN

## 2013-04-16 MED ORDER — GABAPENTIN 800 MG PO TABS: 800.0000 mg | ORAL_TABLET | Freq: Three times a day (TID) | ORAL | Status: DC

## 2013-04-16 MED ORDER — ROSUVASTATIN CALCIUM 10 MG PO TABS: 10.0000 mg | ORAL_TABLET | Freq: Every day | ORAL | Status: DC

## 2013-04-16 MED ORDER — GABAPENTIN 400 MG PO CAPS: 400.0000 mg | ORAL_CAPSULE | Freq: Three times a day (TID) | ORAL | Status: DC

## 2013-04-16 NOTE — Progress Notes (Signed)
Patient ID: RONAE NOELL    DOB: 10/05/1950, 62 y.o.   MRN: [id number] --- Subjective:  Trinadee is a 62 y.o.female who presents for follow up on hospitalization from 04/14/13.  - Recent hospitalization with pre-syncopal event: patient at that day of the event, she started feeling very warm. She felt like the kitchen was very warm. She then started feeling sweaty, she saw spots in front of her vision and progressively darkening vision. She was sitting cutting up vegetables for Thanksgiving. She stopped doing what she was doing, tried to get up but her leg went out and she wasn't able to support her weight. She then went to the bedroom and fell without hitting her head. She did not lose consciousness. She tells me that she had eaten an early breakfast and for lunch had a bag of chips. She had not been drinking any water throughout the day. When she was brought to the emergency room her blood pressure was low at 60 systolic. When I ask her about the urine results positive for marijuana, she is very surprised about it and says that she does not smoke. She does say that her son smokes marijuana in the house and that is her only exposure to it. She also says that she had not taken her morphine that day. She had taking it the day before. She had taken her in gabapentin as normal, her naproxen, her Flexeril. She was discharged yesterday from the hospital with followup for a stress test. She is back in her usual state of health. This morning she took her gabapentin, her blood pressure medicine, her naproxen. She did not take her morphine this morning and did not take her Flexeril  ROS: see HPI Past Medical History: reviewed and updated medications and allergies. Social History: Tobacco: Current smoker  Objective: Filed Vitals:   04/16/13 0921  BP: 133/81  Pulse: 80    Physical Examination:   General appearance - alert, well appearing, and in no distress Chest - clear to auscultation, no wheezes, rales  or rhonchi, symmetric air entry Heart - normal rate, regular rhythm, normal S1, S2, no murmurs

## 2013-04-16 NOTE — Telephone Encounter (Signed)
Refilled crestor and sent it to the [facility] pharmacy  Marena Chancy, PGY-3 Family Medicine Resident

## 2013-04-16 NOTE — Assessment & Plan Note (Addendum)
Admitted with presyncope. Unclear etiology, but vasovagal in setting of low blood pressure appears very likely. Patient to follow up with cardiology for stress test. Continue holding blood pressure medicine. Patient is to followup with me in a couple weeks to recheck blood pressure off of the medicine. Continue gabapentin with precautions of stopping it in case she has recurrence of symptoms. Will reduce the morphine to twice a day. The patient is in agreement with this.  Also talked about adequate hydration and frequent meals during the day.

## 2013-04-16 NOTE — Telephone Encounter (Signed)
Pt called because she was seen today by Dr. Gwenlyn Saran and she unsure if she is supposed to continue with her cholesterol medication and if so can we send a refill to [facility]. jw

## 2013-04-16 NOTE — Assessment & Plan Note (Signed)
Seen at sports medicine by Dr. Margaretha Sheffield, who recommended epidural injection. Patient is looking forward to another modality of treatment. Discussed positive marijuana on urine drug screen. She adamantly states that she does not smoke at and said that she could have been exposed by her son smoking in the house. I will give her the benefit of the doubt. She is in agreement to decreasing her morphine to twice a day from 3 times a day. Her goal is to be off of the morphine.

## 2013-04-16 NOTE — Patient Instructions (Signed)
For the morphine, let's reduce it to twice a day.   For the blood pressure medicine, stop it. Take your blood pressure every day and bring me the log back at your next appointment.   You can continue the gabapentin, but if you have another episode, stop it.   Vasovagal Syncope, Adult Syncope, commonly known as fainting, is a temporary loss of consciousness. It occurs when the blood flow to the brain is reduced. Vasovagal syncope (also called neurocardiogenic syncope) is a fainting spell in which the blood flow to the brain is reduced because of a sudden drop in heart rate and blood pressure. Vasovagal syncope occurs when the brain and the cardiovascular system (blood vessels) do not adequately communicate and respond to each other. This is the most common cause of fainting. It often occurs in response to fear or some other type of emotional or physical stress. The body has a reaction in which the heart starts beating too slowly or the blood vessels expand, reducing blood pressure. This type of fainting spell is generally considered harmless. However, injuries can occur if a person takes a sudden fall during a fainting spell.  CAUSES  Vasovagal syncope occurs when a person's blood pressure and heart rate decrease suddenly, usually in response to a trigger. Many things and situations can trigger an episode. Some of these include:   Pain.   Fear.   The sight of blood or medical procedures, such as blood being drawn from a vein.   Common activities, such as coughing, swallowing, stretching, or going to the bathroom.   Emotional stress.   Prolonged standing, especially in a warm environment.   Lack of sleep or rest.   Prolonged lack of food.   Prolonged lack of fluids.   Recent illness.  The use of certain drugs that affect blood pressure, such as cocaine, alcohol, marijuana, inhalants, and opiates.  SYMPTOMS  Before the fainting episode, you may:   Feel dizzy or light headed.    Become pale.  Sense that you are going to faint.   Feel like the room is spinning.   Have tunnel vision, only seeing directly in front of you.   Feel sick to your stomach (nauseous).   See spots or slowly lose vision.   Hear ringing in your ears.   Have a headache.   Feel warm and sweaty.   Feel a sensation of pins and needles. During the fainting spell, you will generally be unconscious for no longer than a couple minutes before waking up and returning to normal. If you get up too quickly before your body can recover, you may faint again. Some twitching or jerky movements may occur during the fainting spell.  DIAGNOSIS  Your caregiver will ask about your symptoms, take a medical history, and perform a physical exam. Various tests may be done to rule out other causes of fainting. These may include blood tests and tests to check the heart, such as electrocardiography, echocardiography, and possibly an electrophysiology study. When other causes have been ruled out, a test may be done to check the body's response to changes in position (tilt table test). TREATMENT  Most cases of vasovagal syncope do not require treatment. Your caregiver may recommend ways to avoid fainting triggers and may provide home strategies for preventing fainting. If you must be exposed to a possible trigger, you can drink additional fluids to help reduce your chances of having an episode of vasovagal syncope. If you have warning signs of an  oncoming episode, you can respond by positioning yourself favorably (lying down). If your fainting spells continue, you may be given medicines to prevent fainting. Some medicines may help make you more resistant to repeated episodes of vasovagal syncope. Special exercises or compression stockings may be recommended. In rare cases, the surgical placement of a pacemaker is considered. HOME CARE INSTRUCTIONS   Learn to identify the warning signs of vasovagal syncope.    Sit or lie down at the first warning sign of a fainting spell. If sitting, put your head down between your legs. If you lie down, swing your legs up in the air to increase blood flow to the brain.   Avoid hot tubs and saunas.  Avoid prolonged standing.  Drink enough fluids to keep your urine clear or pale yellow. Avoid caffeine.  Increase salt in your diet as directed by your caregiver.   If you have to stand for a long time, perform movements such as:   Crossing your legs.   Flexing and stretching your leg muscles.   Squatting.   Moving your legs.   Bending over.   Only take over-the-counter or prescription medicines as directed by your caregiver. Do not suddenly stop any medicines without asking your caregiver first. SEEK MEDICAL CARE IF:   Your fainting spells continue or happen more frequently in spite of treatment.   You lose consciousness for more than a couple minutes.  You have fainting spells during or after exercising or after being startled.   You have new symptoms that occur with the fainting spells, such as:   Shortness of breath.  Chest pain.   Irregular heartbeat.   You have episodes of twitching or jerky movements that last longer than a few seconds.  You have episodes of twitching or jerky movements without obvious fainting. SEEK IMMEDIATE MEDICAL CARE IF:   You have injuries or bleeding after a fainting spell.   You have episodes of twitching or jerky movements that last longer than 5 minutes.   You have more than one spell of twitching or jerky movements before returning to consciousness after fainting. MAKE SURE YOU:   Understand these instructions.  Will watch your condition.  Will get help right away if you are not doing well or get worse. Document Released: 04/24/2012 Document Reviewed: 04/24/2012 [facility] Patient Information 2014 [place], [place].

## 2013-04-17 LAB — TSH: TSH: 0.386 u[IU]/mL (ref 0.350–4.500)

## 2013-04-18 NOTE — Discharge Summary (Signed)
I agree with the discharge summary as documented.   Nasira Janusz MD  

## 2013-04-21 ENCOUNTER — Ambulatory Visit: Payer: Medicare HMO | Admitting: Family Medicine

## 2013-04-23 ENCOUNTER — Telehealth: Payer: Self-pay | Admitting: Family Medicine

## 2013-04-23 DIAGNOSIS — E785 Hyperlipidemia, unspecified: Secondary | ICD-10-CM

## 2013-04-23 NOTE — Telephone Encounter (Signed)
Pt called because Crestor is 140.00 and she can not afford this. Is there another medication that would do the same thing cheaper so can call in. jw

## 2013-04-23 NOTE — Telephone Encounter (Signed)
Forward to PCP for cheaper Rx.Busick, [place]  

## 2013-04-24 MED ORDER — ATORVASTATIN CALCIUM 20 MG PO TABS: 20.0000 mg | ORAL_TABLET | Freq: Every day | ORAL | Status: DC

## 2013-04-24 NOTE — Telephone Encounter (Signed)
Called patient to let her know lipitor was sent to pharmacy

## 2013-04-24 NOTE — Telephone Encounter (Signed)
Will switch to lipitor 20mg  daily and see if this is less expensive. Please let patient know.  Thank you!  Marena Chancy, PGY-3 Family Medicine Resident

## 2013-04-28 ENCOUNTER — Encounter (HOSPITAL_COMMUNITY): Payer: Medicare HMO

## 2013-04-30 ENCOUNTER — Ambulatory Visit (INDEPENDENT_AMBULATORY_CARE_PROVIDER_SITE_OTHER): Payer: Medicare HMO | Admitting: Family Medicine

## 2013-04-30 ENCOUNTER — Encounter: Payer: Self-pay | Admitting: Family Medicine

## 2013-04-30 VITALS — BP 150/80 | HR 100 | Ht 62.0 in | Wt 164.8 lb

## 2013-04-30 DIAGNOSIS — G4700 Insomnia, unspecified: Secondary | ICD-10-CM

## 2013-04-30 DIAGNOSIS — M5432 Sciatica, left side: Secondary | ICD-10-CM

## 2013-04-30 DIAGNOSIS — M5430 Sciatica, unspecified side: Secondary | ICD-10-CM

## 2013-04-30 DIAGNOSIS — I10 Essential (primary) hypertension: Secondary | ICD-10-CM

## 2013-04-30 MED ORDER — MORPHINE SULFATE 15 MG PO TABS: 15.0000 mg | ORAL_TABLET | Freq: Three times a day (TID) | ORAL | Status: DC

## 2013-04-30 MED ORDER — CYCLOBENZAPRINE HCL 10 MG PO TABS: 10.0000 mg | ORAL_TABLET | Freq: Three times a day (TID) | ORAL | Status: DC | PRN

## 2013-04-30 NOTE — Patient Instructions (Signed)
4 the exercise, start walking 30 minutes every day.  Insomnia Insomnia is frequent trouble falling and/or staying asleep. Insomnia can be a long term problem or a short term problem. Both are common. Insomnia can be a short term problem when the wakefulness is related to a certain stress or worry. Long term insomnia is often related to ongoing stress during waking hours and/or poor sleeping habits. Overtime, sleep deprivation itself can make the problem worse. Every little thing feels more severe because you are overtired and your ability to cope is decreased. CAUSES   Stress, anxiety, and depression.  Poor sleeping habits.  Distractions such as TV in the bedroom.  Naps close to bedtime.  Engaging in emotionally charged conversations before bed.  Technical reading before sleep.  Alcohol and other sedatives. They may make the problem worse. They can hurt normal sleep patterns and normal dream activity.  Stimulants such as caffeine for several hours prior to bedtime.  Pain syndromes and shortness of breath can cause insomnia.  Exercise late at night.  Changing time zones may cause sleeping problems (jet lag). It is sometimes helpful to have someone observe your sleeping patterns. They should look for periods of not breathing during the night (sleep apnea). They should also look to see how long those periods last. If you live alone or observers are uncertain, you can also be observed at a sleep clinic where your sleep patterns will be professionally monitored. Sleep apnea requires a checkup and treatment. Give your caregivers your medical history. Give your caregivers observations your family has made about your sleep.  SYMPTOMS   Not feeling rested in the morning.  Anxiety and restlessness at bedtime.  Difficulty falling and staying asleep. TREATMENT   Your caregiver may prescribe treatment for an underlying medical disorders. Your caregiver can give advice or help if you are using  alcohol or other drugs for self-medication. Treatment of underlying problems will usually eliminate insomnia problems.  Medications can be prescribed for short time use. They are generally not recommended for lengthy use.  Over-the-counter sleep medicines are not recommended for lengthy use. They can be habit forming.  You can promote easier sleeping by making lifestyle changes such as:  Using relaxation techniques that help with breathing and reduce muscle tension.  Exercising earlier in the day.  Changing your diet and the time of your last meal. No night time snacks.  Establish a regular time to go to bed.  Counseling can help with stressful problems and worry.  Soothing music and white noise may be helpful if there are background noises you cannot remove.  Stop tedious detailed work at least one hour before bedtime. HOME CARE INSTRUCTIONS   Keep a diary. Inform your caregiver about your progress. This includes any medication side effects. See your caregiver regularly. Take note of:  Times when you are asleep.  Times when you are awake during the night.  The quality of your sleep.  How you feel the next day. This information will help your caregiver care for you.  Get out of bed if you are still awake after 15 minutes. Read or do some quiet activity. Keep the lights down. Wait until you feel sleepy and go back to bed.  Keep regular sleeping and waking hours. Avoid naps.  Exercise regularly.  Avoid distractions at bedtime. Distractions include watching television or engaging in any intense or detailed activity like attempting to balance the household checkbook.  Develop a bedtime ritual. Keep a familiar routine of  bathing, brushing your teeth, climbing into bed at the same time each night, listening to soothing music. Routines increase the success of falling to sleep faster.  Use relaxation techniques. This can be using breathing and muscle tension release routines. It  can also include visualizing peaceful scenes. You can also help control troubling or intruding thoughts by keeping your mind occupied with boring or repetitive thoughts like the old concept of counting sheep. You can make it more creative like imagining planting one beautiful flower after another in your backyard garden.  During your day, work to eliminate stress. When this is not possible use some of the previous suggestions to help reduce the anxiety that accompanies stressful situations. MAKE SURE YOU:   Understand these instructions.  Will watch your condition.  Will get help right away if you are not doing well or get worse. Document Released: 05/05/2000 Document Revised: 07/31/2011 Document Reviewed: 06/05/2007 [facility] Patient Information 2014 [place].

## 2013-05-01 NOTE — Progress Notes (Signed)
Patient ID: [name]    DOB: [birth year], 62 y.o.   MRN: [id number] --- Subjective:  Raven [name] is a 62 y.o.female who presents for follow up on chronic back pain and hypertension. She also complains of difficulty sleeping.  - low back pain: new pain on her right side. Stays in low back, doesn't radiate to the leg. Nothing makes it worst or better. No associated weakness. She continues to have left thigh pain. She rates her overall pain at a 9/10. She has been taking naproxen and morphine twice a day. This was reduced from three times a day last month. She has not filled her flexeril in 1 month.   - Hypertension and recent episode of low BP: hydrochlorothiazide/HCTZ was stopped at last visit 2 weeks ago. She would like to start back her medicine. She has not had anymore episodes of low BP or feeling of dizziness and fainting.   - difficulty sleeping: ongoing for several months. She is able to fall asleep but wakes up either to urinate or to watch her night shows. Once she is up she has trouble going back to sleep. She falls asleep around 8pm while reading magazines. She then wakes up at 10pm and watches Karl Bales at 11:30pm. She is up watching TV shows from 10 to 3 or 4am. She then goes to sleep at 4:30 am and wakes up at 8am. She says that ever since she stopped working due to her back pain, she feels restless and finds herself thinking a lot. She thinks of maybe going back to school. She drinks water throughout the night.   ROS: see HPI Past Medical History: reviewed and updated medications and allergies. Social History: Tobacco: current smoker  Objective: Filed Vitals:   04/30/13 0837  BP: 150/80  Pulse: 100    Physical Examination:   General appearance - alert, well appearing, and in no distress Chest - clear to auscultation, no wheezes, rales or rhonchi, symmetric air entry Heart - normal rate, regular rhythm, normal S1, S2, no murmurs, rubs, clicks or gallops Back - tenderness to  palpation along right paralumbar muscle,  Normal strenght with hip flexion, knee flexion, extension, plantarflexion and dorsiflexion bilaterally

## 2013-05-01 NOTE — Assessment & Plan Note (Signed)
Will go back to morphine three times a day since pain not well controlled on bid. The hope is that once she gets epidural injection, we will be able to wean her off.  Continue gabapentin. Start back flexeril as pain on the right side seems to be coming from muscle spasm.  Continue naproxen

## 2013-05-01 NOTE — Assessment & Plan Note (Signed)
More elevated than usual today at 150/80 off of her blood pressure medicine.  Will start her back on it and see how she does at next visit in 1 month. Patient knows to stop medicine if she starts getting lightheaded or dizzy.

## 2013-05-01 NOTE — Assessment & Plan Note (Signed)
Very poor sleep hygiene and discussed importance of sleeping throughout the night without TV interruptions.  - limit night time TV - limit water or other drinks after 6pm.

## 2013-05-05 ENCOUNTER — Ambulatory Visit: Payer: Medicare HMO | Admitting: Family Medicine

## 2013-05-06 ENCOUNTER — Encounter (HOSPITAL_COMMUNITY): Payer: Medicare HMO

## 2013-05-06 ENCOUNTER — Other Ambulatory Visit (HOSPITAL_COMMUNITY): Payer: Medicare HMO

## 2013-05-07 ENCOUNTER — Telehealth: Payer: Self-pay | Admitting: Family Medicine

## 2013-05-07 NOTE — Telephone Encounter (Signed)
Will fwd this message to Dr. Caleb Popp who referred pt for an ECHO and as an FYI to PCP.  Ameris Akamine, Darlyne Russian, CMA

## 2013-05-07 NOTE — Telephone Encounter (Signed)
Annice Pih from [facility] is calling in reference to [name]. Raven [name] has canceled every appointment that she has been given. Her reasons are scheduling conflict or she doesn't need the test. Annice Pih wants to know what type of test or if the patient still needs this. JW

## 2013-05-07 NOTE — Telephone Encounter (Signed)
Please let patient and Raven [name] at [facility] know that I recommend that she get both the stress test and the echo the next day.   Marena Chancy, PGY-3 Family Medicine Resident

## 2013-05-07 NOTE — Telephone Encounter (Signed)
Called [place] back.  She is gone for the day.  Will call back tomorrow.  Blaise Grieshaber, Darlyne Russian, CMA

## 2013-05-08 NOTE — Telephone Encounter (Signed)
Called Jackie at [facility] back.  Instructed pt needs both stress test AND and echo.  Annice Pih will reschedule the echo for pt and remind pt to please keep appt for the stress test.  Radene Ou, CMA

## 2013-05-09 NOTE — Telephone Encounter (Signed)
Will fwd to Dr. Gwenlyn Saran.  Mazy Culton, Darlyne Russian, CMA

## 2013-05-09 NOTE — Telephone Encounter (Signed)
FYI, Dr. Caleb Popp:  This message was also sent to Dr. Caleb Popp because it states in epic that Dr.Nettey originally ordered this test with a call report to Dr. Rodell Perna under referrals in epic). I apologize if that caused confusion.  Rogelio Winbush, Darlyne Russian, CMA

## 2013-05-12 ENCOUNTER — Encounter (HOSPITAL_COMMUNITY): Payer: Medicare HMO

## 2013-05-13 ENCOUNTER — Other Ambulatory Visit (HOSPITAL_COMMUNITY): Payer: Medicare HMO

## 2013-05-13 ENCOUNTER — Encounter (HOSPITAL_COMMUNITY): Payer: Medicare HMO

## 2013-05-13 ENCOUNTER — Ambulatory Visit
Admission: RE | Admit: 2013-05-13 | Discharge: 2013-05-13 | Disposition: A | Discharge status: Home / Self Care | Payer: Medicare HMO | Source: Ambulatory Visit | Attending: Sports Medicine | Admitting: Sports Medicine

## 2013-05-13 DIAGNOSIS — M549 Dorsalgia, unspecified: Secondary | ICD-10-CM

## 2013-05-13 MED ORDER — IOHEXOL 180 MG/ML  SOLN
1.0000 mL | Freq: Once | INTRAMUSCULAR | Status: AC | PRN
  Administered 2013-05-13: 1 mL via EPIDURAL

## 2013-05-13 MED ORDER — METHYLPREDNISOLONE ACETATE 40 MG/ML INJ SUSP (RADIOLOG
120.0000 mg | Freq: Once | INTRAMUSCULAR | Status: AC
  Administered 2013-05-13: 120 mg via EPIDURAL

## 2013-05-26 ENCOUNTER — Encounter: Payer: Self-pay | Admitting: Family Medicine

## 2013-05-26 ENCOUNTER — Ambulatory Visit (INDEPENDENT_AMBULATORY_CARE_PROVIDER_SITE_OTHER): Payer: Medicare HMO | Admitting: Family Medicine

## 2013-05-26 VITALS — BP 130/72 | HR 93 | Ht 62.0 in | Wt 166.4 lb

## 2013-05-26 DIAGNOSIS — R55 Syncope and collapse: Secondary | ICD-10-CM

## 2013-05-26 DIAGNOSIS — Z716 Tobacco abuse counseling: Secondary | ICD-10-CM

## 2013-05-26 DIAGNOSIS — M5432 Sciatica, left side: Secondary | ICD-10-CM

## 2013-05-26 DIAGNOSIS — Z7189 Other specified counseling: Secondary | ICD-10-CM

## 2013-05-26 DIAGNOSIS — I10 Essential (primary) hypertension: Secondary | ICD-10-CM

## 2013-05-26 DIAGNOSIS — M5430 Sciatica, unspecified side: Secondary | ICD-10-CM

## 2013-05-26 DIAGNOSIS — F17200 Nicotine dependence, unspecified, uncomplicated: Secondary | ICD-10-CM

## 2013-05-26 MED ORDER — CYCLOBENZAPRINE HCL 10 MG PO TABS: 10.0000 mg | ORAL_TABLET | Freq: Three times a day (TID) | ORAL | Status: DC | PRN

## 2013-05-26 MED ORDER — LISINOPRIL-HYDROCHLOROTHIAZIDE 20-25 MG PO TABS: 0.5000 | ORAL_TABLET | Freq: Every day | ORAL | Status: DC

## 2013-05-26 NOTE — Assessment & Plan Note (Signed)
Strongly recommended that she be seen for the stress test. For some reason she is hesitant to get this done. I stressed the importance of getting this done.

## 2013-05-26 NOTE — Assessment & Plan Note (Signed)
On recheck: Blood pressure is much improved at 130/72. We'll continue with blood pressure medicine. Gave information on low-salt diet. Encouraged continued physical activity.

## 2013-05-26 NOTE — Assessment & Plan Note (Signed)
Improved after epidural injection. Continue current medical therapy. Refilled Flexeril. Will address decreasing the morphine at next visit. Referral for physical therapy placed. Low back exercises given.

## 2013-05-26 NOTE — Assessment & Plan Note (Signed)
In pre-contemplative stage. Recommended that she quit smoking. She understands this.

## 2013-05-26 NOTE — Patient Instructions (Signed)
I strongly recommend that you make an appointment for the stress test. This is important for your heart health.   Also, for the blood pressure, lets decrease salt and increase exercise.   Follow up in 1 month.   Continue with trying to quit smoking.

## 2013-05-26 NOTE — Progress Notes (Signed)
Patient ID: TUWANDA VOKES    DOB: 1950-06-05, 63 y.o.   MRN: [id number] --- Subjective:  Khila is a 63 y.o.female who presents for followup. Issues include: -Low-back pain: Was seen for epidural injection in late December. She says that this has helped. She has gained more function. She is able to walk more and feels less tired. She continues to have some pain on the right side that radiates to her left lateral thigh. She denies any lower extremity weakness, loss of sensation. Denies any bowel or urinary incontinence. She continues to use the morphine 3 times a day as well as the Flexeril gabapentin and naproxen.  - Hypertension: She has been taking her medicines hydrochlorothiazide/lisinopril since her last visit. She denies any chest pain, shortness of breath, artery swelling. She doesn't check her blood pressure home. She says that she has been drinking pickle juice in the last week. She walks regularly 3-4 times a week. She was scheduled for a stress test after a presyncopal episode or warranting hospital admission. She did not go and doesn't want to go quite yet.  ROS: see HPI Past Medical History: reviewed and updated medications and allergies. Social History: Tobacco: 5 cigarettes per day. Wants to reduce the number of cigarettes. Wants to try on her own.  Objective: Filed Vitals:   05/26/13 0837  BP: 160/80  Pulse: 93   Recheck: 130/72 Physical Examination:   General appearance - alert, well appearing, and in no distress Chest - clear to auscultation, no wheezes, rales or rhonchi, symmetric air entry Heart - normal rate, regular rhythm, normal S1, S2, no murmurs Extremities - no pedal edema Musculoskeletal-no tenderness along cervical, thoracic, lumbar spine. Some tenderness along the paralumbar muscle on right side. 5/ 5 strength with knee extension, knee flexion, hip flexion, foot dorsi flexion and plantar flexion bilaterally.

## 2013-05-27 ENCOUNTER — Other Ambulatory Visit (HOSPITAL_COMMUNITY): Payer: Medicare HMO

## 2013-05-27 ENCOUNTER — Encounter (HOSPITAL_COMMUNITY): Payer: Medicare HMO

## 2013-06-26 ENCOUNTER — Encounter: Payer: Self-pay | Admitting: Family Medicine

## 2013-06-26 ENCOUNTER — Ambulatory Visit (INDEPENDENT_AMBULATORY_CARE_PROVIDER_SITE_OTHER): Payer: Medicare HMO | Admitting: Family Medicine

## 2013-06-26 VITALS — BP 110/60 | HR 106 | Temp 97.4°F | Ht 62.0 in | Wt 167.0 lb

## 2013-06-26 DIAGNOSIS — Z716 Tobacco abuse counseling: Secondary | ICD-10-CM

## 2013-06-26 DIAGNOSIS — F17200 Nicotine dependence, unspecified, uncomplicated: Secondary | ICD-10-CM

## 2013-06-26 DIAGNOSIS — R1310 Dysphagia, unspecified: Secondary | ICD-10-CM | POA: Insufficient documentation

## 2013-06-26 DIAGNOSIS — Z7189 Other specified counseling: Secondary | ICD-10-CM

## 2013-06-26 DIAGNOSIS — M5430 Sciatica, unspecified side: Secondary | ICD-10-CM

## 2013-06-26 MED ORDER — CYCLOBENZAPRINE HCL 10 MG PO TABS: 10.0000 mg | ORAL_TABLET | Freq: Three times a day (TID) | ORAL | Status: DC | PRN

## 2013-06-26 MED ORDER — MORPHINE SULFATE 15 MG PO TABS: 15.0000 mg | ORAL_TABLET | Freq: Three times a day (TID) | ORAL | Status: DC

## 2013-06-26 NOTE — Assessment & Plan Note (Signed)
Patient goal: will try nicotine patch next month if she has not been able to smoke less than 4 cigarettes by then.  - will review next month

## 2013-06-26 NOTE — Progress Notes (Signed)
Patient ID: Raven [name]    DOB: 06/04/1950, 63 y.o.   MRN: [id number] --- Subjective:  Raven [name] is a 63 y.o.female who presents for follow up. Her concerns include the following:  - low back pain and left thigh pain: unchanged from previous. Continues to have pain in her left thigh. No new weakness, urine or bowel incontinence. She was able to go to visit her family in [place] and feels like she is still able to be active. She cooks and walks in [employer]. Pain is worst when she strenghtens up and better with bending.   - left sided throat pain: worst with swallowing. Started last Saturday. Has been able to eat and drink. No fevers, no chills. No weight loss. She has multiple teeth with extensive cavities and was seen at the dentist yesterday with a plan to pull teeth from the upper left gum. She also feels like pain radiates to her left ear when she swallows. No shortness of breath. No cough. No rhinorrhea. No congestion.   - smoking cessation: has been trying to quit smoking and is now down to 4 cigs per day from 5 cigs per day. She has noticed that she smokes when she is in the bedroom which is the only place in the house where she can smoke. She would like to continue trying on her own. If she is unable to, she will try using the nicotine patch.   ROS: see HPI Past Medical History: reviewed and updated medications and allergies. Social History: Tobacco: see above  Objective: Filed Vitals:   06/26/13 0830  BP: 110/60  Pulse: 106  Temp: 97.4 F (36.3 C)    Physical Examination:   General appearance - alert, well appearing, and in no distress Ears - bilateral TM's and external ear canals normal Nose - normal and patent, no erythema, discharge or polyps Mouth - mucous membranes moist, oropharynx clear, multiple dental caries throughout mouth, impacted molar on left upper gum Neck - supple, tender around the anterior left neck with one palpable lymph node, mild soft tissue swelling  on left compared to right Chest - clear to auscultation, no wheezes, rales or rhonchi, symmetric air entry Heart - normal rate, regular rhythm, normal S1, S2, no murmurs Back - no spinal tenderness to palpation, tenderness along left paralumbar muscles, tenderness along lateral thigh 5/5 strength with knee extension, flexion, plantarflexion and dorsiflexion of foot, and hip flexion bilaterally

## 2013-06-26 NOTE — Patient Instructions (Signed)
Check your blood pressure 3 times a week and bring your log back at your next visit. If you feel light headed or dizzy, please check your blood pressure and give the clinic a call.   For the neck pain, if it doesn't get better in the next 1 week, call the clinic so that I can set up an ultrasound  Nicotine skin patches What is this medicine? NICOTINE ([place] teen) helps people stop smoking. The patches replace the nicotine found in cigarettes and help to decrease withdrawal effects. They are most effective when used in combination with a stop-smoking program. This medicine may be used for other purposes; ask your health care provider or pharmacist if you have questions. COMMON BRAND NAME(S): Habitrol, Nicoderm CQ, Nicotrol What should I tell my health care provider before I take this medicine? They need to know if you have any of these conditions: -diabetes -heart disease, angina, irregular heartbeat or previous heart attack -lung disease, including asthma -overactive thyroid -pheochromocytoma -skin problems -stomach problems or ulcers -an unusual or allergic reaction to nicotine, adhesives, other medicines, foods, dyes, or preservatives -pregnant or trying to get pregnant -breast-feeding How should I use this medicine? This medicine is for use on the skin. Follow the directions that come with the patches. Find an area of skin on your upper arm, chest, or back that is clean, dry, greaseless, undamaged and hairless. Wash hands with plain soap and water. Do not use anything that contains aloe, lanolin or glycerin as these may prevent the patch from sticking. Dry thoroughly. Remove the patch from the sealed pouch. Do not try to cut or trim the patch. Using your palm, press the patch firmly in place for 10 seconds to make sure that there is good contact with your skin. After applying the patch, wash your hands. Change the patch every day, keeping to a regular schedule. When you apply a new patch,  use a new area of skin. Wait at least 1 week before using the same area again. Talk to your pediatrician regarding the use of this medicine in children. Special care may be needed. Overdosage: If you think you have taken too much of this medicine contact a poison control center or emergency room at once. NOTE: This medicine is only for you. Do not share this medicine with others. What if I miss a dose? If you forget to replace a patch, use it as soon as you can. Only use one patch at a time and do not leave on the skin for longer than directed. If a patch falls off, you can replace it, but keep to your schedule and remove the patch at the right time. What may interact with this medicine? -medicines for asthma -medicines for blood pressure -medicines for mental depression This list may not describe all possible interactions. Give your health care provider a list of all the medicines, herbs, non-prescription drugs, or dietary supplements you use. Also tell them if you smoke, drink alcohol, or use illegal drugs. Some items may interact with your medicine. What should I watch for while using this medicine? Do not smoke, chew nicotine gum, or use snuff while you are using this medicine. This reduces the chance of a nicotine overdose. You can keep the patch in place during swimming, bathing, and showering. If your patch falls off during these activities, replace it. When you first apply the patch, your skin may itch or burn. This should soon go away. When you remove a patch, the  skin may look red, but this should only last for a day. Call your doctor or health care professional if you get a permanent skin rash. If you are a diabetic and you quit smoking, the effects of insulin may be increased and you may need to reduce your insulin dose. Check with your doctor or health care professional about how you should adjust your insulin dose. If you are going to have a magnetic resonance imaging (MRI) procedure,  tell your MRI technician if you have this patch on your body. It must be removed before a MRI. What side effects may I notice from receiving this medicine? Side effects that you should report to your doctor or health care professional as soon as possible: -allergic reactions like skin rash, itching or hives, swelling of the face, lips, or tongue -breathing problems -changes in hearing -changes in vision -chest pain -cold sweats -confusion -fast, irregular heartbeat -feeling faint or lightheaded, falls -headache -increased saliva -nausea, vomiting -skin redness that lasts more than 4 days -stomach pain -weakness Side effects that usually do not require medical attention (report to your doctor or health care professional if they continue or are bothersome): -diarrhea -dry mouth -hiccups -irritability -nervousness or restlessness -trouble sleeping or vivid dreams This list may not describe all possible side effects. Call your doctor for medical advice about side effects. You may report side effects to FDA at 1-800-FDA-1088. Where should I keep my medicine? Keep out of the reach of children. Store at room temperature between 20 and 25 degrees C (68 and 77 degrees F). Protect from heat and light. Store in International aid/development worker until ready to use. Throw away unused medicine after the expiration date. When you remove a patch, fold with sticky sides together; put in an empty opened pouch and throw away. NOTE: This sheet is a summary. It may not cover all possible information. If you have questions about this medicine, talk to your doctor, pharmacist, or health care provider.  2014, Elsevier/Gold Standard. (2010-07-12 13:06:00)

## 2013-06-26 NOTE — Assessment & Plan Note (Addendum)
Possibly from lymphadenopathy from dental process. Patient is to follow up with dentist by middle of the month.  No evidence of obstruction of the oropharynx. No fevers. Ability to eat and drink at this time.  Will closely monitor for now. If doesn't improve in 1 week, patient is to call and we will get an anterior neck ultrasound to better evaluate area.  If worst, patient told to come in sooner.

## 2013-06-26 NOTE — Assessment & Plan Note (Signed)
Chronic. Unchanged. Has good strength and good function.  Refill for morphine and flexeril done

## 2013-07-30 ENCOUNTER — Encounter: Payer: Self-pay | Admitting: Family Medicine

## 2013-07-30 ENCOUNTER — Ambulatory Visit (INDEPENDENT_AMBULATORY_CARE_PROVIDER_SITE_OTHER): Payer: Medicare HMO | Admitting: Family Medicine

## 2013-07-30 VITALS — BP 135/77 | HR 91 | Temp 98.2°F | Ht 62.0 in | Wt 168.0 lb

## 2013-07-30 DIAGNOSIS — Z716 Tobacco abuse counseling: Secondary | ICD-10-CM

## 2013-07-30 DIAGNOSIS — M5430 Sciatica, unspecified side: Secondary | ICD-10-CM

## 2013-07-30 DIAGNOSIS — E785 Hyperlipidemia, unspecified: Secondary | ICD-10-CM

## 2013-07-30 DIAGNOSIS — F17200 Nicotine dependence, unspecified, uncomplicated: Secondary | ICD-10-CM

## 2013-07-30 DIAGNOSIS — Z7189 Other specified counseling: Secondary | ICD-10-CM

## 2013-07-30 MED ORDER — MORPHINE SULFATE 15 MG PO TABS: 15.0000 mg | ORAL_TABLET | Freq: Three times a day (TID) | ORAL | Status: DC

## 2013-07-30 MED ORDER — ATORVASTATIN CALCIUM 20 MG PO TABS: 20.0000 mg | ORAL_TABLET | Freq: Every day | ORAL | Status: DC

## 2013-07-30 MED ORDER — CYCLOBENZAPRINE HCL 10 MG PO TABS: 10.0000 mg | ORAL_TABLET | Freq: Three times a day (TID) | ORAL | Status: DC | PRN

## 2013-07-30 NOTE — Assessment & Plan Note (Signed)
Encouraged continued cessation.  Patient not interested in nicotine patch at this time.

## 2013-07-30 NOTE — Progress Notes (Signed)
Patient ID: Raven [name]    DOB: 1951/02/07, 63 y.o.   MRN: [id number] --- Subjective:  Raven [name] is a 63 y.o.female with h/o moderate spinal stenosis from L2 to S1 and annular tear of left L5-S1 who presents for follow up on chronic back pain.  Pain in her left thigh area is flaring up today and she thinks it may be related to the weather. Before then, she had been doing better and felt like she had fewer aches and pains. She has been doing more laundry and doing more cooking. She walks daily and had one day when she was at [employer] and walked around the store for 3 hrs with the help of the cart.  She denies any new weakness, urine or bowel incontinence.  She takes the morphine 15mg  tid and the flexeril tid   ROS: see HPI Past Medical History: reviewed and updated medications and allergies. Social History: Tobacco: 4 cigarettes per day  Objective: Filed Vitals:   07/30/13 0839  BP: 135/77  Pulse: 91  Temp: 98.2 F (36.8 C)    Physical Examination:   General appearance - alert, well appearing, and in no distress Chest - clear to auscultation, no wheezes, rales or rhonchi, symmetric air entry Heart - normal rate, regular rhythm, normal S1, S2, no murmurs, rubs, clicks or gallops Lower back - mild tenderness to palpation of left paralumbar muscles, tenderness along left thigh 5/5 strength bilaterally with hip flexion, knee extension, knee flexion, foot dorsiflexion and plantarflexion.

## 2013-07-30 NOTE — Assessment & Plan Note (Signed)
Patient has been on lipitor for 3 months. At next visit, patient to come fasting so that we can check lipid panel.  Refilled lipitor today

## 2013-07-30 NOTE — Assessment & Plan Note (Signed)
Stable with some improved function.  - refilled morphine and flexeril.  - continue gabapentin and naproxen

## 2013-07-30 NOTE — Patient Instructions (Signed)
We are going to check your cholesterol at next visit. Please come fasting then.   Continue with reducing the number of cigarettes you are smoking. You;re doing a good job!  Follow up in 1 month

## 2013-09-01 ENCOUNTER — Encounter: Payer: Self-pay | Admitting: Family Medicine

## 2013-09-01 ENCOUNTER — Ambulatory Visit (INDEPENDENT_AMBULATORY_CARE_PROVIDER_SITE_OTHER): Payer: Medicare HMO | Admitting: Family Medicine

## 2013-09-01 VITALS — BP 154/72 | HR 94 | Temp 98.2°F | Ht 62.0 in | Wt 165.0 lb

## 2013-09-01 DIAGNOSIS — E785 Hyperlipidemia, unspecified: Secondary | ICD-10-CM

## 2013-09-01 DIAGNOSIS — M5430 Sciatica, unspecified side: Secondary | ICD-10-CM

## 2013-09-01 DIAGNOSIS — Z7189 Other specified counseling: Secondary | ICD-10-CM

## 2013-09-01 DIAGNOSIS — Z716 Tobacco abuse counseling: Secondary | ICD-10-CM

## 2013-09-01 DIAGNOSIS — F17200 Nicotine dependence, unspecified, uncomplicated: Secondary | ICD-10-CM

## 2013-09-01 DIAGNOSIS — I10 Essential (primary) hypertension: Secondary | ICD-10-CM

## 2013-09-01 MED ORDER — GABAPENTIN 800 MG PO TABS: 800.0000 mg | ORAL_TABLET | Freq: Three times a day (TID) | ORAL | Status: DC

## 2013-09-01 MED ORDER — RANITIDINE HCL 150 MG PO CAPS: 150.0000 mg | ORAL_CAPSULE | Freq: Two times a day (BID) | ORAL | Status: DC

## 2013-09-01 MED ORDER — NAPROXEN 500 MG PO TABS: 500.0000 mg | ORAL_TABLET | Freq: Two times a day (BID) | ORAL | Status: DC

## 2013-09-01 MED ORDER — CYCLOBENZAPRINE HCL 10 MG PO TABS: 10.0000 mg | ORAL_TABLET | Freq: Three times a day (TID) | ORAL | Status: DC | PRN

## 2013-09-01 MED ORDER — MORPHINE SULFATE 15 MG PO TABS: 15.0000 mg | ORAL_TABLET | Freq: Three times a day (TID) | ORAL | Status: DC

## 2013-09-01 MED ORDER — LISINOPRIL-HYDROCHLOROTHIAZIDE 20-25 MG PO TABS: 0.5000 | ORAL_TABLET | Freq: Every day | ORAL | Status: DC

## 2013-09-01 MED ORDER — ATORVASTATIN CALCIUM 20 MG PO TABS: 20.0000 mg | ORAL_TABLET | Freq: Every day | ORAL | Status: DC

## 2013-09-01 MED ORDER — GABAPENTIN 400 MG PO CAPS: 400.0000 mg | ORAL_CAPSULE | Freq: Three times a day (TID) | ORAL | Status: DC

## 2013-09-01 NOTE — Progress Notes (Signed)
Patient ID: Raven [name]    DOB: 05-13-1951, 63 y.o.   MRN: [id number] --- Subjective:  Raven [name] is a 63 y.o.female with h/o moderate spinal stenosis from L2 to S1 and annular tear of left L5-S1 who presents for med refill for chronic left back pain.  # chronic back pain with radiation to the left leg: stable and unchanged. Continues to walk and use shopping cart when walking at the store. She uses cane out of the home and otherwise doesn't use inside the home. She needs to sit down when cooking. No lower extremity weakness, no urine or bowel incontinence. Takes morphine 15mg  tid, gabapentin 1200 tid, naproxen and flexeril.   # HTN: taking lisinopril/hctz 10/12.5. No cp, no sob. No lower ext swelling.  Smoking: down to 2-3 cigarettes per day. She has identified her trigger as staying in the bedroom. If she is not in the bedroom, she doesn't smoke.     ROS: see HPI Past Medical History: reviewed and updated medications and allergies. Social History: Tobacco: see above  Objective: Filed Vitals:   09/01/13 0834  BP: 154/72  Pulse: 94  Temp: 98.2 F (36.8 C)   Repeat BP: 130/78 Physical Examination:   General appearance - alert, well appearing, and in no distress Chest - clear to auscultation, no wheezes, rales or rhonchi, symmetric air entry Heart - normal rate, regular rhythm, normal S1, S2, no murmurs, rubs, clicks or gallops MSK - no tenderness to palpation along lumbar spine or paralumbar muscles,  4+/5 strength in lower extremities bilaterally.

## 2013-09-01 NOTE — Assessment & Plan Note (Signed)
Repeat BP improved and normal. Continue current regimen

## 2013-09-01 NOTE — Assessment & Plan Note (Signed)
Stable function. Continue current regimen of gabapentin, morphine, flexeril, naproxen.  She had epidural injection in January which helped her symptoms. She may benefit from another at some point.

## 2013-09-01 NOTE — Patient Instructions (Signed)
Great job with the smoking!  At your follow up visit, come fasting so that we can check your cholesterol.

## 2013-09-01 NOTE — Assessment & Plan Note (Signed)
Congratulated her on her progress with smoking cessation. No quit date at this point. Glad that she has identified her trigger.

## 2013-09-01 NOTE — Assessment & Plan Note (Signed)
Patient never received refill for lipitor and has not been taking it for a month. Refilled it and will recheck fasting lipid in 1-2 months.

## 2013-10-01 ENCOUNTER — Encounter: Payer: Self-pay | Admitting: Family Medicine

## 2013-10-01 ENCOUNTER — Ambulatory Visit (INDEPENDENT_AMBULATORY_CARE_PROVIDER_SITE_OTHER): Payer: Medicare HMO | Admitting: Family Medicine

## 2013-10-01 VITALS — BP 120/80 | HR 89 | Ht 62.0 in | Wt 167.0 lb

## 2013-10-01 DIAGNOSIS — E785 Hyperlipidemia, unspecified: Secondary | ICD-10-CM

## 2013-10-01 DIAGNOSIS — M5430 Sciatica, unspecified side: Secondary | ICD-10-CM

## 2013-10-01 DIAGNOSIS — G5600 Carpal tunnel syndrome, unspecified upper limb: Secondary | ICD-10-CM | POA: Insufficient documentation

## 2013-10-01 MED ORDER — ATORVASTATIN CALCIUM 20 MG PO TABS: 20.0000 mg | ORAL_TABLET | Freq: Every day | ORAL | Status: DC

## 2013-10-01 MED ORDER — CYCLOBENZAPRINE HCL 10 MG PO TABS: 10.0000 mg | ORAL_TABLET | Freq: Three times a day (TID) | ORAL | Status: DC | PRN

## 2013-10-01 MED ORDER — MORPHINE SULFATE 15 MG PO TABS: 15.0000 mg | ORAL_TABLET | Freq: Three times a day (TID) | ORAL | Status: DC

## 2013-10-01 NOTE — Assessment & Plan Note (Signed)
Recent flare up of pain.  - refilled morphine and flexeril.  - continue naproxen and gabapentin - recommended follow up at sports medicine for evaluation for repeat epidural injection.

## 2013-10-01 NOTE — Assessment & Plan Note (Signed)
Normal strength.  Trial of cock up wrist splint. Follow up in 1 month

## 2013-10-01 NOTE — Progress Notes (Signed)
Patient ID: TAHLIA DEAMER    DOB: [birth year], 63 y.o.   MRN: [id number] --- Subjective:  Ersa is a 63 y.o.female who presents for follow up on chronic back pain. Patient states that pain has been getting more severe in the last few weeks. Pain is located in the left lower back, no radiation. Nothing makes it better. Movement and standing straight make it worst. Pain in left foot as well. No change in medical therapy. Had epidural injection a few months ago which helped.   - numbness and tingling in right 3 middle fingers. Worst in the am. No weakness. Intermittent. Ongoing for 2 months. History of carpal tunnel. used to wear brace which helped.  .   ROS: see HPI Past Medical History: reviewed and updated medications and allergies. Social History: Tobacco: 0.2 pack per day  Objective: Filed Vitals:   10/01/13 0838  BP: 120/80  Pulse: 89    Physical Examination:   General appearance - alert, well appearing, and in no distress Chest - clear to auscultation, no wheezes, rales or rhonchi, symmetric air entry Heart - normal rate, regular rhythm, normal S1, S2, no murmurs, rubs, clicks or gallops Back - tenderness to palpation along left paralumbar muscle, no spinal tenderness, normal strength in lower extremities bilaterally, trace reflex bilaterally Wrist: normal finger abduction strength, normal thenar eminence. Positive tinnel's, negative Phalen's.

## 2013-10-01 NOTE — Assessment & Plan Note (Signed)
Patient has still not filled lipitor due to pharmacy miscommunication.  - gave hard copy of Rx for patient to fill - repeat fasting labs in 4 weeks.  - will also check CMP at that time too

## 2013-10-01 NOTE — Patient Instructions (Addendum)
Follow up in one month.  Follow up with sports medicine for the back. [phone]  It is ok for you to eat a banana or toast in the morning, as long as you eat something.   Carpal Tunnel Syndrome The carpal tunnel is an area under the skin of the palm of your hand. Nerves, blood vessels, and strong tissues (tendons) pass through the tunnel. The tunnel can become puffy (swollen). If this happens, a nerve can be pinched in the wrist. This causes carpal tunnel syndrome.  HOME CARE  Take all medicine as told by your doctor.  If you were given a splint, wear it as told. Wear it at night or at times when your doctor told you to.  Rest your wrist from the activity that causes your pain.  Put ice on your wrist after long periods of wrist activity.  Put ice in a plastic bag.  Place a towel between your skin and the bag.  Leave the ice on for 15-20 minutes, 03-04 times a day.  Keep all doctor visits as told. GET HELP RIGHT AWAY IF:  You have new problems you cannot explain.  Your problems get worse and medicine does not help. MAKE SURE YOU:   Understand these instructions.  Will watch your condition.  Will get help right away if you are not doing well or get worse. Document Released: 04/27/2011 Document Revised: 07/31/2011 Document Reviewed: 04/27/2011 [facility] Patient Information 2014 [place], [place].

## 2013-10-17 ENCOUNTER — Telehealth: Payer: Self-pay | Admitting: Family Medicine

## 2013-10-17 NOTE — Telephone Encounter (Signed)
Pt called because the prescription for her morphine was called in as extended release and she does not use that and it also cost more. Can we call and get this changed today because she is out. Raven [name]

## 2013-10-20 ENCOUNTER — Ambulatory Visit: Payer: Medicare HMO | Admitting: Family Medicine

## 2013-10-20 NOTE — Telephone Encounter (Signed)
Fwd to Dr.Losq for info. Raven [name]

## 2013-10-20 NOTE — Telephone Encounter (Addendum)
Please call [facility] on [address]- [phone] and ask for the pharmacy to discuss the issue with the rx for patient's morphine.  They are saying that it is one for a 12 hour extension release.  Patient confused at this time as to what she is actually taking.  Rx cost more than what she was use to paying.  Haven't had her med since Thursday last.   Please call back to give info about the final outcome

## 2013-10-20 NOTE — Telephone Encounter (Signed)
Reviewed phone notes  Liam Graham, PGY-3 Family Medicine Resident

## 2013-10-20 NOTE — Telephone Encounter (Signed)
Pt called to let Dr. Otis Dials know that everything is good and the medication was correct but the pharmacy had made a mistake. jw

## 2013-11-06 ENCOUNTER — Ambulatory Visit (INDEPENDENT_AMBULATORY_CARE_PROVIDER_SITE_OTHER): Payer: Medicare HMO | Admitting: Family Medicine

## 2013-11-06 ENCOUNTER — Encounter: Payer: Self-pay | Admitting: Family Medicine

## 2013-11-06 VITALS — BP 137/71 | HR 87 | Ht 62.0 in | Wt 167.0 lb

## 2013-11-06 DIAGNOSIS — M5430 Sciatica, unspecified side: Secondary | ICD-10-CM

## 2013-11-06 DIAGNOSIS — M5432 Sciatica, left side: Secondary | ICD-10-CM

## 2013-11-06 DIAGNOSIS — M79609 Pain in unspecified limb: Secondary | ICD-10-CM

## 2013-11-06 DIAGNOSIS — I10 Essential (primary) hypertension: Secondary | ICD-10-CM

## 2013-11-06 DIAGNOSIS — M79605 Pain in left leg: Secondary | ICD-10-CM

## 2013-11-06 DIAGNOSIS — M7989 Other specified soft tissue disorders: Secondary | ICD-10-CM

## 2013-11-06 DIAGNOSIS — E785 Hyperlipidemia, unspecified: Secondary | ICD-10-CM

## 2013-11-06 LAB — COMPREHENSIVE METABOLIC PANEL
ALT: 15 U/L (ref 0–35)
AST: 24 U/L (ref 0–37)
Albumin: 4 g/dL (ref 3.5–5.2)
Alkaline Phosphatase: 89 U/L (ref 39–117)
BUN: 22 mg/dL (ref 6–23)
CALCIUM: 9.1 mg/dL (ref 8.4–10.5)
CHLORIDE: 101 meq/L (ref 96–112)
CO2: 22 mEq/L (ref 19–32)
CREATININE: 0.91 mg/dL (ref 0.50–1.10)
Glucose, Bld: 85 mg/dL (ref 70–99)
Potassium: 4.2 mEq/L (ref 3.5–5.3)
Sodium: 133 mEq/L — ABNORMAL LOW (ref 135–145)
Total Bilirubin: 0.3 mg/dL (ref 0.2–1.2)
Total Protein: 7.4 g/dL (ref 6.0–8.3)

## 2013-11-06 LAB — POCT GLYCOSYLATED HEMOGLOBIN (HGB A1C): HEMOGLOBIN A1C: 5.4

## 2013-11-06 LAB — LDL CHOLESTEROL, DIRECT: Direct LDL: 76 mg/dL

## 2013-11-06 MED ORDER — GABAPENTIN 800 MG PO TABS: 800.0000 mg | ORAL_TABLET | Freq: Three times a day (TID) | ORAL | Status: DC

## 2013-11-06 MED ORDER — NAPROXEN 500 MG PO TABS: 500.0000 mg | ORAL_TABLET | Freq: Two times a day (BID) | ORAL | Status: DC

## 2013-11-06 MED ORDER — CYCLOBENZAPRINE HCL 10 MG PO TABS: 10.0000 mg | ORAL_TABLET | Freq: Three times a day (TID) | ORAL | Status: DC | PRN

## 2013-11-06 MED ORDER — LISINOPRIL-HYDROCHLOROTHIAZIDE 20-25 MG PO TABS: 0.5000 | ORAL_TABLET | Freq: Every day | ORAL | Status: DC

## 2013-11-06 MED ORDER — MORPHINE SULFATE 15 MG PO TABS: 15.0000 mg | ORAL_TABLET | Freq: Three times a day (TID) | ORAL | Status: DC

## 2013-11-06 MED ORDER — GABAPENTIN 400 MG PO CAPS: 400.0000 mg | ORAL_CAPSULE | Freq: Three times a day (TID) | ORAL | Status: DC

## 2013-11-06 NOTE — Progress Notes (Signed)
Patient ID: Raven [name]    DOB: April 06, 1951, 63 y.o.   MRN: [id number] --- Subjective:  Raven [name] is a 63 y.o.female who presents for follow up on low back pain and left leg pain.  - left leg pain: worst since Sunday, throbbing pain located on anterior side of shin and lateral aspect. This is similar to previous episodes of leg pain except for presence of swelling. She went on a long car trip to [place] last week and returned Sunday. While in [place] she walked up and down stairs more than usual, walked without her cane or walker for an hour and had worsening pain in foot and back afterwards. Foot pain better with flexeril.  - low back pain: unchanged from prior. Worst with walking or standing for long periods of time. Aggravated with recent activities in [place]. Better with leaning forward.  Takes morphine 15mg  three times per day.  - hyperlipidemia: has been taking lipitor 40mg  daily and tolerating the medicine without myalgias or other side effects.   ROS: see HPI Past Medical History: reviewed and updated medications and allergies. Social History: Tobacco: 4cigs per day  Objective: Filed Vitals:   11/06/13 0835  BP: 137/71  Pulse: 87    Physical Examination:   General appearance - alert, well appearing, and in no distress Chest - clear to auscultation, no wheezes, rales or rhonchi, symmetric air entry Heart - normal rate, regular rhythm, normal S1, S2, no murmurs, rubs, clicks or gallops Extremities - DP pulse present bilaterally, 1+ pitting edema in left foot compared to none on left, increased calf size in left leg compared to right.  Back - no tenderness to palpation along spine or paralumbar muscles, 5/5 strength in lower ext bilaterally

## 2013-11-06 NOTE — Patient Instructions (Signed)
The clinic will be in touch with the results.   It was great having you as my patient!

## 2013-11-06 NOTE — Assessment & Plan Note (Signed)
On lipitor for 4 weeks.  Check direct ldl and a1c and cmp

## 2013-11-06 NOTE — Assessment & Plan Note (Signed)
Likely exacerbation of sciatica and referred pain to leg, but given swelling in left leg compared to right and recent long car trip, cannot exclude DVT.  - check lower ext duplex to rule out DVT

## 2013-11-06 NOTE — Assessment & Plan Note (Signed)
Refilled morphine, flexeril, naproxen and gabapentin

## 2013-11-07 ENCOUNTER — Ambulatory Visit (HOSPITAL_COMMUNITY): Admission: RE | Admit: 2013-11-07 | Payer: Medicare HMO | Source: Ambulatory Visit

## 2013-11-07 ENCOUNTER — Ambulatory Visit (HOSPITAL_COMMUNITY)
Admission: RE | Admit: 2013-11-07 | Discharge: 2013-11-07 | Disposition: A | Discharge status: Home / Self Care | Payer: Medicare HMO | Source: Ambulatory Visit | Attending: Family Medicine | Admitting: Family Medicine

## 2013-11-07 DIAGNOSIS — F17200 Nicotine dependence, unspecified, uncomplicated: Secondary | ICD-10-CM | POA: Insufficient documentation

## 2013-11-07 DIAGNOSIS — M79609 Pain in unspecified limb: Secondary | ICD-10-CM

## 2013-11-07 DIAGNOSIS — M5430 Sciatica, unspecified side: Secondary | ICD-10-CM | POA: Insufficient documentation

## 2013-11-07 DIAGNOSIS — M7989 Other specified soft tissue disorders: Secondary | ICD-10-CM

## 2013-11-07 DIAGNOSIS — M79605 Pain in left leg: Secondary | ICD-10-CM

## 2013-11-07 NOTE — Progress Notes (Signed)
Left lower extremity venous duplex completed.  Left:  No evidence of DVT, superficial thrombosis, or Baker's cyst.  Right:  Negative for DVT in the common femoral vein.  

## 2013-11-10 ENCOUNTER — Encounter: Payer: Self-pay | Admitting: Family Medicine

## 2013-11-11 ENCOUNTER — Telehealth: Payer: Self-pay | Admitting: Family Medicine

## 2013-11-11 NOTE — Telephone Encounter (Signed)
Pt brought in papers to be completed about disability. She brought them to her appt with Dr Otis Dials June 18. She wants to know if they are complete

## 2013-11-12 NOTE — Telephone Encounter (Signed)
Please let patient know that the paperwork will be ready to pick up by Thursday afternoon. I am needing to get the signature and Satchel Heidinger number from one of the attendings.   Thank you!  Liam Graham, PGY-3 Family Medicine Resident

## 2013-11-14 NOTE — Telephone Encounter (Signed)
Left message to return call. Please tell patient that her paperwork is ready for her to pick up.Busick, Kevin Fenton

## 2013-11-25 ENCOUNTER — Other Ambulatory Visit: Payer: Self-pay | Admitting: Family Medicine

## 2013-11-25 NOTE — Telephone Encounter (Signed)
Pt called because all the medications that were sent in to [facility] on 10/27/13 by Dr. Otis Dials. [facility] stating that they do not have them Cane we re-send this? jw

## 2013-12-03 ENCOUNTER — Ambulatory Visit (INDEPENDENT_AMBULATORY_CARE_PROVIDER_SITE_OTHER): Payer: Medicare HMO | Admitting: Family Medicine

## 2013-12-03 ENCOUNTER — Encounter: Payer: Self-pay | Admitting: Family Medicine

## 2013-12-03 VITALS — BP 109/70 | HR 102 | Temp 97.9°F | Ht 62.0 in | Wt 167.0 lb

## 2013-12-03 DIAGNOSIS — E785 Hyperlipidemia, unspecified: Secondary | ICD-10-CM

## 2013-12-03 DIAGNOSIS — M5430 Sciatica, unspecified side: Secondary | ICD-10-CM

## 2013-12-03 DIAGNOSIS — J45909 Unspecified asthma, uncomplicated: Secondary | ICD-10-CM

## 2013-12-03 DIAGNOSIS — M5432 Sciatica, left side: Secondary | ICD-10-CM

## 2013-12-03 MED ORDER — MORPHINE SULFATE 15 MG PO TABS: 15.0000 mg | ORAL_TABLET | Freq: Three times a day (TID) | ORAL | Status: DC

## 2013-12-03 NOTE — Assessment & Plan Note (Signed)
Stable on prn albuterol, using only 2 times per month. Continue this medicine.

## 2013-12-03 NOTE — Progress Notes (Signed)
Patient ID: Raven [name], female   DOB: [birth year], 63 y.o.   MRN: [id number]  HPI:  Pain meds refilled - has chronic low back and left leg pain from diffuse lumbar disc and facet degeneration. Last MRI June 2012. Takes MSIR 15mg  TID, every day. Also takes gabapentin 1200mg  TID, naproxen 500mg  BID, flexeril TID prn. Ambulates with cane. No fever, crotch numbness, bowel/bladder dysfunction, leg weakness. States she does not want to have surgery ever. Comes monthly for pain medication refill and is due for this today. She would like to get off of some of these medicines. Willing to stop naproxen today.   Asthma - uses albuterol only twice per month. Can't afford flovent, doesn't think she needs this medicine anyways.   HLD - she hopes to eventually be off of the cholesterol medicine, hoping by next month  ROS: See HPI. No dizziness or lightheadedness.  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   FOLLOW UP: F/u in 1 month for pain medicine refills  Raven [name], [place]

## 2013-12-03 NOTE — Assessment & Plan Note (Signed)
Advised pt that statin use is for long term prevention of cardiovascular disease, not necessarily to target specific cholesterol #. She was agreeable to continuing this medicine.

## 2013-12-03 NOTE — Patient Instructions (Signed)
It was nice to meet you today!  I refilled your morphine pills. Follow up in one month.  We decided to STOP your naproxen today.  Be well, Dr. [name]

## 2013-12-03 NOTE — Assessment & Plan Note (Signed)
Stable on chronic pain medications. Refilled morphine today. Reviewed pain contract with pt today (previously signed), since it is my first time meeting her. Will plan for UDS at next visit. STOP naproxen due to GI/renal side effects (and pt wants to come off of some medicines). F/u 1 month.

## 2014-01-07 ENCOUNTER — Ambulatory Visit (INDEPENDENT_AMBULATORY_CARE_PROVIDER_SITE_OTHER): Payer: Medicare HMO | Admitting: Family Medicine

## 2014-01-07 ENCOUNTER — Encounter: Payer: Self-pay | Admitting: Family Medicine

## 2014-01-07 VITALS — BP 134/80 | HR 83 | Temp 97.9°F | Ht 62.0 in | Wt 164.8 lb

## 2014-01-07 DIAGNOSIS — R519 Headache, unspecified: Secondary | ICD-10-CM

## 2014-01-07 DIAGNOSIS — M5430 Sciatica, unspecified side: Secondary | ICD-10-CM

## 2014-01-07 DIAGNOSIS — M5432 Sciatica, left side: Secondary | ICD-10-CM

## 2014-01-07 DIAGNOSIS — R51 Headache: Secondary | ICD-10-CM

## 2014-01-07 MED ORDER — MORPHINE SULFATE 15 MG PO TABS: 15.0000 mg | ORAL_TABLET | Freq: Three times a day (TID) | ORAL | Status: DC

## 2014-01-07 MED ORDER — CYCLOBENZAPRINE HCL 10 MG PO TABS: 10.0000 mg | ORAL_TABLET | Freq: Three times a day (TID) | ORAL | Status: DC | PRN

## 2014-01-07 NOTE — Patient Instructions (Signed)
It was great to see you again today!  Keep a headache diary Use naproxen or ibuprofen just as needed (don't mix these two)  I refilled your morphine and flexeril Come back in one month to see how you're doing.  Be well, Dr. [name]

## 2014-01-11 DIAGNOSIS — R51 Headache: Secondary | ICD-10-CM

## 2014-01-11 DIAGNOSIS — R519 Headache, unspecified: Secondary | ICD-10-CM | POA: Insufficient documentation

## 2014-01-11 NOTE — Progress Notes (Signed)
Patient ID: Raven [name], female   DOB: November 09, 1950, 63 y.o.   MRN: [id number]  HPI:  Pain medicine refill:  Needs refill on morphine PO and flexeril. Takes morphine for her back pain. States she takes it three times a day. Has not been helping much as of late, thinks her body is becoming tolerant to the medicine. Can't walk far distances or stand for very long due to the pain. Has to sit while cooking. Also has pain in her L foot on the top, worse with dorsiflexion. Pain radiates up to her thigh. Back pain is primarily in her R lower back. Has had this pain for years, started around October 2011. No hx of injury but used to walk and lift a lot for her job. No problems with fevers, loss of bowel/bladder control, or crotch numbness. She thinks part of her back pain is that she hasn't been taking flexeril very much lately. Needs this refilled.  Headache: has had headaches for about 1 month. Gets pain in the center of her head between her eyes. At her last visit we stopped her naproxen. She has avoided taking it since then. Takes tylenol, which helps the headache, but says it is slow to relieve it. Last week had headache every day. Has had similar headaches in the past. Primarily occurs between 10 and 11am.  ROS: See HPI.  [place]: hx HLD, HTN, asthma, GERD  PHYSICAL EXAM: BP 134/80  Pulse 83  Temp(Src) 97.9 F (36.6 C) (Oral)  Ht 5\' 2"  (1.575 m)  Wt 164 lb 12.8 oz (74.753 kg)  BMI 30.13 kg/m2 Gen: NAD HEENT: NCAT, PERRL Heart: RRR Lungs: CTAB, NWOB Neuro: cranial nerves II-XII intact, speech normal, oriented, full strength in all extremities Back: spasm in lower paraspinous muscles in lumbar area Ext: No appreciable lower extremity edema bilaterally   ASSESSMENT/PLAN:  Headache Neuro exam nonfocal today. Suspect possibly related to stopping naproxen at last visit. Advised it is ok for her to use an NSAID on a prn basis (as opposed to the BID naproxen she was using scheduled before).  Encouraged her to keep a headache diary as well to determine what could be contributing. Will f/u at next visit in 1 month.  SCIATICA, LEFT Pain not well controlled at current. No red flags. Will refill morphine, also refill flexeril which will hopefully give more relief. F/u in 1 month. Needs UDS at that visit.   FOLLOW UP: F/u in 1 month for headache and chronic pain.  [place]. [name], [place]

## 2014-01-11 NOTE — Assessment & Plan Note (Signed)
Neuro exam nonfocal today. Suspect possibly related to stopping naproxen at last visit. Advised it is ok for her to use an NSAID on a prn basis (as opposed to the BID naproxen she was using scheduled before). Encouraged her to keep a headache diary as well to determine what could be contributing. Will f/u at next visit in 1 month.

## 2014-01-11 NOTE — Assessment & Plan Note (Signed)
Pain not well controlled at current. No red flags. Will refill morphine, also refill flexeril which will hopefully give more relief. F/u in 1 month. Needs UDS at that visit.

## 2014-01-28 ENCOUNTER — Ambulatory Visit (INDEPENDENT_AMBULATORY_CARE_PROVIDER_SITE_OTHER): Payer: Medicare HMO | Admitting: Family Medicine

## 2014-01-28 ENCOUNTER — Telehealth: Payer: Self-pay | Admitting: Family Medicine

## 2014-01-28 ENCOUNTER — Encounter: Payer: Self-pay | Admitting: Family Medicine

## 2014-01-28 VITALS — BP 116/76 | HR 104 | Temp 98.2°F | Ht 62.0 in | Wt 166.8 lb

## 2014-01-28 DIAGNOSIS — M5432 Sciatica, left side: Secondary | ICD-10-CM

## 2014-01-28 DIAGNOSIS — M5430 Sciatica, unspecified side: Secondary | ICD-10-CM

## 2014-01-28 DIAGNOSIS — K089 Disorder of teeth and supporting structures, unspecified: Secondary | ICD-10-CM

## 2014-01-28 DIAGNOSIS — K0889 Other specified disorders of teeth and supporting structures: Secondary | ICD-10-CM

## 2014-01-28 MED ORDER — IBUPROFEN 600 MG PO TABS: 600.0000 mg | ORAL_TABLET | Freq: Every day | ORAL | Status: DC | PRN

## 2014-01-28 MED ORDER — AMOXICILLIN-POT CLAVULANATE 875-125 MG PO TABS: 1.0000 | ORAL_TABLET | Freq: Two times a day (BID) | ORAL | Status: DC

## 2014-01-28 MED ORDER — MORPHINE SULFATE 15 MG PO TABS: 15.0000 mg | ORAL_TABLET | Freq: Three times a day (TID) | ORAL | Status: DC

## 2014-01-28 MED ORDER — IBUPROFEN 800 MG PO TABS: 800.0000 mg | ORAL_TABLET | Freq: Every day | ORAL | Status: DC | PRN

## 2014-01-28 NOTE — Patient Instructions (Signed)
It was great to see you again today!  For back pain: -refilled pain medicine -take ibuprofen every 8 hours as needed but no more than 2-3 times per week -follow up in 1 month  For tooth infection: -take augmentin 1 pill twice a day for 7 days -see dentist list -very important to get in with dentist soon to have tooth removed -return here or go to ER if worsening  Be well, Dr. [name]

## 2014-01-28 NOTE — Progress Notes (Signed)
Patient ID: Raven [name], female   DOB: 1950-09-14, 63 y.o.   MRN: [id number]  HPI:  Back pain: needs 90 day supply of ibuprofen in order to get it for free. Had back pain and leg pain which responded to ibuprofen the other day. Flexeril not helping as much. Taking morphine 3 times a day. Last took it last night. Overall pain is doing okay. Takes ibuprofen every 8 hours as needed, at most about 3 times per week.   Tooth pain: left bottom tooth hurting. Using anbesol for gums. It's a burning feeling. Not able to eat x 2 days. In the past has taken antibiotic for tooth pain. No fevers. This is the same tooth that's bothered her in the past. Dentist wants 193 dollars to have a tooth pulled so she is unable to go.  ROS: See HPI.  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 Tender nodule over left mandible. Left lower molar tooth broken off, gum very tender to palpation. No pus draining. No obvious abscess.  Neuro: grossly nonfocal speech normal Ext: full strength bilat lower ext  ASSESSMENT/PLAN:  SCIATICA, LEFT Doing well on current regimen. Will rx 90 day supply of ibuprofen but specifically discussed with pt that this is not to be taken every day, at most 2-3 times per week to prevent injury to stomach and kidneys. She understands. Will also refill morphine x 1 month. Did not do UDS today as we also addressed her dental infection, will defer to next month's visit.  Tooth pain Concern for infected gum. No abscess that I can discern on exam. Will rx augmentin. Given dentist list and specifically discussed with pt the importance of dental follow up to have the offending tooth removed. If unable to see dentist and tooth worsens, advised she can return here or  (better yet) go to the ER.    FOLLOW UP: F/u in 1 month for pain medicine refill.  [place]. [name], [place]

## 2014-01-28 NOTE — Telephone Encounter (Signed)
New rx sent in. Leeanne Rio, MD

## 2014-01-28 NOTE — Telephone Encounter (Signed)
Pt called and said that she does not take 600 mg of ibuprofen and wants the 800 mg called in instead. jw

## 2014-02-01 NOTE — Assessment & Plan Note (Signed)
Doing well on current regimen. Will rx 90 day supply of ibuprofen but specifically discussed with pt that this is not to be taken every day, at most 2-3 times per week to prevent injury to stomach and kidneys. She understands. Will also refill morphine x 1 month. Did not do UDS today as we also addressed her dental infection, will defer to next month's visit.

## 2014-02-01 NOTE — Assessment & Plan Note (Addendum)
Concern for infected gum. No abscess that I can discern on exam. Will rx augmentin. Given dentist list and specifically discussed with pt the importance of dental follow up to have the offending tooth removed. If unable to see dentist and tooth worsens, advised she can return here or (better yet) go to the ER.

## 2014-02-02 ENCOUNTER — Telehealth: Payer: Self-pay | Admitting: Family Medicine

## 2014-02-02 NOTE — Telephone Encounter (Signed)
Pt called because the pharmacy will not fill the 800 mg until a paper is filled out. She said that they faxed [place]. Pt said that the doctor needs to call the pharmacy at [phone] and answer the questions. jw

## 2014-02-04 NOTE — Telephone Encounter (Signed)
I believe I signed this form yesterday. Tamika RN brought it to me. It should be handled now.  Leeanne Rio, MD

## 2014-02-05 NOTE — Telephone Encounter (Signed)
Called [employer] to verify if they received the form.  Medication was sent out to patient on 02/03/2014.  Derl Barrow, RN

## 2014-03-02 ENCOUNTER — Ambulatory Visit (INDEPENDENT_AMBULATORY_CARE_PROVIDER_SITE_OTHER): Payer: Commercial Managed Care - HMO | Admitting: Family Medicine

## 2014-03-02 ENCOUNTER — Ambulatory Visit (HOSPITAL_COMMUNITY)
Admission: RE | Admit: 2014-03-02 | Discharge: 2014-03-02 | Disposition: A | Discharge status: Home / Self Care | Payer: Medicare HMO | Source: Ambulatory Visit | Attending: Family Medicine | Admitting: Family Medicine

## 2014-03-02 ENCOUNTER — Encounter: Payer: Self-pay | Admitting: Family Medicine

## 2014-03-02 ENCOUNTER — Ambulatory Visit (HOSPITAL_COMMUNITY)
Admission: RE | Admit: 2014-03-02 | Discharge: 2014-03-02 | Disposition: A | Discharge status: Home / Self Care | Payer: Medicare HMO | Source: Ambulatory Visit | Attending: Surgery | Admitting: Surgery

## 2014-03-02 VITALS — BP 138/61 | HR 109 | Temp 98.3°F | Ht 62.0 in | Wt 169.9 lb

## 2014-03-02 DIAGNOSIS — M79605 Pain in left leg: Secondary | ICD-10-CM

## 2014-03-02 DIAGNOSIS — M7989 Other specified soft tissue disorders: Secondary | ICD-10-CM | POA: Insufficient documentation

## 2014-03-02 DIAGNOSIS — K0889 Other specified disorders of teeth and supporting structures: Secondary | ICD-10-CM

## 2014-03-02 DIAGNOSIS — M25472 Effusion, left ankle: Secondary | ICD-10-CM | POA: Diagnosis not present

## 2014-03-02 DIAGNOSIS — K088 Other specified disorders of teeth and supporting structures: Secondary | ICD-10-CM

## 2014-03-02 DIAGNOSIS — M5432 Sciatica, left side: Secondary | ICD-10-CM

## 2014-03-02 MED ORDER — CYCLOBENZAPRINE HCL 10 MG PO TABS: 10.0000 mg | ORAL_TABLET | Freq: Three times a day (TID) | ORAL | Status: DC | PRN

## 2014-03-02 MED ORDER — MORPHINE SULFATE 15 MG PO TABS: 15.0000 mg | ORAL_TABLET | Freq: Three times a day (TID) | ORAL | Status: DC

## 2014-03-02 NOTE — Patient Instructions (Addendum)
It was great to see you again today!  For leg swelling: -checking ultrasound & xray -use ice and elevate the leg -as long as ultrasound okay, will have you use an ace bandage to wrap the ankle  For pain: -refilled medicine for 1 month  Return in 1 week if ankle not better, otherwise 1 month.  Be well, Dr. [name]

## 2014-03-02 NOTE — Progress Notes (Signed)
VASCULAR LAB PRELIMINARY  PRELIMINARY  PRELIMINARY  PRELIMINARY  Left lower extremity venous duplex completed.    Preliminary report:  Left:  No evidence of DVT, superficial thrombosis, or Baker's cyst.  Remigio Mcmillon, RVT 03/02/2014, 5:44 PM

## 2014-03-02 NOTE — Progress Notes (Signed)
Patient ID: Raven [name], female   DOB: 22-Feb-1951, 63 y.o.   MRN: [id number]  HPI:  L ankle swelling: Foot dragging since Saturday. Has happened before, reports she's had to be hospitalized. No history of stroke. Eating & drinking okay. Thinks it's a little weak. Has noticed L ankle swelling about 1 week. Did not injure it that she knows of. It hurts a little around the ankle. Urinating well. Reports chronic pain in back/thigh is worsening. Has not put ice on it. Takes ibuprofen twice a day maybe. L calf tenderness and swelling as well.  Chronic pain: Pain in L thigh/lower part of back. Takes morphine TID. Also takes flexeril when she remembers it.  F/u tooth: still has pain in tooth. She took the antibiotic but ran out. Has been working on finding a Pharmacist, community.  ROS: See HPI.  [place]: hx HLD, HTN, GERD, asthma, L sided sciatica  PHYSICAL EXAM: BP 138/61  Pulse 109  Temp(Src) 98.3 F (36.8 C) (Oral)  Ht 5\' 2"  (1.575 m)  Wt 169 lb 14.4 oz (77.066 kg)  BMI 31.07 kg/m2 Gen: NAD HEENT: NCAT, MMM, tooth broken without drainage or obvious infection Heart: RRR Lungs: CTAB Neuro: grossly nonfocal, speech normal Ext: L ankle mildly swollen. Full strength with ankle plantarflexion/dorsiflexion/inversion/eversion. Full strength with hip abduction, adduction, knee flexion & extension on L. Sensation intact to light touch over foot. 2+ DP pulses bilat. Very mild calf tenderness but no erythema, warmth, or palpable cords.  ASSESSMENT/PLAN:  Tooth pain No obvious active infection. Again advised pt to schedule appt with dentist.  Left leg pain Suspect musculoskeletal cause of pain and swelling. Of course, given her hx of left-sided sciatica must be extra cautious. At this time she has full strength in foot, fully sensate. Will check xray of foot and lower extremity venous doppler to rule out occult fracture and DVT. If doppler negative for clot, will recommend compression wrap, ice, elevation.  Explained to pt the importance of close follow up if swelling and pain does not improve. Precepted with Dr. Andria Frames who agrees with this plan.   SCIATICA, LEFT Refill morphine x 1 mo. F/u in 1 mo for refill, needs UDS that visit.   FOLLOW UP: F/u in 1 month for chronic pain, sooner if ankle not improving  [place]. [name], [place]

## 2014-03-03 ENCOUNTER — Telehealth: Payer: Self-pay | Admitting: Family Medicine

## 2014-03-03 NOTE — Telephone Encounter (Signed)
Patient informed of msg below.

## 2014-03-03 NOTE — Telephone Encounter (Signed)
Please call pt and let her know: 1.  xray did not show any fractures and her ultrasound did not show any blood clots.  2. She should wrap the ankle with an ace bandage, keep it elevated, and put ice on it.  3. Please also reiterate that if her ankle isn't better in a week she needs to return to be seen again.  Thanks, Leeanne Rio, MD

## 2014-03-04 ENCOUNTER — Emergency Department (HOSPITAL_COMMUNITY)
Admission: EM | Admit: 2014-03-04 | Discharge: 2014-03-04 | Disposition: A | Discharge status: Home / Self Care | Payer: Medicare HMO | Attending: Emergency Medicine | Admitting: Emergency Medicine

## 2014-03-04 ENCOUNTER — Encounter (HOSPITAL_COMMUNITY): Payer: Self-pay | Admitting: Emergency Medicine

## 2014-03-04 DIAGNOSIS — K002 Abnormalities of size and form of teeth: Secondary | ICD-10-CM | POA: Diagnosis not present

## 2014-03-04 DIAGNOSIS — Z79899 Other long term (current) drug therapy: Secondary | ICD-10-CM | POA: Insufficient documentation

## 2014-03-04 DIAGNOSIS — K029 Dental caries, unspecified: Secondary | ICD-10-CM | POA: Insufficient documentation

## 2014-03-04 DIAGNOSIS — M5441 Lumbago with sciatica, right side: Secondary | ICD-10-CM

## 2014-03-04 DIAGNOSIS — I10 Essential (primary) hypertension: Secondary | ICD-10-CM | POA: Diagnosis not present

## 2014-03-04 DIAGNOSIS — K088 Other specified disorders of teeth and supporting structures: Secondary | ICD-10-CM | POA: Diagnosis not present

## 2014-03-04 DIAGNOSIS — Z720 Tobacco use: Secondary | ICD-10-CM | POA: Insufficient documentation

## 2014-03-04 DIAGNOSIS — K0889 Other specified disorders of teeth and supporting structures: Secondary | ICD-10-CM

## 2014-03-04 DIAGNOSIS — K219 Gastro-esophageal reflux disease without esophagitis: Secondary | ICD-10-CM | POA: Diagnosis not present

## 2014-03-04 DIAGNOSIS — J45909 Unspecified asthma, uncomplicated: Secondary | ICD-10-CM | POA: Insufficient documentation

## 2014-03-04 DIAGNOSIS — M5442 Lumbago with sciatica, left side: Secondary | ICD-10-CM

## 2014-03-04 DIAGNOSIS — M5440 Lumbago with sciatica, unspecified side: Secondary | ICD-10-CM | POA: Diagnosis not present

## 2014-03-04 DIAGNOSIS — M549 Dorsalgia, unspecified: Secondary | ICD-10-CM | POA: Diagnosis present

## 2014-03-04 MED ORDER — PENICILLIN V POTASSIUM 500 MG PO TABS: 500.0000 mg | ORAL_TABLET | Freq: Four times a day (QID) | ORAL | Status: AC

## 2014-03-04 MED ORDER — HYDROCODONE-ACETAMINOPHEN 5-325 MG PO TABS: 1.0000 | ORAL_TABLET | ORAL | Status: DC | PRN

## 2014-03-04 MED ORDER — HYDROCODONE-ACETAMINOPHEN 5-325 MG PO TABS
1.0000 | ORAL_TABLET | Freq: Once | ORAL | Status: AC
  Administered 2014-03-04: 1 via ORAL
  Filled 2014-03-04: qty 1

## 2014-03-04 NOTE — ED Provider Notes (Signed)
CSN: [id number]     Arrival date & time 03/04/14  1456 History   First MD Initiated Contact with Patient 03/04/14 1601     Chief Complaint  Patient presents with  . Dental Pain  . Back Pain  . Leg Pain   (Consider location/radiation/quality/duration/timing/severity/associated sxs/prior Treatment) HPI  Raven [name] is a 63 yo female presenting bilat upper dental pain x 2 days.  She had similar pain 1 month ago and treated with antibiotic but was not able to follow up with a dentist.  She reports having difficulty eating because of pain.   She also report intermittent back pain that was recently worked-up.  She has known disc issues and is supposed to get surgery but has not been able to do that yet.   She denies fever, nausea or vomiting, no chest pain or shortness of breath.  Past Medical History  Diagnosis Date  . Hypertension   . Spinal stenosis of lumbar region   . Asthma   . Tobacco abuse   . GERD (gastroesophageal reflux disease)   . Generalized headaches     ocasional, she uses naproxen.   Past Surgical History  Procedure Laterality Date  . Cesarean section      x2  . Tracheostomy      when she was 20   History reviewed. No pertinent family history. History  Substance Use Topics  . Smoking status: Current Every Day Smoker -- 0.30 packs/day    Types: Cigarettes  . Smokeless tobacco: Never Used     Comment: will quitt this year  . Alcohol Use: No   OB History   Grav Para Term Preterm Abortions TAB SAB Ect Mult Living                 Review of Systems  Constitutional: Negative for fever and chills.  HENT: Positive for dental problem. Negative for sore throat.   Eyes: Negative for visual disturbance.  Respiratory: Negative for cough and shortness of breath.   Cardiovascular: Negative for chest pain and leg swelling.  Gastrointestinal: Negative for nausea, vomiting and diarrhea.  Genitourinary: Negative for dysuria.  Musculoskeletal: Positive for back pain and  gait problem. Negative for myalgias.  Skin: Negative for rash.  Neurological: Negative for weakness, numbness and headaches.    Allergies  Procaine hcl  Home Medications   Prior to Admission medications   Medication Sig Start Date End Date Taking? Authorizing Provider  albuterol (PROVENTIL HFA;VENTOLIN HFA) 108 (90 BASE) MCG/ACT inhaler Inhale into the lungs every 6 (six) hours as needed for wheezing or shortness of breath.   Yes Historical Provider, MD  atorvastatin (LIPITOR) 20 MG tablet Take 1 tablet (20 mg total) by mouth daily. 10/01/13  Yes Kandis Nab, MD  Calcium-Magnesium-Vitamin D (CITRACAL CALCIUM+D) 600-40-500 MG-MG-UNIT TB24 Take 1 tablet by mouth 2 (two) times daily.    Yes Historical Provider, MD  cyclobenzaprine (FLEXERIL) 10 MG tablet Take 1 tablet (10 mg total) by mouth 3 (three) times daily as needed for muscle spasms. 03/02/14  Yes Leeanne Rio, MD  gabapentin (NEURONTIN) 400 MG capsule Take 400 mg by mouth 3 (three) times daily.   Yes Historical Provider, MD  gabapentin (NEURONTIN) 800 MG tablet Take 800 mg by mouth 3 (three) times daily.   Yes Historical Provider, MD  ibuprofen (ADVIL,MOTRIN) 800 MG tablet Take 800 mg by mouth every 8 (eight) hours as needed for mild pain.   Yes Historical Provider, MD  lisinopril-hydrochlorothiazide (PRINZIDE,ZESTORETIC) 20-25  MG per tablet Take 0.5 tablets by mouth daily.   Yes Historical Provider, MD  morphine (MSIR) 15 MG tablet Take 1 tablet (15 mg total) by mouth 3 (three) times daily. 03/02/14  Yes Leeanne Rio, MD   BP 106/58  Pulse 113  Temp(Src) 97.9 F (36.6 C)  Resp 15  SpO2 98% Physical Exam  Nursing note and vitals reviewed. Constitutional: She appears well-developed and well-nourished. No distress.  HENT:  Head: Normocephalic and atraumatic.  Mouth/Throat: Oropharynx is clear and moist. No trismus in the jaw. Abnormal dentition. Dental caries present. No dental abscesses. No oropharyngeal  exudate.    Eyes: Conjunctivae are normal.  Neck: Neck supple. No thyromegaly present.  Cardiovascular: Normal rate, regular rhythm and intact distal pulses.   Pulmonary/Chest: Effort normal and breath sounds normal. No respiratory distress. She has no wheezes. She has no rales. She exhibits no tenderness.  Abdominal: Soft. There is no tenderness.  Musculoskeletal: She exhibits tenderness.       Lumbar back: She exhibits no bony tenderness.       Back:       Legs: Lymphadenopathy:    She has no cervical adenopathy.  Neurological: She is alert. No cranial nerve deficit or sensory deficit. GCS eye subscore is 4. GCS verbal subscore is 5. GCS motor subscore is 6.  Reflex Scores:      Patellar reflexes are 2+ on the right side and 2+ on the left side. 5/5 Rt SLR, 4/5 Lt SLR  Skin: Skin is warm and dry. No rash noted. She is not diaphoretic.  Psychiatric: She has a normal mood and affect.    ED Course  Procedures (including critical care time) Labs Review Labs Reviewed - No data to display  Imaging Review Dg Ankle Complete Left  03/03/2014   CLINICAL DATA:  63 year old female with acute left lower extremity swelling x4 days. Left ankle pain and limping. Initial encounter.  EXAM: LEFT ANKLE COMPLETE - 3+ VIEW  COMPARISON:  Left tib-fib series 1117 2013.  FINDINGS: Bone mineralization is within normal limits. No ankle joint effusion. Calcaneus intact. Mortise joint aligned. Talar dome intact. No acute fracture.  IMPRESSION: No acute osseous abnormality identified at the left ankle.   Electronically Signed   By: Lars Pinks M.D.   On: 03/03/2014 08:36     EKG Interpretation None      MDM   Final diagnoses:  Pain, dental  Bilateral low back pain with sciatica, sciatica laterality unspecified   63 yo with re-current dental pain.  Pt also reports with re-current back pain, no new injury. Left tib-fib xray ordered at triage is negative for acute abnormality. [facility]. No gross dental abscess.  Exam unconcerning for Ludwig's angina or spread of infection.  Will treat with penicillin and pain medicine.  Referral given to follow-up with dentist. Return precautions provided.  Pt in agreement with plan.    Filed Vitals:   03/04/14 1506 03/04/14 1527 03/04/14 1530 03/04/14 1716  BP: 103/72 103/51 106/58 122/67  Pulse: 110 111 113 100  Temp: 97.9 F (36.6 C)     Resp: 18 11 15 18   SpO2: 94% 98% 98% 98%   Meds given in ED:  Medications  HYDROcodone-acetaminophen (NORCO/VICODIN) 5-325 MG per tablet 1 tablet (1 tablet Oral Given 03/04/14 1745)    Discharge Medication List as of 03/04/2014  5:41 PM    START taking these medications   Details  HYDROcodone-acetaminophen (NORCO/VICODIN) 5-325  MG per tablet Take 1 tablet by mouth every 4 (four) hours as needed for moderate pain or severe pain., Starting 03/04/2014, Until Discontinued, Print    penicillin v potassium (VEETID) 500 MG tablet Take 1 tablet (500 mg total) by mouth 4 (four) times daily., Starting 03/04/2014, Last dose on Wed 03/11/14, Print           Britt Bottom, NP 03/08/14 2017

## 2014-03-04 NOTE — ED Notes (Signed)
Pt undressed and placed in gown.   Pt monitored by pulse ox, bp cuff, and 5-lead.

## 2014-03-04 NOTE — Discharge Instructions (Signed)
Please follow the directions provided.  Be sure to follow-up with the dentist listed for treatment of your dental pain.  Please take the antibiotic until it is all gone.  You may take the pain medicine as directed.   You will need to follow up with the [facility] center regarding your back pain for further management.  Don't hesitate to return for new, worsening or concerning symptoms.      SEEK IMMEDIATE MEDICAL CARE IF:  You have a fever.  You develop redness and swelling of your face, jaw, or neck.  You are unable to open your mouth.  You have severe pain uncontrolled by pain medicine.

## 2014-03-04 NOTE — ED Provider Notes (Addendum)
complains dental pain for several months of bilateral ear pain. also complains of low back pain for the past several years radiating to her left foot. No new injury. No fever. No loss of bladder or bowel control. On exam she is alert nontoxic Glasgow Coma Score 15 HEENT exam poor dentition generally with multiple caries no gingival fluctuance no trismus no cervical lymphadenopathy lungs clear auscultation abdomen obese, nontender neurologic Glasgow Coma Score 15 gait normal. Patient suffers from chronic back pain. She will be given dental referral prescription for penicillin and Ceasar Lund, MD 03/04/14 1739  Orlie Dakin, MD 03/09/14 1447

## 2014-03-04 NOTE — ED Notes (Signed)
Per pt sts dental pain, back pain and sts her left leg has been dragging intermittently over a few months. sts pain in left leg and foot.

## 2014-03-09 NOTE — Assessment & Plan Note (Addendum)
Suspect musculoskeletal cause of pain and swelling. Of course, given her hx of left-sided sciatica must be extra cautious. At this time she has full strength in foot, fully sensate. Will check xray of foot and lower extremity venous doppler to rule out occult fracture and DVT. If doppler negative for clot, will recommend compression wrap, ice, elevation. Explained to pt the importance of close follow up if swelling and pain does not improve. Precepted with Dr. Andria Frames who agrees with this plan.

## 2014-03-09 NOTE — Assessment & Plan Note (Signed)
Refill morphine x 1 mo. F/u in 1 mo for refill, needs UDS that visit.

## 2014-03-09 NOTE — Assessment & Plan Note (Signed)
No obvious active infection. Again advised pt to schedule appt with dentist.

## 2014-03-09 NOTE — ED Provider Notes (Signed)
Medical screening examination/treatment/procedure(s) were conducted as a shared visit with non-physician practitioner(s) and myself.  I personally evaluated the patient during the encounter.   EKG Interpretation None       Orlie Dakin, MD 03/09/14 1447

## 2014-04-01 ENCOUNTER — Encounter: Payer: Self-pay | Admitting: Family Medicine

## 2014-04-01 ENCOUNTER — Ambulatory Visit (INDEPENDENT_AMBULATORY_CARE_PROVIDER_SITE_OTHER): Payer: Commercial Managed Care - HMO | Admitting: Family Medicine

## 2014-04-01 VITALS — BP 134/78 | HR 90 | Ht 62.0 in | Wt 169.0 lb

## 2014-04-01 DIAGNOSIS — M5432 Sciatica, left side: Secondary | ICD-10-CM

## 2014-04-01 DIAGNOSIS — I10 Essential (primary) hypertension: Secondary | ICD-10-CM

## 2014-04-01 DIAGNOSIS — Z23 Encounter for immunization: Secondary | ICD-10-CM

## 2014-04-01 DIAGNOSIS — M549 Dorsalgia, unspecified: Secondary | ICD-10-CM

## 2014-04-01 DIAGNOSIS — F1190 Opioid use, unspecified, uncomplicated: Secondary | ICD-10-CM

## 2014-04-01 DIAGNOSIS — G8929 Other chronic pain: Secondary | ICD-10-CM

## 2014-04-01 MED ORDER — MORPHINE SULFATE 15 MG PO TABS: 15.0000 mg | ORAL_TABLET | Freq: Three times a day (TID) | ORAL | Status: DC

## 2014-04-01 NOTE — Patient Instructions (Signed)
It was great to see you again today!  I am referring you to neurosurgery/spine specialist for your back pain. You will get a phone call to schedule this appointment.   Refilled pain medicine for 1 month. Return in 1 mo for refill. BP looks good. Continue current medicines.  Be well, Dr. [name]

## 2014-04-01 NOTE — Assessment & Plan Note (Signed)
Well-controlled.  Continue current regimen. 

## 2014-04-01 NOTE — Assessment & Plan Note (Signed)
Pain worsening, now desires referral to neurosurgery. Will order this. Refill morphine x 1 month today. Check UDS today for chronic narcotic monitoring. F/u with me in 1 mo.

## 2014-04-01 NOTE — Progress Notes (Signed)
Patient ID: [name], female   DOB: January 10, 1951, 63 y.o.   MRN: [id number]  HPI:  Chronic back pain: having pain in back & left leg. Better with morphine than without it. Takes 1 tab 15mg  PO TID. Needs refill today. Would like [facility] referral for back surgery. Previously had not wanted any surgical intervention but now states the pain is bad enough that she would like to pursue this. Denies problems with bowel/bladder function or fevers.  HTN - takes lisinopril-HCTZ 20-25mg  1/2 tab daily. No CP or SOB. Tolerating this medicine fine.  ROS: See HPI.  [place]: hx asthma, GERD, HLD, HTN, tobacco abuse  PHYSICAL EXAM: BP 134/78 mmHg  Pulse 90  Ht 5\' 2"  (1.575 m)  Wt 169 lb (76.658 kg)  BMI 30.90 kg/m2 Gen: NAD HEENT: NCAT Heart: RRR Lungs: CTAB Neuro: grossly nonfocal speech normal Back: low back diffusely TTP Ext: no hyperrflexia either side. Full strength bilat lower ext with hip flexion, abduction, adduction, knee extension/flexion. Sensation intact to light touch over bilat lower ext.  ASSESSMENT/PLAN:  Health maintenance:  -given pneumonia vaccine today (indication = smoker)  SCIATICA, LEFT Pain worsening, now desires referral to neurosurgery. Will order this. Refill morphine x 1 month today. Check UDS today for chronic narcotic monitoring. F/u with me in 1 mo.  HYPERTENSION, BENIGN SYSTEMIC Well controlled. Continue current regimen.    FOLLOW UP: F/u in 1 month for chronic pain Also referring to neurosurgery.  [place]. [name], [place]

## 2014-04-02 LAB — DRUG SCR UR, PAIN MGMT, REFLEX CONF
Amphetamine Screen, Ur: NEGATIVE
Barbiturate Quant, Ur: NEGATIVE
Benzodiazepines.: NEGATIVE
CREATININE, U: 40.88 mg/dL
Cocaine Metabolites: NEGATIVE
Marijuana Metabolite: NEGATIVE
Methadone: NEGATIVE
Phencyclidine (PCP): NEGATIVE
Propoxyphene: NEGATIVE

## 2014-04-04 LAB — OPIATES/OPIOIDS (LC/MS-MS)
Codeine Urine: NEGATIVE ng/mL (ref <50)
Hydrocodone: NEGATIVE ng/mL (ref <50)
Hydromorphone: 119 ng/mL — ABNORMAL HIGH (ref <50)
MORPHINE: 8673 ng/mL — AB (ref <50)
Norhydrocodone, Ur: NEGATIVE ng/mL (ref <50)
Noroxycodone, Ur: NEGATIVE ng/mL (ref <50)
Oxycodone, ur: NEGATIVE ng/mL (ref <50)
Oxymorphone: NEGATIVE ng/mL (ref <50)

## 2014-04-07 ENCOUNTER — Other Ambulatory Visit: Payer: Self-pay | Admitting: *Deleted

## 2014-04-07 MED ORDER — GABAPENTIN 400 MG PO CAPS: 400.0000 mg | ORAL_CAPSULE | Freq: Three times a day (TID) | ORAL | Status: DC

## 2014-04-07 MED ORDER — GABAPENTIN 800 MG PO TABS: 800.0000 mg | ORAL_TABLET | Freq: Three times a day (TID) | ORAL | Status: DC

## 2014-04-22 ENCOUNTER — Encounter: Payer: Self-pay | Admitting: Family Medicine

## 2014-04-22 ENCOUNTER — Ambulatory Visit (INDEPENDENT_AMBULATORY_CARE_PROVIDER_SITE_OTHER): Payer: Commercial Managed Care - HMO | Admitting: Family Medicine

## 2014-04-22 VITALS — BP 133/79 | HR 96 | Temp 98.3°F | Ht 62.0 in | Wt 166.0 lb

## 2014-04-22 DIAGNOSIS — Z7189 Other specified counseling: Secondary | ICD-10-CM

## 2014-04-22 DIAGNOSIS — K0889 Other specified disorders of teeth and supporting structures: Secondary | ICD-10-CM

## 2014-04-22 DIAGNOSIS — G8929 Other chronic pain: Secondary | ICD-10-CM

## 2014-04-22 DIAGNOSIS — K088 Other specified disorders of teeth and supporting structures: Secondary | ICD-10-CM

## 2014-04-22 DIAGNOSIS — M5432 Sciatica, left side: Secondary | ICD-10-CM

## 2014-04-22 MED ORDER — MORPHINE SULFATE 15 MG PO TABS: 15.0000 mg | ORAL_TABLET | Freq: Four times a day (QID) | ORAL | Status: DC | PRN

## 2014-04-22 NOTE — Progress Notes (Signed)
Patient ID: Raven [name], female   DOB: April 11, 1951, 63 y.o.   MRN: [id number]  HPI:  Dental pain: had two teeth pulled top middle bottom left. Now having pain in R upper area. Getting other teeth removed at a later date but insurance has run out for now. It will probably be February by the time she is able to get back in. Not on antibiotic right now.  Left sciatica - having flare of pain due to weather. Hoping to go to [place] neurosurgery but hasn't received a call to schedule this. No crotch numbness, weakness, bowel/bladder dysfunction. Pain medication is helping her manage.  ROS: See HPI.  [place]: hx HTN, HLD, asthma, GERD, chronic back pain  PHYSICAL EXAM: BP 133/79 mmHg  Pulse 96  Temp(Src) 98.3 F (36.8 C) (Oral)  Ht 5\' 2"  (1.575 m)  Wt 166 lb (75.297 kg)  BMI 30.35 kg/m2 Gen: NAD, cooperative HEENT: NCAT, poor dentition. MMM. No obvious abscess around R upper molar. Neuro: grossly nonfocal, speech normal, normal gait  ASSESSMENT/PLAN:  SCIATICA, LEFT Will increase quantity this month to 120 pills since she has acute dental pain. Advised caution as incr dose may make her sleepy. Told her we will follow up with neurosurgery clinic to see if they can get her in soon (our office already sent referral info over but pt has not heard from them). F/u in 1 month.  Tooth pain No sign of acute infection at this time. Needs teeth removed but has to wait until February due to insurance coverage issues. Will increase quantity of chronic pain med to help with dental pain control.    FOLLOW UP: F/u in 1 month for chronic pain.  [place]. [name], [place]

## 2014-04-22 NOTE — Patient Instructions (Signed)
It was great to see you again today!  For pain: Increasing morphine to four times a day as needed to help with your tooth pain. We'll look into the neurosurgery situation.  I'll see you back in 1 month.  Be well, Dr. [name]

## 2014-04-23 ENCOUNTER — Other Ambulatory Visit: Payer: Self-pay | Admitting: Family Medicine

## 2014-04-23 DIAGNOSIS — G8929 Other chronic pain: Secondary | ICD-10-CM | POA: Insufficient documentation

## 2014-04-23 DIAGNOSIS — E785 Hyperlipidemia, unspecified: Secondary | ICD-10-CM

## 2014-04-23 MED ORDER — GABAPENTIN 800 MG PO TABS: 800.0000 mg | ORAL_TABLET | Freq: Three times a day (TID) | ORAL | Status: DC

## 2014-04-23 MED ORDER — GABAPENTIN 400 MG PO CAPS: 400.0000 mg | ORAL_CAPSULE | Freq: Three times a day (TID) | ORAL | Status: DC

## 2014-04-23 MED ORDER — ATORVASTATIN CALCIUM 20 MG PO TABS: 20.0000 mg | ORAL_TABLET | Freq: Every day | ORAL | Status: DC

## 2014-04-23 MED ORDER — LISINOPRIL-HYDROCHLOROTHIAZIDE 20-25 MG PO TABS: 0.5000 | ORAL_TABLET | Freq: Every day | ORAL | Status: DC

## 2014-04-23 NOTE — Assessment & Plan Note (Signed)
Will increase quantity this month to 120 pills since she has acute dental pain. Advised caution as incr dose may make her sleepy. Told her we will follow up with neurosurgery clinic to see if they can get her in soon (our office already sent referral info over but pt has not heard from them). F/u in 1 month.

## 2014-04-23 NOTE — Assessment & Plan Note (Signed)
No sign of acute infection at this time. Needs teeth removed but has to wait until February due to insurance coverage issues. Will increase quantity of chronic pain med to help with dental pain control.

## 2014-05-16 ENCOUNTER — Emergency Department (HOSPITAL_COMMUNITY): Payer: Medicare HMO

## 2014-05-16 ENCOUNTER — Inpatient Hospital Stay (HOSPITAL_COMMUNITY)
Admission: EM | Admit: 2014-05-16 | Discharge: 2014-05-18 | DRG: 641 | Disposition: A | Discharge status: Home / Self Care | Payer: Medicare HMO | Attending: Family Medicine | Admitting: Family Medicine

## 2014-05-16 ENCOUNTER — Encounter (HOSPITAL_COMMUNITY): Payer: Self-pay | Admitting: Family Medicine

## 2014-05-16 DIAGNOSIS — A084 Viral intestinal infection, unspecified: Secondary | ICD-10-CM | POA: Insufficient documentation

## 2014-05-16 DIAGNOSIS — I10 Essential (primary) hypertension: Secondary | ICD-10-CM | POA: Diagnosis present

## 2014-05-16 DIAGNOSIS — E860 Dehydration: Principal | ICD-10-CM | POA: Diagnosis present

## 2014-05-16 DIAGNOSIS — I959 Hypotension, unspecified: Secondary | ICD-10-CM | POA: Diagnosis present

## 2014-05-16 DIAGNOSIS — J4520 Mild intermittent asthma, uncomplicated: Secondary | ICD-10-CM | POA: Diagnosis present

## 2014-05-16 DIAGNOSIS — Z79891 Long term (current) use of opiate analgesic: Secondary | ICD-10-CM

## 2014-05-16 DIAGNOSIS — E785 Hyperlipidemia, unspecified: Secondary | ICD-10-CM | POA: Diagnosis present

## 2014-05-16 DIAGNOSIS — G8929 Other chronic pain: Secondary | ICD-10-CM

## 2014-05-16 DIAGNOSIS — F17210 Nicotine dependence, cigarettes, uncomplicated: Secondary | ICD-10-CM | POA: Diagnosis present

## 2014-05-16 DIAGNOSIS — M549 Dorsalgia, unspecified: Secondary | ICD-10-CM

## 2014-05-16 DIAGNOSIS — Z79899 Other long term (current) drug therapy: Secondary | ICD-10-CM

## 2014-05-16 DIAGNOSIS — E872 Acidosis: Secondary | ICD-10-CM | POA: Diagnosis present

## 2014-05-16 DIAGNOSIS — E871 Hypo-osmolality and hyponatremia: Secondary | ICD-10-CM | POA: Diagnosis present

## 2014-05-16 DIAGNOSIS — R109 Unspecified abdominal pain: Secondary | ICD-10-CM | POA: Insufficient documentation

## 2014-05-16 DIAGNOSIS — R197 Diarrhea, unspecified: Secondary | ICD-10-CM

## 2014-05-16 DIAGNOSIS — M545 Low back pain, unspecified: Secondary | ICD-10-CM

## 2014-05-16 DIAGNOSIS — R112 Nausea with vomiting, unspecified: Secondary | ICD-10-CM

## 2014-05-16 DIAGNOSIS — N179 Acute kidney failure, unspecified: Secondary | ICD-10-CM | POA: Diagnosis present

## 2014-05-16 DIAGNOSIS — Z791 Long term (current) use of non-steroidal anti-inflammatories (NSAID): Secondary | ICD-10-CM

## 2014-05-16 DIAGNOSIS — K219 Gastro-esophageal reflux disease without esophagitis: Secondary | ICD-10-CM | POA: Diagnosis present

## 2014-05-16 HISTORY — DX: Dorsalgia, unspecified: M54.9

## 2014-05-16 HISTORY — DX: Radiculopathy, lumbar region: M54.16

## 2014-05-16 HISTORY — DX: Sciatica, left side: M54.32

## 2014-05-16 HISTORY — DX: Other chronic pain: G89.29

## 2014-05-16 HISTORY — DX: Pain in leg, unspecified: M79.606

## 2014-05-16 LAB — CBC WITH DIFFERENTIAL/PLATELET
BASOS PCT: 0 % (ref 0–1)
Basophils Absolute: 0 10*3/uL (ref 0.0–0.1)
Eosinophils Absolute: 0.1 10*3/uL (ref 0.0–0.7)
Eosinophils Relative: 0 % (ref 0–5)
HEMATOCRIT: 47.6 % — AB (ref 36.0–46.0)
Hemoglobin: 16.6 g/dL — ABNORMAL HIGH (ref 12.0–15.0)
Lymphocytes Relative: 15 % (ref 12–46)
Lymphs Abs: 1.7 10*3/uL (ref 0.7–4.0)
MCH: 30.8 pg (ref 26.0–34.0)
MCHC: 34.9 g/dL (ref 30.0–36.0)
MCV: 88.3 fL (ref 78.0–100.0)
MONO ABS: 0.4 10*3/uL (ref 0.1–1.0)
Monocytes Relative: 4 % (ref 3–12)
Neutro Abs: 9.1 10*3/uL — ABNORMAL HIGH (ref 1.7–7.7)
Neutrophils Relative %: 81 % — ABNORMAL HIGH (ref 43–77)
Platelets: 365 10*3/uL (ref 150–400)
RBC: 5.39 MIL/uL — ABNORMAL HIGH (ref 3.87–5.11)
RDW: 13.3 % (ref 11.5–15.5)
WBC: 11.3 10*3/uL — ABNORMAL HIGH (ref 4.0–10.5)

## 2014-05-16 LAB — URINALYSIS, ROUTINE W REFLEX MICROSCOPIC
Glucose, UA: NEGATIVE mg/dL
KETONES UR: 15 mg/dL — AB
NITRITE: NEGATIVE
PH: 5.5 (ref 5.0–8.0)
Protein, ur: >300 mg/dL — AB
Specific Gravity, Urine: 1.023 (ref 1.005–1.030)
UROBILINOGEN UA: 0.2 mg/dL (ref 0.0–1.0)

## 2014-05-16 LAB — COMPREHENSIVE METABOLIC PANEL
ALT: 27 U/L (ref 0–35)
ANION GAP: 15 (ref 5–15)
AST: 33 U/L (ref 0–37)
Albumin: 5.1 g/dL (ref 3.5–5.2)
Alkaline Phosphatase: 126 U/L — ABNORMAL HIGH (ref 39–117)
BILIRUBIN TOTAL: 0.2 mg/dL — AB (ref 0.3–1.2)
BUN: 18 mg/dL (ref 6–23)
CHLORIDE: 102 meq/L (ref 96–112)
CO2: 15 mmol/L — ABNORMAL LOW (ref 19–32)
CREATININE: 1.92 mg/dL — AB (ref 0.50–1.10)
Calcium: 10.8 mg/dL — ABNORMAL HIGH (ref 8.4–10.5)
GFR calc non Af Amer: 26 mL/min — ABNORMAL LOW (ref >90)
GFR, EST AFRICAN AMERICAN: 31 mL/min — AB (ref >90)
Glucose, Bld: 144 mg/dL — ABNORMAL HIGH (ref 70–99)
Potassium: 4.1 mmol/L (ref 3.5–5.1)
Sodium: 132 mmol/L — ABNORMAL LOW (ref 135–145)
Total Protein: 10.8 g/dL — ABNORMAL HIGH (ref 6.0–8.3)

## 2014-05-16 LAB — CK: Total CK: 159 U/L (ref 7–177)

## 2014-05-16 LAB — TROPONIN I: Troponin I: <0.03 ng/mL (ref <0.031)

## 2014-05-16 LAB — URINE MICROSCOPIC-ADD ON

## 2014-05-16 LAB — LIPASE, BLOOD: Lipase: 93 U/L — ABNORMAL HIGH (ref 11–59)

## 2014-05-16 LAB — MAGNESIUM: Magnesium: 2.6 mg/dL — ABNORMAL HIGH (ref 1.5–2.5)

## 2014-05-16 MED ORDER — IOHEXOL 300 MG/ML  SOLN: 25.0000 mL | Freq: Once | INTRAMUSCULAR | Status: DC | PRN

## 2014-05-16 MED ORDER — ONDANSETRON HCL 4 MG/2ML IJ SOLN
4.0000 mg | Freq: Four times a day (QID) | INTRAMUSCULAR | Status: DC | PRN
  Administered 2014-05-16: 4 mg via INTRAVENOUS
  Filled 2014-05-16: qty 2

## 2014-05-16 MED ORDER — ALBUTEROL SULFATE (2.5 MG/3ML) 0.083% IN NEBU: 2.5000 mg | INHALATION_SOLUTION | Freq: Four times a day (QID) | RESPIRATORY_TRACT | Status: DC | PRN

## 2014-05-16 MED ORDER — SODIUM CHLORIDE 0.9 % IV BOLUS (SEPSIS)
500.0000 mL | Freq: Once | INTRAVENOUS | Status: AC
  Administered 2014-05-16: 500 mL via INTRAVENOUS

## 2014-05-16 MED ORDER — MORPHINE SULFATE 4 MG/ML IJ SOLN
4.0000 mg | INTRAMUSCULAR | Status: DC | PRN
  Administered 2014-05-16 – 2014-05-17 (×4): 4 mg via INTRAVENOUS
  Filled 2014-05-16 (×4): qty 1

## 2014-05-16 MED ORDER — CYCLOBENZAPRINE HCL 10 MG PO TABS
10.0000 mg | ORAL_TABLET | Freq: Three times a day (TID) | ORAL | Status: DC | PRN
  Administered 2014-05-16 – 2014-05-17 (×2): 10 mg via ORAL
  Filled 2014-05-16 (×2): qty 1

## 2014-05-16 MED ORDER — ONDANSETRON HCL 4 MG/2ML IJ SOLN
4.0000 mg | INTRAMUSCULAR | Status: AC | PRN
  Administered 2014-05-16 (×2): 4 mg via INTRAVENOUS
  Filled 2014-05-16 (×2): qty 2

## 2014-05-16 MED ORDER — SODIUM CHLORIDE 0.9 % IV SOLN
INTRAVENOUS | Status: DC
  Administered 2014-05-16 – 2014-05-17 (×2): via INTRAVENOUS

## 2014-05-16 MED ORDER — SODIUM CHLORIDE 0.9 % IJ SOLN
3.0000 mL | Freq: Two times a day (BID) | INTRAMUSCULAR | Status: DC
  Administered 2014-05-16 – 2014-05-18 (×4): 3 mL via INTRAVENOUS

## 2014-05-16 MED ORDER — HEPARIN SODIUM (PORCINE) 5000 UNIT/ML IJ SOLN
5000.0000 [IU] | Freq: Three times a day (TID) | INTRAMUSCULAR | Status: DC
  Administered 2014-05-16 – 2014-05-18 (×5): 5000 [IU] via SUBCUTANEOUS
  Filled 2014-05-16 (×8): qty 1

## 2014-05-16 MED ORDER — SODIUM CHLORIDE 0.9 % IV SOLN
INTRAVENOUS | Status: DC
  Administered 2014-05-16: 12:00:00 via INTRAVENOUS

## 2014-05-16 MED ORDER — FENTANYL CITRATE 0.05 MG/ML IJ SOLN
50.0000 ug | INTRAMUSCULAR | Status: AC | PRN
  Administered 2014-05-16 (×2): 50 ug via INTRAVENOUS
  Filled 2014-05-16 (×2): qty 2

## 2014-05-16 MED ORDER — ATORVASTATIN CALCIUM 20 MG PO TABS
20.0000 mg | ORAL_TABLET | Freq: Every day | ORAL | Status: DC
  Administered 2014-05-17 – 2014-05-18 (×2): 20 mg via ORAL
  Filled 2014-05-16 (×2): qty 1

## 2014-05-16 MED ORDER — GABAPENTIN 400 MG PO CAPS
400.0000 mg | ORAL_CAPSULE | Freq: Three times a day (TID) | ORAL | Status: DC
  Administered 2014-05-16 – 2014-05-18 (×6): 400 mg via ORAL
  Filled 2014-05-16 (×9): qty 1

## 2014-05-16 NOTE — ED Notes (Signed)
Patient returned from CT

## 2014-05-16 NOTE — ED Provider Notes (Signed)
CSN: [id number]     Arrival date & time 05/16/14  9509 History   First MD Initiated Contact with Patient 05/16/14 1104     Chief Complaint  Patient presents with  . Back Pain  . Emesis  . Diarrhea      HPI  Pt was seen at 1120. Per pt, c/o gradual onset and persistence of multiple intermittent episodes of N/V/D that began overnight last night. Describes the stools as "watery." Has been associated with generalized abd "pain."  Denies CP/SOB, no dysuria, no fevers, no black or blood in stools or emesis. Pt also c/o gradual onset and persistence of constant acute flair of her chronic low back and left leg "pain" for the past 3 days.  Denies any change in her usual chronic pain pattern.  Pain worsens with palpation of the area and body position changes. States she has been unable to take her usual chronic pain meds since yesterday "which made my pain worse." Pt states she is due to see a Neurosurgeon for her chronic back pain and lumbar radicular symptoms. Denies incont/retention of bowel or bladder, no saddle anesthesia, no focal motor weakness, no tingling/numbness in extremities, no fevers, no injury.      Past Medical History  Diagnosis Date  . Hypertension   . Spinal stenosis of lumbar region   . Asthma   . Tobacco abuse   . GERD (gastroesophageal reflux disease)   . Generalized headaches     ocasional, she uses naproxen.  . Chronic back pain   . Sciatica of left side   . Left lumbar radiculopathy   . Chronic leg pain     left   Past Surgical History  Procedure Laterality Date  . Cesarean section      x2  . Tracheostomy      when she was 20    History  Substance Use Topics  . Smoking status: Current Every Day Smoker -- 0.30 packs/day    Types: Cigarettes  . Smokeless tobacco: Never Used     Comment: will quitt this year  . Alcohol Use: No    Review of Systems ROS: Statement: All systems negative except as marked or noted in the HPI; Constitutional: Negative for fever  and chills. ; ; Eyes: Negative for eye pain, redness and discharge. ; ; ENMT: Negative for ear pain, hoarseness, nasal congestion, sinus pressure and sore throat. ; ; Cardiovascular: Negative for chest pain, palpitations, diaphoresis, dyspnea and peripheral edema. ; ; Respiratory: Negative for cough, wheezing and stridor. ; ; Gastrointestinal: +N/V/D, abd pain. Negative for blood in stool, hematemesis, jaundice and rectal bleeding. . ; ; Genitourinary: Negative for dysuria, flank pain and hematuria. ; ; Musculoskeletal: +chronic back and left leg pain. Negative for neck pain. Negative for swelling and trauma.; ; Skin: Negative for pruritus, rash, abrasions, blisters, bruising and skin lesion.; ; Neuro: Negative for headache, lightheadedness and neck stiffness. Negative for weakness, altered level of consciousness , altered mental status, extremity weakness, paresthesias, involuntary movement, seizure and syncope.     Allergies  Procaine hcl  Home Medications   Prior to Admission medications   Medication Sig Start Date End Date Taking? Authorizing Provider  albuterol (PROVENTIL HFA;VENTOLIN HFA) 108 (90 BASE) MCG/ACT inhaler Inhale into the lungs every 6 (six) hours as needed for wheezing or shortness of breath.   Yes Historical Provider, MD  atorvastatin (LIPITOR) 20 MG tablet Take 1 tablet (20 mg total) by mouth daily. 04/23/14  Yes Leeanne Rio,  MD  Calcium-Magnesium-Vitamin D (CITRACAL CALCIUM+D) 600-40-500 MG-MG-UNIT TB24 Take 1 tablet by mouth 2 (two) times daily.    Yes Historical Provider, MD  cyclobenzaprine (FLEXERIL) 10 MG tablet Take 1 tablet (10 mg total) by mouth 3 (three) times daily as needed for muscle spasms. 03/02/14  Yes Leeanne Rio, MD  gabapentin (NEURONTIN) 400 MG capsule Take 1 capsule (400 mg total) by mouth 3 (three) times daily. 04/23/14  Yes Leeanne Rio, MD  gabapentin (NEURONTIN) 800 MG tablet Take 1 tablet (800 mg total) by mouth 3 (three) times daily.  04/23/14  Yes Leeanne Rio, MD  ibuprofen (ADVIL,MOTRIN) 800 MG tablet Take 800 mg by mouth every 8 (eight) hours as needed for mild pain.   Yes Historical Provider, MD  lisinopril-hydrochlorothiazide (PRINZIDE,ZESTORETIC) 20-25 MG per tablet Take 0.5 tablets by mouth daily. 04/23/14  Yes Leeanne Rio, MD  morphine (MSIR) 15 MG tablet Take 1 tablet (15 mg total) by mouth 4 (four) times daily as needed for severe pain. Patient taking differently: Take 15 mg by mouth 3 (three) times daily.  04/22/14  Yes Leeanne Rio, MD   BP 91/69 mmHg  Pulse 128  Temp(Src) 97.4 F (36.3 C) (Oral)  Resp 18  SpO2 100%  Filed Vitals:   05/16/14 1200 05/16/14 1215 05/16/14 1225 05/16/14 1339  BP: 101/83 118/67 118/67 131/76  Pulse:  97 99 100  Temp:      TempSrc:      Resp: 15 14 19 11   SpO2:  100% 100% 100%     11:56:53 Orthostatic Vital Signs CG  Orthostatic Lying  - BP- Lying: 99/68 mmHg ; Pulse- Lying: 118  Orthostatic Sitting - BP- Sitting: 107/70 mmHg ; Pulse- Sitting: 121  Orthostatic Standing at 0 minutes - BP- Standing at 0 minutes: 75/58 mmHg ; Pulse- Standing at 0 minutes: 138     Physical Exam  1125: Physical examination:  Nursing notes reviewed; Vital signs and O2 SAT reviewed;  Constitutional: Well developed, Well nourished, In no acute distress; Head:  Normocephalic, atraumatic; Eyes: EOMI, PERRL, No scleral icterus; ENMT: Mouth and pharynx normal, Mucous membranes dry; Neck: Supple, Full range of motion, No lymphadenopathy; Cardiovascular: Tachycardic rate and rhythm, No gallop; Respiratory: Breath sounds clear & equal bilaterally, No wheezes.  Speaking full sentences with ease, Normal respiratory effort/excursion; Chest: Nontender, Movement normal; Abdomen: Soft, +diffuse tenderness to palp. No rebound or guarding. Nondistended, Normal bowel sounds; Genitourinary: No CVA tenderness; Spine:  No midline CS, TS, LS tenderness. +TTP left lumbar paraspinal muscles.;;  Extremities: Pulses normal, No tenderness, No edema, No calf edema or asymmetry.; Neuro: AA&Ox3, Major CN grossly intact.  Speech clear. Strength 5/5 equal bilat UE's and LE's. No gross focal motor or sensory deficits in extremities.; Skin: Color normal, Warm, Dry.   ED Course  Procedures     EKG Interpretation   Date/Time:  Saturday May 16 2014 11:29:16 EST Ventricular Rate:  118 PR Interval:  134 QRS Duration: 80 QT Interval:  322 QTC Calculation: 451 R Axis:   65 Text Interpretation:  Sinus tachycardia Biatrial enlargement When compared  with ECG of 04/14/2013 Rate faster Confirmed by [facility]  MD, Nunzio Cory  [phone]) on 05/16/2014 12:13:34 PM      MDM  MDM Reviewed: previous chart, nursing note and vitals Reviewed previous: labs, ECG and MRI Interpretation: labs, ECG, x-ray and CT scan Total time providing critical care: 30-74 minutes. This excludes time spent performing separately reportable procedures and services. Consults: admitting MD  CRITICAL CARE Performed by: Alfonzo Feller Total critical care time: 35 Critical care time was exclusive of separately billable procedures and treating other patients. Critical care was necessary to treat or prevent imminent or life-threatening deterioration. Critical care was time spent personally by me on the following activities: development of treatment plan with patient and/or surrogate as well as nursing, discussions with consultants, evaluation of patient's response to treatment, examination of patient, obtaining history from patient or surrogate, ordering and performing treatments and interventions, ordering and review of laboratory studies, ordering and review of radiographic studies, pulse oximetry and re-evaluation of patient's condition.  Results for orders placed or performed during the hospital encounter of 05/16/14  CBC with Differential  Result Value Ref Range   WBC 11.3 (H) 4.0 - 10.5 K/uL   RBC 5.39 (H)  3.87 - 5.11 MIL/uL   Hemoglobin 16.6 (H) 12.0 - 15.0 g/dL   HCT 47.6 (H) 36.0 - 46.0 %   MCV 88.3 78.0 - 100.0 fL   MCH 30.8 26.0 - 34.0 pg   MCHC 34.9 30.0 - 36.0 g/dL   RDW 13.3 11.5 - 15.5 %   Platelets 365 150 - 400 K/uL   Neutrophils Relative % 81 (H) 43 - 77 %   Neutro Abs 9.1 (H) 1.7 - 7.7 K/uL   Lymphocytes Relative 15 12 - 46 %   Lymphs Abs 1.7 0.7 - 4.0 K/uL   Monocytes Relative 4 3 - 12 %   Monocytes Absolute 0.4 0.1 - 1.0 K/uL   Eosinophils Relative 0 0 - 5 %   Eosinophils Absolute 0.1 0.0 - 0.7 K/uL   Basophils Relative 0 0 - 1 %   Basophils Absolute 0.0 0.0 - 0.1 K/uL  Comprehensive metabolic panel  Result Value Ref Range   Sodium 132 (L) 135 - 145 mmol/L   Potassium 4.1 3.5 - 5.1 mmol/L   Chloride 102 96 - 112 mEq/L   CO2 15 (L) 19 - 32 mmol/L   Glucose, Bld 144 (H) 70 - 99 mg/dL   BUN 18 6 - 23 mg/dL   Creatinine, Ser 1.92 (H) 0.50 - 1.10 mg/dL   Calcium 10.8 (H) 8.4 - 10.5 mg/dL   Total Protein 10.8 (H) 6.0 - 8.3 g/dL   Albumin 5.1 3.5 - 5.2 g/dL   AST 33 0 - 37 U/L   ALT 27 0 - 35 U/L   Alkaline Phosphatase 126 (H) 39 - 117 U/L   Total Bilirubin 0.2 (L) 0.3 - 1.2 mg/dL   GFR calc non Af Amer 26 (L) >90 mL/min   GFR calc Af Amer 31 (L) >90 mL/min   Anion gap 15 5 - 15  Urinalysis, Routine w reflex microscopic  Result Value Ref Range   Color, Urine AMBER (A) YELLOW   APPearance TURBID (A) CLEAR   Specific Gravity, Urine 1.023 1.005 - 1.030   pH 5.5 5.0 - 8.0   Glucose, UA NEGATIVE NEGATIVE mg/dL   Hgb urine dipstick MODERATE (A) NEGATIVE   Bilirubin Urine SMALL (A) NEGATIVE   Ketones, ur 15 (A) NEGATIVE mg/dL   Protein, ur >300 (A) NEGATIVE mg/dL   Urobilinogen, UA 0.2 0.0 - 1.0 mg/dL   Nitrite NEGATIVE NEGATIVE   Leukocytes, UA SMALL (A) NEGATIVE  Troponin I  Result Value Ref Range   Troponin I <0.03 <0.031 ng/mL  Lipase, blood  Result Value Ref Range   Lipase 93 (H) 11 - 59 U/L  Magnesium  Result Value Ref Range   Magnesium 2.6 (  H) 1.5 -  2.5 mg/dL  CK  Result Value Ref Range   Total CK 159 7 - 177 U/L  Urine microscopic-add on  Result Value Ref Range   Squamous Epithelial / LPF MANY (A) RARE   WBC, UA 3-6 <3 WBC/hpf   RBC / HPF 3-6 <3 RBC/hpf   Casts HYALINE CASTS (A) NEGATIVE   Urine-Other AMORPHOUS URATES/PHOSPHATES    Ct Abdomen Pelvis Wo Contrast 05/16/2014   CLINICAL DATA:  Diffuse abdominal pain, nausea and vomiting  EXAM: CT ABDOMEN AND PELVIS WITHOUT CONTRAST  TECHNIQUE: Multidetector CT imaging of the abdomen and pelvis was performed following the standard protocol without IV contrast.  COMPARISON:  Ultrasound 08/06/2010  FINDINGS: Sagittal images of the spine shows mild degenerative changes thoracolumbar spine. There is disc space flattening with vacuum disc phenomenon at L4-L5 level. Mild about 4 mm anterolisthesis L5 on S1 vertebral body. There is moderate disc bulge at L5-S1 level with spinal canal stenosis.  Atherosclerotic calcifications of abdominal aorta and iliac arteries are noted.  The gallbladder is contracted without calcified gallstones. There is a low density lesion in right hepatic lobe laterally measures 2.7 cm. This may represent the previous hemangioma seen on ultrasound. Confirmation with enhanced CT or MRI could be performed as clinically warranted. There is a cyst in lateral aspect of the left hepatic lobe measures 6.1 by 5.7 cm.  Heart size within normal limits.  The lung bases are unremarkable.  Unenhanced pancreas, spleen and right adrenal is unremarkable. There is diffuse low density thickening of the left adrenal gland measures about 2 cm. This may be due to benign at the adenoma or adrenal hypertrophy. Confirmation with enhanced CT or MRI is recommended.  Unenhanced kidneys are symmetrical in size. No hydronephrosis or hydroureter. Atherosclerotic calcifications of abdominal aorta and iliac arteries are noted.  There is small left paraumbilical ventral hernia containing fat measures 1.3 x 1.6 cm  without evidence of acute complication.  Oral contrast material was given to the patient. There is no evidence of gastric outlet obstruction. No small bowel obstruction. No ascites or free air.  Normal appendix partially visualized axial image 59 the appendix measures 4.5 mm in diameter.  No small bowel air-fluid levels. The terminal ileum is unremarkable. In axial image 42 there is mild thickening of the dependent wall in the mid jejunum. Mild prominence of jejunal mucosal folds. This is confirmed on coronal image 36. Nonspecific mild enteritis cannot be excluded. There are scattered nonenlarged mesenteric lymph nodes in mid mesentery the largest measures 6 mm. This may be reactive rather than lymphoproliferative disease. Few diverticula are noted in right colon. No evidence of acute diverticulitis. The colon is empty partially collapsed. No evidence of colonic obstruction. No definite evidence of colitis.  Unenhanced uterus and adnexa are unremarkable. No pelvic ascites or adenopathy. No inguinal adenopathy. Unenhanced kidneys are symmetrical in size. No nephrolithiasis. No hydronephrosis or hydroureter.  IMPRESSION: 1. In axial image 42 there is mild thickening of the dependent wall in the mid jejunum. Mild prominence of jejunal mucosal folds. This is confirmed on coronal image 36. Nonspecific mild enteritis cannot be excluded. There are scattered nonenlarged mesenteric lymph nodes in mid mesentery the largest measures 6 mm. This may be reactive rather than lymphoproliferative disease. 2. There is a low-density lesion in right hepatic lobe measures 2.7 cm. This cannot be characterized without contrast. A hemangioma was described on ultrasound at this level. Confirmation with enhanced CT or MRI could be performed as clinically  warranted. There is a cyst in lateral segment of left hepatic lobe measures 6.1 x 5.7 cm. 3. There is 2 cm adenoma or adrenal hypertrophy left adrenal gland. Confirmation with enhanced CT or  MRI is recommended. 4. Few diverticula are noted in right colon. No evidence of acute diverticulitis. 5. No pericecal inflammation.  Normal appendix. 6. Degenerative changes lumbar spine as described above.   Electronically Signed   By: Lahoma Crocker M.D.   On: 05/16/2014 14:12   Dg Chest 2 View 05/16/2014   CLINICAL DATA:  Back pain, left leg cramping  EXAM: CHEST  2 VIEW  COMPARISON:  02/13/2006  FINDINGS: Cardiomediastinal silhouette is stable. No acute infiltrate or pleural effusion. No pulmonary edema. Bony thorax is unremarkable.  IMPRESSION: No active cardiopulmonary disease.   Electronically Signed   By: Lahoma Crocker M.D.   On: 05/16/2014 13:49   Dg Lumbar Spine Complete 05/16/2014   CLINICAL DATA:  Left leg pain and cramping. She was told her left leg drags while walking. She states she woke up this way. She states diagnosed with herniated disc 2011.  EXAM: LUMBAR SPINE - COMPLETE 4+ VIEW  COMPARISON:  10/17/2010  FINDINGS: Examination demonstrates mild spondylosis throughout the lumbar spine. There is no evidence of compression fracture. There is a subtle grade 1 anterolisthesis of L5 with respect to L4 and S1. There is moderate facet arthropathy present. Possible disc space narrowing at the L3-4, L4-5 and L5-S1 levels. Calcified plaque present over the abdominal aorta and iliac arteries.  IMPRESSION: Mild spondylosis with mild disc disease from the L3-4 level to the L5-S1 level.  Subtle grade 1 anterolisthesis of L5 with respect L4 and S1 likely due to the moderate facet arthropathy.   Electronically Signed   By: Marin Olp M.D.   On: 05/16/2014 13:52    1415:  Judicious IVF boluses given for SBP 70's/HR 130's with improvement to SBP 130's/HR 100. New ARF on labs. CT/XR reassuring. No N/V or stooling while in the ED. Dx and testing d/w pt.  Questions answered.  Verb understanding, agreeable to admit.   T/C to [facility] Resident, case discussed, including:  HPI, pertinent PM/SHx, VS/PE, dx testing, ED  course and treatment:  Agreeable to admit, requests to write temporary orders, obtain tele bed to [facility]' service.   Francine Graven, DO 05/18/14 1235

## 2014-05-16 NOTE — ED Notes (Signed)
Pt taken to CT.

## 2014-05-16 NOTE — ED Notes (Addendum)
Patient returned from xray.

## 2014-05-16 NOTE — Progress Notes (Signed)
Pt arrived to the unit pt oriented to room, husband at the bedside. Morphine given for a HA. IV fluids started and educated on isolation precautions and full liquid diet.

## 2014-05-16 NOTE — H&P (Signed)
[facility] Admission History and Physical Service Pager: [phone]  Patient name: Raven [name] Medical record number: [id number] Date of birth: [birth year] Age: 63 y.o. Gender: female  Primary Care Provider: Chrisandra Netters, MD Consultants: None Code Status: Full  Chief Complaint: Vomiting, Diarrhea, Leg pain  Assessment and Plan: JAEDEN WESTBAY is a 63 y.o. female presenting with nausea, vomiting, diarrhea, and back/leg pain. PMH is significant for chronic pain (sciatica), HTN, HLD, and asthma.  # Dehydration. Secondary to vomiting and diarrhea. Patient noted to be tachycardic and orthostatic in ED. Vital signs improving with IVF. Symptoms most consistent with viral gastroenteritis (though patient has no known sick contacts). No fever, bloody diarrhea. or peritoneal signs to suggest a bacterial pathogen. Will proceed with symptom management. - IVF: NS @ 125cc/hr for 16 hours - Full liquid diet, ADAT - Zofran prn nausea/vomiting - f/u c diff pcr (can add immodium once negative)  # Acute Kidney Injury / Metabolic Acidosis / Electrolyte Abnormalities. Cr 1.92 on admission (baseline 0.7 to 0.9), Na 132 (baseline 132 to 137), bicarb 15. Likely secondary to dehydration. Anticipate improvement with IVF. - IVF as above - Avoid nephrotoxic agents - f/u AM BMP - Consider further work up if not improving with IVF  # Leg and back pain. Acute flare of chronic pain, likely exacerbated by dehydration and inability to tolerate PO. - IV morphine 4mg  while unable to tolerate PO, can transition to home regimen when tolerating PO - Continue home flexeril  # Intra-abdominal lesions. Abdominal CT with incidental findings of right hepatic lobe lesion and 2cm adenoma vs adrenal hypertrophy in left adrenal gland. - Consider abdominal contrast enhanced CT or MRI for further evaluation  # HTN. Holding home lisinopril-HCTZ in setting of dehydration and AKI - Restart as  indicated and once AKI is resolved  # HLD. Continue home statin  # Asthma. Controlled, Continue home albuterol  FEN/GI: Full liquid diet, ADAT, NS @ 125cc/hr Prophylaxis: SubQ heparin  Disposition: Admitted to telemetry under attending Dr Erin Hearing pending hydration and ability to tolerate PO.  History of Present Illness: Raven [name] is a 63 y.o. female presenting with nausea, vomiting, diarrhea and back/leg pain.  Patient states that all of her symptoms started around 4:00am. Patient woke up to use the restroom and noticed severe pain in her left leg. States that her left leg "locked up" and she was not able to use it due to pain. Patient has a history of chronic back pain and is currently on morphine IR and gabapentin. Neither of these medications has helped her leg pain. Patient also tried massaging her leg without relief. Pain is described as sharp and radiates from her left thigh into her foot.   Patient also reports at least 4 episodes of emesis and at least 5 episodes of diarrhea since this morning. Emesis is described as yellow in color without blood or coffee ground material. Stool is watery and yellow with no gross blood. Patient reports that she had some mild upper abdominal pain earlier, but none currently.  Endorses some chills, no fevers. Some shortness of breath this morning, none currently. No sick contacts. No family members with similar symptoms.   In the ED, patient was noted to by tachycardic (130s) and orthostatic (22 point drop in SBP and 20 bpm rise in HR from sitting to standing). Patient's HR improved to 100s with 500cc of NS. Patient was additionally given fentanyl without significant relief of her leg pain. Initial work  up was significant for hyponatremia (132), non-anion gap metabolic acidosis (bicarb 15), acute kidney injury (Cr 1.92), and a mild leukocytosis (11.3). CT abdomen revealed mild jejunal wall thickening consistent with possible mild enteritis.   Review  Of Systems: Per HPI, otherwise 12 point review of systems was performed and was unremarkable.  Patient Active Problem List   Diagnosis Date Noted  . Encounter for chronic pain management 04/23/2014  . Headache 01/11/2014  . Carpal tunnel syndrome 10/01/2013  . Pain on swallowing 06/26/2013  . Pre-syncope 04/16/2013  . Left leg pain 01/03/2013  . GERD (gastroesophageal reflux disease) 01/03/2013  . Left hip pain 03/26/2012  . Left shoulder pain 12/01/2011  . Insomnia 08/05/2011  . Constipation due to pain medication 08/05/2011  . Tooth pain 01/06/2011  . SCIATICA, LEFT 03/23/2010  . ASTHMA, INTERMITTENT 11/23/2009  . HYPERLIPIDEMIA 03/12/2008  . Tobacco abuse counseling 09/02/2007  . HYPERTENSION, BENIGN SYSTEMIC 07/19/2006   Past Medical History: Past Medical History  Diagnosis Date  . Hypertension   . Spinal stenosis of lumbar region   . Asthma   . Tobacco abuse   . GERD (gastroesophageal reflux disease)   . Generalized headaches     ocasional, she uses naproxen.  . Chronic back pain   . Sciatica of left side   . Left lumbar radiculopathy   . Chronic leg pain     left   Past Surgical History: Past Surgical History  Procedure Laterality Date  . Cesarean section      x2  . Tracheostomy      when she was 20   Social History: History  Substance Use Topics  . Smoking status: Current Every Day Smoker -- 0.30 packs/day    Types: Cigarettes  . Smokeless tobacco: Never Used     Comment: will quitt this year  . Alcohol Use: No   Additional social history: N/A Please also refer to relevant sections of EMR.  Family History: History reviewed. No pertinent family history. Allergies and Medications: Allergies  Allergen Reactions  . Procaine Hcl Hives and Rash   No current facility-administered medications on file prior to encounter.   Current Outpatient Prescriptions on File Prior to Encounter  Medication Sig Dispense Refill  . albuterol (PROVENTIL HFA;VENTOLIN  HFA) 108 (90 BASE) MCG/ACT inhaler Inhale into the lungs every 6 (six) hours as needed for wheezing or shortness of breath.    Marland Kitchen atorvastatin (LIPITOR) 20 MG tablet Take 1 tablet (20 mg total) by mouth daily. 90 tablet 3  . Calcium-Magnesium-Vitamin D (CITRACAL CALCIUM+D) 600-40-500 MG-MG-UNIT TB24 Take 1 tablet by mouth 2 (two) times daily.     . cyclobenzaprine (FLEXERIL) 10 MG tablet Take 1 tablet (10 mg total) by mouth 3 (three) times daily as needed for muscle spasms. 90 tablet 1  . gabapentin (NEURONTIN) 400 MG capsule Take 1 capsule (400 mg total) by mouth 3 (three) times daily. 270 capsule 1  . gabapentin (NEURONTIN) 800 MG tablet Take 1 tablet (800 mg total) by mouth 3 (three) times daily. 270 tablet 1  . ibuprofen (ADVIL,MOTRIN) 800 MG tablet Take 800 mg by mouth every 8 (eight) hours as needed for mild pain.    Marland Kitchen lisinopril-hydrochlorothiazide (PRINZIDE,ZESTORETIC) 20-25 MG per tablet Take 0.5 tablets by mouth daily. 45 tablet 3  . morphine (MSIR) 15 MG tablet Take 1 tablet (15 mg total) by mouth 4 (four) times daily as needed for severe pain. (Patient taking differently: Take 15 mg by mouth 3 (three) times daily. ) 120  tablet 0    Objective: BP 131/76 mmHg  Pulse 100  Temp(Src) 97.4 F (36.3 C) (Oral)  Resp 11  SpO2 100% Exam: General: NAD, lying in hospital bed HEENT: Mildly dry appearing mucus membranes Cardiovascular: Tachycardic, regular rhythm. 2/6 systolic murmur Respiratory: NWOB, CTAB Abdomen: +BS, mildly tender to palpation greatest in RLQ and periumbilical area, soft, nondistended, no rebound  Extremities: Lumbar back diffusely tender to palpation along spine, no step-offs or deformities, no edema or erythema. LLE diffusely tender to touch, no erythema or joint effusions. 5/5 strength in bilateral foot dorsi- and plantar flexion. DP 2+ bilaterally Skin: No rashes Neuro: Alert and oriented. Sensation intact grossly bilaterally. No focal deficits.   Labs and  Imaging: CBC BMET   Recent Labs Lab 05/16/14 1142  WBC 11.3*  HGB 16.6*  HCT 47.6*  PLT 365    Recent Labs Lab 05/16/14 1142  NA 132*  K 4.1  CL 102  CO2 15*  BUN 18  CREATININE 1.92*  GLUCOSE 144*  CALCIUM 10.8*     Urinalysis    Component Value Date/Time   COLORURINE AMBER* 05/16/2014 1353   APPEARANCEUR TURBID* 05/16/2014 1353   LABSPEC 1.023 05/16/2014 1353   PHURINE 5.5 05/16/2014 1353   GLUCOSEU NEGATIVE 05/16/2014 1353   HGBUR MODERATE* 05/16/2014 1353   BILIRUBINUR SMALL* 05/16/2014 1353   KETONESUR 15* 05/16/2014 1353   PROTEINUR >300* 05/16/2014 1353   UROBILINOGEN 0.2 05/16/2014 1353   NITRITE NEGATIVE 05/16/2014 1353   LEUKOCYTESUR SMALL* 05/16/2014 1353   Lipase 93 Troponin <0.03  EKG: Sinus tachycardia  Ct Abdomen Pelvis Wo Contrast  05/16/2014   CLINICAL DATA:  Diffuse abdominal pain, nausea and vomiting  EXAM: CT ABDOMEN AND PELVIS WITHOUT CONTRAST  TECHNIQUE: Multidetector CT imaging of the abdomen and pelvis was performed following the standard protocol without IV contrast.  COMPARISON:  Ultrasound 08/06/2010  FINDINGS: Sagittal images of the spine shows mild degenerative changes thoracolumbar spine. There is disc space flattening with vacuum disc phenomenon at L4-L5 level. Mild about 4 mm anterolisthesis L5 on S1 vertebral body. There is moderate disc bulge at L5-S1 level with spinal canal stenosis.  Atherosclerotic calcifications of abdominal aorta and iliac arteries are noted.  The gallbladder is contracted without calcified gallstones. There is a low density lesion in right hepatic lobe laterally measures 2.7 cm. This may represent the previous hemangioma seen on ultrasound. Confirmation with enhanced CT or MRI could be performed as clinically warranted. There is a cyst in lateral aspect of the left hepatic lobe measures 6.1 by 5.7 cm.  Heart size within normal limits.  The lung bases are unremarkable.  Unenhanced pancreas, spleen and right adrenal  is unremarkable. There is diffuse low density thickening of the left adrenal gland measures about 2 cm. This may be due to benign at the adenoma or adrenal hypertrophy. Confirmation with enhanced CT or MRI is recommended.  Unenhanced kidneys are symmetrical in size. No hydronephrosis or hydroureter. Atherosclerotic calcifications of abdominal aorta and iliac arteries are noted.  There is small left paraumbilical ventral hernia containing fat measures 1.3 x 1.6 cm without evidence of acute complication.  Oral contrast material was given to the patient. There is no evidence of gastric outlet obstruction. No small bowel obstruction. No ascites or free air.  Normal appendix partially visualized axial image 59 the appendix measures 4.5 mm in diameter.  No small bowel air-fluid levels. The terminal ileum is unremarkable. In axial image 42 there is mild thickening of the dependent  wall in the mid jejunum. Mild prominence of jejunal mucosal folds. This is confirmed on coronal image 36. Nonspecific mild enteritis cannot be excluded. There are scattered nonenlarged mesenteric lymph nodes in mid mesentery the largest measures 6 mm. This may be reactive rather than lymphoproliferative disease. Few diverticula are noted in right colon. No evidence of acute diverticulitis. The colon is empty partially collapsed. No evidence of colonic obstruction. No definite evidence of colitis.  Unenhanced uterus and adnexa are unremarkable. No pelvic ascites or adenopathy. No inguinal adenopathy. Unenhanced kidneys are symmetrical in size. No nephrolithiasis. No hydronephrosis or hydroureter.  IMPRESSION: 1. In axial image 42 there is mild thickening of the dependent wall in the mid jejunum. Mild prominence of jejunal mucosal folds. This is confirmed on coronal image 36. Nonspecific mild enteritis cannot be excluded. There are scattered nonenlarged mesenteric lymph nodes in mid mesentery the largest measures 6 mm. This may be reactive rather  than lymphoproliferative disease. 2. There is a low-density lesion in right hepatic lobe measures 2.7 cm. This cannot be characterized without contrast. A hemangioma was described on ultrasound at this level. Confirmation with enhanced CT or MRI could be performed as clinically warranted. There is a cyst in lateral segment of left hepatic lobe measures 6.1 x 5.7 cm. 3. There is 2 cm adenoma or adrenal hypertrophy left adrenal gland. Confirmation with enhanced CT or MRI is recommended. 4. Few diverticula are noted in right colon. No evidence of acute diverticulitis. 5. No pericecal inflammation.  Normal appendix. 6. Degenerative changes lumbar spine as described above.   Electronically Signed   By: Lahoma Crocker M.D.   On: 05/16/2014 14:12   Dg Chest 2 View  05/16/2014   CLINICAL DATA:  Back pain, left leg cramping  EXAM: CHEST  2 VIEW  COMPARISON:  02/13/2006  FINDINGS: Cardiomediastinal silhouette is stable. No acute infiltrate or pleural effusion. No pulmonary edema. Bony thorax is unremarkable.  IMPRESSION: No active cardiopulmonary disease.   Electronically Signed   By: Lahoma Crocker M.D.   On: 05/16/2014 13:49   Dg Lumbar Spine Complete  05/16/2014   CLINICAL DATA:  Left leg pain and cramping. She was told her left leg drags while walking. She states she woke up this way. She states diagnosed with herniated disc 2011.  EXAM: LUMBAR SPINE - COMPLETE 4+ VIEW  COMPARISON:  10/17/2010  FINDINGS: Examination demonstrates mild spondylosis throughout the lumbar spine. There is no evidence of compression fracture. There is a subtle grade 1 anterolisthesis of L5 with respect to L4 and S1. There is moderate facet arthropathy present. Possible disc space narrowing at the L3-4, L4-5 and L5-S1 levels. Calcified plaque present over the abdominal aorta and iliac arteries.  IMPRESSION: Mild spondylosis with mild disc disease from the L3-4 level to the L5-S1 level.  Subtle grade 1 anterolisthesis of L5 with respect L4 and S1  likely due to the moderate facet arthropathy.   Electronically Signed   By: Marin Olp M.D.   On: 05/16/2014 13:52    Dimas Chyle, MD 05/16/2014, 2:27 PM PGY-1, [place] Intern pager: [phone], text pages welcome  I have seen and examined Ms. Cumbo with dr. Jerline Pain and I agree with his documentation above. My annotations are in blue.   Laroy Apple, MD [place] Resident, PGY-3 05/16/2014, 5:16 PM

## 2014-05-16 NOTE — ED Notes (Signed)
Patient finished with contrast, CT notified. Waiting on CMP results.

## 2014-05-16 NOTE — ED Notes (Addendum)
Pt sts left leg pain and cramping. sts she was told when her left leg drags was told to come to the hospital. sts she woke up this way. Pt tachy at triage around 130. sts also vomiting and diarrhea.

## 2014-05-16 NOTE — ED Notes (Signed)
Pt ambulated from restroom with one standby assist.

## 2014-05-16 NOTE — ED Notes (Signed)
Unsuccessful at obtaining lab work from IV start. Phlebotomy notified.

## 2014-05-16 NOTE — ED Notes (Signed)
Attempted report 

## 2014-05-17 DIAGNOSIS — E871 Hypo-osmolality and hyponatremia: Secondary | ICD-10-CM | POA: Diagnosis present

## 2014-05-17 DIAGNOSIS — J4520 Mild intermittent asthma, uncomplicated: Secondary | ICD-10-CM | POA: Diagnosis present

## 2014-05-17 DIAGNOSIS — N179 Acute kidney failure, unspecified: Secondary | ICD-10-CM | POA: Diagnosis present

## 2014-05-17 DIAGNOSIS — Z79891 Long term (current) use of opiate analgesic: Secondary | ICD-10-CM | POA: Diagnosis not present

## 2014-05-17 DIAGNOSIS — I10 Essential (primary) hypertension: Secondary | ICD-10-CM | POA: Diagnosis present

## 2014-05-17 DIAGNOSIS — E860 Dehydration: Secondary | ICD-10-CM | POA: Diagnosis present

## 2014-05-17 DIAGNOSIS — E785 Hyperlipidemia, unspecified: Secondary | ICD-10-CM | POA: Diagnosis present

## 2014-05-17 DIAGNOSIS — Z791 Long term (current) use of non-steroidal anti-inflammatories (NSAID): Secondary | ICD-10-CM | POA: Diagnosis not present

## 2014-05-17 DIAGNOSIS — F17210 Nicotine dependence, cigarettes, uncomplicated: Secondary | ICD-10-CM | POA: Diagnosis present

## 2014-05-17 DIAGNOSIS — I959 Hypotension, unspecified: Secondary | ICD-10-CM | POA: Diagnosis present

## 2014-05-17 DIAGNOSIS — K219 Gastro-esophageal reflux disease without esophagitis: Secondary | ICD-10-CM | POA: Diagnosis present

## 2014-05-17 DIAGNOSIS — Z79899 Other long term (current) drug therapy: Secondary | ICD-10-CM | POA: Diagnosis not present

## 2014-05-17 DIAGNOSIS — R100 Acute abdomen: Secondary | ICD-10-CM

## 2014-05-17 DIAGNOSIS — R109 Unspecified abdominal pain: Secondary | ICD-10-CM | POA: Insufficient documentation

## 2014-05-17 DIAGNOSIS — G8929 Other chronic pain: Secondary | ICD-10-CM | POA: Diagnosis present

## 2014-05-17 DIAGNOSIS — E872 Acidosis: Secondary | ICD-10-CM | POA: Diagnosis present

## 2014-05-17 DIAGNOSIS — A084 Viral intestinal infection, unspecified: Secondary | ICD-10-CM | POA: Diagnosis present

## 2014-05-17 LAB — CBC
HEMATOCRIT: 38.5 % (ref 36.0–46.0)
Hemoglobin: 13 g/dL (ref 12.0–15.0)
MCH: 31 pg (ref 26.0–34.0)
MCHC: 33.8 g/dL (ref 30.0–36.0)
MCV: 91.7 fL (ref 78.0–100.0)
Platelets: 272 10*3/uL (ref 150–400)
RBC: 4.2 MIL/uL (ref 3.87–5.11)
RDW: 13.8 % (ref 11.5–15.5)
WBC: 7.2 10*3/uL (ref 4.0–10.5)

## 2014-05-17 LAB — URINALYSIS, ROUTINE W REFLEX MICROSCOPIC
Bilirubin Urine: NEGATIVE
Glucose, UA: NEGATIVE mg/dL
Ketones, ur: NEGATIVE mg/dL
Leukocytes, UA: NEGATIVE
Nitrite: NEGATIVE
Protein, ur: NEGATIVE mg/dL
SPECIFIC GRAVITY, URINE: 1.02 (ref 1.005–1.030)
UROBILINOGEN UA: 0.2 mg/dL (ref 0.0–1.0)
pH: 5 (ref 5.0–8.0)

## 2014-05-17 LAB — BASIC METABOLIC PANEL
Anion gap: 6 (ref 5–15)
BUN: 17 mg/dL (ref 6–23)
CO2: 19 mmol/L (ref 19–32)
CREATININE: 1.11 mg/dL — AB (ref 0.50–1.10)
Calcium: 8.8 mg/dL (ref 8.4–10.5)
Chloride: 109 mEq/L (ref 96–112)
GFR calc Af Amer: 59 mL/min — ABNORMAL LOW (ref >90)
GFR calc non Af Amer: 51 mL/min — ABNORMAL LOW (ref >90)
Glucose, Bld: 100 mg/dL — ABNORMAL HIGH (ref 70–99)
Potassium: 4.5 mmol/L (ref 3.5–5.1)
SODIUM: 134 mmol/L — AB (ref 135–145)

## 2014-05-17 LAB — URINE MICROSCOPIC-ADD ON

## 2014-05-17 MED ORDER — CEFTRIAXONE SODIUM IN DEXTROSE 20 MG/ML IV SOLN
1.0000 g | INTRAVENOUS | Status: DC
  Administered 2014-05-17: 1 g via INTRAVENOUS
  Filled 2014-05-17 (×2): qty 50

## 2014-05-17 MED ORDER — MORPHINE SULFATE 15 MG PO TABS
15.0000 mg | ORAL_TABLET | Freq: Three times a day (TID) | ORAL | Status: DC | PRN
  Administered 2014-05-17 (×2): 15 mg via ORAL
  Filled 2014-05-17 (×2): qty 1

## 2014-05-17 NOTE — Progress Notes (Signed)
UR Completed.  [phone]  

## 2014-05-17 NOTE — Progress Notes (Signed)
Patient continue to c/o headache and left sided pain, rating it a 8/10. Medicated with PRN Flexeril Po as per MD order. Will continue to monitor.  Esperanza Heir, RN

## 2014-05-17 NOTE — Progress Notes (Signed)
Patient c/o headache and left sided lower extremity pain. She describes the pain as an aching, throbbing sensation and rates it a 9/10. Medicated with 4 mg of Morphine IVP as per MD order along with 4 mg of Zofran IVP for c/o [place]. Will continue to monitor.  Esperanza Heir, RN

## 2014-05-17 NOTE — Progress Notes (Signed)
Patient sleeping peacefully at present time. Maintained on telemetry and is ST with a heart rate of 105. Will continue to monitor.  Esperanza Heir, RN

## 2014-05-17 NOTE — Progress Notes (Signed)
Family Medicine Teaching Service Daily Progress Note Intern Pager: [phone]  Patient name: Raven [name] Medical record number: [id number] Date of birth: January 08, 1951 Age: 63 y.o. Gender: female  Primary Care Provider: Chrisandra Netters, MD Consultants: None Code Status: Full  Pt Overview and Major Events to Date:  12/26 admitted for n/v/d  Assessment and Plan: Raven [name] is a 63 y.o. female presenting with nausea, vomiting, diarrhea, and back/leg pain. PMH is significant for chronic pain (sciatica), HTN, HLD, and asthma.  # Dehydration. Secondary to vomiting and diarrhea. Patient noted to be tachycardic and orthostatic in ED. Vital signs improving with IVF. Symptoms most consistent with viral gastroenteritis (though patient has no known sick contacts). No fever, bloody diarrhea. or peritoneal signs to suggest a bacterial pathogen.  - IVF: NS @ 125cc/hr, discontinue once fully tolerating PO - transition to regular diet - Zofran prn nausea/vomiting - C. Diff PCR needs to be collected  # Acute Kidney Injury / Metabolic Acidosis / Electrolyte Abnormalities. Cr 1.92 on admission (baseline 0.7 to 0.9), Na 132 (baseline 132 to 137), bicarb 15. Likely secondary to dehydration. SCr improved 1.92>>1.11 with IVF - continue fluids until tolerating good PO - Avoid nephrotoxic agents  # Leg and back pain. Acute flare of chronic pain, likely exacerbated by dehydration and inability to tolerate PO. - IV morphine 4mg  while unable to tolerate PO, can transition to home regimen when tolerating PO - Continue home flexeril  # Intra-abdominal lesions. Abdominal CT with incidental findings of right hepatic lobe lesion and 2cm adenoma vs adrenal hypertrophy in left adrenal gland. - Consider abdominal contrast enhanced CT or MRI for further evaluation vs outpt workup  # HTN. Holding home lisinopril-HCTZ in setting of dehydration and AKI. Remains slightly hypotensive - Restart as indicated and once AKI  is resolved  # HLD. Continue home statin  # Asthma. Controlled, Continue home albuterol  FEN/GI: Regular diet, NS @ 125cc/hr Prophylaxis: SubQ heparin  Disposition: pending clinical improvement  Subjective:  Still complains of back and left leg pain, some abdominal pain but improved. Still nauseas but no vomiting, last BM was around 2am and was not loose/diarrhea. She was out of bed this morning and did not feel dizzy, but did have some headache. Did not eat breakfast this morning except for ice cream because she didn't like the foods offered.  Objective: Temp:  [97.4 F (36.3 C)-98.2 F (36.8 C)] 98.2 F (36.8 C) (12/27 0602) Pulse Rate:  [72-131] 85 (12/27 0602) Resp:  [10-24] 18 (12/27 0602) BP: (91-144)/(57-83) 97/65 mmHg (12/27 0602) SpO2:  [98 %-100 %] 98 % (12/27 0602) Weight:  [161 lb 9.6 oz (73.301 kg)] 161 lb 9.6 oz (73.301 kg) (12/27 0602) Physical Exam: General: NAD, laying in bed HEENT: MM mildly dry Cardiovascular: RRR, normal s1s2 no m/r/g. 2+ radial and PT pulses Respiratory: clear bilaterally Abdomen: obese, mildly tender lower quadrants. Hyperactive bowel sounds on the right. Midline vertical c/s incision well healed. Extremities: no edema or cyanosis.  Laboratory:  Recent Labs Lab 05/16/14 1142  WBC 11.3*  HGB 16.6*  HCT 47.6*  PLT 365    Recent Labs Lab 05/16/14 1142 05/17/14 0425  NA 132* 134*  K 4.1 4.5  CL 102 109  CO2 15* 19  BUN 18 17  CREATININE 1.92* 1.11*  CALCIUM 10.8* 8.8  PROT 10.8*  --   BILITOT 0.2*  --   ALKPHOS 126*  --   ALT 27  --   AST 33  --  GLUCOSE 144* 100*   Imaging/Diagnostic Tests: 12/26 CT abd/pelv w/o contrast IMPRESSION: 1. In axial image 42 there is mild thickening of the dependent wall in the mid jejunum. Mild prominence of jejunal mucosal folds. This is confirmed on coronal image 36. Nonspecific mild enteritis cannot be excluded. There are scattered nonenlarged mesenteric lymph nodes in mid mesentery  the largest measures 6 mm. This may be reactive rather than lymphoproliferative disease. 2. There is a low-density lesion in right hepatic lobe measures 2.7 cm. This cannot be characterized without contrast. A hemangioma was described on ultrasound at this level. Confirmation with enhanced CT or MRI could be performed as clinically warranted. There is a cyst in lateral segment of left hepatic lobe measures 6.1 x 5.7 cm. 3. There is 2 cm adenoma or adrenal hypertrophy left adrenal gland. Confirmation with enhanced CT or MRI is recommended. 4. Few diverticula are noted in right colon. No evidence of acute diverticulitis. 5. No pericecal inflammation. Normal appendix. 6. Degenerative changes lumbar spine as described above.  Leone Brand, MD 05/17/2014, 8:28 AM PGY-2, [place] Intern pager: [phone], text pages welcome

## 2014-05-17 NOTE — Progress Notes (Signed)
Pt has not had any diarrhea since yesterday morning will cont with precautions and re-eval. Spoke with Md regarding tele will remain on tele today and monitor.

## 2014-05-18 DIAGNOSIS — A084 Viral intestinal infection, unspecified: Secondary | ICD-10-CM | POA: Insufficient documentation

## 2014-05-18 LAB — BASIC METABOLIC PANEL
Anion gap: 6 (ref 5–15)
BUN: 13 mg/dL (ref 6–23)
CO2: 19 mmol/L (ref 19–32)
CREATININE: 0.77 mg/dL (ref 0.50–1.10)
Calcium: 8.3 mg/dL — ABNORMAL LOW (ref 8.4–10.5)
Chloride: 114 mEq/L — ABNORMAL HIGH (ref 96–112)
GFR, EST NON AFRICAN AMERICAN: 87 mL/min — AB (ref >90)
Glucose, Bld: 93 mg/dL (ref 70–99)
POTASSIUM: 4 mmol/L (ref 3.5–5.1)
Sodium: 139 mmol/L (ref 135–145)

## 2014-05-18 NOTE — Discharge Instructions (Signed)
You were admitted for IV fluids due to your vomiting and dehydration. While here, we gave you IV fluids and monitored your blood counts and electrolytes. Your diarrhea stopped while you were here and you were able to drink adequate amounts of fluid without needing IV fluids. It is important that you follow up with your primary doctor.   Viral Gastroenteritis Viral gastroenteritis is also known as stomach flu. This condition affects the stomach and intestinal tract. It can cause sudden diarrhea and vomiting. The illness typically lasts 3 to 8 days. Most people develop an immune response that eventually gets rid of the virus. While this natural response develops, the virus can make you quite ill. CAUSES  Many different viruses can cause gastroenteritis, such as rotavirus or noroviruses. You can catch one of these viruses by consuming contaminated food or water. You may also catch a virus by sharing utensils or other personal items with an infected person or by touching a contaminated surface. SYMPTOMS  The most common symptoms are diarrhea and vomiting. These problems can cause a severe loss of body fluids (dehydration) and a body salt (electrolyte) imbalance. Other symptoms may include:  Fever.  Headache.  Fatigue.  Abdominal pain. DIAGNOSIS  Your caregiver can usually diagnose viral gastroenteritis based on your symptoms and a physical exam. A stool sample may also be taken to test for the presence of viruses or other infections. TREATMENT  This illness typically goes away on its own. Treatments are aimed at rehydration. The most serious cases of viral gastroenteritis involve vomiting so severely that you are not able to keep fluids down. In these cases, fluids must be given through an intravenous line (IV). HOME CARE INSTRUCTIONS   Drink enough fluids to keep your urine clear or pale yellow. Drink small amounts of fluids frequently and increase the amounts as tolerated.  Ask your caregiver  for specific rehydration instructions.  Avoid:  Foods high in sugar.  Alcohol.  Carbonated drinks.  Tobacco.  Juice.  Caffeine drinks.  Extremely hot or cold fluids.  Fatty, greasy foods.  Too much intake of anything at one time.  Dairy products until 24 to 48 hours after diarrhea stops.  You may consume probiotics. Probiotics are active cultures of beneficial bacteria. They may lessen the amount and number of diarrheal stools in adults. Probiotics can be found in yogurt with active cultures and in supplements.  Wash your hands well to avoid spreading the virus.  Only take over-the-counter or prescription medicines for pain, discomfort, or fever as directed by your caregiver. Do not give aspirin to children. Antidiarrheal medicines are not recommended.  Ask your caregiver if you should continue to take your regular prescribed and over-the-counter medicines.  Keep all follow-up appointments as directed by your caregiver. SEEK IMMEDIATE MEDICAL CARE IF:   You are unable to keep fluids down.  You do not urinate at least once every 6 to 8 hours.  You develop shortness of breath.  You notice blood in your stool or vomit. This may look like coffee grounds.  You have abdominal pain that increases or is concentrated in one small area (localized).  You have persistent vomiting or diarrhea.  You have a fever.  The patient is a child younger than 3 months, and he or she has a fever.  The patient is a child older than 3 months, and he or she has a fever and persistent symptoms.  The patient is a child older than 3 months, and he or she  has a fever and symptoms suddenly get worse.  The patient is a baby, and he or she has no tears when crying. MAKE SURE YOU:   Understand these instructions.  Will watch your condition.  Will get help right away if you are not doing well or get worse. Document Released: 05/08/2005 Document Revised: 07/31/2011 Document Reviewed:  02/22/2011 [facility] Patient Information 2015 [place], [place]. This information is not intended to replace advice given to you by your health care provider. Make sure you discuss any questions you have with your health care provider.

## 2014-05-18 NOTE — Progress Notes (Signed)
Patient 's contact isolation discontinued per protocol as she has not had a loose bowel movement in over 48 hours.  Esperanza Heir, RN

## 2014-05-18 NOTE — Care Management Note (Signed)
    Page 1 of 1   05/18/2014     3:30:04 PM CARE MANAGEMENT NOTE 05/18/2014  Patient:  Raven [name], Raven [name]   Account Number:  [id number]  Date Initiated:  05/18/2014  Documentation initiated by:  Dasja Brase  Subjective/Objective Assessment:   ARF  New lesion on Liver     Action/Plan:   CM to follow for disposition needs   Anticipated DC Date:  05/18/2014   Anticipated DC Plan:  HOME/SELF CARE         Choice offered to / List presented to:             Status of service:  Completed, signed off Medicare Important Message given?  YES (If response is "NO", the following Medicare IM given date fields will be blank) Date Medicare IM given:  05/18/2014 Medicare IM given by:  Rafaella Kole Date Additional Medicare IM given:   Additional Medicare IM given by:    Discharge Disposition:  HOME/SELF CARE  Per UR Regulation:  Reviewed for med. necessity/level of care/duration of stay  If discussed at [place] of Stay Meetings, dates discussed:    Comments:

## 2014-05-18 NOTE — Discharge Summary (Signed)
[facility] Discharge Summary  Patient name: Raven [name] Medical record number: [id number] Date of birth: Apr 02, 1951 Age: 63 y.o. Gender: female Date of Admission: 05/16/2014  Date of Discharge:05/18/2014  Admitting Physician: Lind Covert, MD  Primary Care Provider: Chrisandra Netters, MD Consultants: None  Indication for Hospitalization: Dehydration 2/2 gastroenteritis   Discharge Diagnoses/Problem List:  Dehydration, Acute Kidney Injury, Chronic leg/back pain, HTN, HLD, asthma, intra-abdominal lesions  Disposition: Home  Discharge Condition: Improved  Discharge Exam: Please see progress note for day of discharge  Brief Hospital Course:  Raven [name] is a 63 year old female who presented with nausea, vomiting, diarrhea, and acute on chronic back/leg pain. Her PMH is significant for chronic pain (sciatica), HTN, HLD, and asthma.  Patient presented with acute kidney injury (Cr 1.92, baseline 0.7 to 0.9) metabolic acidosis and hyponatremia, all secondary to dehydration due to likely gastroenteritis. Patient had no fevers, bloody diarrhea, or peritoneal signs to suggest a bacterial etiology. Patient was managed symptomatically with zofran and IVF. Her AKI and electrolyte abnormalities resolved with fluid resuscitation. Her symptoms rapidly improved, and by the time of discharge she was able to tolerate PO intake with no nausea, vomiting, or diarrhea.   Her home blood pressure medications were held in the setting of AKI and relative hypotension. We did not restart these medications on discharge as she continued to have low-normal pressures.   Of note, abdominal CT obtained in the ED was consistent with gastroenteritis, however additionally showed incidental findings of a 2.7cm lesion in her right hepatic lobe and a 2cm adenoma vs adrenal hypertrophy on her left adrenal gland.  Issues for Follow Up:  1) f/u BP - held home lisinopril-HCTZ in setting  of AKI and relative hypotension here 2) Consider further evaluation for incidental findings on abdominal CT (consider screening for pheochromocytoma, Cushing's, and hyperaldosteronism) 3) f/u urine culture - patient with questionable UA and given 1 dose of rocephin while here, though follow up UA was unremarkable  Significant Procedures: None  Significant Labs and Imaging:   Recent Labs Lab 05/16/14 1142 05/17/14 0855  WBC 11.3* 7.2  HGB 16.6* 13.0  HCT 47.6* 38.5  PLT 365 272    Recent Labs Lab 05/16/14 1142 05/17/14 0425 05/18/14 0712  NA 132* 134* 139  K 4.1 4.5 4.0  CL 102 109 114*  CO2 15* 19 19  GLUCOSE 144* 100* 93  BUN 18 17 13   CREATININE 1.92* 1.11* 0.77  CALCIUM 10.8* 8.8 8.3*  MG 2.6*  --   --   ALKPHOS 126*  --   --   AST 33  --   --   ALT 27  --   --   ALBUMIN 5.1  --   --    Urinalysis    Component Value Date/Time   COLORURINE YELLOW 05/17/2014 [place] 05/17/2014 1652   LABSPEC 1.020 05/17/2014 1652   PHURINE 5.0 05/17/2014 1652   GLUCOSEU NEGATIVE 05/17/2014 1652   HGBUR SMALL* 05/17/2014 1652   BILIRUBINUR NEGATIVE 05/17/2014 1652   KETONESUR NEGATIVE 05/17/2014 1652   PROTEINUR NEGATIVE 05/17/2014 1652   UROBILINOGEN 0.2 05/17/2014 1652   NITRITE NEGATIVE 05/17/2014 1652   LEUKOCYTESUR NEGATIVE 05/17/2014 1652    Ct Abdomen Pelvis Wo Contrast  05/16/2014   CLINICAL DATA:  Diffuse abdominal pain, nausea and vomiting  EXAM: CT ABDOMEN AND PELVIS WITHOUT CONTRAST  TECHNIQUE: Multidetector CT imaging of the abdomen and pelvis was performed following the standard  protocol without IV contrast.  COMPARISON:  Ultrasound 08/06/2010  FINDINGS: Sagittal images of the spine shows mild degenerative changes thoracolumbar spine. There is disc space flattening with vacuum disc phenomenon at L4-L5 level. Mild about 4 mm anterolisthesis L5 on S1 vertebral body. There is moderate disc bulge at L5-S1 level with spinal canal stenosis.   Atherosclerotic calcifications of abdominal aorta and iliac arteries are noted.  The gallbladder is contracted without calcified gallstones. There is a low density lesion in right hepatic lobe laterally measures 2.7 cm. This may represent the previous hemangioma seen on ultrasound. Confirmation with enhanced CT or MRI could be performed as clinically warranted. There is a cyst in lateral aspect of the left hepatic lobe measures 6.1 by 5.7 cm.  Heart size within normal limits.  The lung bases are unremarkable.  Unenhanced pancreas, spleen and right adrenal is unremarkable. There is diffuse low density thickening of the left adrenal gland measures about 2 cm. This may be due to benign at the adenoma or adrenal hypertrophy. Confirmation with enhanced CT or MRI is recommended.  Unenhanced kidneys are symmetrical in size. No hydronephrosis or hydroureter. Atherosclerotic calcifications of abdominal aorta and iliac arteries are noted.  There is small left paraumbilical ventral hernia containing fat measures 1.3 x 1.6 cm without evidence of acute complication.  Oral contrast material was given to the patient. There is no evidence of gastric outlet obstruction. No small bowel obstruction. No ascites or free air.  Normal appendix partially visualized axial image 59 the appendix measures 4.5 mm in diameter.  No small bowel air-fluid levels. The terminal ileum is unremarkable. In axial image 42 there is mild thickening of the dependent wall in the mid jejunum. Mild prominence of jejunal mucosal folds. This is confirmed on coronal image 36. Nonspecific mild enteritis cannot be excluded. There are scattered nonenlarged mesenteric lymph nodes in mid mesentery the largest measures 6 mm. This may be reactive rather than lymphoproliferative disease. Few diverticula are noted in right colon. No evidence of acute diverticulitis. The colon is empty partially collapsed. No evidence of colonic obstruction. No definite evidence of  colitis.  Unenhanced uterus and adnexa are unremarkable. No pelvic ascites or adenopathy. No inguinal adenopathy. Unenhanced kidneys are symmetrical in size. No nephrolithiasis. No hydronephrosis or hydroureter.  IMPRESSION: 1. In axial image 42 there is mild thickening of the dependent wall in the mid jejunum. Mild prominence of jejunal mucosal folds. This is confirmed on coronal image 36. Nonspecific mild enteritis cannot be excluded. There are scattered nonenlarged mesenteric lymph nodes in mid mesentery the largest measures 6 mm. This may be reactive rather than lymphoproliferative disease. 2. There is a low-density lesion in right hepatic lobe measures 2.7 cm. This cannot be characterized without contrast. A hemangioma was described on ultrasound at this level. Confirmation with enhanced CT or MRI could be performed as clinically warranted. There is a cyst in lateral segment of left hepatic lobe measures 6.1 x 5.7 cm. 3. There is 2 cm adenoma or adrenal hypertrophy left adrenal gland. Confirmation with enhanced CT or MRI is recommended. 4. Few diverticula are noted in right colon. No evidence of acute diverticulitis. 5. No pericecal inflammation.  Normal appendix. 6. Degenerative changes lumbar spine as described above.   Electronically Signed   By: Lahoma Crocker M.D.   On: 05/16/2014 14:12   Dg Chest 2 View  05/16/2014   CLINICAL DATA:  Back pain, left leg cramping  EXAM: CHEST  2 VIEW  COMPARISON:  02/13/2006  FINDINGS: Cardiomediastinal silhouette is stable. No acute infiltrate or pleural effusion. No pulmonary edema. Bony thorax is unremarkable.  IMPRESSION: No active cardiopulmonary disease.   Electronically Signed   By: Lahoma Crocker M.D.   On: 05/16/2014 13:49   Dg Lumbar Spine Complete  05/16/2014   CLINICAL DATA:  Left leg pain and cramping. She was told her left leg drags while walking. She states she woke up this way. She states diagnosed with herniated disc 2011.  EXAM: LUMBAR SPINE - COMPLETE 4+  VIEW  COMPARISON:  10/17/2010  FINDINGS: Examination demonstrates mild spondylosis throughout the lumbar spine. There is no evidence of compression fracture. There is a subtle grade 1 anterolisthesis of L5 with respect to L4 and S1. There is moderate facet arthropathy present. Possible disc space narrowing at the L3-4, L4-5 and L5-S1 levels. Calcified plaque present over the abdominal aorta and iliac arteries.  IMPRESSION: Mild spondylosis with mild disc disease from the L3-4 level to the L5-S1 level.  Subtle grade 1 anterolisthesis of L5 with respect L4 and S1 likely due to the moderate facet arthropathy.   Electronically Signed   By: Marin Olp M.D.   On: 05/16/2014 13:52   Results/Tests Pending at Time of Discharge: Urine Culture  Discharge Medications:    Medication List    STOP taking these medications        lisinopril-hydrochlorothiazide 20-25 MG per tablet  Commonly known as:  PRINZIDE,ZESTORETIC      TAKE these medications        albuterol 108 (90 BASE) MCG/ACT inhaler  Commonly known as:  PROVENTIL HFA;VENTOLIN HFA  Inhale into the lungs every 6 (six) hours as needed for wheezing or shortness of breath.     atorvastatin 20 MG tablet  Commonly known as:  LIPITOR  Take 1 tablet (20 mg total) by mouth daily.     CITRACAL CALCIUM+D 600-40-500 MG-MG-UNIT Tb24  Generic drug:  Calcium-Magnesium-Vitamin D  Take 1 tablet by mouth 2 (two) times daily.     cyclobenzaprine 10 MG tablet  Commonly known as:  FLEXERIL  Take 1 tablet (10 mg total) by mouth 3 (three) times daily as needed for muscle spasms.     gabapentin 400 MG capsule  Commonly known as:  NEURONTIN  Take 1 capsule (400 mg total) by mouth 3 (three) times daily.     ibuprofen 800 MG tablet  Commonly known as:  ADVIL,MOTRIN  Take 800 mg by mouth every 8 (eight) hours as needed for mild pain.     morphine 15 MG tablet  Commonly known as:  MSIR  Take 1 tablet (15 mg total) by mouth 4 (four) times daily as needed  for severe pain.        Discharge Instructions: Please refer to Patient Instructions section of EMR for full details.  Patient was counseled important signs and symptoms that should prompt return to medical care, changes in medications, dietary instructions, activity restrictions, and follow up appointments.   Follow-Up Appointments: Follow-up Information    Follow up with Chrisandra Netters, MD On 06/01/2014.   Specialty:  Family Medicine   Why:  1:45pm, for hospital follow up   Contact information:   [place] [address] [phone]       Dimas Chyle, MD 05/18/2014, 1:41 PM PGY-1, [place]

## 2014-05-18 NOTE — Progress Notes (Signed)
Family Medicine Teaching Service Daily Progress Note Intern Pager: [phone]  Patient name: Raven [name] Medical record number: [id number] Date of birth: 1950/10/17 Age: 63 y.o. Gender: female  Primary Care Provider: Chrisandra Netters, MD Consultants: None Code Status: Full  Pt Overview and Major Events to Date:  12/26 admitted for n/v/d  Assessment and Plan: Raven [name] is a 63 y.o. female presenting with nausea, vomiting, diarrhea, and back/leg pain. PMH is significant for chronic pain (sciatica), HTN, HLD, and asthma.  # Dehydration. Secondary to vomiting and diarrhea. Patient noted to be tachycardic and orthostatic in ED. Vital signs improving with IVF. Symptoms most consistent with viral gastroenteritis (though patient has no known sick contacts). No fever, bloody diarrhea. or peritoneal signs to suggest a bacterial pathogen.  - IVF: KVO - Regular diet - Zofran prn nausea/vomiting  # Acute Kidney Injury / Metabolic Acidosis / Electrolyte Abnormalities. Cr 1.92 on admission (baseline 0.7 to 0.9), Na 132 (baseline 132 to 137), bicarb 15. Likely secondary to dehydration.  - Improving with IVF - Avoid nephrotoxic agents  # Leg and back pain. Acute flare of chronic pain, likely exacerbated by dehydration and inability to tolerate PO. - Home PO morphine IR - Continue home flexeril  # Intra-abdominal lesions. Abdominal CT with incidental findings of right hepatic lobe lesion and 2cm adenoma vs adrenal hypertrophy in left adrenal gland. - Consider abdominal contrast enhanced CT or MRI for further evaluation vs outpt workup  # HTN. Holding home lisinopril-HCTZ in setting of dehydration and AKI. Remains slightly hypotensive - Restart as indicated and once AKI is resolved  # HLD. Continue home statin  # Asthma. Controlled, Continue home albuterol  FEN/GI: Regular diet, KVO IVF Prophylaxis: SubQ heparin  Disposition: Anticipate discharge later today.   Subjective:  Doing  much better this morning. Ready for discharge. Able to eat and drink without issues. No vomiting or diarrhea.   Objective: Temp:  [97.9 F (36.6 C)-98 F (36.7 C)] 97.9 F (36.6 C) (12/28 1478) Pulse Rate:  [80-85] 80 (12/28 0621) Resp:  [16-18] 17 (12/28 0621) BP: (104-112)/(53-63) 104/63 mmHg (12/28 0621) SpO2:  [98 %-100 %] 100 % (12/28 0621) Weight:  [166 lb 14.2 oz (75.7 kg)] 166 lb 14.2 oz (75.7 kg) (12/28 2956) Physical Exam: General: NAD, laying in bed HEENT: MM mildly dry Cardiovascular: RRR, normal s1s2 no m/r/g. 2+ radial and PT pulses Respiratory: clear bilaterally Abdomen: obese, mildly tender lower quadrants. Hyperactive bowel sounds on the right. Midline vertical c/s incision well healed. Extremities: no edema or cyanosis.  Laboratory:  Recent Labs Lab 05/16/14 1142 05/17/14 0855  WBC 11.3* 7.2  HGB 16.6* 13.0  HCT 47.6* 38.5  PLT 365 272    Recent Labs Lab 05/16/14 1142 05/17/14 0425 05/18/14 0712  NA 132* 134* 139  K 4.1 4.5 4.0  CL 102 109 114*  CO2 15* 19 19  BUN 18 17 13   CREATININE 1.92* 1.11* 0.77  CALCIUM 10.8* 8.8 8.3*  PROT 10.8*  --   --   BILITOT 0.2*  --   --   ALKPHOS 126*  --   --   ALT 27  --   --   AST 33  --   --   GLUCOSE 144* 100* 93   Imaging/Diagnostic Tests: None New  Dimas Chyle, MD 05/18/2014, 9:11 AM PGY-1, [place] Intern pager: [phone], text pages welcome

## 2014-05-19 LAB — URINE CULTURE: Colony Count: 10000

## 2014-06-01 ENCOUNTER — Encounter: Payer: Self-pay | Admitting: Family Medicine

## 2014-06-01 ENCOUNTER — Ambulatory Visit (INDEPENDENT_AMBULATORY_CARE_PROVIDER_SITE_OTHER): Payer: Commercial Managed Care - HMO | Admitting: Family Medicine

## 2014-06-01 VITALS — BP 125/90 | HR 77 | Temp 98.1°F | Ht 62.0 in | Wt 163.0 lb

## 2014-06-01 DIAGNOSIS — E279 Disorder of adrenal gland, unspecified: Secondary | ICD-10-CM

## 2014-06-01 DIAGNOSIS — N179 Acute kidney failure, unspecified: Secondary | ICD-10-CM

## 2014-06-01 DIAGNOSIS — K769 Liver disease, unspecified: Secondary | ICD-10-CM

## 2014-06-01 DIAGNOSIS — E278 Other specified disorders of adrenal gland: Secondary | ICD-10-CM

## 2014-06-01 DIAGNOSIS — I10 Essential (primary) hypertension: Secondary | ICD-10-CM

## 2014-06-01 DIAGNOSIS — K7689 Other specified diseases of liver: Secondary | ICD-10-CM

## 2014-06-01 DIAGNOSIS — Z7189 Other specified counseling: Secondary | ICD-10-CM

## 2014-06-01 DIAGNOSIS — G8929 Other chronic pain: Secondary | ICD-10-CM

## 2014-06-01 LAB — BASIC METABOLIC PANEL
BUN: 12 mg/dL (ref 6–23)
CO2: 22 mEq/L (ref 19–32)
Calcium: 9.5 mg/dL (ref 8.4–10.5)
Chloride: 102 mEq/L (ref 96–112)
Creat: 0.79 mg/dL (ref 0.50–1.10)
GLUCOSE: 88 mg/dL (ref 70–99)
Potassium: 4.5 mEq/L (ref 3.5–5.3)
SODIUM: 133 meq/L — AB (ref 135–145)

## 2014-06-01 MED ORDER — MORPHINE SULFATE 15 MG PO TABS: 15.0000 mg | ORAL_TABLET | Freq: Four times a day (QID) | ORAL | Status: DC | PRN

## 2014-06-01 NOTE — Patient Instructions (Signed)
It was great to see you again today!  I refilled your morphine. We'll look into the Neurosurgery referral again Getting CT scan with contrast to follow up on the CT you had in the hospital. Checking kidney function again today  Follow up with me in 1 month.  Be well, Dr. [name]

## 2014-06-02 ENCOUNTER — Encounter: Payer: Self-pay | Admitting: Family Medicine

## 2014-06-04 ENCOUNTER — Ambulatory Visit (HOSPITAL_COMMUNITY): Payer: Commercial Managed Care - HMO

## 2014-06-07 ENCOUNTER — Telehealth: Payer: Self-pay | Admitting: Family Medicine

## 2014-06-07 DIAGNOSIS — E278 Other specified disorders of adrenal gland: Secondary | ICD-10-CM | POA: Insufficient documentation

## 2014-06-07 NOTE — Assessment & Plan Note (Signed)
BP acceptable at 125/90, continue current regimen. Checking BMET today.

## 2014-06-07 NOTE — Assessment & Plan Note (Signed)
Refill morphine today. Need to discuss decreasing back down to regular amount of 90 pills per month at next office visit. Will also have staff f/u on her referral to neurosurgery as she has not yet been contacted about this. F/u with me in 1 mo.

## 2014-06-07 NOTE — Assessment & Plan Note (Signed)
Discussed need for f/u imaging with patient today for L adrenal mass and hepatic lesion. She is agreeable to CT abd pelvis with contrast to further evaluate. Prefers not to get an MRI due to length of time the test requires. Order entered today. Will also check BMET today to ensure stabilization of renal fxn.

## 2014-06-07 NOTE — Progress Notes (Signed)
Patient ID: Raven [name], female   DOB: Jul 06, 1950, 64 y.o.   MRN: [id number]  HPI:  Hospital f/u: was admitted with n/v/d and AKI, both presumed to be due to gastroenteritis. Improved with IVF. She did have a CT scan which showed a 2.7cm lesion on R hepatic lobe & 2cm adenoma vs adrenal hypertrophy on L adrenal gland. Her home BP meds were held due to normal BP's in hospital, but pt tells me today that she is still taking her lisinopril-HCTZ.   Chronic back/leg pain: taking morphine 4 times a day. This helps her a lot. Worse with weather changes. Previously we had referred her to neurosurgery but she has not yet heard about this appointment. Needs refill on morphine today. No problems with urine or stooling, no leg weakness.  ROS: See HPI.  [place]: hx asthma, GERD, HLD, HTN, tobacco abuse  PHYSICAL EXAM: BP 125/90 mmHg  Pulse 77  Temp(Src) 98.1 F (36.7 C) (Oral)  Ht 5\' 2"  (1.575 m)  Wt 163 lb (73.936 kg)  BMI 29.81 kg/m2 Gen: NAD, pleasant, cooperative HEENT: NCAT Heart: RRR no murmurs Lungs: CTAB, NWOB Abd: soft, NTTP Neuro: grossly nonfocal, speech normal Ext: full strength bilat lower ext. No back TTP  ASSESSMENT/PLAN:   Encounter for chronic pain management Refill morphine today. Need to discuss decreasing back down to regular amount of 90 pills per month at next office visit. Will also have staff f/u on her referral to neurosurgery as she has not yet been contacted about this. F/u with me in 1 mo.   Adrenal mass, left Discussed need for f/u imaging with patient today for L adrenal mass and hepatic lesion. She is agreeable to CT abd pelvis with contrast to further evaluate. Prefers not to get an MRI due to length of time the test requires. Order entered today. Will also check BMET today to ensure stabilization of renal fxn.   HYPERTENSION, BENIGN SYSTEMIC BP acceptable at 125/90, continue current regimen. Checking BMET today.    FOLLOW UP: F/u in 1 mo for chronic  pain  [place] J. [name], [place]

## 2014-06-07 NOTE — Telephone Encounter (Signed)
[facility] red team,  I referred this pt to neurosurgery a while back and she has not heard about an appointment. Can you look into this and be sure she gets an appt soon?  Thanks, Leeanne Rio, MD

## 2014-06-08 NOTE — Telephone Encounter (Signed)
Previous faxed referral was never received, referral has been refaxed.

## 2014-06-18 ENCOUNTER — Ambulatory Visit (HOSPITAL_COMMUNITY)
Admission: RE | Admit: 2014-06-18 | Discharge: 2014-06-18 | Disposition: A | Discharge status: Home / Self Care | Payer: Commercial Managed Care - HMO | Source: Ambulatory Visit | Attending: Family Medicine | Admitting: Family Medicine

## 2014-06-18 ENCOUNTER — Ambulatory Visit (HOSPITAL_COMMUNITY): Payer: Commercial Managed Care - HMO

## 2014-06-18 ENCOUNTER — Encounter (HOSPITAL_COMMUNITY): Payer: Self-pay

## 2014-06-18 ENCOUNTER — Other Ambulatory Visit: Payer: Self-pay | Admitting: Family Medicine

## 2014-06-18 DIAGNOSIS — K7689 Other specified diseases of liver: Secondary | ICD-10-CM | POA: Diagnosis not present

## 2014-06-18 DIAGNOSIS — E278 Other specified disorders of adrenal gland: Secondary | ICD-10-CM | POA: Diagnosis not present

## 2014-06-18 DIAGNOSIS — K769 Liver disease, unspecified: Secondary | ICD-10-CM

## 2014-06-18 MED ORDER — IOHEXOL 300 MG/ML  SOLN: 25.0000 mL | INTRAMUSCULAR | Status: AC

## 2014-07-02 ENCOUNTER — Ambulatory Visit (INDEPENDENT_AMBULATORY_CARE_PROVIDER_SITE_OTHER): Payer: Commercial Managed Care - HMO | Admitting: Family Medicine

## 2014-07-02 VITALS — BP 146/77 | HR 97 | Temp 98.3°F | Ht 62.0 in | Wt 160.9 lb

## 2014-07-02 DIAGNOSIS — Z7189 Other specified counseling: Secondary | ICD-10-CM

## 2014-07-02 DIAGNOSIS — E279 Disorder of adrenal gland, unspecified: Secondary | ICD-10-CM | POA: Diagnosis not present

## 2014-07-02 DIAGNOSIS — K7689 Other specified diseases of liver: Secondary | ICD-10-CM

## 2014-07-02 DIAGNOSIS — E871 Hypo-osmolality and hyponatremia: Secondary | ICD-10-CM | POA: Diagnosis not present

## 2014-07-02 DIAGNOSIS — E278 Other specified disorders of adrenal gland: Secondary | ICD-10-CM

## 2014-07-02 DIAGNOSIS — G8929 Other chronic pain: Secondary | ICD-10-CM

## 2014-07-02 MED ORDER — GABAPENTIN 800 MG PO TABS: 800.0000 mg | ORAL_TABLET | Freq: Three times a day (TID) | ORAL | Status: DC

## 2014-07-02 MED ORDER — MORPHINE SULFATE 15 MG PO TABS: 15.0000 mg | ORAL_TABLET | Freq: Four times a day (QID) | ORAL | Status: DC | PRN

## 2014-07-02 NOTE — Patient Instructions (Signed)
Great to see you again today. We discussed your CT scan results - which are stable and nothing we need to worry more about. We'll repeat your labwork in 1 month. Refilled morphine, back down to 90 pills. Sent in new prescription for 800mg  gabapentin pills three times a day.  I'll see you in a month.  Be well, Dr. [name]

## 2014-07-02 NOTE — Progress Notes (Signed)
Patient ID: [name], female   DOB: [birth year], 64 y.o.   MRN: [id number]  HPI:  Back pain/leg pain: stable, mildly worse due to weather. Has appointment on February 25 with neurosurgery to discuss possibility of neurosurgical intervention. Needs refill of pain medication today. Amenable (and eager) to decrease back down to her usual quantity of 90 pills per month. Denies fever, crotch numbness, problems with stooling or urination. She would like to increase gabapentin to 800mg  TID (was decreased to 400mg  dose at hospital discharge but used to do 1200mg  TID).  F/u CT scan: had CT abd pelvis without IV contrast (refused IV as they were not able to get an IV without multiple sticks). Showed stable liver lesions most likely representing cysts or hemangiomas. Also mild L adrenal hyperplasia.  ROS: See HPI.  [place]: hx asthma, GERD, HLD, tobacco abuse, chronic back pain, HTN  PHYSICAL EXAM: BP 146/77 mmHg  Pulse 97  Temp(Src) 98.3 F (36.8 C) (Oral)  Ht 5\' 2"  (1.575 m)  Wt 160 lb 14.4 oz (72.984 kg)  BMI 29.42 kg/m2 Gen: NAD, pleasant, cooperative HEENT: NCAT Heart: RRR Lungs: CTAB Abd: soft NTTP no masses or organomegaly Neuro: grossly nonfocal speech normal, full strength bilat lower ext. 2+ patellar reflexes bilat, sensation intact to bilat lower ext, gait normal Ext: No appreciable lower extremity edema bilaterally   ASSESSMENT/PLAN:  Encounter for chronic pain management Pain and exam stable. Has upcoming neurosurgery appointment. Will refill morphine x 1 month at lower quantity (#90) pills per month. Pt very agreeable to doing this today. Increase dose of gabapentin to 800mg  TID per pt request (previously on 1200mg  TID). F/u in 1 month.   Adrenal mass, left Although did not have study done with contrast, repeat CT characterizes this as more likely to be mild L adrenal hyperplasia. Will just plan to routinely monitor labs (which we're already doing due to her HTN, etc). No  further workup at this time. Discussed in detail with patient.   Hepatic cyst Based on CT, liver lesions are stable and likely represent cyst and hemangioma. No further workup unless new problems or symptoms arise. Discussed in detail with patient.   Chronic hyponatremia Mild chronic hyponatremia noted on labs last appt. Will plan to recheck in 1 month at next appt. Discussed with pt who is agreeable with this plan.    FOLLOW UP: F/u in 1 month for chronic pain, needs BMET at that appt.  [place]. [name], [place]

## 2014-07-06 DIAGNOSIS — E871 Hypo-osmolality and hyponatremia: Secondary | ICD-10-CM | POA: Insufficient documentation

## 2014-07-06 DIAGNOSIS — K7689 Other specified diseases of liver: Secondary | ICD-10-CM | POA: Insufficient documentation

## 2014-07-06 NOTE — Assessment & Plan Note (Signed)
Pain and exam stable. Has upcoming neurosurgery appointment. Will refill morphine x 1 month at lower quantity (#90) pills per month. Pt very agreeable to doing this today. Increase dose of gabapentin to 800mg  TID per pt request (previously on 1200mg  TID). F/u in 1 month.

## 2014-07-06 NOTE — Assessment & Plan Note (Signed)
Mild chronic hyponatremia noted on labs last appt. Will plan to recheck in 1 month at next appt. Discussed with pt who is agreeable with this plan.

## 2014-07-06 NOTE — Assessment & Plan Note (Signed)
Based on CT, liver lesions are stable and likely represent cyst and hemangioma. No further workup unless new problems or symptoms arise. Discussed in detail with patient.

## 2014-07-06 NOTE — Assessment & Plan Note (Signed)
Although did not have study done with contrast, repeat CT characterizes this as more likely to be mild L adrenal hyperplasia. Will just plan to routinely monitor labs (which we're already doing due to her HTN, etc). No further workup at this time. Discussed in detail with patient.

## 2014-07-16 DIAGNOSIS — M4317 Spondylolisthesis, lumbosacral region: Secondary | ICD-10-CM | POA: Diagnosis not present

## 2014-07-20 ENCOUNTER — Emergency Department (HOSPITAL_COMMUNITY): Payer: Commercial Managed Care - HMO

## 2014-07-20 ENCOUNTER — Encounter (HOSPITAL_COMMUNITY): Payer: Self-pay | Admitting: Emergency Medicine

## 2014-07-20 ENCOUNTER — Emergency Department (HOSPITAL_COMMUNITY)
Admission: EM | Admit: 2014-07-20 | Discharge: 2014-07-20 | Disposition: A | Discharge status: Home / Self Care | Payer: Commercial Managed Care - HMO | Attending: Emergency Medicine | Admitting: Emergency Medicine

## 2014-07-20 DIAGNOSIS — K088 Other specified disorders of teeth and supporting structures: Secondary | ICD-10-CM | POA: Diagnosis not present

## 2014-07-20 DIAGNOSIS — R05 Cough: Secondary | ICD-10-CM

## 2014-07-20 DIAGNOSIS — R059 Cough, unspecified: Secondary | ICD-10-CM

## 2014-07-20 DIAGNOSIS — K0889 Other specified disorders of teeth and supporting structures: Secondary | ICD-10-CM

## 2014-07-20 DIAGNOSIS — R079 Chest pain, unspecified: Secondary | ICD-10-CM | POA: Diagnosis not present

## 2014-07-20 DIAGNOSIS — Z720 Tobacco use: Secondary | ICD-10-CM | POA: Diagnosis not present

## 2014-07-20 DIAGNOSIS — G8929 Other chronic pain: Secondary | ICD-10-CM | POA: Insufficient documentation

## 2014-07-20 DIAGNOSIS — R51 Headache: Secondary | ICD-10-CM | POA: Diagnosis not present

## 2014-07-20 DIAGNOSIS — Z8739 Personal history of other diseases of the musculoskeletal system and connective tissue: Secondary | ICD-10-CM | POA: Insufficient documentation

## 2014-07-20 DIAGNOSIS — F17210 Nicotine dependence, cigarettes, uncomplicated: Secondary | ICD-10-CM | POA: Diagnosis not present

## 2014-07-20 DIAGNOSIS — Z79899 Other long term (current) drug therapy: Secondary | ICD-10-CM | POA: Diagnosis not present

## 2014-07-20 DIAGNOSIS — I7090 Unspecified atherosclerosis: Secondary | ICD-10-CM | POA: Diagnosis not present

## 2014-07-20 DIAGNOSIS — I10 Essential (primary) hypertension: Secondary | ICD-10-CM | POA: Insufficient documentation

## 2014-07-20 DIAGNOSIS — J45901 Unspecified asthma with (acute) exacerbation: Secondary | ICD-10-CM | POA: Insufficient documentation

## 2014-07-20 DIAGNOSIS — R0602 Shortness of breath: Secondary | ICD-10-CM

## 2014-07-20 LAB — D-DIMER, QUANTITATIVE (NOT AT ARMC): D DIMER QUANT: 0.83 ug{FEU}/mL — AB (ref 0.00–0.48)

## 2014-07-20 LAB — CBC
HCT: 38 % (ref 36.0–46.0)
HEMOGLOBIN: 12.7 g/dL (ref 12.0–15.0)
MCH: 31.1 pg (ref 26.0–34.0)
MCHC: 33.4 g/dL (ref 30.0–36.0)
MCV: 92.9 fL (ref 78.0–100.0)
PLATELETS: 237 10*3/uL (ref 150–400)
RBC: 4.09 MIL/uL (ref 3.87–5.11)
RDW: 13.4 % (ref 11.5–15.5)
WBC: 6.9 10*3/uL (ref 4.0–10.5)

## 2014-07-20 LAB — HEPATIC FUNCTION PANEL
ALK PHOS: 111 U/L (ref 39–117)
ALT: 29 U/L (ref 0–35)
AST: 39 U/L — ABNORMAL HIGH (ref 0–37)
Albumin: 3.7 g/dL (ref 3.5–5.2)
BILIRUBIN TOTAL: 0.4 mg/dL (ref 0.3–1.2)
Bilirubin, Direct: <0.1 mg/dL (ref 0.0–0.5)
Total Protein: 8.1 g/dL (ref 6.0–8.3)

## 2014-07-20 LAB — BASIC METABOLIC PANEL
Anion gap: 4 — ABNORMAL LOW (ref 5–15)
BUN: 11 mg/dL (ref 6–23)
CO2: 27 mmol/L (ref 19–32)
Calcium: 9.2 mg/dL (ref 8.4–10.5)
Chloride: 105 mmol/L (ref 96–112)
Creatinine, Ser: 0.86 mg/dL (ref 0.50–1.10)
GFR calc non Af Amer: 70 mL/min — ABNORMAL LOW (ref >90)
GFR, EST AFRICAN AMERICAN: 81 mL/min — AB (ref >90)
Glucose, Bld: 99 mg/dL (ref 70–99)
POTASSIUM: 3.9 mmol/L (ref 3.5–5.1)
SODIUM: 136 mmol/L (ref 135–145)

## 2014-07-20 LAB — I-STAT TROPONIN, ED: Troponin i, poc: 0 ng/mL (ref 0.00–0.08)

## 2014-07-20 LAB — TROPONIN I: Troponin I: <0.03 ng/mL (ref <0.031)

## 2014-07-20 MED ORDER — IOHEXOL 350 MG/ML SOLN
100.0000 mL | Freq: Once | INTRAVENOUS | Status: AC | PRN
  Administered 2014-07-20: 100 mL via INTRAVENOUS

## 2014-07-20 MED ORDER — FAMOTIDINE 20 MG PO TABS: 20.0000 mg | ORAL_TABLET | Freq: Two times a day (BID) | ORAL | Status: DC

## 2014-07-20 MED ORDER — MORPHINE SULFATE 4 MG/ML IJ SOLN
4.0000 mg | Freq: Once | INTRAMUSCULAR | Status: AC
  Administered 2014-07-20: 4 mg via INTRAVENOUS
  Filled 2014-07-20: qty 1

## 2014-07-20 MED ORDER — HYDROCODONE-ACETAMINOPHEN 5-325 MG PO TABS: 1.0000 | ORAL_TABLET | ORAL | Status: DC | PRN

## 2014-07-20 MED ORDER — GI COCKTAIL ~~LOC~~
30.0000 mL | Freq: Once | ORAL | Status: AC
  Administered 2014-07-20: 30 mL via ORAL
  Filled 2014-07-20: qty 30

## 2014-07-20 MED ORDER — PANTOPRAZOLE SODIUM 40 MG IV SOLR
40.0000 mg | Freq: Once | INTRAVENOUS | Status: AC
  Administered 2014-07-20: 40 mg via INTRAVENOUS
  Filled 2014-07-20: qty 40

## 2014-07-20 MED ORDER — SODIUM CHLORIDE 0.9 % IV BOLUS (SEPSIS)
500.0000 mL | Freq: Once | INTRAVENOUS | Status: AC
  Administered 2014-07-20: 500 mL via INTRAVENOUS

## 2014-07-20 MED ORDER — IPRATROPIUM-ALBUTEROL 0.5-2.5 (3) MG/3ML IN SOLN
3.0000 mL | Freq: Once | RESPIRATORY_TRACT | Status: AC
  Administered 2014-07-20: 3 mL via RESPIRATORY_TRACT
  Filled 2014-07-20: qty 3

## 2014-07-20 MED ORDER — HYDROMORPHONE HCL 1 MG/ML IJ SOLN
1.0000 mg | Freq: Once | INTRAMUSCULAR | Status: AC
  Administered 2014-07-20: 1 mg via INTRAVENOUS
  Filled 2014-07-20: qty 1

## 2014-07-20 NOTE — ED Notes (Signed)
Emily West, PA at bedside.  

## 2014-07-20 NOTE — ED Notes (Signed)
Pt sts mid sternal CP worse with cough x 2 days

## 2014-07-20 NOTE — ED Notes (Signed)
Patient transported to X-ray 

## 2014-07-20 NOTE — ED Notes (Signed)
Pt undressed, in gown, on monitor, continuous pulse oximetry and blood pressure cuff; sheet given

## 2014-07-20 NOTE — Discharge Instructions (Signed)
Read the information below.  Use the prescribed medication as directed.  Please discuss all new medications with your pharmacist.  Do not take additional tylenol while taking the prescribed pain medication to avoid overdose.  You may return to the Emergency Department at any time for worsening condition or any new symptoms that concern you.  If you develop worsening chest pain, shortness of breath, fever, you pass out, or become weak or dizzy, return to the ER for a recheck.   Please call a dentist to schedule a close follow up appointment.  If you develop fevers, swelling in your face, difficulty swallowing or breathing, return to the ER immediately for a recheck.     Chest Pain (Nonspecific) It is often hard to give a diagnosis for the cause of chest pain. There is always a chance that your pain could be related to something serious, such as a heart attack or a blood clot in the lungs. You need to follow up with your doctor. HOME CARE  If antibiotic medicine was given, take it as directed by your doctor. Finish the medicine even if you start to feel better.  For the next few days, avoid activities that bring on chest pain. Continue physical activities as told by your doctor.  Do not use any tobacco products. This includes cigarettes, chewing tobacco, and e-cigarettes.  Avoid drinking alcohol.  Only take medicine as told by your doctor.  Follow your doctor's suggestions for more testing if your chest pain does not go away.  Keep all doctor visits you made. GET HELP IF:  Your chest pain does not go away, even after treatment.  You have a rash with blisters on your chest.  You have a fever. GET HELP RIGHT AWAY IF:   You have more pain or pain that spreads to your arm, neck, jaw, back, or belly (abdomen).  You have shortness of breath.  You cough more than usual or cough up blood.  You have very bad back or belly pain.  You feel sick to your stomach (nauseous) or throw up  (vomit).  You have very bad weakness.  You pass out (faint).  You have chills. This is an emergency. Do not wait to see if the problems will go away. Call your local emergency services (911 in U.S[place] Do not drive yourself to the hospital. MAKE SURE YOU:   Understand these instructions.  Will watch your condition.  Will get help right away if you are not doing well or get worse. Document Released: 10/25/2007 Document Revised: 05/13/2013 Document Reviewed: 10/25/2007 [facility] Patient Information 2015 [place], [place]. This information is not intended to replace advice given to you by your health care provider. Make sure you discuss any questions you have with your health care provider.  Dental Pain Toothache is pain in or around a tooth. It may get worse with chewing or with cold or heat.  HOME CARE  Your dentist may use a numbing medicine during treatment. If so, you may need to avoid eating until the medicine wears off. Ask your dentist about this.  Only take medicine as told by your dentist or doctor.  Avoid chewing food near the painful tooth until after all treatment is done. Ask your dentist about this. GET HELP RIGHT AWAY IF:   The problem gets worse or new problems appear.  You have a fever.  There is redness and puffiness (swelling) of the face, jaw, or neck.  You cannot open your mouth.  There is  pain in the jaw.  There is very bad pain that is not helped by medicine. MAKE SURE YOU:   Understand these instructions.  Will watch your condition.  Will get help right away if you are not doing well or get worse. Document Released: 10/25/2007 Document Revised: 07/31/2011 Document Reviewed: 10/25/2007 [facility] Patient Information 2015 [place], [place]. This information is not intended to replace advice given to you by your health care provider. Make sure you discuss any questions you have with your health care provider.    Emergency Department Resource Guide 1)  Find a Doctor and Pay Out of Pocket Although you won't have to find out who is covered by your insurance plan, it is a good idea to ask around and get recommendations. You will then need to call the office and see if the doctor you have chosen will accept you as a new patient and what types of options they offer for patients who are self-pay. Some doctors offer discounts or will set up payment plans for their patients who do not have insurance, but you will need to ask so you aren't surprised when you get to your appointment.  2) Contact Your Local Health Department Not all health departments have doctors that can see patients for sick visits, but many do, so it is worth a call to see if yours does. If you don't know where your local health department is, you can check in your phone book. The CDC also has a tool to help you locate your state's health department, and many state websites also have listings of all of their local health departments.  3) Find a [facility] If your illness is not likely to be very severe or complicated, you may want to try a walk in clinic. These are popping up all over the country in pharmacies, drugstores, and shopping centers. They're usually staffed by nurse practitioners or physician assistants that have been trained to treat common illnesses and complaints. They're usually fairly quick and inexpensive. However, if you have serious medical issues or chronic medical problems, these are probably not your best option.  No Primary Care Doctor: - Call Health Connect at  [phone] - they can help you locate a primary care doctor that  accepts your insurance, provides certain services, etc. - Physician Referral Service- [phone]  Chronic Pain Problems: Organization         Address  Phone   Notes  [facility]  [phone] Patients need to be referred by their primary care doctor.   Medication Assistance: Organization         Address  Phone    Notes  [facility] Medication [facility] [address] [place], [place] [address] [phone] --Must be a resident of [facility] -- Must have NO insurance coverage whatsoever (no Medicaid/ Medicare, etc.) -- The pt. MUST have a primary care doctor that directs their care regularly and follows them in the community   MedAssist  [phone]   [employer]  [phone]    Agencies that provide inexpensive medical care: Organization         Address  Phone   Notes  [place]  [phone]   [facility]    [phone]   [facility] [place], [place] [address] [phone]   [place] [address] [place] [phone]   Planned Parenthood    (  [phone]   [facility]    [phone]   [facility]  [address], [place] Phone:  [phone], Fax:  [phone] Hours of Operation:  9 am - 6 pm, M-F.  Also accepts Medicaid/Medicare and self-pay.  [facility] for Lameeka Schleifer [address], [address], [place] Phone: [phone], Fax: [phone]. Hours of Operation:  8:30 am - 5:30 pm, M-F.  Also accepts Medicaid and self-pay.  [facility] [place] [address], [place] Phone: [phone]   [place], [place], [place] [phone], Ext. [phone] Mondays & Thursdays: 7-9 AM.  First 15 patients are seen on a first come, first serve basis.    [place] Providers:  Organization         Address  Phone   Notes  [facility] [address], [place] [phone] Also accepts self-pay patients.  [facility] [address], [place]  [phone]   [place], [address], [place] [phone]   [facility] Family  Medicine [address], [place] [phone]   Lucianne Lei [address], [place]   [phone] Only accepts [place] Access [place] patients after they have their name applied to their card.   Self-Pay (no insurance) in [facility] Acute Care:  Organization         Address  Phone   Notes  Sickle Cell Patients, [facility] [place] [phone]   [facility] [place] [phone]   [facility] [place]  [place], [place], [place] [phone]   [facility]/Dr. Osei-Bonsu  [address], [place] or [address], [place] [phone] Phone number for both [place] and [place] locations is the same.  Urgent Medical and [facility] [address], [place] [phone]   [facility] [address] [place] or [address] [phone]   [facility] [address] [place] [phone], phone; [phone], fax Sees patients 1st and 3rd Saturday of every month.  Must not qualify for public or private insurance (i.e. Medicaid, Medicare, [place] Health Choice, Veterans' Benefits)  Household income should be no more than 200% of the poverty level The clinic cannot treat you if you are pregnant or think you are pregnant  Sexually transmitted diseases are not treated at the clinic.    Dental Care: Organization         Address  Phone  Notes  [facility] Department of [facility] [place] [phone] Accepts children up to age 54 who are enrolled in [place] or [place]; pregnant women with a Medicaid card; and children who have applied for Medicaid or [place] Health Choice, but were declined, whose parents can pay a reduced fee at time of service.  [facility] Department of [facility]  [address], [place] [phone] Accepts children up to age 59 who are enrolled in [place] or [place]; pregnant women with a Medicaid card; and children who have applied for Medicaid or [place], but were declined, whose parents can pay a  reduced fee at time of service.  [place] Adult Dental Access PROGRAM  [place] [phone] Patients are seen by appointment only. Walk-ins are not accepted. [place] will see patients 60 years of age and older. Monday - Tuesday (8am-5pm) Most Wednesdays (8:30-5pm) $30 per visit, cash only  [facility] Adult Dental Access PROGRAM  [address], [facility] [phone] Patients are seen by appointment only. Walk-ins are not accepted. [place] will see patients 64 years of age and older. One Wednesday Evening (Monthly: Volunteer Based).  $30 per visit, cash only  [place]  [phone] for adults; Children under age 17, call Graduate Pediatric Dentistry at [phone]. Children aged 38-14, please call [phone] to request a pediatric application.  Dental services are provided in all areas of dental care including fillings, crowns and bridges, complete and partial dentures, implants, gum treatment, root canals, and extractions. Preventive care is also provided. Treatment is provided to both adults and children. Patients are selected via a lottery and there is often a waiting list.   [facility] [address], [place]  [phone] [email or web address]   [facility] [address] [place], [place] [phone], Ext. [phone] Second and Fourth Thursday of each month, opens at 6:30 AM; Clinic ends at 9 AM.  Patients are seen on a first-come first-served basis, and a limited number are seen during each clinic.   [facility]  [address] Hillard Danker [place], [place] [phone]   Eligibility Requirements You must have lived in  [place], [place], or [place] counties for at least the last three months.   You cannot be eligible for state or federal sponsored [employer], including [employer], [place], or [employer].   You generally cannot be eligible for healthcare insurance through your employer.    How to apply: Eligibility screenings are held every Tuesday and Wednesday afternoon from 1:00 pm until 4:00 pm. You do not need an appointment for the interview!  [facility] [address], [place], [place]   [place]  [place] Department  [place]  [phone]    Behavioral Health Resources in the Community: Intensive Outpatient Programs Organization         Address  Phone  Notes  [place]. [address] [place], [place] [phone]   [facility] Outpatient [address] [place], [place]   ADS: Alcohol & Drug Svcs [address], [place], [place]   [place] [address],  [place], [place] or [phone]   Substance Abuse Resources Organization         Address  Phone  Notes  Alcohol and Drug Services  [phone]   [place]  [phone]   [facility] [place]   Chinita Pester  [phone]   Residential & Outpatient Substance Abuse Program  [phone]   Psychological Services Organization         Address  Phone  Notes  [facility] [place]  [place]  [phone]   [place] [address] [place] or [phone]    Mobile Crisis Teams Organization         Address  Phone  Notes  [facility]rapeutic Alternatives, Mobile Crisis Care Unit  [phone]  Assertive Psychotherapeutic Services  [address] [place], [place]   [facility] [address] [place] [phone]    Self-Help/Support Groups Organization         Address  Phone             Notes  Mental Health Assoc. of [place] - variety of support groups  [place] Call for more information  Narcotics Anonymous (NA), Caring [facility] [address], [employer] [place]  2 meetings at this location   Special educational needs teacher         Address  Phone  Notes  ASAP Residential Treatment [place],    [place]  [phone]   [facility]  [address] [place] [address], [place], [place]   [place], [place] [phone] Admissions: 8am-3pm M-F  Incentives Substance [place] [address],    [place], [place] [phone]   [facility] [address] [place], [place], [place]   [facility] [address]  [place], [place]   [facility] Programs - Intensive Outpatient [address], [place], [place]   [facility] ([place].) [place].,  [place], [place] [phone] or [phone]   [facility] Services (RTS) [address] [place], [place] Accepts Medicaid  [facility] [place] [address]  [place]hira [place] [phone] Substance Abuse/Addiction Treatment   [facility] Organization         Address  Phone  Notes  [facility]  [phone]   Domenic Schwab, PhD [address] Arlis Port[place], [place]   [phone] or [phone]   Reserve [place], [place] [phone]   [facility] [address], [place], [place] [phone] Insurance/Medicaid/sponsorship through [facility] and Families [address] [place]                                    [place], [place] [phone] [place] [address], [place] [phone]    Dr. Adele Schilder  [phone]   [facility] Dept. 1) [address], [place] 2) [place] 3)  [place] [address], [place] [phone]  [phone]   [place] [phone] or [phone] (After Hours)

## 2014-07-20 NOTE — ED Notes (Signed)
Patient transported to CT 

## 2014-07-20 NOTE — ED Provider Notes (Signed)
CSN: [id number]     Arrival date & time 07/20/14  0818 History   First MD Initiated Contact with Patient 07/20/14 0827     Chief Complaint  Patient presents with  . Chest Pain     (Consider location/radiation/quality/duration/timing/severity/associated sxs/prior Treatment) The history is provided by the patient.     Pt with hx HTN, asthma p/w central chest pain and SOB that began yesterday, dry cough that began overnight.  Pain is described as pressure or "lump" in her chest, is constant, gradually worsening, worse with deep inspiration and with walking around.  Also has headache.  Has tried tums without relief.  Pain is 10/10 intensity.  Denies fevers/chills, lightheadedness/dizziness, nausea, abdominal pain, leg swelling.  No personal or family hx blood clots or CAD.  No exogenous estrogen.  Only recent immobilization was 2 day hospitalization Dec 26-28 (2 months ago).   No heavy lifting or chest trauma.   Past Medical History  Diagnosis Date  . Hypertension   . Spinal stenosis of lumbar region   . Asthma   . Tobacco abuse   . GERD (gastroesophageal reflux disease)   . Generalized headaches     ocasional, she uses naproxen.  . Chronic back pain   . Sciatica of left side   . Left lumbar radiculopathy   . Chronic leg pain     left   Past Surgical History  Procedure Laterality Date  . Cesarean section      x2  . Tracheostomy      when she was 20   History reviewed. No pertinent family history. History  Substance Use Topics  . Smoking status: Current Every Day Smoker -- 0.30 packs/day    Types: Cigarettes  . Smokeless tobacco: Never Used     Comment: will quitt this year  . Alcohol Use: No   OB History    No data available     Review of Systems  All other systems reviewed and are negative.     Allergies  Procaine hcl  Home Medications   Prior to Admission medications   Medication Sig Start Date End Date Taking? Authorizing Provider  albuterol (PROVENTIL  HFA;VENTOLIN HFA) 108 (90 BASE) MCG/ACT inhaler Inhale into the lungs every 6 (six) hours as needed for wheezing or shortness of breath.    Historical Provider, MD  atorvastatin (LIPITOR) 20 MG tablet Take 1 tablet (20 mg total) by mouth daily. 04/23/14   Leeanne Rio, MD  Calcium-Magnesium-Vitamin D (CITRACAL CALCIUM+D) 600-40-500 MG-MG-UNIT TB24 Take 1 tablet by mouth 2 (two) times daily.     Historical Provider, MD  cyclobenzaprine (FLEXERIL) 10 MG tablet Take 1 tablet (10 mg total) by mouth 3 (three) times daily as needed for muscle spasms. 03/02/14   Leeanne Rio, MD  gabapentin (NEURONTIN) 800 MG tablet Take 1 tablet (800 mg total) by mouth 3 (three) times daily. 07/02/14   Leeanne Rio, MD  ibuprofen (ADVIL,MOTRIN) 800 MG tablet Take 800 mg by mouth every 8 (eight) hours as needed for mild pain.    Historical Provider, MD  lisinopril-hydrochlorothiazide (PRINZIDE,ZESTORETIC) 20-25 MG per tablet Take 0.5 tablets by mouth daily.    Historical Provider, MD  morphine (MSIR) 15 MG tablet Take 1 tablet (15 mg total) by mouth 4 (four) times daily as needed for severe pain. 07/02/14   Leeanne Rio, MD   BP 113/59 mmHg  Pulse 92  Temp(Src) 98.7 F (37.1 C) (Oral)  Resp 18  SpO2 95% Physical Exam  Constitutional: She appears well-developed and well-nourished. No distress.  HENT:  Head: Normocephalic and atraumatic.  Neck: Neck supple.  Cardiovascular: Normal rate and regular rhythm.   Pulmonary/Chest: Effort normal and breath sounds normal. No respiratory distress. She has no wheezes. She has no rales. She exhibits tenderness.  Abdominal: Soft. She exhibits no distension. There is no tenderness. There is no rebound and no guarding.  Musculoskeletal: She exhibits no edema.  Neurological: She is alert.  Skin: She is not diaphoretic.  Nursing note and vitals reviewed.   ED Course  Procedures (including critical care time) Labs Review Labs Reviewed  BASIC METABOLIC  PANEL - Abnormal; Notable for the following:    GFR calc non Af Amer 70 (*)    GFR calc Af Amer 81 (*)    Anion gap 4 (*)    All other components within normal limits  HEPATIC FUNCTION PANEL - Abnormal; Notable for the following:    AST 39 (*)    All other components within normal limits  CBC  D-DIMER, QUANTITATIVE  I-STAT TROPOININ, ED    Imaging Review Dg Chest 2 View  07/20/2014   CLINICAL DATA:  Acute mid sternal chest pain with cough for 2 days  EXAM: CHEST  2 VIEW  COMPARISON:  06/18/2014, 05/16/2014  FINDINGS: The heart size and mediastinal contours are within normal limits. Both lungs are clear. The visualized skeletal structures are unremarkable. Minor diffuse lumbar degenerative changes spondylosis. External artifacts over the upper lobes.  IMPRESSION: No active cardiopulmonary disease.   Electronically Signed   By: Jerilynn Mages.  Shick M.D.   On: 07/20/2014 09:17     EKG Interpretation   Date/Time:  Monday July 20 2014 08:21:09 EST Ventricular Rate:  92 PR Interval:  152 QRS Duration: 78 QT Interval:  352 QTC Calculation: 435 R Axis:   51 Text Interpretation:  Normal sinus rhythm Normal ECG similar to previous,  poor baseline on inferior leads Confirmed by ZAVITZ  MD, JOSHUA ([id number]) on  07/20/2014 8:31:27 AM       10:39 AM Discussed pt with Dr Reather Converse.   1:31 PM Patient now reporting her chest pain hurts more when she swallows and she is also complaining of new right lower dental pain that began while she was in the ED.  Right lower first molar with filling in place, tender to percussion, no edema, erythema, no facial swelling.  No obvious abscess. Will give GI cocktail and protonix.    MDM   Final diagnoses:  Cough  SOB (shortness of breath)  Chest pain  Pain, dental    Afebrile, nontoxic patient with chest pain, SOB, cough.  Workup including nonischemic EKG, negative troponin x 2,  Positive d-dimer with negative CT angio chest.  After workup pt notes it hurts more  with swallowing - GI cocktail and protonix given.  She also then complained of dental pain. No obvious abscess.  Doubt ACS, pericarditis, pneumonia, PE.  Pt also seen and examined by Dr Reather Converse who agrees with workup and plan.  D/C home with pain medication, famotidine, close PCP follow up.   Discussed result, findings, treatment, and follow up  with patient.  Pt given return precautions.  Pt verbalizes understanding and agrees with plan.         Clayton Bibles, PA-C 07/20/14 1524  Mariea Clonts, MD 07/20/14 [phone]

## 2014-07-20 NOTE — ED Notes (Signed)
D-Dimer in process now.

## 2014-07-29 DIAGNOSIS — M4806 Spinal stenosis, lumbar region: Secondary | ICD-10-CM | POA: Diagnosis not present

## 2014-07-29 DIAGNOSIS — M5416 Radiculopathy, lumbar region: Secondary | ICD-10-CM | POA: Diagnosis not present

## 2014-08-05 ENCOUNTER — Ambulatory Visit (INDEPENDENT_AMBULATORY_CARE_PROVIDER_SITE_OTHER): Payer: Commercial Managed Care - HMO | Admitting: Family Medicine

## 2014-08-05 ENCOUNTER — Encounter: Payer: Self-pay | Admitting: Family Medicine

## 2014-08-05 VITALS — BP 154/52 | HR 91 | Temp 98.5°F | Ht 62.0 in | Wt 165.0 lb

## 2014-08-05 DIAGNOSIS — G8929 Other chronic pain: Secondary | ICD-10-CM

## 2014-08-05 DIAGNOSIS — K219 Gastro-esophageal reflux disease without esophagitis: Secondary | ICD-10-CM | POA: Diagnosis not present

## 2014-08-05 DIAGNOSIS — E871 Hypo-osmolality and hyponatremia: Secondary | ICD-10-CM

## 2014-08-05 DIAGNOSIS — Z7189 Other specified counseling: Secondary | ICD-10-CM | POA: Diagnosis not present

## 2014-08-05 DIAGNOSIS — I10 Essential (primary) hypertension: Secondary | ICD-10-CM

## 2014-08-05 MED ORDER — FAMOTIDINE 20 MG PO TABS
20.0000 mg | ORAL_TABLET | Freq: Two times a day (BID) | ORAL | Status: DC
Start: 2014-08-05 — End: 2015-07-09

## 2014-08-05 MED ORDER — MORPHINE SULFATE 15 MG PO TABS: 15.0000 mg | ORAL_TABLET | Freq: Three times a day (TID) | ORAL | Status: DC | PRN

## 2014-08-05 NOTE — Progress Notes (Signed)
Patient ID: Raven [name], female   DOB: 08/22/1950, 64 y.o.   MRN: [id number]  HPI:  Chronic back pain: had epidural injection done by Dr. Brien Few of [facility]) one week ago. Having a little bit of increased pain due to the injection but anticipates that it will get better with time. Takes morphine 3 times per day, and this helps her be functional. Denies having fever, saddle anesthesia, lower extremity weakness, or problems with stooling or urination.   GERD: Recently went to the ER with chest pain. Had CTA chest which did not show PE. Has not had recurrent chest pain. Started famotidine which helped a lot. Would like a new rx for this from me.  HTN - did not take BP meds this morning. No SOB. No swelling.  ROS: See HPI.  [place]: Hyperlipidemia, asthma, hypertension, GERD, chronic opioid dependence due to chronic pain  PHYSICAL EXAM: BP 154/52 mmHg  Pulse 91  Temp(Src) 98.5 F (36.9 C) (Oral)  Ht 5\' 2"  (1.575 m)  Wt 165 lb (74.844 kg)  BMI 30.17 kg/m2 Gen: NAD, pleasant, cooperative Heart: RRR Lungs: CTAB, NWOB Abd: soft NTTP Neuro: grossly nonfocal, speech intact Ext: No appreciable lower extremity edema bilaterally, full strength bilat lower ext, and back mildly tender to palpation  ASSESSMENT/PLAN:  Encounter for chronic pain management Mildly increased pain due to recent injection. Hopefully she will get net benefit from this. Refill morphine for one month. Follow-up in one month.   Chronic hyponatremia BMET checked during ER visit showed normal sodium. We will continue to monitor this from time to time.   HYPERTENSION, BENIGN SYSTEMIC BP mildly elevated, but patient did not take her blood pressure medicine today. I will recheck this when she returns in one month.   GERD (gastroesophageal reflux disease) No further chest pain since starting famotidine. I will refill this for her today.    FOLLOW UP: F/u in 1 month for chronic  pain  [place] J. [name], [place]

## 2014-08-05 NOTE — Patient Instructions (Signed)
It was great to see you again today!  We refilled your pain medicine for 1 month I sent in a prescription for famotidine I will see you back in one month  Be well, Dr. [name]

## 2014-08-07 NOTE — Assessment & Plan Note (Signed)
BMET checked during ER visit showed normal sodium. We will continue to monitor this from time to time.

## 2014-08-07 NOTE — Assessment & Plan Note (Signed)
BP mildly elevated, but patient did not take her blood pressure medicine today. I will recheck this when she returns in one month.

## 2014-08-07 NOTE — Assessment & Plan Note (Signed)
No further chest pain since starting famotidine. I will refill this for her today.

## 2014-08-07 NOTE — Assessment & Plan Note (Signed)
Mildly increased pain due to recent injection. Hopefully she will get net benefit from this. Refill morphine for one month. Follow-up in one month.

## 2014-08-24 ENCOUNTER — Ambulatory Visit (INDEPENDENT_AMBULATORY_CARE_PROVIDER_SITE_OTHER): Payer: Commercial Managed Care - HMO | Admitting: Family Medicine

## 2014-08-24 ENCOUNTER — Encounter: Payer: Self-pay | Admitting: Family Medicine

## 2014-08-24 VITALS — BP 128/78 | HR 93 | Temp 98.1°F | Ht 62.0 in | Wt 161.6 lb

## 2014-08-24 DIAGNOSIS — G8929 Other chronic pain: Secondary | ICD-10-CM

## 2014-08-24 DIAGNOSIS — K088 Other specified disorders of teeth and supporting structures: Secondary | ICD-10-CM | POA: Diagnosis not present

## 2014-08-24 DIAGNOSIS — I10 Essential (primary) hypertension: Secondary | ICD-10-CM | POA: Diagnosis not present

## 2014-08-24 DIAGNOSIS — E785 Hyperlipidemia, unspecified: Secondary | ICD-10-CM | POA: Diagnosis not present

## 2014-08-24 DIAGNOSIS — K0889 Other specified disorders of teeth and supporting structures: Secondary | ICD-10-CM

## 2014-08-24 DIAGNOSIS — Z7189 Other specified counseling: Secondary | ICD-10-CM

## 2014-08-24 MED ORDER — AMOXICILLIN-POT CLAVULANATE 875-125 MG PO TABS: 1.0000 | ORAL_TABLET | Freq: Two times a day (BID) | ORAL | Status: DC

## 2014-08-24 MED ORDER — LISINOPRIL-HYDROCHLOROTHIAZIDE 20-25 MG PO TABS: 0.5000 | ORAL_TABLET | Freq: Every day | ORAL | Status: DC

## 2014-08-24 MED ORDER — HYDROCODONE-ACETAMINOPHEN 10-325 MG PO TABS: 1.0000 | ORAL_TABLET | Freq: Three times a day (TID) | ORAL | Status: DC | PRN

## 2014-08-24 MED ORDER — GABAPENTIN 800 MG PO TABS: 800.0000 mg | ORAL_TABLET | Freq: Three times a day (TID) | ORAL | Status: DC

## 2014-08-24 MED ORDER — ATORVASTATIN CALCIUM 20 MG PO TABS: 20.0000 mg | ORAL_TABLET | Freq: Every day | ORAL | Status: DC

## 2014-08-24 NOTE — Patient Instructions (Signed)
Take Augmentin for dental pain Switching from morphine and hydrocodone for pain Please try to get in with a dentist. This is ultimately the only thing that will help these repeated dental infections. Follow up with me in one month or sooner if you're not improving. I sent in refills of your medicines to [facility].  Leeanne Rio, MD

## 2014-08-24 NOTE — Progress Notes (Signed)
Patient ID: Raven [name], female   DOB: September 09, 1950, 64 y.o.   MRN: [id number]  HPI:  Dental pain: Has been having pain in her right upper and lower teeth. She's tried Anbesol, Orajel, warm salt water, as well as peroxide and water gargles. Has not had any improvement. She's not had any fevers. She's drinking well but is having a lot of trouble eating. Has not been able to sleep due to the pain. She does not have a dentist that she can go to she does not have any dental insurance. Also has pain in her right ear.  Chronic back pain: States she is doing well, steroid injections are helping her a lot. She had one injection prior to her last visit. She has been taking her morphine 3 times a day. States this does not help her at all. She would like to switch from morphine to something else. Willing to try hydrocodone. She denies any problems with stooling or urination, also denies any weakness in her legs.  Hypertension: Needs refill of her lisinopril-HCTZ. Denies chest pain or shortness of breath. Tolerating this medicine well.  ROS: See HPI.  [place]: Hypertension, asthma, GERD, chronic low back pain, chronic opioid use, hyperlipidemia  PHYSICAL EXAM: BP 128/78 mmHg  Pulse 93  Temp(Src) 98.1 F (36.7 C) (Oral)  Ht 5\' 2"  (1.575 m)  Wt 161 lb 9.6 oz (73.301 kg)  BMI 29.55 kg/m2 Gen: No acute distress, pleasant, cooperative HEENT: Normocephalic, atraumatic, no facial swelling. Full range of motion of neck. +Anterior cervical lymphadenopathy. No supraclavicular lymphadenopathy. TMs clear bilaterally. No trismus. Able to open mouth. Tenderness to gums without any discrete abscess appreciable on dental exam. Poor dentition in general. Mouth is moist without ulcers or exudate.  Heart: Regular rate and rhythm, no murmur  Lungs: Clear to auscultation bilaterally, normal respiratory effort  Neuro: Grossly nonfocal, speech normal, gait normal  Ext: No edema   ASSESSMENT/PLAN:  Encounter for chronic  pain management Up-to-date on UDS and pain contract. I reviewed the [place] controlled substance database today, it has appropriate findings. Patient prefers to switch from morphine to another opioid. I will switch her to hydrocodone 10-3 25 mg 1 pill 3 times a day as needed. Dispensed 90 per month. She will follow-up in one month to evaluate for improvement in her pain. She has fortunately had improvement in her back pain after getting injections. Ideally we should be able to wean off of narcotics in the future, but we are unable to do this now given her severe dental pain.   Tooth pain No obvious abscess on exam. I will treat her with Augmentin for antibiotic coverage. Pain control as per chronic opioids. Discussed that foundation of treatment is going to be getting her to a dentist who can work with her on removing these teeth. She will follow-up with me if she is not getting better.   HYPERTENSION, BENIGN SYSTEMIC Well-controlled, continue current regimen.    FOLLOW UP: F/u in 1 month for chronic pain  [place] J. [name], [place]

## 2014-08-25 NOTE — Assessment & Plan Note (Signed)
No obvious abscess on exam. I will treat her with Augmentin for antibiotic coverage. Pain control as per chronic opioids. Discussed that foundation of treatment is going to be getting her to a dentist who can work with her on removing these teeth. She will follow-up with me if she is not getting better.

## 2014-08-25 NOTE — Assessment & Plan Note (Signed)
Well-controlled, continue current regimen 

## 2014-08-25 NOTE — Assessment & Plan Note (Signed)
Up-to-date on UDS and pain contract. I reviewed the [place] controlled substance database today, it has appropriate findings. Patient prefers to switch from morphine to another opioid. I will switch her to hydrocodone 10-3 25 mg 1 pill 3 times a day as needed. Dispensed 90 per month. She will follow-up in one month to evaluate for improvement in her pain. She has fortunately had improvement in her back pain after getting injections. Ideally we should be able to wean off of narcotics in the future, but we are unable to do this now given her severe dental pain.

## 2014-09-17 ENCOUNTER — Telehealth: Payer: Self-pay | Admitting: Family Medicine

## 2014-09-17 NOTE — Telephone Encounter (Signed)
Will likely need appt, but will forward to MD as she saw her recently for the same issue. [place], Salome Spotted

## 2014-09-17 NOTE — Telephone Encounter (Signed)
Pt called and would like some pain medicine and antibiotics called in for her bad tooth. jw

## 2014-09-18 NOTE — Telephone Encounter (Signed)
Spoke with patient and she made an appt for Monday morning at 930am. Caroleen Stoermer,CMA

## 2014-09-18 NOTE — Telephone Encounter (Signed)
Needs appointment. Cannot rx these things over the phone without evaluation. Please inform patient.  Leeanne Rio, MD

## 2014-09-21 ENCOUNTER — Ambulatory Visit (INDEPENDENT_AMBULATORY_CARE_PROVIDER_SITE_OTHER): Payer: Commercial Managed Care - HMO | Admitting: Family Medicine

## 2014-09-21 ENCOUNTER — Encounter: Payer: Self-pay | Admitting: Family Medicine

## 2014-09-21 ENCOUNTER — Telehealth: Payer: Self-pay | Admitting: Family Medicine

## 2014-09-21 VITALS — BP 133/63 | HR 79 | Temp 98.0°F | Ht 62.0 in | Wt 157.6 lb

## 2014-09-21 DIAGNOSIS — K088 Other specified disorders of teeth and supporting structures: Secondary | ICD-10-CM | POA: Diagnosis not present

## 2014-09-21 DIAGNOSIS — K0889 Other specified disorders of teeth and supporting structures: Secondary | ICD-10-CM

## 2014-09-21 MED ORDER — PENICILLIN V POTASSIUM 500 MG PO TABS: 500.0000 mg | ORAL_TABLET | Freq: Four times a day (QID) | ORAL | Status: DC

## 2014-09-21 MED ORDER — IBUPROFEN 800 MG PO TABS: 800.0000 mg | ORAL_TABLET | Freq: Three times a day (TID) | ORAL | Status: DC | PRN

## 2014-09-21 MED ORDER — IBUPROFEN 800 MG PO TABS
800.0000 mg | ORAL_TABLET | Freq: Three times a day (TID) | ORAL | Status: DC | PRN
Start: 2014-09-21 — End: 2014-09-21

## 2014-09-21 NOTE — Progress Notes (Signed)
   Subjective:    Patient ID: Lin Givens, female    DOB: 1950/12/09, 64 y.o.   MRN: [id number]  HPI 64 year old female with a history of chronic pain presents for same day appointment with complaints of dental pain.  1) Dental pain  Patient has been experiencing dental pain since the beginning of April.  At that time she was seen by her primary and started on Augmentin.  Additionally, her pain medication was switched from morphine to hydrocodone at that time.  She was given a 30 day supply.  She presents today with continued complaints of dental pain. She states that it is now on both sides of her mouth.  Is also causing jaw pain.  No recent fevers or chills.  She's continued to use over-the-counter remedies with little relief. She does express relief in her pain medication.  She does not seen a dentist due to out-of-pocket cost.  Review of Systems  Constitutional: Negative for fever and chills.  HENT: Positive for dental problem.       Objective:   Physical Exam Filed Vitals:   09/21/14 0924  BP: 133/63  Pulse: 79  Temp: 98 F (36.7 C)   Vital signs reviewed.  Exam: General: well appearing, NAD. HEENT: NCAT. Normal TMs bilaterally.  Oropharynx clear.  Gums/dentition - poor dentition throughout with carious teeth.  No focal area of abscess.    Assessment & Plan:  See problem list

## 2014-09-21 NOTE — Telephone Encounter (Signed)
New Rxs sent.

## 2014-09-21 NOTE — Patient Instructions (Signed)
Please continue your pain medication as prescribed.  You can also use tylenol.  I also sent in a prescription for Ibuprofen and an antibiotic.  This is not going to fix your issue.  You really need to see a dentist.   Follow up with your primary as indicated.   Take care  Dr. Lacinda Axon

## 2014-09-21 NOTE — Telephone Encounter (Signed)
Pt was seen today in [facility] and we called in her prescriptions to [facility] mail order. We need to call these into [facility] on [address] . jw

## 2014-09-21 NOTE — Assessment & Plan Note (Signed)
No focal abscess. Advised continued use of prescribed pain medication. Also advised use of Tylenol and/or Motrin if needed. After discussion with attending Dr. McDiarmid, I started the patient on penicillin to hopefully dampen pain. Gave patient handout on [employer].  I discussed with patient that our treatments are not going to resolve her issue. She really needs to see a dentist. Patient understands this.

## 2014-09-25 ENCOUNTER — Encounter: Payer: Self-pay | Admitting: Family Medicine

## 2014-09-25 ENCOUNTER — Ambulatory Visit (INDEPENDENT_AMBULATORY_CARE_PROVIDER_SITE_OTHER): Payer: Commercial Managed Care - HMO | Admitting: Family Medicine

## 2014-09-25 VITALS — BP 121/62 | HR 82 | Temp 98.4°F | Ht 62.0 in | Wt 163.9 lb

## 2014-09-25 DIAGNOSIS — Z7189 Other specified counseling: Secondary | ICD-10-CM | POA: Diagnosis not present

## 2014-09-25 DIAGNOSIS — G8929 Other chronic pain: Secondary | ICD-10-CM

## 2014-09-25 MED ORDER — HYDROCODONE-ACETAMINOPHEN 10-325 MG PO TABS: 1.0000 | ORAL_TABLET | Freq: Three times a day (TID) | ORAL | Status: DC | PRN

## 2014-09-25 MED ORDER — CYCLOBENZAPRINE HCL 10 MG PO TABS: 10.0000 mg | ORAL_TABLET | Freq: Three times a day (TID) | ORAL | Status: DC | PRN

## 2014-09-25 NOTE — Patient Instructions (Signed)
Great to see you again today. Decreased # of pills to 75/month Follow up with me in 1 month.  Be well, Dr. [name]

## 2014-09-25 NOTE — Progress Notes (Signed)
Patient ID: Raven [name], female   DOB: 1951-04-27, 64 y.o.   MRN: [id number]  HPI:  Chronic pain: doing well with pain. Taking hydrocodone 10-325mg , two pills per day. She has worked on being able to decrease this and is agreeable to a smaller amount this month (#75). Has had just one epidural injection and is interested in getting more. Denies having fever, saddle anesthesia, lower extremity weakness, or problems with stooling or urination.   Came 4 days ago and saw Dr. Lacinda Axon for dental pain. Given rx for penicillin. Teeth are now feeling a lot better.  ROS: See HPI.  [place]: hx HTN, asthma, GERD, HLD, chronic pain  PHYSICAL EXAM: BP 121/62 mmHg  Pulse 82  Temp(Src) 98.4 F (36.9 C) (Oral)  Ht 5\' 2"  (1.575 m)  Wt 163 lb 14.4 oz (74.345 kg)  BMI 29.97 kg/m2 Gen: NAD HEENT: NCAT Ext: full strength bilat lower ext, 2+ patellar reflexes bilat, gait normal Neuro: grossly nonfocal speech normal  ASSESSMENT/PLAN:  Encounter for chronic pain management Doing well. Amenable to decreasing quantity of narcotic. Epidural injections have helped. rx hydrocodone 10-325mg  #75. F/u in 1 month.    FOLLOW UP: F/u in 1 month for chronic pain  [place] J. [name], [place]

## 2014-09-29 NOTE — Assessment & Plan Note (Signed)
Doing well. Amenable to decreasing quantity of narcotic. Epidural injections have helped. rx hydrocodone 10-325mg  #75. F/u in 1 month.

## 2014-11-03 ENCOUNTER — Encounter: Payer: Self-pay | Admitting: Family Medicine

## 2014-11-03 ENCOUNTER — Ambulatory Visit (INDEPENDENT_AMBULATORY_CARE_PROVIDER_SITE_OTHER): Payer: Commercial Managed Care - HMO | Admitting: Family Medicine

## 2014-11-03 VITALS — BP 126/85 | HR 104 | Temp 98.0°F | Ht 62.0 in | Wt 161.5 lb

## 2014-11-03 DIAGNOSIS — Z7189 Other specified counseling: Secondary | ICD-10-CM | POA: Diagnosis not present

## 2014-11-03 DIAGNOSIS — M549 Dorsalgia, unspecified: Secondary | ICD-10-CM

## 2014-11-03 DIAGNOSIS — T50905A Adverse effect of unspecified drugs, medicaments and biological substances, initial encounter: Secondary | ICD-10-CM

## 2014-11-03 DIAGNOSIS — K5903 Drug induced constipation: Secondary | ICD-10-CM

## 2014-11-03 DIAGNOSIS — G8929 Other chronic pain: Secondary | ICD-10-CM

## 2014-11-03 DIAGNOSIS — K5909 Other constipation: Secondary | ICD-10-CM

## 2014-11-03 MED ORDER — KETOROLAC TROMETHAMINE 30 MG/ML IJ SOLN
30.0000 mg | Freq: Once | INTRAMUSCULAR | Status: AC
  Administered 2014-11-03: 30 mg via INTRAMUSCULAR

## 2014-11-03 MED ORDER — HYDROCODONE-ACETAMINOPHEN 10-325 MG PO TABS: 1.0000 | ORAL_TABLET | Freq: Two times a day (BID) | ORAL | Status: DC | PRN

## 2014-11-03 MED ORDER — POLYETHYLENE GLYCOL 3350 17 GM/SCOOP PO POWD: 17.0000 g | Freq: Two times a day (BID) | ORAL | Status: AC | PRN

## 2014-11-03 MED ORDER — GABAPENTIN 800 MG PO TABS: 800.0000 mg | ORAL_TABLET | Freq: Three times a day (TID) | ORAL | Status: DC

## 2014-11-03 MED ORDER — LISINOPRIL-HYDROCHLOROTHIAZIDE 20-25 MG PO TABS: 0.5000 | ORAL_TABLET | Freq: Every day | ORAL | Status: DC

## 2014-11-03 MED ORDER — IBUPROFEN 800 MG PO TABS: 800.0000 mg | ORAL_TABLET | Freq: Three times a day (TID) | ORAL | Status: DC | PRN

## 2014-11-03 NOTE — Patient Instructions (Signed)
Decreasing # of pills to 60 per month. Will gradually work on tapering these off. Gave toradol shot today Refilled medications Use miralax 1-2 times daily as needed to have regular bowel movements. This should help the cramping.  Follow up in 1 month.  Be well, Dr. [name]

## 2014-11-03 NOTE — Progress Notes (Signed)
Patient ID: Raven [name], female   DOB: March 29, 1951, 64 y.o.   MRN: [id number]  HPI:  Chronic pain: does not think the opioids she's on are helping her pain. Pain is primarily in lower back and left leg. Throbbing pain. Amenable to decreasing quantity again. Would like toradol shot today.  Stomach cramps: Having cramping in stomach. Has taken ibuprofen in the past for this, takes only twice a month. Has bowel movement about twice per week.   ROS: See HPI.  [place]: hx hLD, HTN, chronic pain, GERD, asthma  PHYSICAL EXAM: BP 126/85 mmHg  Pulse 104  Temp(Src) 98 F (36.7 C) (Oral)  Ht 5\' 2"  (1.575 m)  Wt 161 lb 8 oz (73.256 kg)  BMI 29.53 kg/m2 Gen: NAD, pleasant, cooperative HEENT: NCAT Heart: RRR no murmur Lungs: CTAB NWOB Abd: soft NTTP, NABS, no masses or organomegaly, no peritoneal signs Neuro: grossly nonfocal speech normal Ext: full strength bilat lower ext  ASSESSMENT/PLAN:  Health maintenance:  -needs separate health maintenance physical appt  Encounter for chronic pain management Pt agreeable to decreasing # of pills to 60 per month. Wants to gradually come off this medication. toradol shot today. F/u in 1 mo.  Constipation due to pain medication Abdomen exam benign. Start miralax daily for help with bowel movements. Refilled ibuprofen per patient request. States she takes this just 1-2 times a month. Needs 90 day supply for insurance purposes. Discussed possible side effects of regular nsaid use and encouraged just using sparingly 1-2 times per month.   FOLLOW UP: F/u in 1 mo for chronic pain, physical, constipation  [place] J. [name], [place]

## 2014-11-08 NOTE — Assessment & Plan Note (Addendum)
Pt agreeable to decreasing # of pills to 60 per month. Wants to gradually come off this medication. toradol shot today. F/u in 1 mo.

## 2014-11-08 NOTE — Assessment & Plan Note (Addendum)
Abdomen exam benign. Start miralax daily for help with bowel movements. Refilled ibuprofen per patient request. States she takes this just 1-2 times a month. Needs 90 day supply for insurance purposes. Discussed possible side effects of regular nsaid use and encouraged just using sparingly 1-2 times per month.

## 2014-11-27 ENCOUNTER — Other Ambulatory Visit: Payer: Self-pay

## 2014-11-27 DIAGNOSIS — Z1231 Encounter for screening mammogram for malignant neoplasm of breast: Secondary | ICD-10-CM

## 2014-12-01 ENCOUNTER — Ambulatory Visit (INDEPENDENT_AMBULATORY_CARE_PROVIDER_SITE_OTHER): Payer: Commercial Managed Care - HMO | Admitting: Family Medicine

## 2014-12-01 ENCOUNTER — Encounter: Payer: Self-pay | Admitting: Family Medicine

## 2014-12-01 ENCOUNTER — Other Ambulatory Visit (HOSPITAL_COMMUNITY)
Admission: RE | Admit: 2014-12-01 | Discharge: 2014-12-01 | Disposition: A | Discharge status: Home / Self Care | Payer: Commercial Managed Care - HMO | Source: Ambulatory Visit | Attending: Family Medicine | Admitting: Family Medicine

## 2014-12-01 VITALS — BP 145/70 | HR 87 | Temp 98.2°F | Ht 62.0 in | Wt 164.0 lb

## 2014-12-01 DIAGNOSIS — R87810 Cervical high risk human papillomavirus (HPV) DNA test positive: Secondary | ICD-10-CM | POA: Insufficient documentation

## 2014-12-01 DIAGNOSIS — Z124 Encounter for screening for malignant neoplasm of cervix: Secondary | ICD-10-CM | POA: Diagnosis not present

## 2014-12-01 DIAGNOSIS — N898 Other specified noninflammatory disorders of vagina: Secondary | ICD-10-CM | POA: Insufficient documentation

## 2014-12-01 DIAGNOSIS — G8929 Other chronic pain: Secondary | ICD-10-CM

## 2014-12-01 DIAGNOSIS — Z1151 Encounter for screening for human papillomavirus (HPV): Secondary | ICD-10-CM | POA: Diagnosis not present

## 2014-12-01 DIAGNOSIS — Z7189 Other specified counseling: Secondary | ICD-10-CM | POA: Diagnosis not present

## 2014-12-01 MED ORDER — HYDROCODONE-ACETAMINOPHEN 10-325 MG PO TABS: 1.0000 | ORAL_TABLET | Freq: Two times a day (BID) | ORAL | Status: DC | PRN

## 2014-12-01 MED ORDER — HYDROCORTISONE 0.5 % EX CREA: 1.0000 "application " | TOPICAL_CREAM | Freq: Two times a day (BID) | CUTANEOUS | Status: DC

## 2014-12-01 NOTE — Progress Notes (Signed)
Patient ID: Raven [name], female   DOB: 1951-05-06, 64 y.o.   MRN: [id number]   HPI:  Patient presents today for a well woman exam.   Concerns today: pain med refill, rash Periods: postmenopausal Contraception: postmenopausal Pelvic symptoms: no pain, bleeding, discharge Sexual activity: one partner in last year STD Screening: declines today Pap smear status: due for last pap today Exercise: walks alot Diet: fried foods only twice per month Smoking: 1/4 pack per day, not interested in quitting Alcohol: rarely Drugs: no drugs Advance directives: full code Mammogram: scheduled for 22 of this month Colonoscopy: has never had a colonoscopy  Pain med - taking norco twice a day, once in the morning and once at night. Wants to continue this dose. Pain med helps her be functional.   Rash on arms, thigh, stomach - has had for about 2 weeks. Itchy. Started after she went to an outdoor event. No known tick bites. No fevers, sores in mouth/genitals, new medicines or foods. Feels well in general, just itching from the rash.  Pt also states she has "skin tags" in her groin area that she wants removed.  ROS: See HPI  [place]:  Cancers in family: dad's side of family with prostate cancer, two aunts with breast cancer  PHYSICAL EXAM: BP 145/70 mmHg  Pulse 87  Temp(Src) 98.2 F (36.8 C)  Ht 5\' 2"  (1.575 m)  Wt 164 lb (74.39 kg)  BMI 29.99 kg/m2 Gen: NAD, pleasant, cooperative HEENT: NCAT, PERRL, no palpable thyromegaly or anterior cervical lymphadenopathy Heart: RRR, no murmurs Lungs: CTAB, NWOB Abdomen: soft, nontender to palpation Neuro: grossly nonfocal, speech normal GU: normal appearing external genitalia without lesions. Moderate redundant tissue of left labia minora which pt identifies as the "skin tag". Vagina is moist with white discharge. There is a visible nodule on anterior vaginal wall just proximal to the introitus. Palpable with digital exam, and is both fluctuant/fluid  filled and nontender. Cervix normal in appearance. No cervical motion tenderness or tenderness on bimanual exam. No adnexal masses.  Ext: No appreciable lower extremity edema bilaterally  Skin: scattered occasional pruritic papules, some of which are erythematous and some of which are more pigmented/healing. No skin breakdown.  ASSESSMENT/PLAN:  # Health maintenance:  -STD screening: pt declined today -pap smear: pap collected today, if normal will no longer need paps in future -mammogram: already scheduled for this month -advance directives: full code per our discussion today -immunizations: gave handouts on Tdap and zostavax -handout given on health maintenance topics  Vaginal cyst Refer to OB/GYN for further evaluation of vaginal wall cyst identified on exam today.  Encounter for chronic pain management Pain stable. UTD on UDS, contract, controlled substance database review. Norco refilled #60. F/u in 1 month.    Redundant labial tissue Likely just pt's physiologic unique anatomy. Advised that this is not something we would remove here at [facility], would require OB/GYN referral. Since we are referring her for the likely vaginal wall cyst, I recommended she ask them about the extra tissue at that appointment as well.  Rash Appears to be bug bites. No red flags. Rx hydrocortisone cream.  FOLLOW UP: F/u in 1 month for chronic pain  [place] J. [name], [place]

## 2014-12-01 NOTE — Assessment & Plan Note (Signed)
Pain stable. UTD on UDS, contract, controlled substance database review. Norco refilled #60. F/u in 1 month.

## 2014-12-01 NOTE — Assessment & Plan Note (Signed)
Refer to OB/GYN for further evaluation of vaginal wall cyst identified on exam today.

## 2014-12-01 NOTE — Patient Instructions (Signed)
I am referring you to a gynecologist for the spot on your vagina. You will get a phone call to schedule this appointment.  See handouts on colonoscopy, shingles shot, and tetanus shot. Follow up in 1 month.  Be well, Dr. [name]   Health Maintenance Adopting a healthy lifestyle and getting preventive care can go a long way to promote health and wellness. Talk with your health care provider about what schedule of regular examinations is right for you. This is a good chance for you to check in with your provider about disease prevention and staying healthy. In between checkups, there are plenty of things you can do on your own. Experts have done a lot of research about which lifestyle changes and preventive measures are most likely to keep you healthy. Ask your health care provider for more information. WEIGHT AND DIET  Eat a healthy diet  Be sure to include plenty of vegetables, fruits, low-fat dairy products, and lean protein.  Do not eat a lot of foods high in solid fats, added sugars, or salt.  Get regular exercise. This is one of the most important things you can do for your health.  Most adults should exercise for at least 150 minutes each week. The exercise should increase your heart rate and make you sweat (moderate-intensity exercise).  Most adults should also do strengthening exercises at least twice a week. This is in addition to the moderate-intensity exercise.  Maintain a healthy weight  Body mass index (BMI) is a measurement that can be used to identify possible weight problems. It estimates body fat based on height and weight. Your health care provider can help determine your BMI and help you achieve or maintain a healthy weight.  For females 47 years of age and older:   A BMI below 18.5 is considered underweight.  A BMI of 18.5 to 24.9 is normal.  A BMI of 25 to 29.9 is considered overweight.  A BMI of 30 and above is considered obese.  Watch levels of cholesterol  and blood lipids  You should start having your blood tested for lipids and cholesterol at 65 years of age, then have this test every 5 years.  You may need to have your cholesterol levels checked more often if:  Your lipid or cholesterol levels are high.  You are older than 65 years of age.  You are at high risk for heart disease.  CANCER SCREENING   Lung Cancer  Lung cancer screening is recommended for adults 94-5 years old who are at high risk for lung cancer because of a history of smoking.  A yearly low-dose CT scan of the lungs is recommended for people who:  Currently smoke.  Have quit within the past 15 years.  Have at least a 30-pack-year history of smoking. A pack year is smoking an average of one pack of cigarettes a day for 1 year.  Yearly screening should continue until it has been 15 years since you quit.  Yearly screening should stop if you develop a health problem that would prevent you from having lung cancer treatment.  Breast Cancer  Practice breast self-awareness. This means understanding how your breasts normally appear and feel.  It also means doing regular breast self-exams. Let your health care provider know about any changes, no matter how small.  If you are in your 20s or 30s, you should have a clinical breast exam (CBE) by a health care provider every 1-3 years as part of a regular health  exam.  If you are 40 or older, have a CBE every year. Also consider having a breast X-ray (mammogram) every year.  If you have a family history of breast cancer, talk to your health care provider about genetic screening.  If you are at high risk for breast cancer, talk to your health care provider about having an MRI and a mammogram every year.  Breast cancer gene (BRCA) assessment is recommended for women who have family members with BRCA-related cancers. BRCA-related cancers include:  Breast.  Ovarian.  Tubal.  Peritoneal cancers.  Results of the  assessment will determine the need for genetic counseling and BRCA1 and BRCA2 testing. Cervical Cancer Routine pelvic examinations to screen for cervical cancer are no longer recommended for nonpregnant women who are considered low risk for cancer of the pelvic organs (ovaries, uterus, and vagina) and who do not have symptoms. A pelvic examination may be necessary if you have symptoms including those associated with pelvic infections. Ask your health care provider if a screening pelvic exam is right for you.   The Pap test is the screening test for cervical cancer for women who are considered at risk.  If you had a hysterectomy for a problem that was not cancer or a condition that could lead to cancer, then you no longer need Pap tests.  If you are older than 65 years, and you have had normal Pap tests for the past 10 years, you no longer need to have Pap tests.  If you have had past treatment for cervical cancer or a condition that could lead to cancer, you need Pap tests and screening for cancer for at least 20 years after your treatment.  If you no longer get a Pap test, assess your risk factors if they change (such as having a new sexual partner). This can affect whether you should start being screened again.  Some women have medical problems that increase their chance of getting cervical cancer. If this is the case for you, your health care provider may recommend more frequent screening and Pap tests.  The human papillomavirus (HPV) test is another test that may be used for cervical cancer screening. The HPV test looks for the virus that can cause cell changes in the cervix. The cells collected during the Pap test can be tested for HPV.  The HPV test can be used to screen women 72 years of age and older. Getting tested for HPV can extend the interval between normal Pap tests from three to five years.  An HPV test also should be used to screen women of any age who have unclear Pap test  results.  After 65 years of age, women should have HPV testing as often as Pap tests.  Colorectal Cancer  This type of cancer can be detected and often prevented.  Routine colorectal cancer screening usually begins at 65 years of age and continues through 65 years of age.  Your health care provider may recommend screening at an earlier age if you have risk factors for colon cancer.  Your health care provider may also recommend using home test kits to check for hidden blood in the stool.  A small camera at the end of a tube can be used to examine your colon directly (sigmoidoscopy or colonoscopy). This is done to check for the earliest forms of colorectal cancer.  Routine screening usually begins at age 9.  Direct examination of the colon should be repeated every 5-10 years through 65 years of  age. However, you may need to be screened more often if early forms of precancerous polyps or small growths are found. Skin Cancer  Check your skin from head to toe regularly.  Tell your health care provider about any new moles or changes in moles, especially if there is a change in a mole's shape or color.  Also tell your health care provider if you have a mole that is larger than the size of a pencil eraser.  Always use sunscreen. Apply sunscreen liberally and repeatedly throughout the day.  Protect yourself by wearing long sleeves, pants, a wide-brimmed hat, and sunglasses whenever you are outside. HEART DISEASE, DIABETES, AND HIGH BLOOD PRESSURE   Have your blood pressure checked at least every 1-2 years. High blood pressure causes heart disease and increases the risk of stroke.  If you are between 98 years and 20 years old, ask your health care provider if you should take aspirin to prevent strokes.  Have regular diabetes screenings. This involves taking a blood sample to check your fasting blood sugar level.  If you are at a normal weight and have a low risk for diabetes, have this  test once every three years after 65 years of age.  If you are overweight and have a high risk for diabetes, consider being tested at a younger age or more often. PREVENTING INFECTION  Hepatitis B  If you have a higher risk for hepatitis B, you should be screened for this virus. You are considered at high risk for hepatitis B if:  You were born in a country where hepatitis B is common. Ask your health care provider which countries are considered high risk.  Your parents were born in a high-risk country, and you have not been immunized against hepatitis B (hepatitis B vaccine).  You have HIV or AIDS.  You use needles to inject street drugs.  You live with someone who has hepatitis B.  You have had sex with someone who has hepatitis B.  You get hemodialysis treatment.  You take certain medicines for conditions, including cancer, organ transplantation, and autoimmune conditions. Hepatitis C  Blood testing is recommended for:  Everyone born from [birth year] through 1966.  Anyone with known risk factors for hepatitis C. Sexually transmitted infections (STIs)  You should be screened for sexually transmitted infections (STIs) including gonorrhea and chlamydia if:  You are sexually active and are younger than 65 years of age.  You are older than 65 years of age and your health care provider tells you that you are at risk for this type of infection.  Your sexual activity has changed since you were last screened and you are at an increased risk for chlamydia or gonorrhea. Ask your health care provider if you are at risk.  If you do not have HIV, but are at risk, it may be recommended that you take a prescription medicine daily to prevent HIV infection. This is called pre-exposure prophylaxis (PrEP). You are considered at risk if:  You are sexually active and do not regularly use condoms or know the HIV status of your partner(s).  You take drugs by injection.  You are sexually active with  a partner who has HIV. Talk with your health care provider about whether you are at high risk of being infected with HIV. If you choose to begin PrEP, you should first be tested for HIV. You should then be tested every 3 months for as long as you are taking PrEP.  PREGNANCY  If you are premenopausal and you may become pregnant, ask your health care provider about preconception counseling.  If you may become pregnant, take 400 to 800 micrograms (mcg) of folic acid every day.  If you want to prevent pregnancy, talk to your health care provider about birth control (contraception). OSTEOPOROSIS AND MENOPAUSE   Osteoporosis is a disease in which the bones lose minerals and strength with aging. This can result in serious bone fractures. Your risk for osteoporosis can be identified using a bone density scan.  If you are 65 years of age or older, or if you are at risk for osteoporosis and fractures, ask your health care provider if you should be screened.  Ask your health care provider whether you should take a calcium or vitamin D supplement to lower your risk for osteoporosis.  Menopause may have certain physical symptoms and risks.  Hormone replacement therapy may reduce some of these symptoms and risks. Talk to your health care provider about whether hormone replacement therapy is right for you.  HOME CARE INSTRUCTIONS   Schedule regular health, dental, and eye exams.  Stay current with your immunizations.   Do not use any tobacco products including cigarettes, chewing tobacco, or electronic cigarettes.  If you are pregnant, do not drink alcohol.  If you are breastfeeding, limit how much and how often you drink alcohol.  Limit alcohol intake to no more than 1 drink per day for nonpregnant women. One drink equals 12 ounces of beer, 5 ounces of wine, or 1 ounces of hard liquor.  Do not use street drugs.  Do not share needles.  Ask your health care provider for help if you need  support or information about quitting drugs.  Tell your health care provider if you often feel depressed.  Tell your health care provider if you have ever been abused or do not feel safe at home. Document Released: 11/21/2010 Document Revised: 09/22/2013 Document Reviewed: 04/09/2013 ExitCare Patient Information 2015 [employer]. This information is not intended to replace advice given to you by your health care provider. Make sure you discuss any questions you have with your health care provider.  

## 2014-12-02 LAB — CYTOLOGY - PAP

## 2014-12-09 ENCOUNTER — Telehealth: Payer: Self-pay | Admitting: Family Medicine

## 2014-12-09 DIAGNOSIS — B977 Papillomavirus as the cause of diseases classified elsewhere: Secondary | ICD-10-CM | POA: Insufficient documentation

## 2014-12-09 NOTE — Telephone Encounter (Signed)
Called pt to discuss pap smear results, which showed negative cytology but + high risk HPV. Had hoped pap would be entirely normal and that we could stop screening for cervical cancer, but as HPV positive, will recommend repeat cotest (pap and HPV) in one year. Pt informed and appreciative of the phone call.  Leeanne Rio, MD

## 2014-12-11 ENCOUNTER — Ambulatory Visit
Admission: RE | Admit: 2014-12-11 | Discharge: 2014-12-11 | Disposition: A | Discharge status: Home / Self Care | Payer: Commercial Managed Care - HMO | Source: Ambulatory Visit

## 2014-12-11 DIAGNOSIS — Z1231 Encounter for screening mammogram for malignant neoplasm of breast: Secondary | ICD-10-CM

## 2014-12-22 ENCOUNTER — Encounter: Payer: Self-pay | Admitting: Obstetrics & Gynecology

## 2014-12-28 ENCOUNTER — Encounter: Payer: Self-pay | Admitting: Family Medicine

## 2014-12-28 ENCOUNTER — Ambulatory Visit (INDEPENDENT_AMBULATORY_CARE_PROVIDER_SITE_OTHER): Payer: Commercial Managed Care - HMO | Admitting: Family Medicine

## 2014-12-28 VITALS — BP 115/67 | HR 81 | Temp 98.3°F | Ht 62.0 in | Wt 165.2 lb

## 2014-12-28 DIAGNOSIS — Z7189 Other specified counseling: Secondary | ICD-10-CM

## 2014-12-28 DIAGNOSIS — M549 Dorsalgia, unspecified: Secondary | ICD-10-CM

## 2014-12-28 DIAGNOSIS — B977 Papillomavirus as the cause of diseases classified elsewhere: Secondary | ICD-10-CM | POA: Diagnosis not present

## 2014-12-28 DIAGNOSIS — G8929 Other chronic pain: Secondary | ICD-10-CM

## 2014-12-28 MED ORDER — KETOROLAC TROMETHAMINE 30 MG/ML IJ SOLN
30.0000 mg | Freq: Once | INTRAMUSCULAR | Status: AC
  Administered 2014-12-28: 30 mg via INTRAMUSCULAR

## 2014-12-28 MED ORDER — HYDROCODONE-ACETAMINOPHEN 10-325 MG PO TABS: 1.0000 | ORAL_TABLET | Freq: Two times a day (BID) | ORAL | Status: DC | PRN

## 2014-12-28 MED ORDER — CETIRIZINE HCL 10 MG PO TABS: 10.0000 mg | ORAL_TABLET | Freq: Every day | ORAL | Status: DC

## 2014-12-28 NOTE — Progress Notes (Signed)
Patient ID: Raven [name], female   DOB: [birth year], 64 y.o.   MRN: [id number]  HPI:  Chronic pain: wants to go back to work. It is her goal to eventually come off these medicines. Currently taking norco 10-325mg  BID. Pain is still present, especially as weather is bad today. Would like toradol shot today for pain. Does not want to decrease dose of norco today.  Rash: has itchy bumps on her arms and legs.  No one else has these. No fevers or sores in mouth or genitals. No new medicines or foods. Wants rx for benadryl liquid.   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   ROS: See HPI.  [place]: hx HLD, tobacco abuse, HTN, asthma, GERD, chronic pain  PHYSICAL EXAM: BP 115/67 mmHg  Pulse 81  Temp(Src) 98.3 F (36.8 C) (Oral)  Ht 5\' 2"  (1.575 m)  Wt 165 lb 3.2 oz (74.934 kg)  BMI 30.21 kg/m2 Gen: NAD HEENT: NCAT Ext: full strength bilat lower extremities Back: mildly TTP low back Skin: erythematous papules scattered on arms/legs. No skin breakdown or signs of infection.  ASSESSMENT/PLAN:  Encounter for chronic pain management Pain stable, mildly worsened today due to weather. Will give toradol shot as requested. Refill norco. F/u in 1 mo. Still have goal of continuing to decrease. She's done nicely with decreasing thus far.  High risk HPV infection Explained in detail about pap result, and the role of HPV in developing cervical cancer. Handout provided. Needs repeat pap in 1 year.  Rash - appears bug bites. Needs itch relief. Does not think she is obtaining new bites anymore, just dealing with pruritis from previously obtained bites. Will do trial of zyrtec 10mg  daily, want to avoid benadryl if possible due to sedating effects as pt also on chronic opioids.  FOLLOW UP: F/u in 1 month for chronic pain  [place] J. [name], [place]

## 2014-12-28 NOTE — Patient Instructions (Signed)
Try zyrtec for itching. Let me know if this doesn't help Refilled medicine for 1 month Follow up in 1 mo  See handout on HPV  Be well, Dr. [name]

## 2014-12-30 NOTE — Assessment & Plan Note (Signed)
Explained in detail about pap result, and the role of HPV in developing cervical cancer. Handout provided. Needs repeat pap in 1 year.

## 2014-12-30 NOTE — Assessment & Plan Note (Signed)
Pain stable, mildly worsened today due to weather. Will give toradol shot as requested. Refill norco. F/u in 1 mo. Still have goal of continuing to decrease. She's done nicely with decreasing thus far.

## 2015-01-04 ENCOUNTER — Telehealth: Payer: Self-pay | Admitting: Family Medicine

## 2015-01-04 MED ORDER — DIPHENHYDRAMINE HCL 25 MG PO TABS
12.5000 mg | ORAL_TABLET | Freq: Two times a day (BID) | ORAL | Status: DC | PRN
Start: 2015-01-04 — End: 2015-07-09

## 2015-01-04 NOTE — Telephone Encounter (Signed)
Pt was given medication for itching one week ago, pt says it is not doing anything for her and would like something else called in, pt goes to [facility]/[place].

## 2015-01-04 NOTE — Telephone Encounter (Signed)
Small quantity of benadryl sent for patient to [place]. Please inform patient, and also ask her to use caution with this medication as it may make her sleepy.  Leeanne Rio, MD

## 2015-01-04 NOTE — Telephone Encounter (Signed)
Patient informed, expressed understanding. 

## 2015-01-13 ENCOUNTER — Encounter: Payer: Commercial Managed Care - HMO | Admitting: Obstetrics & Gynecology

## 2015-01-22 ENCOUNTER — Ambulatory Visit (INDEPENDENT_AMBULATORY_CARE_PROVIDER_SITE_OTHER): Payer: Commercial Managed Care - HMO | Admitting: Obstetrics and Gynecology

## 2015-01-22 ENCOUNTER — Encounter: Payer: Self-pay | Admitting: Obstetrics and Gynecology

## 2015-01-22 VITALS — BP 126/74 | HR 90 | Temp 98.8°F | Ht 62.0 in | Wt 167.4 lb

## 2015-01-22 DIAGNOSIS — N368 Other specified disorders of urethra: Secondary | ICD-10-CM

## 2015-01-22 NOTE — Progress Notes (Signed)
   Subjective:    Patient ID: Raven [name], female    DOB: 01/16/1951, 64 y.o.   MRN: [id number]  HPI  64 yo referred [facility] for the evaluation of a vulva skin tag and a vaginal wall nodule. Patient denies any pain to the vulva. She denies any vaginal bleeding. She was not aware of the vaginal wall cyst until mentioned by her doctor. She denies any difficulty with urination  Past Medical History  Diagnosis Date  . Hypertension   . Spinal stenosis of lumbar region   . Asthma   . Tobacco abuse   . GERD (gastroesophageal reflux disease)   . Generalized headaches     ocasional, she uses naproxen.  . Chronic back pain   . Sciatica of left side   . Left lumbar radiculopathy   . Chronic leg pain     left   Past Surgical History  Procedure Laterality Date  . Cesarean section      x2  . Tracheostomy      when she was 20   No family history on file. Social History  Substance Use Topics  . Smoking status: Current Every Day Smoker -- 0.30 packs/day    Types: Cigarettes  . Smokeless tobacco: Never Used     Comment: will quitt this year  . Alcohol Use: No     Review of Systems See pertinent in HPI    Objective:   Physical Exam  GENERAL: Well-developed, well-nourished female in no acute distress.  PELVIC: Normal external female genitalia. No evidence of skin tag. Patient pointed to the area of the skin tag which actually corresponds to the labia minora. Vagina is atrophic and rugated.  Normal discharge. Normal appearing cervix. Uterus is normal in size.  No adnexal mass or tenderness. No anterior vaginal wall nodule palpated. A small 0.5 mm urethral prolapse noted. EXTREMITIES: No cyanosis, clubbing, or edema, 2+ distal pulses.       Assessment & Plan:  64 yo with normal vulva anatomy - Reassurance provided - RTC prn

## 2015-02-02 ENCOUNTER — Encounter: Payer: Self-pay | Admitting: Family Medicine

## 2015-02-02 ENCOUNTER — Ambulatory Visit (INDEPENDENT_AMBULATORY_CARE_PROVIDER_SITE_OTHER): Payer: Commercial Managed Care - HMO | Admitting: Family Medicine

## 2015-02-02 VITALS — BP 140/62 | HR 83 | Temp 98.3°F | Ht 62.0 in | Wt 165.0 lb

## 2015-02-02 DIAGNOSIS — R21 Rash and other nonspecific skin eruption: Secondary | ICD-10-CM | POA: Diagnosis not present

## 2015-02-02 DIAGNOSIS — Z7189 Other specified counseling: Secondary | ICD-10-CM

## 2015-02-02 DIAGNOSIS — E785 Hyperlipidemia, unspecified: Secondary | ICD-10-CM

## 2015-02-02 DIAGNOSIS — I10 Essential (primary) hypertension: Secondary | ICD-10-CM | POA: Diagnosis not present

## 2015-02-02 DIAGNOSIS — G8929 Other chronic pain: Secondary | ICD-10-CM

## 2015-02-02 MED ORDER — ATORVASTATIN CALCIUM 20 MG PO TABS: 20.0000 mg | ORAL_TABLET | Freq: Every day | ORAL | Status: DC

## 2015-02-02 MED ORDER — HYDROCODONE-ACETAMINOPHEN 7.5-325 MG PO TABS: 1.0000 | ORAL_TABLET | Freq: Two times a day (BID) | ORAL | Status: DC | PRN

## 2015-02-02 MED ORDER — IBUPROFEN 800 MG PO TABS: 800.0000 mg | ORAL_TABLET | Freq: Every day | ORAL | Status: DC | PRN

## 2015-02-02 MED ORDER — LISINOPRIL-HYDROCHLOROTHIAZIDE 20-25 MG PO TABS: 0.5000 | ORAL_TABLET | Freq: Every day | ORAL | Status: DC

## 2015-02-02 MED ORDER — PREDNISONE 20 MG PO TABS: 20.0000 mg | ORAL_TABLET | Freq: Every day | ORAL | Status: DC

## 2015-02-02 NOTE — Patient Instructions (Signed)
Refilled medicine (lipitor, BP medicine, ibuprofen) Use ibuprofen only as needed no more than a couple times a month Take prednisone 20mg  daily for 7 days to help with itching Trying lower dose of pain medicine (7.5mg  twice a day instead of 10mg  twice a day) Follow up with me in 66month, sooner if needed  Be well, Dr. [name]

## 2015-02-02 NOTE — Progress Notes (Signed)
  HPI:  Rash: still itching all over. Got benadryl but it didn't help much. Found out dog she was watching had fleas. No new bites recently but the ones she had before are very itchy. Daughter had these as well.  Back pain - flaring today due to weather. Taking norco 10-325mg  twice a day (one pill in AM, one at night). Also requests rx for ibuprofen 800mg  pills. Takes these twice a month at most.   HTN - needs refill of BP med. Taking lisinopril-HCTZ 10-12.5mg  daily (half of a 20-25 pill). Tolerating this med without difficulty  ROS: See HPI.  [place]: hx asthma, chronic pain on opioids, GERD, HPV, HLD, HTN  PHYSICAL EXAM: BP 140/62 mmHg  Pulse 83  Temp(Src) 98.3 F (36.8 C) (Oral)  Ht 5\' 2"  (1.575 m)  Wt 165 lb (74.844 kg)  BMI 30.17 kg/m2 Gen: NAD, pleasant, cooperative HEENT: NCAT Neuro: grossly nonfocal, speech normal Ext: full strength bilat lower ext Skin: papular erythematous scattered rash on arms, no skin breakdown or signs of infection  ASSESSMENT/PLAN:  Encounter for chronic pain management Agreeable to trial of decrease in pain meds. Will decrease norco to 7.5-325mg  twice a day. F/u in 1 mo.  HYPERTENSION, BENIGN SYSTEMIC Well controlled. Continue current regimen.   Rash and nonspecific skin eruption Consistent with flea bites. No red flags. Lots of itching, unresponsive to both zyrtec and benadryl. Too widespread to cover with topical steroid. Trial of low dose prednisone 20mg  daily x 7 days. F/u if not improving.   FOLLOW UP: F/u in 1 month for chronic pain  [place] J. [name], [place]

## 2015-02-04 DIAGNOSIS — R21 Rash and other nonspecific skin eruption: Secondary | ICD-10-CM | POA: Insufficient documentation

## 2015-02-04 NOTE — Assessment & Plan Note (Signed)
Agreeable to trial of decrease in pain meds. Will decrease norco to 7.5-325mg  twice a day. F/u in 1 mo.

## 2015-02-04 NOTE — Assessment & Plan Note (Signed)
Consistent with flea bites. No red flags. Lots of itching, unresponsive to both zyrtec and benadryl. Too widespread to cover with topical steroid. Trial of low dose prednisone 20mg  daily x 7 days. F/u if not improving.

## 2015-02-04 NOTE — Assessment & Plan Note (Signed)
Well-controlled.  Continue current regimen. 

## 2015-03-04 ENCOUNTER — Ambulatory Visit (INDEPENDENT_AMBULATORY_CARE_PROVIDER_SITE_OTHER): Payer: Commercial Managed Care - HMO | Admitting: Family Medicine

## 2015-03-04 ENCOUNTER — Encounter: Payer: Self-pay | Admitting: Family Medicine

## 2015-03-04 VITALS — BP 125/60 | HR 96 | Temp 98.6°F | Ht 62.0 in | Wt 170.0 lb

## 2015-03-04 DIAGNOSIS — G8929 Other chronic pain: Secondary | ICD-10-CM

## 2015-03-04 DIAGNOSIS — Z23 Encounter for immunization: Secondary | ICD-10-CM

## 2015-03-04 DIAGNOSIS — M545 Low back pain: Secondary | ICD-10-CM

## 2015-03-04 DIAGNOSIS — Z7189 Other specified counseling: Secondary | ICD-10-CM | POA: Diagnosis not present

## 2015-03-04 DIAGNOSIS — E785 Hyperlipidemia, unspecified: Secondary | ICD-10-CM | POA: Diagnosis not present

## 2015-03-04 DIAGNOSIS — R21 Rash and other nonspecific skin eruption: Secondary | ICD-10-CM | POA: Diagnosis not present

## 2015-03-04 MED ORDER — HYDROCODONE-ACETAMINOPHEN 10-325 MG PO TABS: 1.0000 | ORAL_TABLET | Freq: Two times a day (BID) | ORAL | Status: DC | PRN

## 2015-03-04 MED ORDER — KETOROLAC TROMETHAMINE 30 MG/ML IJ SOLN
15.0000 mg | Freq: Once | INTRAMUSCULAR | Status: AC
  Administered 2015-03-04: 15 mg via INTRAMUSCULAR

## 2015-03-04 MED ORDER — PREDNISONE 20 MG PO TABS: 40.0000 mg | ORAL_TABLET | Freq: Every day | ORAL | Status: DC

## 2015-03-04 NOTE — Assessment & Plan Note (Signed)
Etiology not clear. Doubt flea bites given that intense pruritis has lasted this long. Possibly due to tomato allergy, with reported history of sensitivity and recent increased exposure to tomato products. Discussed recommendation for punch biopsy today but patient prefers referral to dermatology instead. -refer to dermatology -prednisone 40mg  daily x 7 days (trial of higher dose prednisone) -avoid tomato products

## 2015-03-04 NOTE — Patient Instructions (Addendum)
Flu and toradol shots today Increased pain medicine back to 10-325mg  pills Referring to dermatology- someone will call you with apopintment  Follow up with me in 1 month, come next time without eating or drinking anything other than water so we can get labwork  Be well, Dr. [name]

## 2015-03-04 NOTE — Progress Notes (Signed)
Patient ID: SHAIMA SARDINAS, female   DOB: [birth year], 64 y.o.   MRN: [id number] Date of Visit: 03/04/2015   HPI:  Chronic pain - last visit we decreased norco to 7.5-325mg  one tab twice a day as needed (down from 10-325mg  tabs). Pain has worsened since then. She is having to use her cane much more often. Would like to go back up to 10-325mg . Pain located in L leg and low back. Denies having fever, saddle anesthesia, lower extremity weakness, or problems with stooling or urination.   Rash - still having significant rash of most of her body. Very pruritic. We thought these were most likely flea bites at her last visit, and I prescribed a 7 day course of prednisone 20mg . The prednisone provided no relief, and the rash is still very pruritic. Has it on arms, legs, stomach, and back. She is confident she does not continue to get new bumps, but continues to scratch old ones, pulling off scabs. No fevers, sores in mouth or genitals. She does note a history of sensitivity to tomato, which usually causes a rash, and has been eating a lot more tomato-containing products recently. No lesions on palms or soles of her feet.  HLD - needs refill on statin. Not fasting today. Denies CP or shortness of breath.  ROS: See HPI.  [place]: history of asthma, chronic pain, GERD, HLD, hypertension  PHYSICAL EXAM: BP 125/60 mmHg  Pulse 96  Temp(Src) 98.6 F (37 C) (Oral)  Ht 5\' 2"  (1.575 m)  Wt 170 lb (77.111 kg)  BMI 31.09 kg/m2 Gen: NAD, pleasant, cooperative HEENT: normocephalic, atraumatic. Poor dentition Heart: regular rate and rhythm no murmur Lungs: clear to auscultation bilaterally, NWOB Neuro: grossly nonfocal, speech normal Ext: full strength bilaterally lower extremities, sensation intact over bilaterally lower ext Skin: diffuse hyperpigmented papular rash on arms, legs, back, and stomach with some excoriations but no signs of superinfection. See photos below. Palms spared.  L lower back/flank:   R  arm:   ASSESSMENT/PLAN:  Health maintenance:  -flu shot given today   Encounter for chronic pain management Did not tolerate dose reduction to 7.5-325mg  pills. Will go back up to 10-325mg  pill. Patient has agreeable thus far to trial of decreasing doses. Reasonable to go back up to 10-325mg  twice daily. Requests toradol shot today - will give toradol 15mg  IM. Needs renal fxn checked at next visit, will not draw today as not fasting & also need to get cholesterol. Follow up in 1 month.  Hyperlipidemia Unable to draw lipids today as not fasting. Patient will come fasting to next appointment. In meantime will refill her statin.  Rash and nonspecific skin eruption Etiology not clear. Doubt flea bites given that intense pruritis has lasted this long. Possibly due to tomato allergy, with reported history of sensitivity and recent increased exposure to tomato products. Discussed recommendation for punch biopsy today but patient prefers referral to dermatology instead. -refer to dermatology -prednisone 40mg  daily x 7 days (trial of higher dose prednisone) -avoid tomato products   FOLLOW UP: F/u in 1 month for above issues. (needs labs that visit) Referring to dermatology.  [place]. [name], [place]

## 2015-03-04 NOTE — Assessment & Plan Note (Signed)
Did not tolerate dose reduction to 7.5-325mg  pills. Will go back up to 10-325mg  pill. Patient has agreeable thus far to trial of decreasing doses. Reasonable to go back up to 10-325mg  twice daily. Requests toradol shot today - will give toradol 15mg  IM. Needs renal fxn checked at next visit, will not draw today as not fasting & also need to get cholesterol. Follow up in 1 month.

## 2015-03-04 NOTE — Assessment & Plan Note (Signed)
Unable to draw lipids today as not fasting. Patient will come fasting to next appointment. In meantime will refill her statin.

## 2015-03-17 DIAGNOSIS — Z6828 Body mass index (BMI) 28.0-28.9, adult: Secondary | ICD-10-CM | POA: Diagnosis not present

## 2015-03-17 DIAGNOSIS — M4806 Spinal stenosis, lumbar region: Secondary | ICD-10-CM | POA: Diagnosis not present

## 2015-03-17 DIAGNOSIS — M5416 Radiculopathy, lumbar region: Secondary | ICD-10-CM | POA: Diagnosis not present

## 2015-04-05 ENCOUNTER — Encounter: Payer: Self-pay | Admitting: Family Medicine

## 2015-04-05 ENCOUNTER — Ambulatory Visit (INDEPENDENT_AMBULATORY_CARE_PROVIDER_SITE_OTHER): Payer: Commercial Managed Care - HMO | Admitting: Family Medicine

## 2015-04-05 VITALS — BP 138/60 | HR 79 | Temp 98.3°F | Ht 62.0 in | Wt 162.0 lb

## 2015-04-05 DIAGNOSIS — R21 Rash and other nonspecific skin eruption: Secondary | ICD-10-CM

## 2015-04-05 DIAGNOSIS — E785 Hyperlipidemia, unspecified: Secondary | ICD-10-CM

## 2015-04-05 DIAGNOSIS — Z7189 Other specified counseling: Secondary | ICD-10-CM

## 2015-04-05 DIAGNOSIS — I10 Essential (primary) hypertension: Secondary | ICD-10-CM

## 2015-04-05 DIAGNOSIS — J4520 Mild intermittent asthma, uncomplicated: Secondary | ICD-10-CM | POA: Diagnosis not present

## 2015-04-05 DIAGNOSIS — Z1159 Encounter for screening for other viral diseases: Secondary | ICD-10-CM

## 2015-04-05 DIAGNOSIS — G8929 Other chronic pain: Secondary | ICD-10-CM

## 2015-04-05 LAB — COMPREHENSIVE METABOLIC PANEL
ALK PHOS: 112 U/L (ref 33–130)
ALT: 19 U/L (ref 6–29)
AST: 22 U/L (ref 10–35)
Albumin: 4.1 g/dL (ref 3.6–5.1)
BILIRUBIN TOTAL: 0.3 mg/dL (ref 0.2–1.2)
BUN: 14 mg/dL (ref 7–25)
CALCIUM: 9.4 mg/dL (ref 8.6–10.4)
CO2: 23 mmol/L (ref 20–31)
Chloride: 101 mmol/L (ref 98–110)
Creat: 0.83 mg/dL (ref 0.50–0.99)
GLUCOSE: 88 mg/dL (ref 65–99)
POTASSIUM: 4.3 mmol/L (ref 3.5–5.3)
Sodium: 137 mmol/L (ref 135–146)
Total Protein: 7.7 g/dL (ref 6.1–8.1)

## 2015-04-05 LAB — LIPID PANEL
CHOL/HDL RATIO: 3.3 ratio (ref <=5.0)
Cholesterol: 169 mg/dL (ref 125–200)
HDL: 51 mg/dL (ref >=46)
LDL CALC: 100 mg/dL (ref <130)
Triglycerides: 90 mg/dL (ref <150)
VLDL: 18 mg/dL (ref <30)

## 2015-04-05 LAB — HEPATITIS C ANTIBODY: HCV AB: NEGATIVE

## 2015-04-05 MED ORDER — ALBUTEROL SULFATE HFA 108 (90 BASE) MCG/ACT IN AERS: 1.0000 | INHALATION_SPRAY | Freq: Four times a day (QID) | RESPIRATORY_TRACT | Status: DC | PRN

## 2015-04-05 MED ORDER — LISINOPRIL-HYDROCHLOROTHIAZIDE 20-25 MG PO TABS: 0.5000 | ORAL_TABLET | Freq: Every day | ORAL | Status: DC

## 2015-04-05 MED ORDER — HYDROCODONE-ACETAMINOPHEN 10-325 MG PO TABS: 1.0000 | ORAL_TABLET | Freq: Two times a day (BID) | ORAL | Status: DC | PRN

## 2015-04-05 MED ORDER — TRIAMCINOLONE ACETONIDE 0.025 % EX CREA: 1.0000 "application " | TOPICAL_CREAM | Freq: Two times a day (BID) | CUTANEOUS | Status: DC | PRN

## 2015-04-05 NOTE — Assessment & Plan Note (Signed)
Well controlled. Refill lisin-HCTZ. Check labs today: CMET, lipids.

## 2015-04-05 NOTE — Assessment & Plan Note (Signed)
Stable. Refill norco.

## 2015-04-05 NOTE — Patient Instructions (Addendum)
Refilled pain medicine today Checking labs: kidney, liver, cholesterol, hepatitis C screening test  Next visit in 1 month.   Be well, Dr. [name]

## 2015-04-05 NOTE — Progress Notes (Signed)
Date of Visit: 04/05/2015   HPI:  Pain - got cortisone shot in back. Planning to get these q81mo starting in January. Last visit we went back up to 10-325mg  tablets. Taking one tab twice a day. Pain now well controlled, needing to use cane less often.   hypertension - no CP or shortness of breath. Taking lisinopril-HCTZ 10-12.5mg  daily. Needs refill sent in today  hyperlipidemia - due for lipids today. See above ROS.   Asthma - uses albuterol more often recently with change of weather. Using it about twice a week presently. Needs refill today  Rash - had derm appointment on 10/27 but did not go to this because she financially had to choose between that appointment or the shot in her back. Has used her niece's triamcinolone cream with improvement in the rash. No new spots. No itching.  ROS: See HPI.  [place]: history of hyperlipidemia, tobacco abuse, hypertension, asthma, GERD, chronic pain  PHYSICAL EXAM: BP 138/60 mmHg  Pulse 79  Temp(Src) 98.3 F (36.8 C) (Oral)  Ht 5\' 2"  (1.575 m)  Wt 162 lb (73.483 kg)  BMI 29.62 kg/m2 Gen: NAD, pleasant, cooperative HEENT: NCAT Lungs: normal work of breathing  Neuro: alert, grossly nonfocal. Gait normal.   ASSESSMENT/PLAN:  Health maintenance:  -hep C screening antibody today  Encounter for chronic pain management Stable. Refill norco.   Asthma Well controlled. Refill albuterol today.  HYPERTENSION, BENIGN SYSTEMIC Well controlled. Refill lisin-HCTZ. Check labs today: CMET, lipids.  Hyperlipidemia Check lipids & CMET today, titrate statin as needed.  Rash and nonspecific skin eruption Counseled on using topical steroid sparingly rx for lowest potency triamcinolone given   FOLLOW UP: F/u in 1 month for chronic pain  [place] J. [name], [place]

## 2015-04-05 NOTE — Assessment & Plan Note (Signed)
Check lipids & CMET today, titrate statin as needed  

## 2015-04-05 NOTE — Assessment & Plan Note (Signed)
Counseled on using topical steroid sparingly rx for lowest potency triamcinolone given

## 2015-04-05 NOTE — Assessment & Plan Note (Signed)
Well controlled. Refill albuterol today.

## 2015-04-09 ENCOUNTER — Encounter: Payer: Self-pay | Admitting: Family Medicine

## 2015-04-09 ENCOUNTER — Other Ambulatory Visit: Payer: Self-pay | Admitting: Family Medicine

## 2015-04-09 MED ORDER — ATORVASTATIN CALCIUM 40 MG PO TABS: 40.0000 mg | ORAL_TABLET | Freq: Every day | ORAL | Status: DC

## 2015-05-13 ENCOUNTER — Encounter: Payer: Self-pay | Admitting: Family Medicine

## 2015-05-13 ENCOUNTER — Ambulatory Visit (INDEPENDENT_AMBULATORY_CARE_PROVIDER_SITE_OTHER): Payer: Commercial Managed Care - HMO | Admitting: Family Medicine

## 2015-05-13 VITALS — BP 121/62 | HR 87 | Temp 98.2°F | Ht 62.0 in | Wt 170.4 lb

## 2015-05-13 DIAGNOSIS — E669 Obesity, unspecified: Secondary | ICD-10-CM | POA: Insufficient documentation

## 2015-05-13 DIAGNOSIS — Z7189 Other specified counseling: Secondary | ICD-10-CM

## 2015-05-13 DIAGNOSIS — E785 Hyperlipidemia, unspecified: Secondary | ICD-10-CM

## 2015-05-13 DIAGNOSIS — R21 Rash and other nonspecific skin eruption: Secondary | ICD-10-CM | POA: Diagnosis not present

## 2015-05-13 DIAGNOSIS — G8929 Other chronic pain: Secondary | ICD-10-CM

## 2015-05-13 MED ORDER — HYDROCODONE-ACETAMINOPHEN 10-325 MG PO TABS: 1.0000 | ORAL_TABLET | Freq: Two times a day (BID) | ORAL | Status: DC | PRN

## 2015-05-13 MED ORDER — HYDROCORTISONE 2.5 % EX CREA: TOPICAL_CREAM | Freq: Two times a day (BID) | CUTANEOUS | Status: DC | PRN

## 2015-05-13 NOTE — Progress Notes (Signed)
Date of Visit: 05/13/2015   HPI:  Chronic pain: got letter from the back doctor that she owed $800. Is working with her insurance and that doctors office to straighten that out. Pain is doing well overall. Taking medicine one pill twice a day. Urinated shortly before this visit so reports she cannot give a urine sample today. Denies having fever, saddle anesthesia, lower extremity weakness, or problems with stooling or urination.   Rash - was unable to get triamcinolone as the copay was $20. Requests a cheaper topical steroid. No new spots on arms. Does itch some at night. Does not apply lotion regularly.  Weight gain - reports dietary indiscretions recently. Would like a handout on healthy diet.  Cholesterol - wants to review labs from last visit. cmet was normal. ASCVD risk score elevated so I sent in increased dose of atorvastatin for her. She has not yet received this from her mail order pharmacy. Prefers to wait until it comes in the mail to take the increased dose.   ROS: See HPI.  [place]: history of asthma, carpal tunnel, chronic L sciatica/low back pain, GERD, hyperlipidemia, hypertension  PHYSICAL EXAM: BP 121/62 mmHg  Pulse 87  Temp(Src) 98.2 F (36.8 C) (Oral)  Ht 5\' 2"  (1.575 m)  Wt 170 lb 6.4 oz (77.293 kg)  BMI 31.16 kg/m2 Gen: NAD, pleasant, cooperative HEENT: NCAT Lungs: NWOB Neuro: alert, grossly nonfocal, speech normal, ambulates well with cane Skin: flat hyperpigmented macules on arms. Some dry flaky skin present on extensor surface of arms. No erythema or skin breakdown.  ASSESSMENT/PLAN:  Encounter for chronic pain management Stable. Refill norco #60 tabs per month. Attempted UDS today but patient recently urinated. Will try again at future visit.  Hyperlipidemia Explained need for increased dose of lipitor. Reviewed labs with patient. She will start atorvastatin 40mg  daily once it arrives in the mail.  Rash and nonspecific skin eruption No new lesions. Has  some dry skin on arms, suspect that is the etiology of her itching. Recommend topical vaseline nightly for itching. Will rx hydrocortisone cream so she has it on an as needed basis.   Obesity Given handout on heart healthy diet.    FOLLOW UP: F/u in 1 month for chronic pain.  [name]. [name], [place]

## 2015-05-13 NOTE — Patient Instructions (Signed)
Heart-Healthy Eating Plan Many factors influence your heart health, including eating and exercise habits. Heart (coronary) risk increases with abnormal blood fat (lipid) levels. Heart-healthy meal planning includes limiting unhealthy fats, increasing healthy fats, and making other small dietary changes. This includes maintaining a healthy body weight to help keep lipid levels within a normal range. WHAT TYPES OF FAT SHOULD I CHOOSE?  Choose healthy fats more often. Choose monounsaturated and polyunsaturated fats, such as olive oil and canola oil, flaxseeds, walnuts, almonds, and seeds.  Eat more omega-3 fats. Good choices include salmon, mackerel, sardines, tuna, flaxseed oil, and ground flaxseeds. Aim to eat fish at least two times each week.  Limit saturated fats. Saturated fats are primarily found in animal products, such as meats, butter, and cream. Plant sources of saturated fats include palm oil, palm kernel oil, and coconut oil.  Avoid foods with partially hydrogenated oils in them. These contain trans fats. Examples of foods that contain trans fats are stick margarine, some tub margarines, cookies, crackers, and other baked goods. WHAT GENERAL GUIDELINES DO I NEED TO FOLLOW?  Check food labels carefully to identify foods with trans fats or high amounts of saturated fat.  Fill one half of your plate with vegetables and green salads. Eat 4-5 servings of vegetables per day. A serving of vegetables equals 1 cup of raw leafy vegetables,  cup of raw or cooked cut-up vegetables, or  cup of vegetable juice.  Fill one fourth of your plate with whole grains. Look for the word "whole" as the first word in the ingredient list.  Fill one fourth of your plate with lean protein foods.  Eat 4-5 servings of fruit per day. A serving of fruit equals one medium whole fruit,  cup of dried fruit,  cup of fresh, frozen, or canned fruit, or  cup of 100% fruit juice.  Eat more foods that contain  soluble fiber. Examples of foods that contain this type of fiber are apples, broccoli, carrots, beans, peas, and barley. Aim to get 20-30 g of fiber per day.  Eat more home-cooked food and less restaurant, buffet, and fast food.  Limit or avoid alcohol.  Limit foods that are high in starch and sugar.  Avoid fried foods.  Cook foods by using methods other than frying. Baking, boiling, grilling, and broiling are all great options. Other fat-reducing suggestions include:  Removing the skin from poultry.  Removing all visible fats from meats.  Skimming the fat off of stews, soups, and gravies before serving them.  Steaming vegetables in water or broth.  Lose weight if you are overweight. Losing just 5-10% of your initial body weight can help your overall health and prevent diseases such as diabetes and heart disease.  Increase your consumption of nuts, legumes, and seeds to 4-5 servings per week. One serving of dried beans or legumes equals  cup after being cooked, one serving of nuts equals 1 ounces, and one serving of seeds equals  ounce or 1 tablespoon.  You may need to monitor your salt (sodium) intake, especially if you have high blood pressure. Talk with your health care provider or dietitian to get more information about reducing sodium. WHAT FOODS CAN I EAT? Grains Breads, including [place], white, pita, wheat, raisin, rye, oatmeal, and [place]. Tortillas that are neither fried nor made with lard or trans fat. Low-fat rolls, including hotdog and hamburger buns and English muffins. Biscuits. Muffins. Waffles. Pancakes. Light popcorn. Whole-grain cereals. Flatbread. Melba toast. Pretzels. Breadsticks. Rusks. Low-fat  snacks and crackers, including oyster, saltine, matzo, graham, animal, and rye. Rice and pasta, including brown rice and those that are made with whole wheat. Vegetables All vegetables. Fruits All fruits, but limit coconut. Meats and Other Protein Sources Lean,  well-trimmed beef, veal, pork, and lamb. Chicken and [place] without skin. All fish and shellfish. Wild duck, rabbit, pheasant, and venison. Egg whites or low-cholesterol egg substitutes. Dried beans, peas, lentils, and tofu.Seeds and most nuts. Dairy Low-fat or nonfat cheeses, including ricotta, string, and mozzarella. Skim or 1% milk that is liquid, powdered, or evaporated. Buttermilk that is made with low-fat milk. Nonfat or low-fat yogurt. Beverages Mineral water. Diet carbonated beverages. Sweets and Desserts Sherbets and fruit ices. Honey, jam, marmalade, jelly, and syrups. Meringues and gelatins. Pure sugar candy, such as hard candy, jelly beans, gumdrops, mints, marshmallows, and small amounts of dark chocolate. [employer] Eat all sweets and desserts in moderation. Fats and Oils Nonhydrogenated (trans-free) margarines. Vegetable oils, including soybean, sesame, sunflower, olive, peanut, safflower, corn, canola, and cottonseed. Salad dressings or mayonnaise that are made with a vegetable oil. Limit added fats and oils that you use for cooking, baking, salads, and as spreads. Other Cocoa powder. Coffee and tea. All seasonings and condiments. The items listed above may not be a complete list of recommended foods or beverages. Contact your dietitian for more options. WHAT FOODS ARE NOT RECOMMENDED? Grains Breads that are made with saturated or trans fats, oils, or whole milk. Croissants. Butter rolls. Cheese breads. Sweet rolls. Donuts. Buttered popcorn. Chow mein noodles. High-fat crackers, such as cheese or butter crackers. Meats and Other Protein Sources Fatty meats, such as hotdogs, short ribs, sausage, spareribs, bacon, ribeye roast or steak, and mutton. High-fat deli meats, such as salami and bologna. Caviar. Domestic duck and goose. Organ meats, such as kidney, liver, sweetbreads, brains, gizzard, chitterlings, and heart. Dairy Cream, sour cream, cream cheese, and creamed cottage  cheese. Whole milk cheeses, including blue (bleu), Monterey Jack, [place], [place], American, [place], Swiss, [place], [place], and [place]. Whole or 2% milk that is liquid, evaporated, or condensed. Whole buttermilk. Cream sauce or high-fat cheese sauce. Yogurt that is made from whole milk. Beverages Regular sodas and drinks with added sugar. Sweets and Desserts Frosting. Pudding. Cookies. Cakes other than angel food cake. Candy that has milk chocolate or white chocolate, hydrogenated fat, butter, coconut, or unknown ingredients. Buttered syrups. Full-fat ice cream or ice cream drinks. Fats and Oils Gravy that has suet, meat fat, or shortening. Cocoa butter, hydrogenated oils, palm oil, coconut oil, palm kernel oil. These can often be found in baked products, candy, fried foods, nondairy creamers, and whipped toppings. Solid fats and shortenings, including bacon fat, salt pork, lard, and butter. Nondairy cream substitutes, such as coffee creamers and sour cream substitutes. Salad dressings that are made of unknown oils, cheese, or sour cream. The items listed above may not be a complete list of foods and beverages to avoid. Contact your dietitian for more information.   This information is not intended to replace advice given to you by your health care provider. Make sure you discuss any questions you have with your health care provider.   Document Released: 02/15/2008 Document Revised: 05/29/2014 Document Reviewed: 10/30/2013 Elsevier Interactive Patient Education [employer].

## 2015-05-13 NOTE — Assessment & Plan Note (Signed)
No new lesions. Has some dry skin on arms, suspect that is the etiology of her itching. Recommend topical vaseline nightly for itching. Will rx hydrocortisone cream so she has it on an as needed basis.

## 2015-05-13 NOTE — Assessment & Plan Note (Signed)
Explained need for increased dose of lipitor. Reviewed labs with patient. She will start atorvastatin 40mg  daily once it arrives in the mail.

## 2015-05-13 NOTE — Assessment & Plan Note (Signed)
Stable. Refill norco #60 tabs per month. Attempted UDS today but patient recently urinated. Will try again at future visit.

## 2015-05-13 NOTE — Assessment & Plan Note (Signed)
Given handout on heart healthy diet.

## 2015-06-07 ENCOUNTER — Ambulatory Visit (INDEPENDENT_AMBULATORY_CARE_PROVIDER_SITE_OTHER): Payer: Commercial Managed Care - HMO | Admitting: Family Medicine

## 2015-06-07 VITALS — BP 128/65 | HR 90 | Temp 98.7°F | Ht 62.0 in | Wt 163.2 lb

## 2015-06-07 DIAGNOSIS — Z7189 Other specified counseling: Secondary | ICD-10-CM

## 2015-06-07 DIAGNOSIS — M5432 Sciatica, left side: Secondary | ICD-10-CM

## 2015-06-07 DIAGNOSIS — G8929 Other chronic pain: Secondary | ICD-10-CM

## 2015-06-07 MED ORDER — HYDROCODONE-ACETAMINOPHEN 10-325 MG PO TABS: 1.0000 | ORAL_TABLET | Freq: Two times a day (BID) | ORAL | Status: DC | PRN

## 2015-06-07 MED ORDER — KETOROLAC TROMETHAMINE 30 MG/ML IJ SOLN
30.0000 mg | Freq: Once | INTRAMUSCULAR | Status: AC
  Administered 2015-06-07: 30 mg via INTRAMUSCULAR

## 2015-06-07 MED ORDER — KETOROLAC TROMETHAMINE 30 MG/ML IM SOLN: 30.0000 mg | Freq: Once | INTRAMUSCULAR | Status: DC

## 2015-06-07 NOTE — Assessment & Plan Note (Signed)
Stable with mild flare. Toradol shot today. Refill norco #60 pills. Follow up in 1 month. UDS at next visit - did not obtain today.

## 2015-06-07 NOTE — Progress Notes (Signed)
Date of Visit: 06/07/2015   HPI:  Patient presents for follow up of chronic pain.  Having flair of pain as she just traveled out of town for appx 6 days for her father in [place] in [place]. Just got back into town right before this appointment. Taking norco twice daily as prescribed, although took one extra dose on the day of the funeral. Denies having fever, saddle anesthesia, lower extremity weakness, or problems with stooling or urination. Would like toradol shot today to help with the pain.  ROS: See HPI.  [place]: history of L adrenal hyperplasia, asthma, carpal tunnel, chronic pain, GERD, hypertension, hyperlipidemia, tobacco abuse  PHYSICAL EXAM: BP 128/65 mmHg  Pulse 90  Temp(Src) 98.7 F (37.1 C) (Oral)  Ht 5\' 2"  (1.575 m)  Wt 163 lb 3.2 oz (74.027 kg)  BMI 29.84 kg/m2 Gen: NAD, pleasant, cooperative HEENT: NCAT Ext: full strength bilateral lower extremities. Sensation intact over bilateral lower extremities   ASSESSMENT/PLAN:  Encounter for chronic pain management Stable with mild flare. Toradol shot today. Refill norco #60 pills. Follow up in 1 month. UDS at next visit - did not obtain today.   FOLLOW UP: Follow up in 1 mo for chronic back pain  [place] J. [name], [place]

## 2015-06-07 NOTE — Patient Instructions (Signed)
toradol shot today Refilled pain medicine I'm sorry about your father in law.  Follow up in 1 month.  Be well, Dr. [name]

## 2015-06-18 ENCOUNTER — Telehealth: Payer: Self-pay | Admitting: Family Medicine

## 2015-06-18 DIAGNOSIS — G8929 Other chronic pain: Secondary | ICD-10-CM

## 2015-06-18 DIAGNOSIS — M549 Dorsalgia, unspecified: Principal | ICD-10-CM

## 2015-06-18 NOTE — Telephone Encounter (Signed)
Will forward to RN team. Jazmin Hartsell,CMA  

## 2015-06-18 NOTE — Telephone Encounter (Signed)
Pt called back with the name of the office she  Is going to. [facility] [address], [place], Dr. Satira Mccallum III. jw

## 2015-06-18 NOTE — Telephone Encounter (Signed)
Pt called and needs to speak to the nurse to explain what her insurance company is asking and how this needs to be written for her injections. It has ti say certain things. Please call patient to discuss. jw

## 2015-06-18 NOTE — Telephone Encounter (Signed)
Return call to patient regarding her insurance.  Patient stated she spoke with [facility] today and they told her that her PCP has new complete a new IPA for referral to [facility].  She can go 4 times a year for her steroid injections.  They would need to know how much of the steroid injection each month.  Patient's [employer] ID [id number].  Will forward to PCP.  Derl Barrow, RN

## 2015-06-18 NOTE — Telephone Encounter (Signed)
Referral entered. I do not know what an "IPA" is, neither did Jeannette when I spoke with her. I assume it's a new referral, so I entered one.  I am unable to answer specific questions about "how much of the steroid injection each month" as that is something that the neurosurgery office decides and offers to the patient. They might be able to answer this more clearly?  Let me know if there's more I need to do about this.  Thanks! Leeanne Rio, MD

## 2015-07-09 ENCOUNTER — Ambulatory Visit (INDEPENDENT_AMBULATORY_CARE_PROVIDER_SITE_OTHER): Payer: Commercial Managed Care - HMO | Admitting: Family Medicine

## 2015-07-09 VITALS — BP 119/63 | HR 80 | Temp 98.9°F | Ht 62.0 in | Wt 170.0 lb

## 2015-07-09 DIAGNOSIS — M549 Dorsalgia, unspecified: Secondary | ICD-10-CM

## 2015-07-09 DIAGNOSIS — Z7189 Other specified counseling: Secondary | ICD-10-CM | POA: Diagnosis not present

## 2015-07-09 DIAGNOSIS — G8929 Other chronic pain: Secondary | ICD-10-CM

## 2015-07-09 MED ORDER — HYDROCODONE-ACETAMINOPHEN 10-325 MG PO TABS: 1.0000 | ORAL_TABLET | Freq: Two times a day (BID) | ORAL | Status: DC | PRN

## 2015-07-09 MED ORDER — CYCLOBENZAPRINE HCL 10 MG PO TABS: 10.0000 mg | ORAL_TABLET | Freq: Three times a day (TID) | ORAL | Status: DC | PRN

## 2015-07-09 NOTE — Progress Notes (Signed)
Date of Visit: 07/09/2015   HPI:  Patient presents for follow up.  Chronic pain - well controlled on norco. Denies having fever, saddle anesthesia, lower extremity weakness, or problems with stooling or urination. Needs new referral to Dr. Brien Few authorizing injections up to 4x/yr. Takes ibuprofen about 2 times a week, takes with food. Last dose of hydrocodone was today, also took it yesterday.   Needs refills sent in on her flexeril, lipitor, gabapentin. No issues with these medications.   ROS: See HPI.  [place]: history of hyperlipidemia, gerd, asthma, hypertension, tobacco abuse, chronic pain on opioids  PHYSICAL EXAM: BP 119/63 mmHg  Pulse 80  Temp(Src) 98.9 F (37.2 C) (Oral)  Ht 5\' 2"  (1.575 m)  Wt 170 lb (77.111 kg)  BMI 31.09 kg/m2 Gen: NAD, pleasant, cooperative Neuro: gait normal, speech normal   ASSESSMENT/PLAN:  Encounter for chronic pain management Stable. Refill norco for 1 month. Attempted UDS today but patient adamant that she would not be able to give a urine sample, stated she did not have to go. Refused to drink water to help produce urine. Upon review of prior drug screens, some have been positive in the past for THC. Wonder if this is why she refused drug screen. Patient has not ever given indications that she is misuing her medication or diverting it. Will plan to try to check again in the future on a random visit.    FOLLOW UP: Follow up in 1 month for chronic pain.  [place]. [name], [place]

## 2015-07-09 NOTE — Patient Instructions (Signed)
Refilled pain medicine and flexeril I will send refills in to your pharmacy [facility])  New referral entered for your insurance  Follow up with me in 1 month  Be well, Dr. [name]

## 2015-07-11 MED ORDER — ATORVASTATIN CALCIUM 40 MG PO TABS: 40.0000 mg | ORAL_TABLET | Freq: Every day | ORAL | Status: DC

## 2015-07-11 MED ORDER — GABAPENTIN 800 MG PO TABS: 800.0000 mg | ORAL_TABLET | Freq: Three times a day (TID) | ORAL | Status: DC

## 2015-07-11 NOTE — Assessment & Plan Note (Signed)
Stable. Refill norco for 1 month. Attempted UDS today but patient adamant that she would not be able to give a urine sample, stated she did not have to go. Refused to drink water to help produce urine. Upon review of prior drug screens, some have been positive in the past for THC. Wonder if this is why she refused drug screen. Patient has not ever given indications that she is misuing her medication or diverting it. Will plan to try to check again in the future on a random visit.

## 2015-07-14 ENCOUNTER — Ambulatory Visit: Payer: Commercial Managed Care - HMO | Admitting: Family Medicine

## 2015-07-28 ENCOUNTER — Telehealth: Payer: Self-pay | Admitting: Family Medicine

## 2015-07-28 NOTE — Telephone Encounter (Signed)
Patient informed that she can call to set this appointment up herself.

## 2015-07-28 NOTE — Telephone Encounter (Signed)
Needs dr [name] to make a referral/appointment to Dr Delorise Jackson (she has it on her records) at [facility] for back injections. Please let ms Whedbee know when the appointment is

## 2015-07-28 NOTE — Telephone Encounter (Signed)
Phone  [phone]  Bartko is the dr name

## 2015-07-28 NOTE — Telephone Encounter (Signed)
The referrals were already entered. Is patient able to schedule the visits herself? If she's an established patient she should be able to do this. Our referrals were just for insurance purposes.  Leeanne Rio, MD

## 2015-08-11 DIAGNOSIS — M4806 Spinal stenosis, lumbar region: Secondary | ICD-10-CM | POA: Diagnosis not present

## 2015-08-11 DIAGNOSIS — M5416 Radiculopathy, lumbar region: Secondary | ICD-10-CM | POA: Diagnosis not present

## 2015-08-12 ENCOUNTER — Ambulatory Visit: Payer: Commercial Managed Care - HMO | Admitting: Family Medicine

## 2015-08-13 ENCOUNTER — Encounter: Payer: Self-pay | Admitting: Family Medicine

## 2015-08-13 ENCOUNTER — Ambulatory Visit (INDEPENDENT_AMBULATORY_CARE_PROVIDER_SITE_OTHER): Payer: Commercial Managed Care - HMO | Admitting: Family Medicine

## 2015-08-13 VITALS — BP 150/66 | HR 83 | Temp 98.3°F | Ht 62.0 in | Wt 164.0 lb

## 2015-08-13 DIAGNOSIS — G8929 Other chronic pain: Secondary | ICD-10-CM

## 2015-08-13 DIAGNOSIS — Z7189 Other specified counseling: Secondary | ICD-10-CM

## 2015-08-13 DIAGNOSIS — F1190 Opioid use, unspecified, uncomplicated: Secondary | ICD-10-CM | POA: Diagnosis not present

## 2015-08-13 MED ORDER — HYDROCODONE-ACETAMINOPHEN 10-325 MG PO TABS: 1.5000 | ORAL_TABLET | Freq: Two times a day (BID) | ORAL | Status: DC | PRN

## 2015-08-13 NOTE — Patient Instructions (Signed)
Follow up in 1 month We'll fix the referral Call [employer] to ask them to send [place] a refill request We are temporarily increasing the dose of your pain medicine since your back pain is flaring. Go up to one and a half pills twice a day.  Be well, Dr. [name]

## 2015-08-13 NOTE — Progress Notes (Signed)
Date of Visit: 08/13/2015   HPI:  Patient presents for routine follow up of chronic back pain.  Pain has increased lately due to having injection in back on Wednesday. Feels that norco is not helping much now. Denies having fever, saddle anesthesia, lower extremity weakness, or problems with stooling or urination. Would like to try temporary increase in dose of medication. Last dose of medicine was yesterday evening.  ROS: See HPI.  [place]: history of asthma, chronic pain on opioids, GERD, hyperlipidemia, hypertension, tobacco abuse  PHYSICAL EXAM: BP 150/66 mmHg  Pulse 83  Temp(Src) 98.3 F (36.8 C) (Oral)  Ht 5\' 2"  (1.575 m)  Wt 164 lb (74.39 kg)  BMI 29.99 kg/m2 Gen: NAD, pleasant, cooperative HEENT: normocephalic, atraumatic  Ext: full strength bilateral upper & lower extremities   ASSESSMENT/PLAN:  Encounter for chronic pain management Increase of pain related to epidural injection last week Temporarily increase strength of norco to 1.5pills twice daily (10-325mg ) Follow up in 1 month UDS today   FOLLOW UP: Follow up in 1 month for chronic pain  [place] J. [name], [place]

## 2015-08-14 LAB — DRUG SCR UR, PAIN MGMT, REFLEX CONF
AMPHETAMINE SCRN UR: NEGATIVE
BARBITURATE QUANT UR: NEGATIVE
BENZODIAZEPINES.: NEGATIVE
CREATININE, U: 89.67 mg/dL
Cocaine Metabolites: NEGATIVE
METHADONE: NEGATIVE
Marijuana Metabolite: NEGATIVE
PROPOXYPHENE: NEGATIVE
Phencyclidine (PCP): NEGATIVE

## 2015-08-15 NOTE — Assessment & Plan Note (Signed)
Increase of pain related to epidural injection last week Temporarily increase strength of norco to 1.5pills twice daily (10-325mg ) Follow up in 1 month UDS today

## 2015-08-18 LAB — OPIATES/OPIOIDS (LC/MS-MS)
Codeine Urine: NEGATIVE ng/mL (ref <50)
HYDROCODONE: 1158 ng/mL — AB (ref <50)
Hydromorphone: 395 ng/mL — ABNORMAL HIGH (ref <50)
Morphine Urine: NEGATIVE ng/mL (ref <50)
NOROXYCODONE, UR: NEGATIVE ng/mL (ref <50)
Norhydrocodone, Ur: 1230 ng/mL — ABNORMAL HIGH (ref <50)
OXYCODONE, UR: NEGATIVE ng/mL (ref <50)
Oxymorphone: NEGATIVE ng/mL (ref <50)

## 2015-09-01 ENCOUNTER — Telehealth: Payer: Self-pay | Admitting: Family Medicine

## 2015-09-01 NOTE — Telephone Encounter (Signed)
Patient asking for something sent in for pain and an antibiotic due to bad tooth.  Been having pain from it since last Sunday

## 2015-09-01 NOTE — Telephone Encounter (Signed)
Attempted to call patient to discuss. No answer. Left voicemail asking her to return the call. Leeanne Rio, MD

## 2015-09-02 MED ORDER — AMOXICILLIN-POT CLAVULANATE 875-125 MG PO TABS: 1.0000 | ORAL_TABLET | Freq: Two times a day (BID) | ORAL | Status: DC

## 2015-09-02 MED ORDER — AMOXICILLIN-POT CLAVULANATE 875-125 MG PO TABS
1.0000 | ORAL_TABLET | Freq: Two times a day (BID) | ORAL | Status: DC
Start: 2015-09-02 — End: 2015-09-02

## 2015-09-02 NOTE — Telephone Encounter (Signed)
Patient called back and I spoke with her. Reports pain in left lower area of jaw for the last 5 days. No fevers. Having trouble chewing on that side. Drinking okay but has to keep liquids away from that side. No redness of her face.  Thinks she has a dental abscess. Recommended she come in and be evaluated. Offered to see her here at the [facility] today but she is unable to get here by 5pm. We are closed tomorrow for Good Friday. Recommend she go to Urgent Care but she states she does not have money for UC.   Will send in rx for Augmentin for patient. She is already on chronic narcotics so no additional pain medications at this time. Discussed extensively with her that if the area worsens, has redness, she has fevers, or cannot drink liquids that she has to come in to be seen. She stated her understanding of this.  Leeanne Rio, MD

## 2015-09-02 NOTE — Telephone Encounter (Signed)
Pt is returning the doctors call from yesterday. Please call back so that she can know what to get OTC or antibiotics that could be called in. jw

## 2015-09-02 NOTE — Telephone Encounter (Signed)
Called patient again. No answer. Left another voicemail. Leeanne Rio, MD

## 2015-09-02 NOTE — Progress Notes (Signed)
Addendum for billing purposes G89.29 diagnosis code for urine drug screen (chronic pain)

## 2015-09-08 ENCOUNTER — Other Ambulatory Visit: Payer: Self-pay | Admitting: Family Medicine

## 2015-09-09 ENCOUNTER — Ambulatory Visit: Payer: Commercial Managed Care - HMO | Admitting: Family Medicine

## 2015-09-09 ENCOUNTER — Encounter: Payer: Self-pay | Admitting: Family Medicine

## 2015-09-09 ENCOUNTER — Ambulatory Visit (INDEPENDENT_AMBULATORY_CARE_PROVIDER_SITE_OTHER): Payer: Commercial Managed Care - HMO | Admitting: Family Medicine

## 2015-09-09 VITALS — BP 130/62 | HR 85 | Temp 98.7°F | Ht 62.0 in | Wt 169.6 lb

## 2015-09-09 DIAGNOSIS — Z23 Encounter for immunization: Secondary | ICD-10-CM

## 2015-09-09 DIAGNOSIS — I10 Essential (primary) hypertension: Secondary | ICD-10-CM | POA: Diagnosis not present

## 2015-09-09 DIAGNOSIS — K0889 Other specified disorders of teeth and supporting structures: Secondary | ICD-10-CM | POA: Diagnosis not present

## 2015-09-09 DIAGNOSIS — G8929 Other chronic pain: Secondary | ICD-10-CM

## 2015-09-09 DIAGNOSIS — Z7189 Other specified counseling: Secondary | ICD-10-CM | POA: Diagnosis not present

## 2015-09-09 MED ORDER — HYDROCODONE-ACETAMINOPHEN 10-325 MG PO TABS: 1.0000 | ORAL_TABLET | Freq: Two times a day (BID) | ORAL | Status: DC | PRN

## 2015-09-09 MED ORDER — ZOSTER VACCINE LIVE 19400 UNT/0.65ML ~~LOC~~ SOLR: 0.6500 mL | Freq: Once | SUBCUTANEOUS | Status: DC

## 2015-09-09 MED ORDER — TETANUS-DIPHTH-ACELL PERTUSSIS 5-2.5-18.5 LF-MCG/0.5 IM SUSP: 0.5000 mL | Freq: Once | INTRAMUSCULAR | Status: DC

## 2015-09-09 NOTE — Assessment & Plan Note (Signed)
Improved with augmentin. Fortunately she has a dental appointment scheduled to definitively manage this issue.

## 2015-09-09 NOTE — Assessment & Plan Note (Signed)
Well controlled. Decrease back down to 60 tabs per month (1 tab twice daily). [place] Controlled Substance Database reviewed, findings are appropriate.  UTD on UDS. Follow up in 1 month.

## 2015-09-09 NOTE — Progress Notes (Signed)
Date of Visit: 09/09/2015   HPI:  [name] presents for follow up.  Chronic pain - doing well. Taking norco 10-325mg  one and a half tabs twice daily. We had temporarily increased the dose of her medication last month as she was having a flare. Denies having fever, saddle anesthesia, lower extremity weakness, or problems with stooling or urination. Pain medication continues to work well and allows her to be functional throughout the day. She is amenable to decreasing the dose back down to 1 tab twice daily.  Dental pain - she had called in and requested rx for antibiotics for possible dental abscess. I sent in augmentin for her. Since starting the augmentin her pain is much better controlled and swelling has gone down. Now eating and drinking well. Has upcoming dental appointment on Monday to get this taken care of.  Hypertension - she is on lisinopril-HCTZ. Denies chest pain or shortness of breath   Health maintenance - has not yet gotten tdap or zoster vaccine. Due for prevnar 13 today. Has never had colonoscopy but is agreeable to doing this.  ROS: See HPI.  [place]: history of tobacco abuse, GERD, hypertension, hyperlipidemia, chronic pain on opioids  PHYSICAL EXAM: BP 130/62 mmHg  Pulse 85  Temp(Src) 98.7 F (37.1 C) (Oral)  Ht 5\' 2"  (1.575 m)  Wt 169 lb 9.6 oz (76.93 kg)  BMI 31.01 kg/m2 Gen: NAD, pleasant, cooperative HEENT: normocephalic, atraumatic, moist mucous membranes. Swelling and tenderness to left lower mandible. No trismus. Oropharynx clear and moist. Poor dentition throughout. Heart: regular rate and rhythm, no murmur Lungs: clear to auscultation bilaterally, normal work of breathing  Neuro: alert, grossly nonfocal, speech normal Ext: sensation intact over bilateral lower extremities. Full strength bilateral lower extremities   ASSESSMENT/PLAN:  Health maintenance:  -given handout on colonoscopy -prescriptions & handouts given for tdap and zostavax -prevnar 13 given  today -attempted to discuss dexa scan with patient but she wanted to delay this to another time  Encounter for chronic pain management Well controlled. Decrease back down to 60 tabs per month (1 tab twice daily). [place] Controlled Substance Database reviewed, findings are appropriate.  UTD on UDS. Follow up in 1 month.   HYPERTENSION, BENIGN SYSTEMIC Well controlled. Continue current regimen.   Tooth pain Improved with augmentin. Fortunately she has a dental appointment scheduled to definitively manage this issue.   FOLLOW UP: Follow up in 1 month for chronic pain Keep dental appointment   Tanzania J. [name], [place]

## 2015-09-09 NOTE — Patient Instructions (Signed)
See the handout on how to schedule your colonoscopy. This is an important screening test for colon cancer.   See handouts & prescriptions for shingles & tetanus vaccines  Getting pneumonia vaccine today  Refilled pain medicine  Good luck with the dentist appointment   Follow up in 1 month  Be well, Dr. [name]

## 2015-09-09 NOTE — Assessment & Plan Note (Signed)
Well-controlled.  Continue current regimen. 

## 2015-10-12 ENCOUNTER — Ambulatory Visit (INDEPENDENT_AMBULATORY_CARE_PROVIDER_SITE_OTHER): Payer: Commercial Managed Care - HMO | Admitting: Family Medicine

## 2015-10-12 ENCOUNTER — Encounter: Payer: Self-pay | Admitting: Family Medicine

## 2015-10-12 VITALS — BP 153/69 | HR 90 | Temp 98.2°F | Ht 62.0 in | Wt 163.1 lb

## 2015-10-12 DIAGNOSIS — K0889 Other specified disorders of teeth and supporting structures: Secondary | ICD-10-CM

## 2015-10-12 DIAGNOSIS — Z7189 Other specified counseling: Secondary | ICD-10-CM

## 2015-10-12 DIAGNOSIS — G8929 Other chronic pain: Secondary | ICD-10-CM

## 2015-10-12 MED ORDER — IBUPROFEN 800 MG PO TABS: 800.0000 mg | ORAL_TABLET | Freq: Every day | ORAL | Status: DC | PRN

## 2015-10-12 MED ORDER — AMOXICILLIN-POT CLAVULANATE 875-125 MG PO TABS: 1.0000 | ORAL_TABLET | Freq: Two times a day (BID) | ORAL | Status: DC

## 2015-10-12 MED ORDER — HYDROCODONE-ACETAMINOPHEN 10-325 MG PO TABS: 1.0000 | ORAL_TABLET | Freq: Two times a day (BID) | ORAL | Status: DC | PRN

## 2015-10-12 NOTE — Progress Notes (Signed)
Date of Visit: 10/12/2015   HPI:  [name] presents for follow up.  Pain - back/leg pain flaring up due to the bad weather recently. Taking norco twice daily. Comfortable with staying on this dose.  Needs ibuprofen filled, for no more than once daily use  Dental issues - got all of her top teeth pulled and now has dentures. Has an infected bottom tooth that is going to be removed tomorrow. She thinks she needs antibiotics. No fever but was very sweaty last night.   Health maintenance - still plans to work on getting tdap and shingles shots. Does not want to discuss dexa again today. Has colonoscopy handout already.  ROS: See HPI.  [place]: history of hypertension, chronic pain, hyperlipidemia, asthma, GERD, tobacco abuse  PHYSICAL EXAM: BP 153/69 mmHg  Pulse 90  Temp(Src) 98.2 F (36.8 C) (Oral)  Ht 5\' 2"  (1.575 m)  Wt 163 lb 1.6 oz (73.982 kg)  BMI 29.82 kg/m2 Gen: NAD, pleasant, cooperative HEENT: normocephalic, atraumatic, moist mucous membranes. No obvious fluctuance of gums though left lower gum is tender, no trismus or drooling  Neuro: alert, grossly nonfocal, speech normal Ext: full strength & sensation in bilateral lower extremities   ASSESSMENT/PLAN:  Encounter for chronic pain management Well controlled. Continue current regimen. Refill for 1 mo  Tooth pain rx augmentin again until she has tooth removed tomorrow  Health maintenance - continue to address at future visits.  FOLLOW UP: Follow up in 1 month  for chronic pain  T[place]ania J. [name], [place]

## 2015-10-12 NOTE — Patient Instructions (Signed)
It was great to see you again today!  Sent in augmentin to [facility] for your tooth Hopefully seeing the dentist tomorrow will help  Refilled pain medication for 1 month Sent in ibuprofen again - no more than once per day  See me in 1 month, sooner if needed  Be well, Dr. [name]

## 2015-10-13 ENCOUNTER — Ambulatory Visit: Payer: Commercial Managed Care - HMO | Admitting: Family Medicine

## 2015-10-13 NOTE — Assessment & Plan Note (Signed)
Well controlled. Continue current regimen. Refill for 1 mo

## 2015-10-13 NOTE — Assessment & Plan Note (Signed)
rx augmentin again until she has tooth removed tomorrow

## 2015-11-03 ENCOUNTER — Encounter: Payer: Self-pay | Admitting: Family Medicine

## 2015-11-03 ENCOUNTER — Ambulatory Visit (INDEPENDENT_AMBULATORY_CARE_PROVIDER_SITE_OTHER): Payer: Commercial Managed Care - HMO | Admitting: Family Medicine

## 2015-11-03 VITALS — BP 150/67 | HR 88 | Temp 97.8°F | Ht 62.0 in | Wt 164.0 lb

## 2015-11-03 DIAGNOSIS — M5432 Sciatica, left side: Secondary | ICD-10-CM

## 2015-11-03 DIAGNOSIS — Z7189 Other specified counseling: Secondary | ICD-10-CM | POA: Diagnosis not present

## 2015-11-03 DIAGNOSIS — G8929 Other chronic pain: Secondary | ICD-10-CM

## 2015-11-03 MED ORDER — KETOROLAC TROMETHAMINE 30 MG/ML IJ SOLN
30.0000 mg | Freq: Once | INTRAMUSCULAR | Status: AC
  Administered 2015-11-03: 30 mg via INTRAMUSCULAR

## 2015-11-03 MED ORDER — HYDROCODONE-ACETAMINOPHEN 10-325 MG PO TABS
1.0000 | ORAL_TABLET | Freq: Two times a day (BID) | ORAL | Status: DC | PRN
Start: 2015-11-03 — End: 2016-01-04

## 2015-11-03 NOTE — Progress Notes (Signed)
Date of Visit: 11/03/2015   HPI:  Patient presents for follow up of chronic pain.  Pain - having flare after doing some housework. Pain in low back. Taking norco 10-325mg  twice daily. Helps with her pain. No known injuries, just thinks she pulled a muscle.  Denies having fever, saddle anesthesia, lower extremity weakness, or problems with stooling or urination.   ROS: See HPI.  [place]: history of tobacco abuse, GERD, asthma, hyperlipidemia, hypertension, chronic pain on chronic opioids  PHYSICAL EXAM: BP 150/67 mmHg  Pulse 88  Temp(Src) 97.8 F (36.6 C) (Oral)  Ht 5\' 2"  (1.575 m)  Wt 164 lb (74.39 kg)  BMI 29.99 kg/m2 Gen: NAD, pleasant, cooperative HEENT: normocephalic, atraumatic  Back: tender to palpation in musculature of lower back Ext: full strength bilateral lower extremities, sensation intact over bilateral lower extremities   ASSESSMENT/PLAN:  Encounter for chronic pain management Flaring presently. Will give toradol 30mg  IM today. No NSAIDs for next few days. Refill norco for 1 month, space fills 30 days apart. Follow up in 1 month.    FOLLOW UP: Follow up in 1 mo for chronic pain  **Needs pap next visit**  Tanzania J. [name], [place]

## 2015-11-03 NOTE — Patient Instructions (Signed)
Toradol shot today Refilled pain medicine  I will see you in 1 month  Next visit you will need a pap smear  Be well, Dr. [name]

## 2015-11-04 NOTE — Assessment & Plan Note (Signed)
Flaring presently. Will give toradol 30mg  IM today. No NSAIDs for next few days. Refill norco for 1 month, space fills 30 days apart. Follow up in 1 month.

## 2015-11-24 ENCOUNTER — Ambulatory Visit (INDEPENDENT_AMBULATORY_CARE_PROVIDER_SITE_OTHER): Payer: Commercial Managed Care - HMO | Admitting: Family Medicine

## 2015-11-24 ENCOUNTER — Encounter: Payer: Self-pay | Admitting: Family Medicine

## 2015-11-24 ENCOUNTER — Other Ambulatory Visit (HOSPITAL_COMMUNITY)
Admission: RE | Admit: 2015-11-24 | Discharge: 2015-11-24 | Disposition: A | Discharge status: Home / Self Care | Payer: Commercial Managed Care - HMO | Source: Ambulatory Visit | Attending: Family Medicine | Admitting: Family Medicine

## 2015-11-24 VITALS — BP 148/73 | HR 90 | Temp 98.3°F | Ht 62.0 in | Wt 165.0 lb

## 2015-11-24 DIAGNOSIS — Z1151 Encounter for screening for human papillomavirus (HPV): Secondary | ICD-10-CM | POA: Diagnosis not present

## 2015-11-24 DIAGNOSIS — Z113 Encounter for screening for infections with a predominantly sexual mode of transmission: Secondary | ICD-10-CM | POA: Insufficient documentation

## 2015-11-24 DIAGNOSIS — M5432 Sciatica, left side: Secondary | ICD-10-CM | POA: Diagnosis not present

## 2015-11-24 DIAGNOSIS — Z7189 Other specified counseling: Secondary | ICD-10-CM

## 2015-11-24 DIAGNOSIS — B977 Papillomavirus as the cause of diseases classified elsewhere: Secondary | ICD-10-CM

## 2015-11-24 DIAGNOSIS — Z01419 Encounter for gynecological examination (general) (routine) without abnormal findings: Secondary | ICD-10-CM | POA: Diagnosis not present

## 2015-11-24 DIAGNOSIS — G8929 Other chronic pain: Secondary | ICD-10-CM

## 2015-11-24 DIAGNOSIS — E2839 Other primary ovarian failure: Secondary | ICD-10-CM

## 2015-11-24 MED ORDER — KETOROLAC TROMETHAMINE 30 MG/ML IJ SOLN
30.0000 mg | Freq: Once | INTRAMUSCULAR | Status: AC
  Administered 2015-11-24: 30 mg via INTRAMUSCULAR

## 2015-11-24 MED ORDER — HYDROCORTISONE 0.5 % EX CREA: 1.0000 "application " | TOPICAL_CREAM | Freq: Every day | CUTANEOUS | Status: DC | PRN

## 2015-11-24 NOTE — Assessment & Plan Note (Signed)
Stable in general, again having mild flare. Requests toradol shot today. Reasonable to do this. [place] Controlled Substance Database reviewed, findings are appropriate. Last fill on June 8. Has rx in hand from mid-June appointment. No need for rx today. Patient will follow up with me in early August, and plan for refill at that time.  Also of note patient going on a trip later in July, leaving on the 27th. She may want another toradol shot prior to her trip. Advised she can call for a same day appointment should her pain flare again prior to the trip, to see provider & decide on toradol shot.

## 2015-11-24 NOTE — Assessment & Plan Note (Signed)
Repeat pap/HPV cotest today.

## 2015-11-24 NOTE — Patient Instructions (Addendum)
Toradol shot today Come back for a same day visit later this month if you need a shot before your trip  Follow up with me in early August for pain medication refill  Pap smear today  Getting DEXA scan - bone density test to screen for osteoporosis  Schedule an annual wellness visit with Adonis Brook or Ander Purpura. This is a longer visit to focus on your goals and how to make you well. It is free.   Be well, Dr. [name]

## 2015-11-24 NOTE — Progress Notes (Signed)
Date of Visit: 11/24/2015   HPI:  Pap - here for repeat pap & HPV cotest. No vaginal bleeding, discharge, pelvic pain. Agreeable for STD testing with pap smear today.  Chronic pain - overall stable but mild flare. Reviewed safe storage with patient today - she keeps it in a bag in her nightstand, and her grandchildren do not go into her bedroom. Denies unwanted side effects from pain medications. Would like toradol shot today.  ROS: See HPI.  [place]: chronic pain, high risk HPV infection, obesity, hypertension, hyperlipidemia, asthma, GERD, tobacco abuse  PHYSICAL EXAM: BP 148/73 mmHg  Pulse 90  Temp(Src) 98.3 F (36.8 C) (Oral)  Ht 5\' 2"  (1.575 m)  Wt 165 lb (74.844 kg)  BMI 30.17 kg/m2 Gen: NAD, pleasant, cooperative HEENT: normocephalic, atraumatic, moist mucous membranes  GU: normal appearing external genitalia without lesions. Vagina is moist with white discharge. Cervix normal in appearance. No cervical motion tenderness or tenderness on bimanual exam. No adnexal masses.   ASSESSMENT/PLAN:  Health maintenance:  -DEXA scan ordered & scheduled today -again reminded patient of need for colonoscopy, shingles, Tdap  High risk HPV infection Repeat pap/HPV cotest today.  Encounter for chronic pain management Stable in general, again having mild flare. Requests toradol shot today. Reasonable to do this. [place] Controlled Substance Database reviewed, findings are appropriate. Last fill on June 8. Has rx in hand from mid-June appointment. No need for rx today. Patient will follow up with me in early August, and plan for refill at that time.  Also of note patient going on a trip later in July, leaving on the 27th. She may want another toradol shot prior to her trip. Advised she can call for a same day appointment should her pain flare again prior to the trip, to see provider & decide on toradol shot.   FOLLOW UP: Follow up in 1 month for chronic pain  T[place]ania J. [name], [place]

## 2015-11-25 ENCOUNTER — Other Ambulatory Visit: Payer: Self-pay | Admitting: *Deleted

## 2015-11-25 ENCOUNTER — Telehealth: Payer: Self-pay | Admitting: *Deleted

## 2015-11-25 LAB — CYTOLOGY - PAP

## 2015-11-25 MED ORDER — CYCLOBENZAPRINE HCL 10 MG PO TABS: 10.0000 mg | ORAL_TABLET | Freq: Three times a day (TID) | ORAL | Status: DC | PRN

## 2015-11-25 NOTE — Telephone Encounter (Signed)
Wants muscle relaxer refill sent into pharmacy.

## 2015-11-26 ENCOUNTER — Telehealth: Payer: Self-pay | Admitting: Family Medicine

## 2015-11-26 NOTE — Telephone Encounter (Signed)
Pap reviewed. Normal cytology, but persistent high risk HPV. Needs colposcopy. Discussed result with patient via phone. She wants to wait until August to have this done and does not want to schedule it right now. We will make sure it gets scheduled when she follows up with me in August.  Leeanne Rio, MD

## 2015-12-28 ENCOUNTER — Other Ambulatory Visit: Payer: Self-pay | Admitting: Family Medicine

## 2015-12-28 DIAGNOSIS — Z1231 Encounter for screening mammogram for malignant neoplasm of breast: Secondary | ICD-10-CM

## 2016-01-04 ENCOUNTER — Ambulatory Visit (INDEPENDENT_AMBULATORY_CARE_PROVIDER_SITE_OTHER): Payer: Commercial Managed Care - HMO | Admitting: Family Medicine

## 2016-01-04 ENCOUNTER — Encounter: Payer: Self-pay | Admitting: Family Medicine

## 2016-01-04 VITALS — BP 131/61 | HR 94 | Temp 98.7°F | Ht 62.0 in | Wt 165.2 lb

## 2016-01-04 DIAGNOSIS — B977 Papillomavirus as the cause of diseases classified elsewhere: Secondary | ICD-10-CM | POA: Diagnosis not present

## 2016-01-04 DIAGNOSIS — G8929 Other chronic pain: Secondary | ICD-10-CM

## 2016-01-04 DIAGNOSIS — Z7189 Other specified counseling: Secondary | ICD-10-CM | POA: Diagnosis not present

## 2016-01-04 MED ORDER — HYDROCODONE-ACETAMINOPHEN 10-325 MG PO TABS: 1.0000 | ORAL_TABLET | Freq: Two times a day (BID) | ORAL | 0 refills | Status: DC | PRN

## 2016-01-04 NOTE — Patient Instructions (Signed)
Great to see you today.  On the way out, please schedule a colposcopy here at the [facility]. These are done on Thursday mornings.  Refilled pain medication for 3 months  Follow up with me in 3 months, sooner if needed.  Be well, Dr. [name]

## 2016-01-04 NOTE — Progress Notes (Signed)
Date of Visit: 01/04/2016   HPI:  Patient presents today for routine follow up.  Chronic pain - well controlled presently. Medications help with function. Body is doin gwell. Gabapentin also helps, patient reports she can tell if she doesn't take it. She would like to space visits out to every 3 months, as she is no longer required to visit every month for disability purposes. Is busy and stressed with caring for her husband, who may be having a recurrence of his leukemia.  Pap smear follow up - needs colpo. Reminded patient to schedule this today. Questions answered.  ROS: See HPI.  [place]: history of chronic pain, hypertension, hyperlipidemia, asthma, GERD  PHYSICAL EXAM: BP 131/61   Pulse 94   Temp 98.7 F (37.1 C) (Oral)   Ht 5\' 2"  (1.575 m)   Wt 165 lb 3.2 oz (74.9 kg)   BMI 30.22 kg/m  Gen: NAD, pleasant, cooperaitve HEENT: normocephalic, atraumatic  Heart: regular rate and rhythm, no murmur Lungs: clear to auscultation bilaterally normal work of breathing  Neuro: alert grossly nofnocal speech normal Ext: No appreciable lower extremity edema bilaterally   ASSESSMENT/PLAN:  Encounter for chronic pain management Well controlled. Continue current regimen. Refill given for 3 months per patient request. Very reasonable. Follow up in 3 months.   High risk HPV infection Advised to schedule colpo appointment here in [place] clinic on a Thursday morning.  FOLLOW UP: Follow up in 3 months with me for chronic pain Schedule colpo clinic visit.  [place]. [name], [place]

## 2016-01-08 NOTE — Assessment & Plan Note (Signed)
Well controlled. Continue current regimen. Refill given for 3 months per patient request. Very reasonable. Follow up in 3 months.

## 2016-01-08 NOTE — Assessment & Plan Note (Signed)
Advised to schedule colpo appointment here in [place] clinic on a Thursday morning.

## 2016-01-14 ENCOUNTER — Ambulatory Visit
Admission: RE | Admit: 2016-01-14 | Discharge: 2016-01-14 | Disposition: A | Discharge status: Home / Self Care | Payer: Commercial Managed Care - HMO | Source: Ambulatory Visit | Attending: Family Medicine | Admitting: Family Medicine

## 2016-01-14 DIAGNOSIS — Z1231 Encounter for screening mammogram for malignant neoplasm of breast: Secondary | ICD-10-CM

## 2016-01-18 ENCOUNTER — Ambulatory Visit: Payer: Commercial Managed Care - HMO | Admitting: Family Medicine

## 2016-01-28 ENCOUNTER — Other Ambulatory Visit: Payer: Self-pay | Admitting: Family Medicine

## 2016-03-31 ENCOUNTER — Ambulatory Visit: Payer: Commercial Managed Care - HMO | Admitting: Internal Medicine

## 2016-04-12 ENCOUNTER — Ambulatory Visit (INDEPENDENT_AMBULATORY_CARE_PROVIDER_SITE_OTHER): Payer: Commercial Managed Care - HMO | Admitting: Internal Medicine

## 2016-04-12 ENCOUNTER — Encounter: Payer: Self-pay | Admitting: Internal Medicine

## 2016-04-12 DIAGNOSIS — G8929 Other chronic pain: Secondary | ICD-10-CM

## 2016-04-12 DIAGNOSIS — Z23 Encounter for immunization: Secondary | ICD-10-CM

## 2016-04-12 DIAGNOSIS — M549 Dorsalgia, unspecified: Secondary | ICD-10-CM | POA: Diagnosis not present

## 2016-04-12 DIAGNOSIS — G5601 Carpal tunnel syndrome, right upper limb: Secondary | ICD-10-CM | POA: Diagnosis not present

## 2016-04-12 MED ORDER — HYDROCODONE-ACETAMINOPHEN 10-325 MG PO TABS: 1.0000 | ORAL_TABLET | Freq: Two times a day (BID) | ORAL | 0 refills | Status: DC | PRN

## 2016-04-12 MED ORDER — CYCLOBENZAPRINE HCL 10 MG PO TABS: 10.0000 mg | ORAL_TABLET | Freq: Three times a day (TID) | ORAL | 1 refills | Status: DC | PRN

## 2016-04-12 MED ORDER — KETOROLAC TROMETHAMINE 60 MG/2ML IM SOLN
60.0000 mg | Freq: Once | INTRAMUSCULAR | Status: AC
  Administered 2016-04-12: 60 mg via INTRAMUSCULAR

## 2016-04-12 MED ORDER — GABAPENTIN 800 MG PO TABS: 800.0000 mg | ORAL_TABLET | Freq: Three times a day (TID) | ORAL | 1 refills | Status: DC

## 2016-04-12 MED ORDER — LISINOPRIL-HYDROCHLOROTHIAZIDE 20-25 MG PO TABS: 0.5000 | ORAL_TABLET | Freq: Every day | ORAL | 1 refills | Status: DC

## 2016-04-12 MED ORDER — ATORVASTATIN CALCIUM 40 MG PO TABS: 40.0000 mg | ORAL_TABLET | Freq: Every day | ORAL | 3 refills | Status: DC

## 2016-04-12 NOTE — Assessment & Plan Note (Signed)
Possible flare of symptoms given distribution of pain. Full strength. Patient has been prescribed wrist splints before (documented in 2015), but says she has never used them. Will write prescription for splints again today. Instructed patient to use at night and during the day as well if she feels it helps with the pain.

## 2016-04-12 NOTE — Progress Notes (Signed)
   Subjective:    Patient ID: [name], female    DOB: 29-Oct-1950, 65 y.o.   MRN: [id number]  HPI  Patient presents for chronic pain follow up and carpal tunnel symptoms.   Chronic back pain Patient reports generally well-controlled pain, but recent flare of lower back pain. Says she was at a long funeral five days ago sitting in a stiff pew without back support and has had increased pain since then. She describes the pain as aching. Denies numbness or weakness in lower extremities. Denies saddle anesthesia. Denies bowel or bladder incontinence. Reports pain more prominent on the R but says it does travel to the L at times.  Is currently taking gabapentin 800mg  TID, Noroc 10-325mg  BID, and Flexeril 10mg  TID per day. She is requesting Toradol shot for flare today.   Sharp pain in R fingers Patient says she was diagnosed with carpal tunnel syndrome many years ago, and thinks she is experiencing symptoms again. Reports a shooting pain at the tips of digits 3-5 on her R hand. Denies numbness. Says pain has worsened in past three weeks. Has not tried anything for the pain other than her usual medications. Says she has never had wrist splints before.   Smoking status reviewed.   Review of Systems See HPI.     Objective:   Physical Exam  Constitutional: She is oriented to person, place, and time. She appears well-developed and well-nourished. No distress.  HENT:  Head: Normocephalic and atraumatic.  Eyes: Conjunctivae and EOM are normal. Right eye exhibits no discharge. Left eye exhibits no discharge.  Pulmonary/Chest: Effort normal. No respiratory distress.  Musculoskeletal:  Mild TTP of lower back R>L most consistent with MSK pain. Able to sit, stand, and walk unassisted. No gait abnormalities. 5/5 strength upper extremities bilaterally.   Neurological: She is alert and oriented to person, place, and time.  Psychiatric: She has a normal mood and affect. Her behavior is normal.        Assessment & Plan:  Carpal tunnel syndrome Possible flare of symptoms given distribution of pain. Full strength. Patient has been prescribed wrist splints before (documented in 2015), but says she has never used them. Will write prescription for splints again today. Instructed patient to use at night and during the day as well if she feels it helps with the pain.   Encounter for chronic pain management Well-controlled on current regimen.  - Toradol shot in office today given flare of back pain - Continue gabapentin 800mg  TID - Continue Norco 10-325mg  BID (3 refills given) - Continue Flexeril 10mg  TID - F/u in 3 months   [name], MD, MPH PGY-2 [facility] Pager [phone]

## 2016-04-12 NOTE — Patient Instructions (Addendum)
It was nice meeting you today Ms. Mella!  Please take your prescription for wrist splints to a home health store. It is important to wear the splints on both hands especially at night. You can wear them during the day if you think it helps your symptoms.   Please continue to take your other medications as you have been.   We will see you back in three months.   If you have any questions or concerns, please feel free to call the clinic.   Be well,  Dr. Avon Gully

## 2016-04-12 NOTE — Assessment & Plan Note (Signed)
Well-controlled on current regimen.  - Toradol shot in office today given flare of back pain - Continue gabapentin 800mg  TID - Continue Norco 10-325mg  BID (3 refills given) - Continue Flexeril 10mg  TID - F/u in 3 months

## 2016-06-30 ENCOUNTER — Other Ambulatory Visit: Payer: Self-pay | Admitting: Family Medicine

## 2016-07-12 ENCOUNTER — Telehealth: Payer: Self-pay | Admitting: Family Medicine

## 2016-07-17 ENCOUNTER — Ambulatory Visit (INDEPENDENT_AMBULATORY_CARE_PROVIDER_SITE_OTHER): Payer: Medicare HMO | Admitting: Family Medicine

## 2016-07-17 ENCOUNTER — Encounter: Payer: Self-pay | Admitting: Family Medicine

## 2016-07-17 VITALS — BP 126/68 | HR 88 | Temp 97.9°F | Ht 62.0 in | Wt 170.0 lb

## 2016-07-17 DIAGNOSIS — B977 Papillomavirus as the cause of diseases classified elsewhere: Secondary | ICD-10-CM

## 2016-07-17 DIAGNOSIS — G8929 Other chronic pain: Secondary | ICD-10-CM

## 2016-07-17 DIAGNOSIS — I10 Essential (primary) hypertension: Secondary | ICD-10-CM | POA: Diagnosis not present

## 2016-07-17 DIAGNOSIS — E785 Hyperlipidemia, unspecified: Secondary | ICD-10-CM | POA: Diagnosis not present

## 2016-07-17 LAB — COMPLETE METABOLIC PANEL WITH GFR
ALT: 16 U/L (ref 6–29)
AST: 21 U/L (ref 10–35)
Albumin: 4.4 g/dL (ref 3.6–5.1)
Alkaline Phosphatase: 105 U/L (ref 33–130)
BILIRUBIN TOTAL: 0.3 mg/dL (ref 0.2–1.2)
BUN: 13 mg/dL (ref 7–25)
CHLORIDE: 103 mmol/L (ref 98–110)
CO2: 26 mmol/L (ref 20–31)
Calcium: 9.7 mg/dL (ref 8.6–10.4)
Creat: 0.95 mg/dL (ref 0.50–0.99)
GFR, EST AFRICAN AMERICAN: 72 mL/min (ref >=60)
GFR, EST NON AFRICAN AMERICAN: 63 mL/min (ref >=60)
GLUCOSE: 86 mg/dL (ref 65–99)
POTASSIUM: 4.5 mmol/L (ref 3.5–5.3)
SODIUM: 136 mmol/L (ref 135–146)
Total Protein: 7.7 g/dL (ref 6.1–8.1)

## 2016-07-17 LAB — LIPID PANEL
CHOL/HDL RATIO: 3.6 ratio (ref <5.0)
Cholesterol: 153 mg/dL (ref <200)
HDL: 43 mg/dL — AB (ref >50)
LDL CALC: 76 mg/dL (ref <100)
TRIGLYCERIDES: 168 mg/dL — AB (ref <150)
VLDL: 34 mg/dL — AB (ref <30)

## 2016-07-17 MED ORDER — TETANUS-DIPHTH-ACELL PERTUSSIS 5-2.5-18.5 LF-MCG/0.5 IM SUSP: 0.5000 mL | Freq: Once | INTRAMUSCULAR | 0 refills | Status: DC

## 2016-07-17 MED ORDER — CYCLOBENZAPRINE HCL 10 MG PO TABS: 10.0000 mg | ORAL_TABLET | Freq: Three times a day (TID) | ORAL | 1 refills | Status: DC | PRN

## 2016-07-17 MED ORDER — KETOROLAC TROMETHAMINE 30 MG/ML IJ SOLN
30.0000 mg | Freq: Once | INTRAMUSCULAR | Status: AC
  Administered 2016-07-17: 30 mg via INTRAMUSCULAR

## 2016-07-17 MED ORDER — IBUPROFEN 800 MG PO TABS: ORAL_TABLET | ORAL | 1 refills | Status: DC

## 2016-07-17 MED ORDER — ATORVASTATIN CALCIUM 40 MG PO TABS: 40.0000 mg | ORAL_TABLET | Freq: Every day | ORAL | 3 refills | Status: DC

## 2016-07-17 MED ORDER — GABAPENTIN 800 MG PO TABS: 800.0000 mg | ORAL_TABLET | Freq: Three times a day (TID) | ORAL | 3 refills | Status: DC

## 2016-07-17 MED ORDER — TRAMADOL HCL 50 MG PO TABS: 50.0000 mg | ORAL_TABLET | Freq: Three times a day (TID) | ORAL | 0 refills | Status: DC | PRN

## 2016-07-17 MED ORDER — TETANUS-DIPHTH-ACELL PERTUSSIS 5-2.5-18.5 LF-MCG/0.5 IM SUSP: 0.5000 mL | Freq: Once | INTRAMUSCULAR | 0 refills | Status: AC

## 2016-07-17 MED ORDER — HYDROCORTISONE 0.5 % EX CREA: 1.0000 "application " | TOPICAL_CREAM | Freq: Every day | CUTANEOUS | 0 refills | Status: DC | PRN

## 2016-07-17 MED ORDER — LISINOPRIL-HYDROCHLOROTHIAZIDE 20-25 MG PO TABS: 0.5000 | ORAL_TABLET | Freq: Every day | ORAL | 3 refills | Status: DC

## 2016-07-17 NOTE — Progress Notes (Signed)
Date of Visit: 07/17/2016   HPI:  Patient presents for follow up  Pain medications - needs refills. Currently taking norco 10-325mg  twice daily. Pain has flared recently, would like toradol shot today. Willing to try decreasing her pain regimen down to tramadol as a trial. Needs refill of flexeril and ibuprofen. Overall pain is stable.  Hyperlipidemia - fasting today. Currently taking lipitor 40mg  daily. No chest pain or shortness of breath.  History of HPV - reminded patient she is due for colpo. She has not scheduled this yet.  Hydrocortisone refill - wants refill of hydrocortisone cream to have on hand just in case she needs it for occasional itching.  Hypertension - taking lisinopril-hctz 10-12.5mg  daily (takes half of 20-25 tablet). Tolerating well.  ROS: See HPI.  [place]: hyperlipidemia, hypertension, asthma, GERD  PHYSICAL EXAM: BP 126/68   Pulse 88   Temp 97.9 F (36.6 C) (Oral)   Ht 5\' 2"  (1.575 m)   Wt 170 lb (77.1 kg)   BMI 31.09 kg/m  Gen: no acute distress, pleasant, cooperative HEENT: normocephalic, atraumatic, moist mucous membranes  Heart: regular rate and rhythm, no murmur Lungs: clear to auscultation bilaterally normal work of breathing  Neuro: grossly nonfocal, speech normal Ext: No appreciable lower extremity edema bilaterally   ASSESSMENT/PLAN:  Health maintenance:  -given rx for Tdap  Hyperlipidemia Check lipids & CMET today, titrate statin as needed   HYPERTENSION, BENIGN SYSTEMIC Well controlled. Continue current regimen.   Encounter for chronic pain management UTD on UDS. [place] Controlled Substance Database reviewed, findings are appropriate.  Trial of switching to tramadol 50mg  q8h - similar morphine equivalent dose (15mg  vs currently 20mg ). Patient will let us know if this change does not work out for her and if so I can refill the prior norco. Follow up in 1 month to evaluate after this change.   High risk HPV infection Reminded patient  of need to schedule colpo.  FOLLOW UP: Follow up in 1 mo for pain Schedule in colpo clinic  T[place] J. [name], [place]

## 2016-07-17 NOTE — Patient Instructions (Addendum)
Schedule colposcopy  In women's health clinic here at [facility] for a Thursday morning.  Stopping norco. Switch to tramadol 50mg  every 8 hours as needed for pain. Call us if this doesn't work out.  Labs today - cholesterol, kidneys, liver  See prescription for tetanus vaccine  toradol shot today  Follow up with me in 1 month  Be well, Dr. [name]

## 2016-07-21 ENCOUNTER — Encounter: Payer: Self-pay | Admitting: Family Medicine

## 2016-07-21 NOTE — Assessment & Plan Note (Addendum)
UTD on UDS.  Controlled Substance Database reviewed, findings are appropriate.  Trial of switching to tramadol 50mg  q8h - similar morphine equivalent dose (15mg  vs currently 20mg ). Patient will let us know if this change does not work out for her and if so I can refill the prior norco. Follow up in 1 month to evaluate after this change.

## 2016-07-21 NOTE — Assessment & Plan Note (Signed)
Reminded patient of need to schedule colpo.

## 2016-07-21 NOTE — Assessment & Plan Note (Signed)
Check lipids & CMET today, titrate statin as needed

## 2016-07-21 NOTE — Assessment & Plan Note (Signed)
Well-controlled.  Continue current regimen. 

## 2016-08-17 ENCOUNTER — Other Ambulatory Visit: Payer: Self-pay | Admitting: Family Medicine

## 2016-08-17 NOTE — Telephone Encounter (Signed)
Pt states the tramadol worked well and would like a 3 month supply. Pt only has enough for tonight. Please call pt when it is ready for pick up. ep

## 2016-08-21 MED ORDER — TRAMADOL HCL 50 MG PO TABS: 50.0000 mg | ORAL_TABLET | Freq: Three times a day (TID) | ORAL | 2 refills | Status: DC | PRN

## 2016-08-21 NOTE — Telephone Encounter (Signed)
Pt called to check the status of her refill request for Tramadol. Please call and let her know. jw

## 2016-08-21 NOTE — Telephone Encounter (Signed)
Please let patient know that I am glad that the tramadol is working well. I have refilled this and it is at the front desk for her.  Make sure she realizes that it will just be one prescription with 2 refills (unlike her prior Norco, we can do refills on tramadol without separate prescriptions).  Thanks Leeanne Rio, MD

## 2016-08-22 NOTE — Telephone Encounter (Signed)
Left message on patient voicemail informing that rx was ready to be picked up.

## 2016-11-21 ENCOUNTER — Encounter: Payer: Self-pay | Admitting: Obstetrics and Gynecology

## 2016-11-21 ENCOUNTER — Ambulatory Visit (INDEPENDENT_AMBULATORY_CARE_PROVIDER_SITE_OTHER): Payer: Medicare HMO | Admitting: Obstetrics and Gynecology

## 2016-11-21 VITALS — BP 118/62 | HR 96 | Temp 98.3°F | Ht 62.0 in | Wt 157.0 lb

## 2016-11-21 DIAGNOSIS — R232 Flushing: Secondary | ICD-10-CM

## 2016-11-21 DIAGNOSIS — M5432 Sciatica, left side: Secondary | ICD-10-CM | POA: Diagnosis not present

## 2016-11-21 DIAGNOSIS — G8929 Other chronic pain: Secondary | ICD-10-CM

## 2016-11-21 MED ORDER — TRAMADOL HCL 50 MG PO TABS: 50.0000 mg | ORAL_TABLET | Freq: Three times a day (TID) | ORAL | 0 refills | Status: DC | PRN

## 2016-11-21 MED ORDER — CYCLOBENZAPRINE HCL 10 MG PO TABS: 10.0000 mg | ORAL_TABLET | Freq: Three times a day (TID) | ORAL | 1 refills | Status: DC | PRN

## 2016-11-21 MED ORDER — KETOROLAC TROMETHAMINE 30 MG/ML IJ SOLN
30.0000 mg | Freq: Once | INTRAMUSCULAR | Status: AC
  Administered 2016-11-21: 30 mg via INTRAMUSCULAR

## 2016-11-21 NOTE — Patient Instructions (Addendum)
Medications refilled  For hot flashes - medication called black cohosh  Please follow-up with PCP as needed   Menopause Menopause is the normal time of life when menstrual periods stop completely. Menopause is complete when you have missed 12 consecutive menstrual periods. It usually occurs between the ages of 32 years and 21 years. Very rarely does a woman develop menopause before the age of 20 years. At menopause, your ovaries stop producing the female hormones estrogen and progesterone. This can cause undesirable symptoms and also affect your health. Sometimes the symptoms may occur 4-5 years before the menopause begins. There is no relationship between menopause and:  Oral contraceptives.  Number of children you had.  Race.  The age your menstrual periods started (menarche).  Heavy smokers and very thin women may develop menopause earlier in life. What are the causes?  The ovaries stop producing the female hormones estrogen and progesterone. Other causes include:  Surgery to remove both ovaries.  The ovaries stop functioning for no known reason.  Tumors of the pituitary gland in the brain.  Medical disease that affects the ovaries and hormone production.  Radiation treatment to the abdomen or pelvis.  Chemotherapy that affects the ovaries.  What are the signs or symptoms?  Hot flashes.  Night sweats.  Decrease in sex drive.  Vaginal dryness and thinning of the vagina causing painful intercourse.  Dryness of the skin and developing wrinkles.  Headaches.  Tiredness.  Irritability.  Memory problems.  Weight gain.  Bladder infections.  Hair growth of the face and chest.  Infertility. More serious symptoms include:  Loss of bone (osteoporosis) causing breaks (fractures).  Depression.  Hardening and narrowing of the arteries (atherosclerosis) causing heart attacks and strokes.  How is this diagnosed?  When the menstrual periods have stopped for 12  straight months.  Physical exam.  Hormone studies of the blood. How is this treated? There are many treatment choices and nearly as many questions about them. The decisions to treat or not to treat menopausal changes is an individual choice made with your health care provider. Your health care provider can discuss the treatments with you. Together, you can decide which treatment will work best for you. Your treatment choices may include:  Hormone therapy (estrogen and progesterone).  Non-hormonal medicines.  Treating the individual symptoms with medicine (for example antidepressants for depression).  Herbal medicines that may help specific symptoms.  Counseling by a psychiatrist or psychologist.  Group therapy.  Lifestyle changes including: ? Eating healthy. ? Regular exercise. ? Limiting caffeine and alcohol. ? Stress management and meditation.  No treatment.  Follow these instructions at home:  Take the medicine your health care provider gives you as directed.  Get plenty of sleep and rest.  Exercise regularly.  Eat a diet that contains calcium (good for the bones) and soy products (acts like estrogen hormone).  Avoid alcoholic beverages.  Do not smoke.  If you have hot flashes, dress in layers.  Take supplements, calcium, and vitamin D to strengthen bones.  You can use over-the-counter lubricants or moisturizers for vaginal dryness.  Group therapy is sometimes very helpful.  Acupuncture may be helpful in some cases. Contact a health care provider if:  You are not sure you are in menopause.  You are having menopausal symptoms and need advice and treatment.  You are still having menstrual periods after age 68 years.  You have pain with intercourse.  Menopause is complete (no menstrual period for 12 months) and you  develop vaginal bleeding.  You need a referral to a specialist (gynecologist, psychiatrist, or psychologist) for treatment. Get help right  away if:  You have severe depression.  You have excessive vaginal bleeding.  You fell and think you have a broken bone.  You have pain when you urinate.  You develop leg or chest pain.  You have a fast pounding heart beat (palpitations).  You have severe headaches.  You develop vision problems.  You feel a lump in your breast.  You have abdominal pain or severe indigestion. This information is not intended to replace advice given to you by your health care provider. Make sure you discuss any questions you have with your health care provider. Document Released: 07/29/2003 Document Revised: 10/14/2015 Document Reviewed: 12/05/2012 Elsevier Interactive Patient Education  2017 [employer].

## 2016-11-21 NOTE — Progress Notes (Signed)
    Subjective: Chief Complaint  Patient presents with  . Medication Refill     HPI: Raven [name] is a 66 y.o. presenting to clinic today for medication refills. Patient has chronic pain. Pain is located in lower back with associated left sided sciatica. She has ran out of her 3 month supply of pain medications. She takes tramadol, flexeril, and gabapentin for pain. She also gets occasional Toradol injections. Requesting injection today. Pain severity today 7/10. Denies any weakness, falls, incontinence, fevers.   Patient's daughter whom accompanies patient to visit states that she is concerned about her mom's hot flashes. She has been having hot flashes for years. She will have perfuse sweating. Episodes last 1-2 hours. Do not occur daily. Patient has all of her gynecological organs. Denies chills, weight loss, masses, and appetite changes. States she does not want any hormonal treatment or medications.   No other acute concerns voiced.   ROS noted in HPI.   Past Medical, Surgical, Social, and Family History Reviewed & Updated per EMR.    History  Smoking Status  . Current Every Day Smoker  . Packs/day: 0.30  . Types: Cigarettes  Smokeless Tobacco  . Never Used    Comment: will quitt this year    Objective: BP 118/62   Pulse 96   Temp 98.3 F (36.8 C) (Oral)   Ht 5\' 2"  (1.575 m)   Wt 157 lb (71.2 kg)   SpO2 95%   BMI 28.72 kg/m  Vitals and nursing notes reviewed  Physical Exam Gen: no acute distress, pleasant, cooperative HEENT: normocephalic, atraumatic, moist mucous membranes  Heart: regular rate and rhythm, no murmur, no edema Lungs: clear to auscultation bilaterally normal work of breathing  Neuro: grossly nonfocal, speech normal, no weakness  Assessment/Plan: 1. Encounter for chronic pain management Managed by PCP. Refills given today on tramadol and flexeril. Patient stable on this regimen.   2. Sciatica of left side Chronic. Toradol injection given today  for pain.  - ketorolac (TORADOL) 30 MG/ML injection 30 mg; Inject 1 mL (30 mg total) into the muscle once.  3. Hot flashes Most likely secondary to menopause. No red flags. Patient is not up to date on all her health maintenance items. Reviewed weight trend and patient is down 13lbs from last visit 5 months ago. Will need to monitor. Encouraged preventative screenings. Discussed use of herbal medication black cohosh to see if this will help with symptoms.   PATIENT EDUCATION PROVIDED: See AVS    Meds ordered this encounter  Medications  . cyclobenzaprine (FLEXERIL) 10 MG tablet    Sig: Take 1 tablet (10 mg total) by mouth 3 (three) times daily as needed for muscle spasms.    Dispense:  90 tablet    Refill:  1  . traMADol (ULTRAM) 50 MG tablet    Sig: Take 1 tablet (50 mg total) by mouth every 8 (eight) hours as needed for severe pain.    Dispense:  90 tablet    Refill:  0    Space refills 30 days apart  . ketorolac (TORADOL) 30 MG/ML injection 30 mg    Luiz Blare, DO 11/21/2016, 8:39 AM PGY-3, [place]

## 2017-01-09 ENCOUNTER — Other Ambulatory Visit: Payer: Self-pay | Admitting: *Deleted

## 2017-01-09 NOTE — Telephone Encounter (Signed)
Patient called for refill on Tramadol. She is leaving to go out of town Thursday for sister's surgery.  Derl Barrow, RN

## 2017-01-09 NOTE — Telephone Encounter (Signed)
Pt needs her Rx for tramadol for Aug and Sept.  She leaves Thursday for [place].  Please advise

## 2017-01-10 MED ORDER — TRAMADOL HCL 50 MG PO TABS: 50.0000 mg | ORAL_TABLET | Freq: Three times a day (TID) | ORAL | 0 refills | Status: DC | PRN

## 2017-01-10 NOTE — Telephone Encounter (Signed)
Tramadol called into pharmacy for patient I then called her & let her know it was called in. She was appreciative She will call back in 1 month when she needs the next rx  Leeanne Rio, MD

## 2017-01-19 ENCOUNTER — Other Ambulatory Visit: Payer: Self-pay | Admitting: Family Medicine

## 2017-01-19 DIAGNOSIS — Z1231 Encounter for screening mammogram for malignant neoplasm of breast: Secondary | ICD-10-CM

## 2017-01-25 ENCOUNTER — Ambulatory Visit: Payer: Medicare HMO

## 2017-01-31 ENCOUNTER — Other Ambulatory Visit: Payer: Self-pay | Admitting: Family Medicine

## 2017-02-14 ENCOUNTER — Other Ambulatory Visit: Payer: Self-pay | Admitting: Family Medicine

## 2017-02-14 ENCOUNTER — Other Ambulatory Visit: Payer: Self-pay | Admitting: *Deleted

## 2017-02-14 MED ORDER — TRAMADOL HCL 50 MG PO TABS: 50.0000 mg | ORAL_TABLET | Freq: Three times a day (TID) | ORAL | 0 refills | Status: DC | PRN

## 2017-02-14 NOTE — Telephone Encounter (Signed)
Rx called into pharmacy VM.  Patient informed.  Thelia Tanksley, Salome Spotted, CMA

## 2017-02-14 NOTE — Telephone Encounter (Signed)
Pls call in tramadol Rx Thanks [place]

## 2017-02-15 ENCOUNTER — Ambulatory Visit: Payer: Medicare HMO

## 2017-03-02 ENCOUNTER — Encounter (HOSPITAL_COMMUNITY): Payer: Self-pay | Admitting: *Deleted

## 2017-03-02 ENCOUNTER — Emergency Department (HOSPITAL_COMMUNITY): Payer: Medicare Other

## 2017-03-02 ENCOUNTER — Inpatient Hospital Stay (HOSPITAL_COMMUNITY)
Admission: EM | Admit: 2017-03-02 | Discharge: 2017-03-04 | DRG: 203 | Disposition: A | Discharge status: Home / Self Care | Payer: Medicare Other | Attending: Family Medicine | Admitting: Family Medicine

## 2017-03-02 DIAGNOSIS — R0602 Shortness of breath: Secondary | ICD-10-CM

## 2017-03-02 DIAGNOSIS — K219 Gastro-esophageal reflux disease without esophagitis: Secondary | ICD-10-CM | POA: Diagnosis not present

## 2017-03-02 DIAGNOSIS — M5416 Radiculopathy, lumbar region: Secondary | ICD-10-CM | POA: Diagnosis present

## 2017-03-02 DIAGNOSIS — Z79899 Other long term (current) drug therapy: Secondary | ICD-10-CM | POA: Diagnosis not present

## 2017-03-02 DIAGNOSIS — R402142 Coma scale, eyes open, spontaneous, at arrival to emergency department: Secondary | ICD-10-CM | POA: Diagnosis present

## 2017-03-02 DIAGNOSIS — Z7951 Long term (current) use of inhaled steroids: Secondary | ICD-10-CM

## 2017-03-02 DIAGNOSIS — R402252 Coma scale, best verbal response, oriented, at arrival to emergency department: Secondary | ICD-10-CM | POA: Diagnosis not present

## 2017-03-02 DIAGNOSIS — M5430 Sciatica, unspecified side: Secondary | ICD-10-CM | POA: Diagnosis not present

## 2017-03-02 DIAGNOSIS — Z888 Allergy status to other drugs, medicaments and biological substances status: Secondary | ICD-10-CM | POA: Diagnosis not present

## 2017-03-02 DIAGNOSIS — G4700 Insomnia, unspecified: Secondary | ICD-10-CM | POA: Diagnosis present

## 2017-03-02 DIAGNOSIS — I10 Essential (primary) hypertension: Secondary | ICD-10-CM | POA: Diagnosis not present

## 2017-03-02 DIAGNOSIS — M545 Low back pain, unspecified: Secondary | ICD-10-CM

## 2017-03-02 DIAGNOSIS — F17210 Nicotine dependence, cigarettes, uncomplicated: Secondary | ICD-10-CM | POA: Diagnosis present

## 2017-03-02 DIAGNOSIS — R402362 Coma scale, best motor response, obeys commands, at arrival to emergency department: Secondary | ICD-10-CM | POA: Diagnosis not present

## 2017-03-02 DIAGNOSIS — Z23 Encounter for immunization: Secondary | ICD-10-CM | POA: Diagnosis not present

## 2017-03-02 DIAGNOSIS — Z91018 Allergy to other foods: Secondary | ICD-10-CM | POA: Diagnosis not present

## 2017-03-02 DIAGNOSIS — M5432 Sciatica, left side: Secondary | ICD-10-CM | POA: Diagnosis present

## 2017-03-02 DIAGNOSIS — J4521 Mild intermittent asthma with (acute) exacerbation: Principal | ICD-10-CM | POA: Diagnosis present

## 2017-03-02 DIAGNOSIS — J45901 Unspecified asthma with (acute) exacerbation: Secondary | ICD-10-CM | POA: Diagnosis present

## 2017-03-02 DIAGNOSIS — E785 Hyperlipidemia, unspecified: Secondary | ICD-10-CM | POA: Diagnosis not present

## 2017-03-02 DIAGNOSIS — R079 Chest pain, unspecified: Secondary | ICD-10-CM | POA: Diagnosis not present

## 2017-03-02 LAB — LIPID PANEL
CHOL/HDL RATIO: 3.3 ratio
Cholesterol: 143 mg/dL (ref 0–200)
HDL: 44 mg/dL (ref >40)
LDL CALC: 88 mg/dL (ref 0–99)
TRIGLYCERIDES: 55 mg/dL (ref <150)
VLDL: 11 mg/dL (ref 0–40)

## 2017-03-02 LAB — BASIC METABOLIC PANEL
ANION GAP: 9 (ref 5–15)
BUN: 7 mg/dL (ref 6–20)
CALCIUM: 9.3 mg/dL (ref 8.9–10.3)
CHLORIDE: 102 mmol/L (ref 101–111)
CO2: 26 mmol/L (ref 22–32)
Creatinine, Ser: 0.81 mg/dL (ref 0.44–1.00)
GFR calc non Af Amer: >60 mL/min (ref >60)
GLUCOSE: 106 mg/dL — AB (ref 65–99)
Potassium: 3.6 mmol/L (ref 3.5–5.1)
Sodium: 137 mmol/L (ref 135–145)

## 2017-03-02 LAB — CBC
HEMATOCRIT: 38.7 % (ref 36.0–46.0)
HEMATOCRIT: 37.2 % (ref 36.0–46.0)
HEMOGLOBIN: 13 g/dL (ref 12.0–15.0)
HEMOGLOBIN: 12.4 g/dL (ref 12.0–15.0)
MCH: 30.5 pg (ref 26.0–34.0)
MCH: 29.8 pg (ref 26.0–34.0)
MCHC: 33.6 g/dL (ref 30.0–36.0)
MCHC: 33.3 g/dL (ref 30.0–36.0)
MCV: 90.8 fL (ref 78.0–100.0)
MCV: 89.4 fL (ref 78.0–100.0)
PLATELETS: 305 10*3/uL (ref 150–400)
Platelets: 283 10*3/uL (ref 150–400)
RBC: 4.26 MIL/uL (ref 3.87–5.11)
RBC: 4.16 MIL/uL (ref 3.87–5.11)
RDW: 13.4 % (ref 11.5–15.5)
RDW: 13.1 % (ref 11.5–15.5)
WBC: 5.4 10*3/uL (ref 4.0–10.5)
WBC: 6 10*3/uL (ref 4.0–10.5)

## 2017-03-02 LAB — I-STAT TROPONIN, ED: TROPONIN I, POC: 0.01 ng/mL (ref 0.00–0.08)

## 2017-03-02 LAB — CREATININE, SERUM
Creatinine, Ser: 0.97 mg/dL (ref 0.44–1.00)
GFR calc Af Amer: >60 mL/min (ref >60)
GFR calc non Af Amer: 59 mL/min — ABNORMAL LOW (ref >60)

## 2017-03-02 LAB — D-DIMER, QUANTITATIVE: D-Dimer, Quant: 0.67 ug/mL-FEU — ABNORMAL HIGH (ref 0.00–0.50)

## 2017-03-02 MED ORDER — ATORVASTATIN CALCIUM 40 MG PO TABS
40.0000 mg | ORAL_TABLET | Freq: Every day | ORAL | Status: DC
  Administered 2017-03-02 – 2017-03-04 (×3): 40 mg via ORAL
  Filled 2017-03-02 (×3): qty 1

## 2017-03-02 MED ORDER — IPRATROPIUM-ALBUTEROL 0.5-2.5 (3) MG/3ML IN SOLN
3.0000 mL | RESPIRATORY_TRACT | Status: DC
  Administered 2017-03-02: 3 mL via RESPIRATORY_TRACT
  Filled 2017-03-02: qty 3

## 2017-03-02 MED ORDER — PREDNISONE 10 MG (21) PO TBPK
ORAL_TABLET | Freq: Every day | ORAL | 0 refills | Status: DC
Start: 2017-03-02 — End: 2017-03-04

## 2017-03-02 MED ORDER — ALBUTEROL (5 MG/ML) CONTINUOUS INHALATION SOLN
5.0000 mg/h | INHALATION_SOLUTION | Freq: Once | RESPIRATORY_TRACT | Status: AC
  Administered 2017-03-02: 5 mg/h via RESPIRATORY_TRACT

## 2017-03-02 MED ORDER — IPRATROPIUM BROMIDE 0.02 % IN SOLN
0.5000 mg | Freq: Once | RESPIRATORY_TRACT | Status: AC
  Administered 2017-03-02: 0.5 mg via RESPIRATORY_TRACT
  Filled 2017-03-02: qty 2.5

## 2017-03-02 MED ORDER — GABAPENTIN 600 MG PO TABS
600.0000 mg | ORAL_TABLET | Freq: Three times a day (TID) | ORAL | Status: DC
  Administered 2017-03-02 – 2017-03-04 (×4): 600 mg via ORAL
  Filled 2017-03-02 (×4): qty 1

## 2017-03-02 MED ORDER — OXYCODONE-ACETAMINOPHEN 5-325 MG PO TABS
1.0000 | ORAL_TABLET | Freq: Once | ORAL | Status: AC
  Administered 2017-03-02: 1 via ORAL
  Filled 2017-03-02: qty 1

## 2017-03-02 MED ORDER — NICOTINE 21 MG/24HR TD PT24
21.0000 mg | MEDICATED_PATCH | Freq: Every day | TRANSDERMAL | Status: DC
  Filled 2017-03-02: qty 1

## 2017-03-02 MED ORDER — PREDNISONE 20 MG PO TABS
60.0000 mg | ORAL_TABLET | Freq: Once | ORAL | Status: AC
  Administered 2017-03-02: 60 mg via ORAL
  Filled 2017-03-02: qty 3

## 2017-03-02 MED ORDER — PREDNISONE 50 MG PO TABS
60.0000 mg | ORAL_TABLET | Freq: Every day | ORAL | Status: DC
  Administered 2017-03-03 – 2017-03-04 (×2): 60 mg via ORAL
  Filled 2017-03-02 (×2): qty 1

## 2017-03-02 MED ORDER — ALBUTEROL SULFATE (2.5 MG/3ML) 0.083% IN NEBU: 5.0000 mg | INHALATION_SOLUTION | RESPIRATORY_TRACT | Status: DC | PRN

## 2017-03-02 MED ORDER — ALBUTEROL (5 MG/ML) CONTINUOUS INHALATION SOLN
10.0000 mg/h | INHALATION_SOLUTION | Freq: Once | RESPIRATORY_TRACT | Status: AC
  Administered 2017-03-02: 10 mg/h via RESPIRATORY_TRACT
  Filled 2017-03-02: qty 20

## 2017-03-02 MED ORDER — HYDROCHLOROTHIAZIDE 12.5 MG PO CAPS
12.5000 mg | ORAL_CAPSULE | Freq: Every day | ORAL | Status: DC
  Administered 2017-03-03: 12.5 mg via ORAL
  Filled 2017-03-02 (×3): qty 1

## 2017-03-02 MED ORDER — LISINOPRIL 10 MG PO TABS
10.0000 mg | ORAL_TABLET | Freq: Every day | ORAL | Status: DC
  Administered 2017-03-03 – 2017-03-04 (×2): 10 mg via ORAL
  Filled 2017-03-02 (×2): qty 1

## 2017-03-02 MED ORDER — ENOXAPARIN SODIUM 40 MG/0.4ML ~~LOC~~ SOLN
40.0000 mg | SUBCUTANEOUS | Status: DC
  Administered 2017-03-02 – 2017-03-03 (×2): 40 mg via SUBCUTANEOUS
  Filled 2017-03-02 (×2): qty 0.4

## 2017-03-02 MED ORDER — MAGNESIUM SULFATE 2 GM/50ML IV SOLN
2.0000 g | Freq: Once | INTRAVENOUS | Status: AC
  Administered 2017-03-02: 2 g via INTRAVENOUS
  Filled 2017-03-02: qty 50

## 2017-03-02 NOTE — ED Notes (Signed)
ED Provider at bedside. 

## 2017-03-02 NOTE — ED Triage Notes (Signed)
C/o lower back pain , left pain , chest pain c/o sob  States she is using her inhaler at home without relief. Sx. Started several days ago.

## 2017-03-02 NOTE — ED Notes (Signed)
Admitting providers at bedside

## 2017-03-02 NOTE — Progress Notes (Signed)
RT attempted to obtain ABG with 1 stick to LR artery. No blood return. Pt stated she would not ever be stuck again. Pt remains 100% on RA. Will cont to monitor
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
## 2017-03-02 NOTE — ED Notes (Signed)
Dr. Cyndia Skeeters contacted this RN and made aware of patient's refusal for anyone else to attempt abg, MD requested to speak with patient via phone regarding the importance of test

## 2017-03-02 NOTE — Progress Notes (Signed)
FPTS Interim Progress Note Patient being admitted with asthma exacerbation. On exam, she had increased work of breathing with mild wheezing. She is moving air well. She speaks in full sentences. Oxygen saturation 100 on room air. Given her increased work of breathing, I recommended getting an ABG. However, I hear from her nurse that patient is declining the ABG. I called and talked to patient and the nurses phone. She continues to speak in full sentences. She says she doesn't want to be stuck again. I discussed about the risks of declining a ABG. She voices understanding the risk and refuses ABG.   Mercy Riding, MD 03/02/2017, 5:34 PM PGY-3, [facility] pager [phone]

## 2017-03-02 NOTE — ED Notes (Signed)
Dinner tray order placed

## 2017-03-02 NOTE — ED Notes (Signed)
Charge RN advised this RN that per [facility], 3E will not be ready to take report for patient until after 1900 due to staffing.

## 2017-03-02 NOTE — ED Notes (Addendum)
Respiratory therapist reported she attempted to get abg, due to patient's intolerance of lab draw, she was unable to obtain and pt refused to have abg attempted again by anyone. Family practice paged by secretary so this RN can make them aware of this.

## 2017-03-02 NOTE — ED Notes (Addendum)
Report attempted, RN states to call back in 15 min because she still getting report at [facility], pt had a bed for 1 hour 23 min assigned to the floor.

## 2017-03-02 NOTE — ED Provider Notes (Signed)
[facility] DEPT Provider Note   CSN: [id number] Arrival date & time: 03/02/17  0542     History   Chief Complaint Chief Complaint  Patient presents with  . Back Pain  . Shortness of Breath    HPI Raven [name] is a 66 y.o. female.  66 year old female with history of asthma who presents with several days of worsening cough and associated increased chronic back pain. No bowel or bladder dysfunction. Cough is been nonproductive of green-yellow sputum. No fever or chills. Is also had some diffuse chest discomfort that is worse with movement and palpation to her chest. Pain has not been associated with diaphoresis or nausea and is better with remaining still. Denies any rashes to her chest. Denies any leg pain or swelling. Has been using her inhalers with temporary relief. Denies any vomiting or diarrhea.      Past Medical History:  Diagnosis Date  . Asthma   . Chronic back pain   . Chronic leg pain    left  . Generalized headaches    ocasional, she uses naproxen.  Marland Kitchen GERD (gastroesophageal reflux disease)   . Hypertension   . Left lumbar radiculopathy   . Sciatica of left side   . Spinal stenosis of lumbar region   . Tobacco abuse     Patient Active Problem List   Diagnosis Date Noted  . Obesity 05/13/2015  . Rash and nonspecific skin eruption 02/04/2015  . 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 Vaginal cyst 12/01/2014  . Hepatic cyst 07/06/2014  . Chronic hyponatremia 07/06/2014  . Adrenal mass, left ([place]) 06/07/2014  . Encounter for chronic pain management 04/23/2014  . Headache 01/11/2014  . Carpal tunnel syndrome 10/01/2013  . Pain on swallowing 06/26/2013  . Pre-syncope 04/16/2013  . Left leg pain 01/03/2013  . GERD (gastroesophageal reflux disease) 01/03/2013  . Left hip pain 03/26/2012  . Insomnia 08/05/2011  . Constipation due to pain medication 08/05/2011  . Tooth pain 01/06/2011  . SCIATICA, LEFT 03/23/2010  . Asthma 11/23/2009  .  Hyperlipidemia 03/12/2008  . Tobacco abuse counseling 09/02/2007  . HYPERTENSION, BENIGN SYSTEMIC 07/19/2006    Past Surgical History:  Procedure Laterality Date  . CESAREAN SECTION     x2  . TRACHEOSTOMY     when she was 20    OB History    No data available       Home Medications    Prior to Admission medications   Medication Sig Start Date End Date Taking? Authorizing Provider  albuterol (PROVENTIL HFA;VENTOLIN HFA) 108 (90 BASE) MCG/ACT inhaler Inhale 1 puff into the lungs every 6 (six) hours as needed for wheezing or shortness of breath. 04/05/15   Leeanne Rio, MD  atorvastatin (LIPITOR) 40 MG tablet Take 1 tablet (40 mg total) by mouth daily. 07/17/16   Leeanne Rio, MD  cyclobenzaprine (FLEXERIL) 10 MG tablet Take 1 tablet (10 mg total) by mouth 3 (three) times daily as needed for muscle spasms. 11/21/16   Katheren Shams, DO  gabapentin (NEURONTIN) 800 MG tablet Take 1 tablet (800 mg total) by mouth 3 (three) times daily. 07/17/16   Leeanne Rio, MD  hydrocortisone cream 0.5 % Apply 1 application topically daily as needed for itching. 07/17/16   Leeanne Rio, MD  ibuprofen (ADVIL,MOTRIN) 800 MG tablet TAKE 1 TABLET EVERY DAY AS NEEDED FOR  MODERATE  PAIN 01/31/17   Leeanne Rio, MD  lisinopril-hydrochlorothiazide (PRINZIDE,ZESTORETIC) 20-25 MG tablet Take 0.5 tablets  by mouth daily. 07/17/16   Leeanne Rio, MD  Multiple Vitamin (MULTIVITAMIN WITH MINERALS) TABS tablet Take 1 tablet by mouth daily.    [provider]  polyethylene glycol powder (GLYCOLAX/MIRALAX) powder Take 17 g by mouth 2 (two) times daily as needed for mild constipation. 11/03/14   Leeanne Rio, MD  traMADol (ULTRAM) 50 MG tablet Take 1 tablet (50 mg total) by mouth every 8 (eight) hours as needed for severe pain. 02/14/17   Lind Covert, MD  zoster vaccine live, PF, (ZOSTAVAX) 10626 UNT/0.65ML injection Inject 19,400 Units into the skin  once. 09/09/15   Leeanne Rio, MD    Family History No family history on file.  Social History Social History  Substance Use Topics  . Smoking status: Current Every Day Smoker    Packs/day: 0.30    Types: Cigarettes  . Smokeless tobacco: Never Used     Comment: will quitt this year  . Alcohol use No     Allergies   Procaine hcl and Tomato   Review of Systems Review of Systems  All other systems reviewed and are negative.    Physical Exam Updated Vital Signs BP 116/74 (BP Location: Left Arm)   Temp 98.4 F (36.9 C) (Oral)   Resp 18   Ht 1.626 m (5\' 4" )   Wt 70.3 kg (155 lb)   SpO2 100%   BMI 26.61 kg/m   Physical Exam  Constitutional: She is oriented to person, place, and time. She appears well-developed and well-nourished.  Non-toxic appearance. No distress.  HENT:  Head: Normocephalic and atraumatic.  Eyes: Pupils are equal, round, and reactive to light. Conjunctivae, EOM and lids are normal.  Neck: Normal range of motion. Neck supple. No tracheal deviation present. No thyroid mass present.  Cardiovascular: Normal rate, regular rhythm and normal heart sounds.  Exam reveals no gallop.   No murmur heard. Pulmonary/Chest: Effort normal. No stridor. No respiratory distress. She has decreased breath sounds in the right lower field and the left lower field. She has wheezes in the right lower field and the left lower field. She has no rhonchi. She has no rales.    Abdominal: Soft. Normal appearance and bowel sounds are normal. She exhibits no distension. There is no tenderness. There is no rebound and no CVA tenderness.  Musculoskeletal: Normal range of motion. She exhibits no edema or tenderness.       Back:  Neurological: She is alert and oriented to person, place, and time. She has normal strength. No cranial nerve deficit or sensory deficit. GCS eye subscore is 4. GCS verbal subscore is 5. GCS motor subscore is 6.  Skin: Skin is warm and dry. No abrasion  and no rash noted.  Psychiatric: She has a normal mood and affect. Her speech is normal and behavior is normal.  Nursing note and vitals reviewed.    ED Treatments / Results  Labs (all labs ordered are listed, but only abnormal results are displayed) Labs Reviewed  BASIC METABOLIC PANEL  CBC  I-STAT TROPONIN, ED    EKG  EKG Interpretation  Date/Time:  Friday March 02 2017 06:01:44 EDT Ventricular Rate:  88 PR Interval:  158 QRS Duration: 78 QT Interval:  364 QTC Calculation: 440 R Axis:   47 Text Interpretation:  Normal sinus rhythm Normal ECG No significant change since last tracing Confirmed by Lacretia Leigh ([id number]) on 03/02/2017 6:34:48 AM       Radiology Dg Chest 2 View  Result  Date: 03/02/2017 CLINICAL DATA:  Acute onset of left-sided chest pain and shortness of breath. Initial encounter. EXAM: CHEST  2 VIEW COMPARISON:  Chest radiograph and CTA of the chest performed 07/20/2014 FINDINGS: The lungs are well-aerated. Minimal left basilar atelectasis is noted. There is no evidence of pleural effusion or pneumothorax. The heart is normal in size; the mediastinal contour is within normal limits. No acute osseous abnormalities are seen. IMPRESSION: Minimal left basilar atelectasis noted.  Lungs otherwise clear. Electronically Signed   By: Garald Balding M.D.   On: 03/02/2017 06:17    Procedures Procedures (including critical care time)  Medications Ordered in ED Medications  albuterol (PROVENTIL,VENTOLIN) solution continuous neb (not administered)  ipratropium (ATROVENT) nebulizer solution 0.5 mg (not administered)  predniSONE (DELTASONE) tablet 60 mg (not administered)     Initial Impression / Assessment and Plan / ED Course  I have reviewed the triage vital signs and the nursing notes.  Pertinent labs & imaging results that were available during my care of the patient were reviewed by me and considered in my medical decision making (see chart for details).    patient will be given continuous albuterol treatment along with prednisone orally., Patient's symptoms appear to be musculoskeletal with respect to her chest and back pain. She does have history of chronic back pain but has no red flags for spinal cord involvement. Chest x-ray here without acute findings. CBC without anemia and i-STAT 8 shows normal troponin. As he has had symptoms now for about a week I do not think that she has ACS or PE. Suspect that this is her worsening asthma/COPD as patient does have a 40-pack-year history of tobacco use. Will sign out to next provider    Final Clinical Impressions(s) / ED Diagnoses   Final diagnoses:  None    New Prescriptions New Prescriptions   No medications on file     Lacretia Leigh, MD 03/02/17 [phone]

## 2017-03-02 NOTE — ED Notes (Signed)
Respiratory called for continuous neb 

## 2017-03-02 NOTE — ED Provider Notes (Signed)
Care assumed from Dr. Zenia Resides.  At time of transfer of care, patient is being worked up and treated for likely asthma exacerbation. Patient was on continuous neb at time of transfer. Plan of care according to previous team was to reassess patient after treatment and if symptoms are improved and she is feeling better, discharge patient with prescription for steroids to follow-up with her PCP. Previous team did not feel that symptoms were due to a cardiac etiology or venous thromboembolism based on disruption of symptoms and exam. Anticipate reassessment.   On my reassessment, patient continues to have significant wheezing and difficulty breathing. Although her oxygen saturation is normal, patient is to get neck and having significant wheezing on exam. Patient will be given a second continuous nebulizer and will also be given magnesium to go with the steroids she received.  Anticipate reassessment after second nebulizer. If patient's symptoms do not improve, anticipate patient will need admission for asthma exacerbation causing her chest pain and shortness of breath.  Patient was reassessed by me again. Patient continued to have tachypnea and shortness of breath. Patient had significant wheezing despite another hour of continuous albuterol. Patient will require admission for further respiratory management. Patient remains on room air.  Patient appears to be a family medicine patient. Will call the family medicine teaching service for admission.  3:38 PM Admitting family medicine team requested d-dimer and ABG. These were ordered.      Clinical Impression: 1. Mild intermittent asthma with exacerbation   2. Shortness of breath     Disposition: Admit to family medicine    Tegeler, Gwenyth Allegra, MD 03/02/17 1610

## 2017-03-03 ENCOUNTER — Encounter (HOSPITAL_COMMUNITY): Payer: Self-pay | Admitting: *Deleted

## 2017-03-03 DIAGNOSIS — G4700 Insomnia, unspecified: Secondary | ICD-10-CM | POA: Diagnosis not present

## 2017-03-03 DIAGNOSIS — F5101 Primary insomnia: Secondary | ICD-10-CM

## 2017-03-03 DIAGNOSIS — Z888 Allergy status to other drugs, medicaments and biological substances status: Secondary | ICD-10-CM | POA: Diagnosis not present

## 2017-03-03 DIAGNOSIS — J4541 Moderate persistent asthma with (acute) exacerbation: Secondary | ICD-10-CM | POA: Diagnosis not present

## 2017-03-03 DIAGNOSIS — F17210 Nicotine dependence, cigarettes, uncomplicated: Secondary | ICD-10-CM | POA: Diagnosis not present

## 2017-03-03 DIAGNOSIS — K219 Gastro-esophageal reflux disease without esophagitis: Secondary | ICD-10-CM | POA: Diagnosis not present

## 2017-03-03 DIAGNOSIS — Z91018 Allergy to other foods: Secondary | ICD-10-CM | POA: Diagnosis not present

## 2017-03-03 DIAGNOSIS — R0602 Shortness of breath: Secondary | ICD-10-CM

## 2017-03-03 DIAGNOSIS — I10 Essential (primary) hypertension: Secondary | ICD-10-CM | POA: Diagnosis not present

## 2017-03-03 DIAGNOSIS — J4521 Mild intermittent asthma with (acute) exacerbation: Secondary | ICD-10-CM | POA: Diagnosis not present

## 2017-03-03 DIAGNOSIS — E785 Hyperlipidemia, unspecified: Secondary | ICD-10-CM | POA: Diagnosis not present

## 2017-03-03 DIAGNOSIS — Z23 Encounter for immunization: Secondary | ICD-10-CM | POA: Diagnosis not present

## 2017-03-03 LAB — CBC
HCT: 36.1 % (ref 36.0–46.0)
HEMOGLOBIN: 11.9 g/dL — AB (ref 12.0–15.0)
MCH: 29.5 pg (ref 26.0–34.0)
MCHC: 33 g/dL (ref 30.0–36.0)
MCV: 89.6 fL (ref 78.0–100.0)
PLATELETS: 286 10*3/uL (ref 150–400)
RBC: 4.03 MIL/uL (ref 3.87–5.11)
RDW: 13.5 % (ref 11.5–15.5)
WBC: 7.8 10*3/uL (ref 4.0–10.5)

## 2017-03-03 LAB — BASIC METABOLIC PANEL
Anion gap: 11 (ref 5–15)
BUN: 12 mg/dL (ref 6–20)
CO2: 23 mmol/L (ref 22–32)
Calcium: 9.3 mg/dL (ref 8.9–10.3)
Chloride: 103 mmol/L (ref 101–111)
Creatinine, Ser: 0.83 mg/dL (ref 0.44–1.00)
GFR calc Af Amer: >60 mL/min (ref >60)
GFR calc non Af Amer: >60 mL/min (ref >60)
Glucose, Bld: 101 mg/dL — ABNORMAL HIGH (ref 65–99)
Potassium: 3.2 mmol/L — ABNORMAL LOW (ref 3.5–5.1)
Sodium: 137 mmol/L (ref 135–145)

## 2017-03-03 MED ORDER — BACLOFEN 5 MG HALF TABLET
5.0000 mg | ORAL_TABLET | Freq: Three times a day (TID) | ORAL | Status: DC
  Administered 2017-03-03 – 2017-03-04 (×3): 5 mg via ORAL
  Filled 2017-03-03 (×6): qty 1

## 2017-03-03 MED ORDER — TRAZODONE HCL 50 MG PO TABS: 25.0000 mg | ORAL_TABLET | Freq: Every evening | ORAL | Status: DC | PRN

## 2017-03-03 MED ORDER — POTASSIUM CHLORIDE CRYS ER 20 MEQ PO TBCR
20.0000 meq | EXTENDED_RELEASE_TABLET | Freq: Two times a day (BID) | ORAL | Status: DC
  Administered 2017-03-03 – 2017-03-04 (×2): 20 meq via ORAL
  Filled 2017-03-03 (×2): qty 1

## 2017-03-03 MED ORDER — IPRATROPIUM-ALBUTEROL 0.5-2.5 (3) MG/3ML IN SOLN
3.0000 mL | Freq: Three times a day (TID) | RESPIRATORY_TRACT | Status: DC
  Administered 2017-03-03 – 2017-03-04 (×4): 3 mL via RESPIRATORY_TRACT
  Filled 2017-03-03 (×3): qty 3

## 2017-03-03 NOTE — Care Management Obs Status (Signed)
[facility] NOTIFICATION   Patient Details  Name: DEZYRE HOEFER MRN: [id number] Date of Birth: 1951-03-08   Medicare Observation Status Notification Given:  Yes    Bethena Roys, RN 03/03/2017, 1:56 PM
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
## 2017-03-03 NOTE — Progress Notes (Signed)
SATURATION QUALIFICATIONS: (This note is used to comply with regulatory documentation for home oxygen)  Patient Saturations on Room Air at Rest = 95%  Patient Saturations on Room Air while Ambulating = 97%   

## 2017-03-03 NOTE — Care Management CC44 (Signed)
Condition Code 44 Documentation Completed  Patient Details  Name: Raven [name] MRN: [id number] Date of Birth: [birth year]   Condition Code 44 given:  Yes Patient signature on Condition Code 44 notice:  Yes Documentation of 2 MD's agreement:  Yes Code 44 added to claim:  Yes    Bethena Roys, RN 03/03/2017, 1:56 PM
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
## 2017-03-03 NOTE — Care Management Note (Addendum)
Case Management Note  Patient Details  Name: Raven [name] MRN: [id number] Date of Birth: Jul 14, 1950  Subjective/Objective:   Pt presented for Asthma Exacerbation. PTA Independent from home with husband. No home needs identified at the time of visit. Per pt physician to set her up with pulmonary rehab.                Action/Plan: No further needs from CM at this time.   Expected Discharge Date:                  Expected Discharge Plan:  Home/Self Care  In-House Referral:  NA  Discharge planning Services  CM Consult  Post Acute Care Choice:  NA Choice offered to:  NA  DME Arranged:  N/A DME Agency:  NA  HH Arranged:  NA HH Agency:  NA  Status of Service:  Completed, signed off  If discussed at [place] of Stay Meetings, dates discussed:    Additional Comments:  Bethena Roys, RN 03/03/2017, 1:54 PM

## 2017-03-04 DIAGNOSIS — Z888 Allergy status to other drugs, medicaments and biological substances status: Secondary | ICD-10-CM | POA: Diagnosis not present

## 2017-03-04 DIAGNOSIS — Z23 Encounter for immunization: Secondary | ICD-10-CM | POA: Diagnosis not present

## 2017-03-04 DIAGNOSIS — G4700 Insomnia, unspecified: Secondary | ICD-10-CM | POA: Diagnosis not present

## 2017-03-04 DIAGNOSIS — J4521 Mild intermittent asthma with (acute) exacerbation: Secondary | ICD-10-CM | POA: Diagnosis not present

## 2017-03-04 DIAGNOSIS — F5101 Primary insomnia: Secondary | ICD-10-CM | POA: Diagnosis not present

## 2017-03-04 DIAGNOSIS — Z91018 Allergy to other foods: Secondary | ICD-10-CM | POA: Diagnosis not present

## 2017-03-04 DIAGNOSIS — J4541 Moderate persistent asthma with (acute) exacerbation: Secondary | ICD-10-CM | POA: Diagnosis not present

## 2017-03-04 DIAGNOSIS — R0602 Shortness of breath: Secondary | ICD-10-CM | POA: Diagnosis not present

## 2017-03-04 DIAGNOSIS — I10 Essential (primary) hypertension: Secondary | ICD-10-CM | POA: Diagnosis not present

## 2017-03-04 DIAGNOSIS — M545 Low back pain, unspecified: Secondary | ICD-10-CM

## 2017-03-04 DIAGNOSIS — K219 Gastro-esophageal reflux disease without esophagitis: Secondary | ICD-10-CM | POA: Diagnosis not present

## 2017-03-04 DIAGNOSIS — F17210 Nicotine dependence, cigarettes, uncomplicated: Secondary | ICD-10-CM | POA: Diagnosis not present

## 2017-03-04 DIAGNOSIS — E785 Hyperlipidemia, unspecified: Secondary | ICD-10-CM | POA: Diagnosis not present

## 2017-03-04 MED ORDER — PREDNISONE 20 MG PO TABS: 60.0000 mg | ORAL_TABLET | Freq: Every day | ORAL | 0 refills | Status: AC

## 2017-03-04 MED ORDER — INFLUENZA VAC SPLIT HIGH-DOSE 0.5 ML IM SUSY
0.5000 mL | PREFILLED_SYRINGE | INTRAMUSCULAR | Status: AC
  Administered 2017-03-04: 0.5 mL via INTRAMUSCULAR
  Filled 2017-03-04: qty 0.5

## 2017-03-04 MED ORDER — INFLUENZA VAC SPLIT HIGH-DOSE 0.5 ML IM SUSY
0.5000 mL | PREFILLED_SYRINGE | INTRAMUSCULAR | Status: DC
  Filled 2017-03-04: qty 0.5

## 2017-03-04 MED ORDER — BACLOFEN 5 MG PO TABS: 5.0000 mg | ORAL_TABLET | Freq: Three times a day (TID) | ORAL | 0 refills | Status: DC

## 2017-03-04 MED ORDER — IPRATROPIUM-ALBUTEROL 0.5-2.5 (3) MG/3ML IN SOLN: 3.0000 mL | RESPIRATORY_TRACT | Status: DC | PRN

## 2017-03-04 MED ORDER — NICOTINE 7 MG/24HR TD PT24: 7.0000 mg | MEDICATED_PATCH | Freq: Every day | TRANSDERMAL | Status: DC

## 2017-03-04 MED ORDER — PNEUMOCOCCAL VAC POLYVALENT 25 MCG/0.5ML IJ INJ
0.5000 mL | INJECTION | INTRAMUSCULAR | Status: AC
  Administered 2017-03-04: 0.5 mL via INTRAMUSCULAR
  Filled 2017-03-04: qty 0.5

## 2017-03-04 MED ORDER — IPRATROPIUM-ALBUTEROL 0.5-2.5 (3) MG/3ML IN SOLN: 3.0000 mL | Freq: Two times a day (BID) | RESPIRATORY_TRACT | Status: DC

## 2017-03-04 MED ORDER — NICOTINE 7 MG/24HR TD PT24: 7.0000 mg | MEDICATED_PATCH | Freq: Every day | TRANSDERMAL | 0 refills | Status: DC

## 2017-03-04 MED ORDER — FLUTICASONE PROPIONATE HFA 44 MCG/ACT IN AERO: 2.0000 | INHALATION_SPRAY | Freq: Two times a day (BID) | RESPIRATORY_TRACT | 1 refills | Status: DC

## 2017-03-04 NOTE — Discharge Instructions (Signed)

## 2017-03-04 NOTE — Progress Notes (Signed)
PT Cancellation and Discharge Note  Patient Details Name: CANIYAH MURLEY MRN: [id number] DOB: 1951-02-25   Cancelled Treatment:    Reason Eval/Treat Not Completed: PT screened, no needs identified, will sign off   Informed by Mickel Baas, OT, that Ms. Pronovost is independent with mobility and ADLs;  No PT needs, will sign off;   Thanks,  Roney Marion, [place]  Acute Rehabilitation Services Pager [phone] Office [place] 03/04/2017, 10:55 AM
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
## 2017-03-09 ENCOUNTER — Ambulatory Visit
Admission: RE | Admit: 2017-03-09 | Discharge: 2017-03-09 | Disposition: A | Discharge status: Home / Self Care | Payer: Medicare HMO | Source: Ambulatory Visit | Attending: Family Medicine | Admitting: Family Medicine

## 2017-03-09 ENCOUNTER — Encounter: Payer: Self-pay | Admitting: Family Medicine

## 2017-03-09 ENCOUNTER — Ambulatory Visit (INDEPENDENT_AMBULATORY_CARE_PROVIDER_SITE_OTHER): Payer: Medicare HMO | Admitting: Family Medicine

## 2017-03-09 VITALS — BP 136/68 | HR 77 | Temp 98.6°F | Ht 64.0 in | Wt 162.0 lb

## 2017-03-09 DIAGNOSIS — I10 Essential (primary) hypertension: Secondary | ICD-10-CM | POA: Diagnosis not present

## 2017-03-09 DIAGNOSIS — Z1231 Encounter for screening mammogram for malignant neoplasm of breast: Secondary | ICD-10-CM | POA: Diagnosis not present

## 2017-03-09 DIAGNOSIS — J4520 Mild intermittent asthma, uncomplicated: Secondary | ICD-10-CM | POA: Diagnosis not present

## 2017-03-09 DIAGNOSIS — G8929 Other chronic pain: Secondary | ICD-10-CM | POA: Diagnosis not present

## 2017-03-09 DIAGNOSIS — B977 Papillomavirus as the cause of diseases classified elsewhere: Secondary | ICD-10-CM | POA: Diagnosis not present

## 2017-03-09 DIAGNOSIS — E2839 Other primary ovarian failure: Secondary | ICD-10-CM

## 2017-03-09 MED ORDER — GABAPENTIN 800 MG PO TABS: 800.0000 mg | ORAL_TABLET | Freq: Three times a day (TID) | ORAL | 3 refills | Status: DC

## 2017-03-09 MED ORDER — ATORVASTATIN CALCIUM 40 MG PO TABS: 40.0000 mg | ORAL_TABLET | Freq: Every day | ORAL | 3 refills | Status: DC

## 2017-03-09 MED ORDER — ALBUTEROL SULFATE (2.5 MG/3ML) 0.083% IN NEBU
2.5000 mg | INHALATION_SOLUTION | Freq: Four times a day (QID) | RESPIRATORY_TRACT | 12 refills | Status: DC | PRN
Start: 2017-03-09 — End: 2018-04-05

## 2017-03-09 MED ORDER — TRAMADOL HCL 50 MG PO TABS: 50.0000 mg | ORAL_TABLET | Freq: Three times a day (TID) | ORAL | 2 refills | Status: DC | PRN

## 2017-03-09 MED ORDER — KETOROLAC TROMETHAMINE 30 MG/ML IJ SOLN
30.0000 mg | Freq: Once | INTRAMUSCULAR | Status: AC
  Administered 2017-03-09: 30 mg via INTRAMUSCULAR

## 2017-03-09 MED ORDER — LISINOPRIL-HYDROCHLOROTHIAZIDE 20-25 MG PO TABS: 0.5000 | ORAL_TABLET | Freq: Every day | ORAL | 3 refills | Status: DC

## 2017-03-09 MED ORDER — BACLOFEN 5 MG PO TABS: 5.0000 mg | ORAL_TABLET | Freq: Three times a day (TID) | ORAL | 2 refills | Status: AC | PRN

## 2017-03-09 NOTE — Progress Notes (Signed)
  HPI:  Raven [name] presents for hospital follow up. Patient was hospitalized from 03/02/17 to 03/04/17 with an asthma exacerbation.   Asthma - doing better now. Finished prednisone. Did not pick up flovent because it was $130. 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 Would like nebulizer machine to have at home. Using albuterol 3 times per week.  Hypertension - needs refills on lisinopril-hctz.   Smoking - still smokes 1/4 ppd. Not interested in quitting right now but states she'll try after the new year.  Back pain - still with pain in low back. No red flag symptoms. Would like toradol shot today. Taking tramadol 50mg  twice daily. Willing to go up to three times daily on this to avoid escalating to another type of opioid. Needs gabapentin refilled.  Hyperlipidemia - tolerates atorvastatin. Needs refilled.  Cervical cancer screening - still hasn't scheduled in colpo clinic to follow up on prior abnormal pap. Does not want to undergo a cervical biopsy but is willing to have cervix examined with colposcope. Would also be willing to have pap smear repeated.  ROS: See HPI.  [place]: history of hypertension, hyperlipidemia, chronic pain, high risk HPV infection, obesity, asthma, GERD, tobacco abuse  PHYSICAL EXAM: BP 136/68   Pulse 77   Temp 98.6 F (37 C) (Oral)   Ht 5\' 4"  (1.626 m)   Wt 162 lb (73.5 kg)   SpO2 99%   BMI 27.81 kg/m  Gen: no acute distress, pleasant, cooperative HEENT: normocephalic, atraumatic, moist mucous membranes  Heart: regular rate and rhythm, no murmur Lungs: clear to auscultation bilaterally, normal work of breathing, no wheezes or crackles heard extremities: No appreciable lower extremity edema bilaterally   ASSESSMENT/PLAN:  Health maintenance: - DEXA scan scheduled today - declines colonoscopy but willing to do stool testing. Given instructions on FIT testing today, patient to bring stool sample for this. - declines Tdap at this  time - declines Annual Wellness Visit   Encounter for chronic pain management Less responsive to tramadol lately. Increase to three times daily I would really like to avoid restarting any true narcotics, it was a huge win to get her down just to tramadol toradol shot today per patient request Follow up in 3 months   HYPERTENSION, BENIGN SYSTEMIC Well controlled. Continue current regimen.   High risk HPV infection Patient has not yet scheduled in colpo clinic. After shared decision making discussion, she is willing to do the following: - schedule in colpo clinic to have her cervix examined under colposcope and have pap smear done - she strongly wants to avoid a cervical biopsy if possible, but might consider it if it was highly recommended based on colposcopy findings.  Asthma Improved after hospitalization. - given neb machine today, sent in albuterol - patient to see if obtaining controller medication (flovent) will be affordable for her - encouraged tobacco cessation - follow up in 3 months   FOLLOW UP: Follow up in 3 months with me for above issues Schedule in colpo clinic  [place] J. [name], [place]

## 2017-03-09 NOTE — Patient Instructions (Signed)
Scheduled bone density test  Schedule an appointment in colposcopy clinic here at the [facility] for a pap smear and for them to look at your cervix.  Lungs sound good. Getting you set up for nebulizer machine  Bring back the stool sample  Refilled medications  Follow up with me in 3 months, sooner if needed.  Be well, Dr. [name]

## 2017-03-09 NOTE — Assessment & Plan Note (Signed)
Less responsive to tramadol lately. Increase to three times daily I would really like to avoid restarting any true narcotics, it was a huge win to get her down just to tramadol toradol shot today per patient request Follow up in 3 months

## 2017-03-09 NOTE — Assessment & Plan Note (Addendum)
Patient has not yet scheduled in colpo clinic. After shared decision making discussion, she is willing to do the following: - schedule in colpo clinic to have her cervix examined under colposcope and have pap smear done - she strongly wants to avoid a cervical biopsy if possible, but might consider it if it was highly recommended based on colposcopy findings.

## 2017-03-09 NOTE — Assessment & Plan Note (Signed)
Well-controlled.  Continue current regimen. 

## 2017-03-09 NOTE — Assessment & Plan Note (Signed)
Improved after hospitalization. - given neb machine today, sent in albuterol - patient to see if obtaining controller medication (flovent) will be affordable for her - encouraged tobacco cessation - follow up in 3 months

## 2017-03-22 ENCOUNTER — Other Ambulatory Visit: Payer: Self-pay | Admitting: Family Medicine

## 2017-03-22 MED ORDER — ALBUTEROL SULFATE HFA 108 (90 BASE) MCG/ACT IN AERS: 2.0000 | INHALATION_SPRAY | Freq: Four times a day (QID) | RESPIRATORY_TRACT | 5 refills | Status: DC | PRN

## 2017-03-22 NOTE — Telephone Encounter (Signed)
Needs refill on albeutrol inhaler. Please send to [facility]

## 2017-03-30 DIAGNOSIS — H524 Presbyopia: Secondary | ICD-10-CM | POA: Diagnosis not present

## 2017-04-03 ENCOUNTER — Ambulatory Visit
Admission: RE | Admit: 2017-04-03 | Discharge: 2017-04-03 | Disposition: A | Discharge status: Home / Self Care | Payer: Medicare HMO | Source: Ambulatory Visit | Attending: Family Medicine | Admitting: Family Medicine

## 2017-04-03 DIAGNOSIS — E2839 Other primary ovarian failure: Secondary | ICD-10-CM

## 2017-04-03 DIAGNOSIS — M85852 Other specified disorders of bone density and structure, left thigh: Secondary | ICD-10-CM | POA: Diagnosis not present

## 2017-04-03 DIAGNOSIS — Z780 Asymptomatic menopausal state: Secondary | ICD-10-CM | POA: Diagnosis not present

## 2017-04-05 ENCOUNTER — Telehealth: Payer: Self-pay | Admitting: Family Medicine

## 2017-04-05 MED ORDER — ALBUTEROL SULFATE HFA 108 (90 BASE) MCG/ACT IN AERS
2.0000 | INHALATION_SPRAY | Freq: Four times a day (QID) | RESPIRATORY_TRACT | 5 refills | Status: DC | PRN
Start: 2017-04-05 — End: 2017-11-13

## 2017-04-05 NOTE — Telephone Encounter (Signed)
Pt called and said she doesn't want her inhaler that was sent to [facility] because it cost to much. She would like her inhaler refill sent to the [facility]. Please advise

## 2017-04-05 NOTE — Telephone Encounter (Signed)
Rx sent to [facility] Leeanne Rio, MD

## 2017-04-09 ENCOUNTER — Telehealth: Payer: Self-pay | Admitting: Family Medicine

## 2017-04-09 NOTE — Telephone Encounter (Signed)
*  The [facility] billed her not insurance.

## 2017-04-09 NOTE — Telephone Encounter (Signed)
Pt came to office to return the nebulizer that was given to her on her last visit 03/09/17, she stated that the insurance billed her $137 for the nebulizer because it's not covered. [phone] If the pt need a nebulizer please send a prescription to the pharmacy. - Raven [name] Pt wants a written prescription also for her to take it to this pharmacy.  Pharmacy: [facility], [address], [place] [address].

## 2017-04-09 NOTE — Telephone Encounter (Signed)
Lauren, can you help with this DME issue? We issued a nebulizer machine at a recent visit but evidently this wasn't covered by her insurance.   Thanks! Leeanne Rio, MD

## 2017-04-10 NOTE — Telephone Encounter (Signed)
I have called Jasmine (our representative from [employer]) and lm for her to return call.   We will try to troubleshoot what went wrong. [place], Salome Spotted, CMA

## 2017-04-10 NOTE — Telephone Encounter (Addendum)
Spoke with [place].  Pt will need to call [facility] @ [phone] and choose the billing option.  They will be able to help her with that bill. LMOVM of pt asking her to return call.   I placed the nebulizer in RN clinic with pts name and DOB on it for [facility] to pickup. Martin Belling, Salome Spotted, CMA

## 2017-04-10 NOTE — Telephone Encounter (Signed)
Pt returns call, explained need to call [employer].  She will do this tomorrow.   She can get a nebulizer free of charge if she gets a script and takes it to [facility], [address] [place] [address].  Please call and let her know when it is available. Domonic [place], Salome Spotted, CMA

## 2017-04-11 ENCOUNTER — Encounter: Payer: Self-pay | Admitting: Family Medicine

## 2017-04-11 DIAGNOSIS — M8580 Other specified disorders of bone density and structure, unspecified site: Secondary | ICD-10-CM | POA: Insufficient documentation

## 2017-04-11 NOTE — Telephone Encounter (Signed)
rx written. I called patient to let her know I am placing it at the front desk for her to pick up. I also reviewed result of DEXA scan with her. Recommend calcium & vitamin D supplement daily. Patient appreciative Leeanne Rio, MD

## 2017-04-27 DIAGNOSIS — J45909 Unspecified asthma, uncomplicated: Secondary | ICD-10-CM | POA: Diagnosis not present

## 2017-05-19 ENCOUNTER — Other Ambulatory Visit: Payer: Self-pay | Admitting: Family Medicine

## 2017-05-29 NOTE — Telephone Encounter (Signed)
Please let patient know I will send in a one month supply of tramadol but I need to see her for further refills Thanks Leeanne Rio, MD

## 2017-05-29 NOTE — Telephone Encounter (Signed)
LMOVM that rx was sent in and that pt would need to schedule a appt for next refill. Deseree Kennon Holter, CMA

## 2017-07-20 ENCOUNTER — Encounter: Payer: Self-pay | Admitting: Family Medicine

## 2017-07-20 ENCOUNTER — Ambulatory Visit (INDEPENDENT_AMBULATORY_CARE_PROVIDER_SITE_OTHER): Payer: Medicare HMO | Admitting: Family Medicine

## 2017-07-20 ENCOUNTER — Other Ambulatory Visit: Payer: Self-pay

## 2017-07-20 VITALS — BP 118/72 | HR 78 | Temp 98.2°F | Ht 64.0 in | Wt 156.2 lb

## 2017-07-20 DIAGNOSIS — G8929 Other chronic pain: Secondary | ICD-10-CM

## 2017-07-20 DIAGNOSIS — J4530 Mild persistent asthma, uncomplicated: Secondary | ICD-10-CM

## 2017-07-20 DIAGNOSIS — M5432 Sciatica, left side: Secondary | ICD-10-CM | POA: Diagnosis not present

## 2017-07-20 DIAGNOSIS — E785 Hyperlipidemia, unspecified: Secondary | ICD-10-CM | POA: Diagnosis not present

## 2017-07-20 DIAGNOSIS — I10 Essential (primary) hypertension: Secondary | ICD-10-CM

## 2017-07-20 DIAGNOSIS — B977 Papillomavirus as the cause of diseases classified elsewhere: Secondary | ICD-10-CM | POA: Diagnosis not present

## 2017-07-20 MED ORDER — TETANUS-DIPHTH-ACELL PERTUSSIS 5-2.5-18.5 LF-MCG/0.5 IM SUSP: 0.5000 mL | Freq: Once | INTRAMUSCULAR | 0 refills | Status: AC

## 2017-07-20 MED ORDER — CYCLOBENZAPRINE HCL 10 MG PO TABS: 10.0000 mg | ORAL_TABLET | Freq: Three times a day (TID) | ORAL | 2 refills | Status: DC | PRN

## 2017-07-20 MED ORDER — GABAPENTIN 800 MG PO TABS: 800.0000 mg | ORAL_TABLET | Freq: Three times a day (TID) | ORAL | 3 refills | Status: DC

## 2017-07-20 MED ORDER — TRAMADOL HCL 50 MG PO TABS: 100.0000 mg | ORAL_TABLET | Freq: Three times a day (TID) | ORAL | 2 refills | Status: DC | PRN

## 2017-07-20 MED ORDER — ATORVASTATIN CALCIUM 40 MG PO TABS: 40.0000 mg | ORAL_TABLET | Freq: Every day | ORAL | 3 refills | Status: DC

## 2017-07-20 MED ORDER — LISINOPRIL-HYDROCHLOROTHIAZIDE 20-25 MG PO TABS: 0.5000 | ORAL_TABLET | Freq: Every day | ORAL | 3 refills | Status: DC

## 2017-07-20 NOTE — Patient Instructions (Addendum)
  Schedule with Dr. Valentina Lucks to get pulmonary function testing.  Sent in refill on tramadol and flexeril. Trying increased dose of tramadol.  Schedule appointment for colposcopy  Take tetanus shot prescription to your pharmacy  Sent in other refills to [facility]  Follow up with me in 3 months, sooner if needed  Be well, Dr. [name]

## 2017-07-20 NOTE — Progress Notes (Signed)
Date of Visit: 07/20/2017   HPI:  Patient presents for routine follow up and medication refill.  Chronic back pain - still has pain in low back and down left leg. Worse lately due to increased rainy weather. Taking tramadol 50mg  three times daily but thinks this is not controlling pain quite as well as she would like. Agreeable to dose increase with the goal of avoiding more potent opioids. Also needs refill of flexeril, has not had it much lately. Previously took flexeril 10mg  three times daily as needed. Also needs gabapentin refilled. Denies having fever, saddle anesthesia, lower extremity weakness, or problems with stooling or urination.   Asthma - not using flovent presently. Using albuterol 3-4 times per day. Still smokes, about 1/4 ppd. Less than 30 pack year smoking history overall. No recent PFTs.   HPV infection - has still not scheduled colposcopy. Resistant to biopsy of cervix. Upon discussion is still willing to at least have cervix examined with colposcope.  Hypertension - needs refill on lisin-HCTZ. No chest pain. Tolerating well.   ROS: See HPI.  [place]: history of hypertension, asthma, chronic pain, high risk HPV, hyperlipidemia, obesity, GERD, tobacco abuse  PHYSICAL EXAM: BP 118/72   Pulse 78   Temp 98.2 F (36.8 C) (Oral)   Ht 5\' 4"  (1.626 m)   Wt 156 lb 3.2 oz (70.9 kg)   SpO2 99%   BMI 26.81 kg/m  Gen: no acute distress, pleasant, cooperative HEENT: normocephalic, atraumatic, moist mucous membranes  Heart: regular rate and rhythm  Lungs: clear to auscultation bilaterally, normal work of breathing  Neuro: alert, grossly nonfocal, speech normal Ext: No appreciable lower extremity edema bilaterally. Full strength bilateral lower extremities.   ASSESSMENT/PLAN:  Health maintenance:  -given Tdap rx to take to her pharmacy  HYPERTENSION, BENIGN SYSTEMIC Blood pressure stable. Continue current regimen.  Asthma With frequent albuterol use lately. Would benefit  from PFTs as I suspect she has developed COPD from longstanding smoking. Will have her schedule with Dr. Valentina Lucks for PFTs.   Encounter for chronic pain management [place] Controlled Substance Database reviewed, findings are appropriate. Pain not well controlled on current dosing of tramadol. Will increase to 100mg  q8h. Refill flexeril & gabapentin. Follow up in 3 months, sooner if needed.  High risk HPV infection Stressed importance of colposcopy. Patient again states she is willing to have examination by colposcope but does not want biopsy. Agreeable to scheduling in [place] clinic. I suspect if a suspicious lesion was seen on coloposcopy she would ultimately be willing to have biopsy.   Hyperlipidemia Refilled atorvastatin. Plan to check lipids at next visit in 3 months.   FOLLOW UP: Follow up in 3 mos for above issues Schedule for PFTs with Dr. Valentina Lucks Schedule in [place] clinic  [place] J. [name], [place]

## 2017-07-23 MED ORDER — KETOROLAC TROMETHAMINE 30 MG/ML IJ SOLN
30.0000 mg | Freq: Once | INTRAMUSCULAR | Status: AC
  Administered 2017-07-20: 30 mg via INTRAMUSCULAR

## 2017-07-23 NOTE — Assessment & Plan Note (Signed)
Refilled atorvastatin. Plan to check lipids at next visit in 3 months.

## 2017-07-23 NOTE — Assessment & Plan Note (Signed)
Blood pressure stable.  Continue current regimen. 

## 2017-07-23 NOTE — Assessment & Plan Note (Signed)
[place] Controlled Substance Database reviewed, findings are appropriate. Pain not well controlled on current dosing of tramadol. Will increase to 100mg  q8h. Refill flexeril & gabapentin. Follow up in 3 months, sooner if needed.

## 2017-07-23 NOTE — Assessment & Plan Note (Signed)
With frequent albuterol use lately. Would benefit from PFTs as I suspect she has developed COPD from longstanding smoking. Will have her schedule with Dr. Valentina Lucks for PFTs.

## 2017-07-23 NOTE — Assessment & Plan Note (Signed)
Stressed importance of colposcopy. Patient again states she is willing to have examination by colposcope but does not want biopsy. Agreeable to scheduling in [place] clinic. I suspect if a suspicious lesion was seen on coloposcopy she would ultimately be willing to have biopsy.

## 2017-08-03 ENCOUNTER — Ambulatory Visit: Payer: Medicare HMO | Admitting: Family Medicine

## 2017-08-09 NOTE — Telephone Encounter (Signed)
Opened in error. Grason Brailsford, CMA 

## 2017-11-08 ENCOUNTER — Other Ambulatory Visit: Payer: Self-pay

## 2017-11-08 ENCOUNTER — Ambulatory Visit (INDEPENDENT_AMBULATORY_CARE_PROVIDER_SITE_OTHER): Payer: Medicare HMO | Admitting: Family Medicine

## 2017-11-08 DIAGNOSIS — R87810 Cervical high risk human papillomavirus (HPV) DNA test positive: Secondary | ICD-10-CM

## 2017-11-08 DIAGNOSIS — B977 Papillomavirus as the cause of diseases classified elsewhere: Secondary | ICD-10-CM | POA: Diagnosis not present

## 2017-11-08 NOTE — Progress Notes (Signed)
Here for colposcopy with abnormal pap. PAP: 11/24/2015 Adequacy Reason Satisfactory for evaluation, endocervical/transformation zone component PRESENT. Diagnosis: NEGATIVE FOR INTRAEPITHELIAL LESIONS OR MALIGNANCY. 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  Same results on pap in 2016. Pa smears dating back to 2004 are negative for cervical abnormalities. First time HPV testing was done was in 2016.  Has no pelvic symptoms. Postmenopausal. G5P5  OBJ: GEN WD WN NAD GU: externally normal with some mild vaginal atrophy. Cervix is atrophic in apperance. Small amount of thin white discharge present in vault. No adnexal masses or tenderness.  Patient given informed consent, signed copy in the chart.  Placed in lithotomy position. Cervix viewed with speculum and colposcope after application of acetic acid.   Colposcopy adequate (entire squamocolumnar junctions seen  in entirety) ?  Yes. Postmenopausal cervix.  Acetowhite lesions? none Punctation? none Mosaicism?  none Abnormal vasculature?  none Biopsies? none ECC? no Complications? no  COMMENTS:  Patient was given post procedure instructions.  Chart refiew and clinical decision making: Patient has had normal paps with a fiarly routine screening frequency. HPV testing was first done in 2016 and was positive for Hi Risk HOV on that pa as well as repeat in 2017. Given her absolutely normal appearing cervix on colposcopy and her clinical history plus age, I recommended to her in our discussion that she needs no further cervical screening. I discussed this at length. If she desires cervical screening or follow up we will provide that, but she declined. She is very happy to have  No need for biopsy today and a recommendation no more pap smears. She does have infection with hi risk HPV but given scenario as described above, she is low risk. Should she change her mind, she will let us know.Greater than 50% of our 15 minute office visit( in addition to  The time reserved  for the procedure) was spent in counseling and education regarding these issues.

## 2017-11-13 ENCOUNTER — Ambulatory Visit (INDEPENDENT_AMBULATORY_CARE_PROVIDER_SITE_OTHER): Payer: Medicare HMO | Admitting: Family Medicine

## 2017-11-13 ENCOUNTER — Other Ambulatory Visit: Payer: Self-pay

## 2017-11-13 ENCOUNTER — Encounter: Payer: Self-pay | Admitting: Family Medicine

## 2017-11-13 VITALS — BP 116/60 | HR 68 | Temp 98.0°F | Ht 64.0 in | Wt 152.0 lb

## 2017-11-13 DIAGNOSIS — M5432 Sciatica, left side: Secondary | ICD-10-CM | POA: Diagnosis not present

## 2017-11-13 DIAGNOSIS — Z716 Tobacco abuse counseling: Secondary | ICD-10-CM | POA: Diagnosis not present

## 2017-11-13 DIAGNOSIS — I10 Essential (primary) hypertension: Secondary | ICD-10-CM

## 2017-11-13 DIAGNOSIS — G8929 Other chronic pain: Secondary | ICD-10-CM | POA: Diagnosis not present

## 2017-11-13 DIAGNOSIS — Z0000 Encounter for general adult medical examination without abnormal findings: Secondary | ICD-10-CM

## 2017-11-13 DIAGNOSIS — Z1211 Encounter for screening for malignant neoplasm of colon: Secondary | ICD-10-CM

## 2017-11-13 MED ORDER — GABAPENTIN 800 MG PO TABS: 800.0000 mg | ORAL_TABLET | Freq: Three times a day (TID) | ORAL | 3 refills | Status: DC

## 2017-11-13 MED ORDER — LISINOPRIL-HYDROCHLOROTHIAZIDE 20-25 MG PO TABS: 0.5000 | ORAL_TABLET | Freq: Every day | ORAL | 3 refills | Status: DC

## 2017-11-13 MED ORDER — ATORVASTATIN CALCIUM 40 MG PO TABS: 40.0000 mg | ORAL_TABLET | Freq: Every day | ORAL | 3 refills | Status: DC

## 2017-11-13 MED ORDER — ALBUTEROL SULFATE HFA 108 (90 BASE) MCG/ACT IN AERS: 2.0000 | INHALATION_SPRAY | Freq: Four times a day (QID) | RESPIRATORY_TRACT | 5 refills | Status: DC | PRN

## 2017-11-13 MED ORDER — TETANUS-DIPHTH-ACELL PERTUSSIS 5-2.5-18.5 LF-MCG/0.5 IM SUSP: 0.5000 mL | Freq: Once | INTRAMUSCULAR | 0 refills | Status: AC

## 2017-11-13 MED ORDER — KETOROLAC TROMETHAMINE 30 MG/ML IJ SOLN
30.0000 mg | Freq: Once | INTRAMUSCULAR | Status: AC
  Administered 2017-11-13: 30 mg via INTRAMUSCULAR

## 2017-11-13 MED ORDER — TRAMADOL HCL 50 MG PO TABS: 100.0000 mg | ORAL_TABLET | Freq: Three times a day (TID) | ORAL | 2 refills | Status: DC | PRN

## 2017-11-13 NOTE — Progress Notes (Signed)
Date of Visit: 11/13/2017   HPI:  Patient presents today for a well woman exam.   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 Nori Riis in [place] [facility] with no lesions on colpo, planned for no further pap screening Exercise: working out occasionally to [employer] TV program on channel 13 Diet: some decreased appetite, does not eat breakfast. Eats lunch and dinner. Smoking: 4-5 cigarettes per day. No desire to quit. Alcohol: just occasional Drugs: no Advance directives: desires to be full code Mood: no concerns  Left ear - has pain if in cold areas. Hearing is fine. No drainage. No fevers.  Chronic pain - taking tramadol 100mg  q8h with adequate relief. Notices pain worse if she doesn't take it. No side effects from it. Able to be more functional with the medication.   ROS: See HPI  [place]:  Cancers in family: no  PHYSICAL EXAM: BP 116/60 (BP Location: Right Arm, Patient Position: Sitting, Cuff Size: Normal)   Pulse 68   Temp 98 F (36.7 C) (Oral)   Ht 5\' 4"  (1.626 m)   Wt 152 lb (68.9 kg)   SpO2 99%   BMI 26.09 kg/m  Gen: NAD, pleasant, cooperative HEENT: NCAT, PERRL, no palpable thyromegaly or anterior cervical lymphadenopathy. Tympanic membranes clear bilaterally, no abnormal findings on external auditory canals Heart: RRR, no murmurs Lungs: CTAB, NWOB Abdomen: soft, nontender to palpation Neuro: grossly nonfocal, speech normal  ASSESSMENT/PLAN:  Health maintenance:  -STD screening: declines -mammogram: UTD -lipid screening: UTD -DEXA: UTD -immunizations: past due for Tdap, states she will not get it until September, given new rx -colonoscopy: agreeable to colonoscopy. Refer to GI. No rectal bleeding. -advance directives: discussed, desires full code -handout given on health maintenance topics  Encounter for chronic pain management PMP  reviewed with appropriate findings. Refill tramadol for 6 months. Follow up in 6 months for next refill. toradol shot today by patient request.  HYPERTENSION, BENIGN SYSTEMIC Well controlled. Continue current regimen.   Tobacco abuse counseling Again encouraged patient to quit smoking. She is precontemplative.  Ear discomfort No abnormalities on exam. Monitor.   FOLLOW UP: Follow up in 6 months for chronic medical issues, sooner if needed.  [place]. [name], [place]

## 2017-11-13 NOTE — Patient Instructions (Addendum)
Refilled medications Let me know if you decide to quit smoking.  Get tetanus shot Referring for colonoscopy. Someone will call with that appointment.  Toradol shot today  Follow up in 6 months, sooner if needed.  Be well, Dr. [name]     Health Maintenance for Postmenopausal Women Menopause is a normal process in which your reproductive ability comes to an end. This process happens gradually over a span of months to years, usually between the ages of 73 and 65. Menopause is complete when you have missed 12 consecutive menstrual periods. It is important to talk with your health care provider about some of the most common conditions that affect postmenopausal women, such as heart disease, cancer, and bone loss (osteoporosis). Adopting a healthy lifestyle and getting preventive care can help to promote your health and wellness. Those actions can also lower your chances of developing some of these common conditions. What should I know about menopause? During menopause, you may experience a number of symptoms, such as:  Moderate-to-severe hot flashes.  Night sweats.  Decrease in sex drive.  Mood swings.  Headaches.  Tiredness.  Irritability.  Memory problems.  Insomnia.  Choosing to treat or not to treat menopausal changes is an individual decision that you make with your health care provider. What should I know about hormone replacement therapy and supplements? Hormone therapy products are effective for treating symptoms that are associated with menopause, such as hot flashes and night sweats. Hormone replacement carries certain risks, especially as you become older. If you are thinking about using estrogen or estrogen with progestin treatments, discuss the benefits and risks with your health care provider. What should I know about heart disease and stroke? Heart disease, heart attack, and stroke become more likely as you age. This may be due, in part, to the hormonal changes  that your body experiences during menopause. These can affect how your body processes dietary fats, triglycerides, and cholesterol. Heart attack and stroke are both medical emergencies. There are many things that you can do to help prevent heart disease and stroke:  Have your blood pressure checked at least every 1-2 years. High blood pressure causes heart disease and increases the risk of stroke.  If you are 98-74 years old, ask your health care provider if you should take aspirin to prevent a heart attack or a stroke.  Do not use any tobacco products, including cigarettes, chewing tobacco, or electronic cigarettes. If you need help quitting, ask your health care provider.  It is important to eat a healthy diet and maintain a healthy weight. ? Be sure to include plenty of vegetables, fruits, low-fat dairy products, and lean protein. ? Avoid eating foods that are high in solid fats, added sugars, or salt (sodium).  Get regular exercise. This is one of the most important things that you can do for your health. ? Try to exercise for at least 150 minutes each week. The type of exercise that you do should increase your heart rate and make you sweat. This is known as moderate-intensity exercise. ? Try to do strengthening exercises at least twice each week. Do these in addition to the moderate-intensity exercise.  Know your numbers.Ask your health care provider to check your cholesterol and your blood glucose. Continue to have your blood tested as directed by your health care provider.  What should I know about cancer screening? There are several types of cancer. Take the following steps to reduce your risk and to catch any cancer development as  early as possible. Breast Cancer  Practice breast self-awareness. ? This means understanding how your breasts normally appear and feel. ? It also means doing regular breast self-exams. Let your health care provider know about any changes, no matter how  small.  If you are 23 or older, have a clinician do a breast exam (clinical breast exam or CBE) every year. Depending on your age, family history, and medical history, it may be recommended that you also have a yearly breast X-ray (mammogram).  If you have a family history of breast cancer, talk with your health care provider about genetic screening.  If you are at high risk for breast cancer, talk with your health care provider about having an MRI and a mammogram every year.  Breast cancer (BRCA) gene test is recommended for women who have family members with BRCA-related cancers. Results of the assessment will determine the need for genetic counseling and BRCA1 and for BRCA2 testing. BRCA-related cancers include these types: ? Breast. This occurs in males or females. ? Ovarian. ? Tubal. This may also be called fallopian tube cancer. ? Cancer of the abdominal or pelvic lining (peritoneal cancer). ? Prostate. ? Pancreatic.  Cervical, Uterine, and Ovarian Cancer Your health care provider may recommend that you be screened regularly for cancer of the pelvic organs. These include your ovaries, uterus, and vagina. This screening involves a pelvic exam, which includes checking for microscopic changes to the surface of your cervix (Pap test).  For women ages 21-65, health care providers may recommend a pelvic exam and a Pap test every three years. For women ages 28-65, they may recommend the Pap test and pelvic exam, combined with testing for human papilloma virus (HPV), every five years. Some types of HPV increase your risk of cervical cancer. Testing for HPV may also be done on women of any age who have unclear Pap test results.  Other health care providers may not recommend any screening for nonpregnant women who are considered low risk for pelvic cancer and have no symptoms. Ask your health care provider if a screening pelvic exam is right for you.  If you have had past treatment for cervical  cancer or a condition that could lead to cancer, you need Pap tests and screening for cancer for at least 20 years after your treatment. If Pap tests have been discontinued for you, your risk factors (such as having a new sexual partner) need to be reassessed to determine if you should start having screenings again. Some women have medical problems that increase the chance of getting cervical cancer. In these cases, your health care provider may recommend that you have screening and Pap tests more often.  If you have a family history of uterine cancer or ovarian cancer, talk with your health care provider about genetic screening.  If you have vaginal bleeding after reaching menopause, tell your health care provider.  There are currently no reliable tests available to screen for ovarian cancer.  Lung Cancer Lung cancer screening is recommended for adults 51-36 years old who are at high risk for lung cancer because of a history of smoking. A yearly low-dose CT scan of the lungs is recommended if you:  Currently smoke.  Have a history of at least 30 pack-years of smoking and you currently smoke or have quit within the past 15 years. A pack-year is smoking an average of one pack of cigarettes per day for one year.  Yearly screening should:  Continue until it has been  15 years since you quit.  Stop if you develop a health problem that would prevent you from having lung cancer treatment.  Colorectal Cancer  This type of cancer can be detected and can often be prevented.  Routine colorectal cancer screening usually begins at age 56 and continues through age 90.  If you have risk factors for colon cancer, your health care provider may recommend that you be screened at an earlier age.  If you have a family history of colorectal cancer, talk with your health care provider about genetic screening.  Your health care provider may also recommend using home test kits to check for hidden blood in your  stool.  A small camera at the end of a tube can be used to examine your colon directly (sigmoidoscopy or colonoscopy). This is done to check for the earliest forms of colorectal cancer.  Direct examination of the colon should be repeated every 5-10 years until age 77. However, if early forms of precancerous polyps or small growths are found or if you have a family history or genetic risk for colorectal cancer, you may need to be screened more often.  Skin Cancer  Check your skin from head to toe regularly.  Monitor any moles. Be sure to tell your health care provider: ? About any new moles or changes in moles, especially if there is a change in a mole's shape or color. ? If you have a mole that is larger than the size of a pencil eraser.  If any of your family members has a history of skin cancer, especially at a young age, talk with your health care provider about genetic screening.  Always use sunscreen. Apply sunscreen liberally and repeatedly throughout the day.  Whenever you are outside, protect yourself by wearing long sleeves, pants, a wide-brimmed hat, and sunglasses.  What should I know about osteoporosis? Osteoporosis is a condition in which bone destruction happens more quickly than new bone creation. After menopause, you may be at an increased risk for osteoporosis. To help prevent osteoporosis or the bone fractures that can happen because of osteoporosis, the following is recommended:  If you are 50-68 years old, get at least 1,000 mg of calcium and at least 600 mg of vitamin D per day.  If you are older than age 46 but younger than age 43, get at least 1,200 mg of calcium and at least 600 mg of vitamin D per day.  If you are older than age 78, get at least 1,200 mg of calcium and at least 800 mg of vitamin D per day.  Smoking and excessive alcohol intake increase the risk of osteoporosis. Eat foods that are rich in calcium and vitamin D, and do weight-bearing exercises  several times each week as directed by your health care provider. What should I know about how menopause affects my mental health? Depression may occur at any age, but it is more common as you become older. Common symptoms of depression include:  Low or sad mood.  Changes in sleep patterns.  Changes in appetite or eating patterns.  Feeling an overall lack of motivation or enjoyment of activities that you previously enjoyed.  Frequent crying spells.  Talk with your health care provider if you think that you are experiencing depression. What should I know about immunizations? It is important that you get and maintain your immunizations. These include:  Tetanus, diphtheria, and pertussis (Tdap) booster vaccine.  Influenza every year before the flu season begins.  Pneumonia  vaccine.  Shingles vaccine.  Your health care provider may also recommend other immunizations. This information is not intended to replace advice given to you by your health care provider. Make sure you discuss any questions you have with your health care provider. Document Released: 06/30/2005 Document Revised: 11/26/2015 Document Reviewed: 02/09/2015 Elsevier Interactive Patient Education  2018 [employer].

## 2017-11-16 NOTE — Assessment & Plan Note (Signed)
Again encouraged patient to quit smoking. She is precontemplative.

## 2017-11-16 NOTE — Assessment & Plan Note (Signed)
Well-controlled.  Continue current regimen. 

## 2017-11-16 NOTE — Assessment & Plan Note (Addendum)
PMP reviewed with appropriate findings. Refill tramadol for 6 months. Follow up in 6 months for next refill. toradol shot today by patient request.

## 2017-11-26 DIAGNOSIS — J45909 Unspecified asthma, uncomplicated: Secondary | ICD-10-CM | POA: Diagnosis not present

## 2017-12-16 ENCOUNTER — Emergency Department (HOSPITAL_COMMUNITY)
Admission: EM | Admit: 2017-12-16 | Discharge: 2017-12-16 | Disposition: A | Discharge status: Home / Self Care | Payer: Medicare HMO | Attending: Emergency Medicine | Admitting: Emergency Medicine

## 2017-12-16 ENCOUNTER — Emergency Department (HOSPITAL_COMMUNITY): Payer: Medicare HMO

## 2017-12-16 ENCOUNTER — Encounter (HOSPITAL_COMMUNITY): Payer: Self-pay | Admitting: Emergency Medicine

## 2017-12-16 DIAGNOSIS — M5442 Lumbago with sciatica, left side: Secondary | ICD-10-CM

## 2017-12-16 DIAGNOSIS — F17210 Nicotine dependence, cigarettes, uncomplicated: Secondary | ICD-10-CM | POA: Insufficient documentation

## 2017-12-16 DIAGNOSIS — Z79899 Other long term (current) drug therapy: Secondary | ICD-10-CM | POA: Diagnosis not present

## 2017-12-16 DIAGNOSIS — R51 Headache: Secondary | ICD-10-CM | POA: Diagnosis not present

## 2017-12-16 DIAGNOSIS — J45909 Unspecified asthma, uncomplicated: Secondary | ICD-10-CM | POA: Diagnosis not present

## 2017-12-16 DIAGNOSIS — M545 Low back pain: Secondary | ICD-10-CM | POA: Diagnosis not present

## 2017-12-16 DIAGNOSIS — R202 Paresthesia of skin: Secondary | ICD-10-CM | POA: Diagnosis not present

## 2017-12-16 DIAGNOSIS — S3992XA Unspecified injury of lower back, initial encounter: Secondary | ICD-10-CM | POA: Diagnosis not present

## 2017-12-16 DIAGNOSIS — R519 Headache, unspecified: Secondary | ICD-10-CM

## 2017-12-16 DIAGNOSIS — I10 Essential (primary) hypertension: Secondary | ICD-10-CM | POA: Diagnosis not present

## 2017-12-16 MED ORDER — ACETAMINOPHEN 500 MG PO TABS
1000.0000 mg | ORAL_TABLET | Freq: Once | ORAL | Status: AC
  Administered 2017-12-16: 1000 mg via ORAL
  Filled 2017-12-16: qty 2

## 2017-12-16 MED ORDER — CYCLOBENZAPRINE HCL 10 MG PO TABS
10.0000 mg | ORAL_TABLET | Freq: Once | ORAL | Status: AC
  Administered 2017-12-16: 10 mg via ORAL
  Filled 2017-12-16: qty 1

## 2017-12-16 NOTE — ED Notes (Signed)
Patient transported to X-ray 
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
## 2017-12-16 NOTE — Discharge Instructions (Addendum)
Alternate 600 mg of ibuprofen and [phone] mg of Tylenol every 3 hours as needed for pain. Do not exceed 4000 mg of Tylenol daily.  For stomach issues.  You can also take your home tramadol as prescribed. Ice to areas of soreness for the next few days and then may move to heat. Do some gentle stretching throughout the day, especially during hot showers or baths. Take short frequent walks and avoid prolonged periods of sitting or laying. Expect to be sore for the next few day and follow up with primary care physician for recheck of ongoing symptoms but return to ER for emergent changing or worsening of symptoms such as severe headache that gets worse, altered mental status/behaving unusually, persistent vomiting, excessive drowsiness, numbness to the arms or legs, unsteady gait, or slurred speech.
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
## 2017-12-20 ENCOUNTER — Encounter: Payer: Self-pay | Admitting: Family Medicine

## 2017-12-21 ENCOUNTER — Telehealth: Payer: Self-pay | Admitting: Family Medicine

## 2017-12-21 NOTE — Telephone Encounter (Signed)
Red team,  I got a letter that [facility] office has had trouble reaching patient to schedule colonoscopy. She has not answered calls or has not returned their calls. Can you contact her and provide the phone # for the GI office [phone]) so that she can call to get this scheduled?  Thanks, Leeanne Rio, MD

## 2017-12-24 NOTE — Telephone Encounter (Signed)
LMOVM for pt to call us back. Izel Hochberg, CMA  

## 2017-12-26 ENCOUNTER — Encounter: Payer: Self-pay | Admitting: Student in an Organized Health Care Education/Training Program

## 2017-12-26 ENCOUNTER — Ambulatory Visit (INDEPENDENT_AMBULATORY_CARE_PROVIDER_SITE_OTHER): Payer: Medicare HMO | Admitting: Student in an Organized Health Care Education/Training Program

## 2017-12-26 VITALS — BP 122/65 | HR 94 | Temp 98.6°F | Ht 64.0 in | Wt 148.8 lb

## 2017-12-26 DIAGNOSIS — M79605 Pain in left leg: Secondary | ICD-10-CM | POA: Diagnosis not present

## 2017-12-26 MED ORDER — KETOROLAC TROMETHAMINE 30 MG/ML IJ SOLN
30.0000 mg | Freq: Once | INTRAMUSCULAR | Status: AC
  Administered 2017-12-26: 30 mg via INTRAMUSCULAR

## 2017-12-26 NOTE — Patient Instructions (Signed)
It was a pleasure seeing you today in our clinic. Here is the treatment plan we have discussed and agreed upon together:  We ordered an Xray. I will call with these results. If you do not hear from me within the next week, please give our office a call.  Please continue to use ice and ibuprofen for pain as discussed. Please make an appointment to follow up if symptoms do not gradually improve over the next 2 weeks.  Our clinic's number is [phone]. Please call with questions or concerns about what we discussed today.  Be well, Dr. Burr Medico
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
## 2017-12-26 NOTE — Telephone Encounter (Signed)
Pt was in office for an appt, number given to her. Deseree Kennon Holter, CMA

## 2017-12-27 ENCOUNTER — Ambulatory Visit
Admission: RE | Admit: 2017-12-27 | Discharge: 2017-12-27 | Disposition: A | Discharge status: Home / Self Care | Payer: Medicare HMO | Source: Ambulatory Visit | Attending: Family Medicine | Admitting: Family Medicine

## 2017-12-27 DIAGNOSIS — S99912A Unspecified injury of left ankle, initial encounter: Secondary | ICD-10-CM | POA: Diagnosis not present

## 2017-12-27 DIAGNOSIS — S8992XA Unspecified injury of left lower leg, initial encounter: Secondary | ICD-10-CM | POA: Diagnosis not present

## 2017-12-27 DIAGNOSIS — M79605 Pain in left leg: Secondary | ICD-10-CM

## 2017-12-28 ENCOUNTER — Telehealth: Payer: Self-pay | Admitting: *Deleted

## 2017-12-28 NOTE — Telephone Encounter (Signed)
Attempted to call, no answer and no machine.  Will create letter and route to admin to mail. Antron Seth, Salome Spotted, CMA

## 2017-12-28 NOTE — Telephone Encounter (Signed)
-----   Message from Everrett Coombe, MD sent at 12/27/2017 10:56 PM EDT ----- Please let the patient know her left leg Xrays were negative for fracture.

## 2018-01-01 ENCOUNTER — Ambulatory Visit (INDEPENDENT_AMBULATORY_CARE_PROVIDER_SITE_OTHER): Payer: Medicare HMO | Admitting: Student in an Organized Health Care Education/Training Program

## 2018-01-01 ENCOUNTER — Encounter: Payer: Self-pay | Admitting: Student in an Organized Health Care Education/Training Program

## 2018-01-01 ENCOUNTER — Other Ambulatory Visit: Payer: Self-pay

## 2018-01-01 VITALS — BP 102/64 | HR 87 | Temp 98.2°F | Ht 64.0 in | Wt 151.4 lb

## 2018-01-01 DIAGNOSIS — M62838 Other muscle spasm: Secondary | ICD-10-CM | POA: Diagnosis not present

## 2018-01-01 MED ORDER — BACLOFEN 10 MG PO TABS: 10.0000 mg | ORAL_TABLET | Freq: Three times a day (TID) | ORAL | 0 refills | Status: DC | PRN

## 2018-01-01 NOTE — Patient Instructions (Signed)
It was a pleasure seeing you today in our clinic.  Here is the treatment plan we have discussed and agreed upon together:  - STOP taking the flexeril - START taking baclofen, which is a different muscle relaxer - You can use Capsaicin over the counter to roll over the area where you are having pain  Follow up in 2 weeks if your symptoms persist or have not significantly improved.   Our clinic's number is [phone]. Please call with questions or concerns about what we discussed today.  Be well, Dr. Burr Medico

## 2018-01-01 NOTE — Assessment & Plan Note (Signed)
Trapezius muscle spasm most likely cause of back pain and headache. Flexeril is not working so we will stop this medication and start baclofen. Also recommended trying capsaicin OTC for some additional relief. Continue other pain control with tylenol and she has chronic tramadol. No red flag symptoms. Follow up in 2 weeks to ensure improvement in symptoms.
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
## 2018-01-17 ENCOUNTER — Other Ambulatory Visit: Payer: Self-pay

## 2018-01-17 ENCOUNTER — Ambulatory Visit: Payer: Medicare HMO | Admitting: Family Medicine

## 2018-01-17 ENCOUNTER — Ambulatory Visit (INDEPENDENT_AMBULATORY_CARE_PROVIDER_SITE_OTHER): Payer: Medicare HMO | Admitting: Family Medicine

## 2018-01-17 VITALS — BP 132/60 | HR 91 | Temp 98.1°F | Wt 152.0 lb

## 2018-01-17 DIAGNOSIS — R630 Anorexia: Secondary | ICD-10-CM | POA: Insufficient documentation

## 2018-01-17 DIAGNOSIS — M545 Low back pain, unspecified: Secondary | ICD-10-CM

## 2018-01-17 MED ORDER — DICLOFENAC SODIUM 1 % TD GEL: 4.0000 g | Freq: Four times a day (QID) | TRANSDERMAL | 0 refills | Status: DC

## 2018-01-17 MED ORDER — MELOXICAM 7.5 MG PO TABS
7.5000 mg | ORAL_TABLET | Freq: Every day | ORAL | 0 refills | Status: DC
Start: 2018-01-17 — End: 2018-04-05

## 2018-01-17 MED ORDER — MELOXICAM 7.5 MG PO TABS
7.5000 mg | ORAL_TABLET | Freq: Every day | ORAL | 0 refills | Status: DC
Start: 2018-01-17 — End: 2018-01-17

## 2018-01-17 NOTE — Assessment & Plan Note (Signed)
Likely slow healing MSK etiology of low back pain.  No alarm symptoms.  Has tried muscle relaxer which did not provide much benefit.  Not believe there is any role for narcotics.  Will try short course of NSAIDs.  Mobic 7.5 mg 3 weeks.  Voltaren gel as needed for pain.  Has follow-up on 9/16 with PCP.  Gave return precautions. - Mobic 7.5 mg daily for 3 weeks - Voltaren gel as needed for acute pain - Gave list of lower back

## 2018-01-17 NOTE — Patient Instructions (Addendum)
It was great meeting you today! I am sorry that you have had so much problems with your back. Given that the muscle relaxers and capsaicin ointments have not worked, I would like to try some non-steroidal anti-inflammatories. We expect for this pain to take a couple of months to completely resolve.   I will give you a low dose of an oral NSAID called mobic. Please take this for around 3 weeks until your follow up with Dr. Burr Medico. You can also apply a topical gel called voltaren gel. If this is too costly please let me know and I will call in an alternative.  Regarding your appetite. I would move your lunch time forward from 3pm to around 11am or 12. This will help you get hungrier at normal dinner times and decrease the amount of late night snacking you are doing.
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
## 2018-01-17 NOTE — Assessment & Plan Note (Signed)
Reviewed current eating habits with patient.  I believe that she is eating lunch too late and is not hungry in time her dinner.  Recommended eating lunch earlier, which will provide time for her to get hungry by dinnertime.

## 2018-02-04 ENCOUNTER — Encounter: Payer: Self-pay | Admitting: Student in an Organized Health Care Education/Training Program

## 2018-02-04 ENCOUNTER — Other Ambulatory Visit: Payer: Self-pay

## 2018-02-04 ENCOUNTER — Ambulatory Visit (INDEPENDENT_AMBULATORY_CARE_PROVIDER_SITE_OTHER): Payer: Medicare HMO | Admitting: Student in an Organized Health Care Education/Training Program

## 2018-02-04 VITALS — BP 94/60 | HR 78 | Temp 98.5°F | Ht 64.0 in | Wt 155.0 lb

## 2018-02-04 DIAGNOSIS — Z23 Encounter for immunization: Secondary | ICD-10-CM | POA: Diagnosis not present

## 2018-02-04 DIAGNOSIS — M62838 Other muscle spasm: Secondary | ICD-10-CM | POA: Diagnosis not present

## 2018-02-04 MED ORDER — KETOROLAC TROMETHAMINE 30 MG/ML IJ SOLN
30.0000 mg | Freq: Once | INTRAMUSCULAR | Status: AC
  Administered 2018-02-04: 30 mg via INTRAMUSCULAR

## 2018-02-04 NOTE — Patient Instructions (Signed)
It was a pleasure seeing you today in our clinic.   Our clinic's number is [phone]. Please call with questions or concerns about what we discussed today.  Be well, Dr. Adalie Mand   
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
## 2018-02-05 ENCOUNTER — Other Ambulatory Visit: Payer: Self-pay | Admitting: Family Medicine

## 2018-02-05 DIAGNOSIS — Z1231 Encounter for screening mammogram for malignant neoplasm of breast: Secondary | ICD-10-CM

## 2018-02-13 ENCOUNTER — Encounter: Payer: Self-pay | Admitting: Family Medicine

## 2018-02-13 ENCOUNTER — Other Ambulatory Visit: Payer: Self-pay

## 2018-02-13 ENCOUNTER — Ambulatory Visit (INDEPENDENT_AMBULATORY_CARE_PROVIDER_SITE_OTHER): Payer: Medicare HMO | Admitting: Family Medicine

## 2018-02-13 VITALS — BP 110/64 | Temp 98.3°F

## 2018-02-13 DIAGNOSIS — B029 Zoster without complications: Secondary | ICD-10-CM | POA: Diagnosis not present

## 2018-02-13 MED ORDER — VALACYCLOVIR HCL 1 G PO TABS: 1000.0000 mg | ORAL_TABLET | Freq: Three times a day (TID) | ORAL | 0 refills | Status: DC

## 2018-02-13 MED ORDER — KETOROLAC TROMETHAMINE 30 MG/ML IJ SOLN
30.0000 mg | Freq: Once | INTRAMUSCULAR | Status: AC
  Administered 2018-02-13: 30 mg via INTRAMUSCULAR

## 2018-02-13 NOTE — Assessment & Plan Note (Signed)
Rash and symptoms consistent with Shingles.  Valacylovir 1000mg  TID for seven days as her presentation is under 72hrs. Also given Capcaisin cream OTC to help with pain and instructed via telephone not to use it on open wounds. Patient given Toradol shot in office today and instructed to take Tylenol as needed.

## 2018-02-13 NOTE — Progress Notes (Signed)
     Subjective: Chief Complaint  Patient presents with  . Rash     HPI: Raven [name] is a 67 y.o. presenting to clinic today to discuss the following:  Rash on Right Shoulder/Neck Patient has a burning, painful rash on her right shoulder that is extending to her neck. No fever and never had a rash like this before. She states it started Monday and she tried a heating pad which made it worse. Nothing makes it better and her skin is very sensitive to touch. She does endorse having chicken pox as a child. Rash is only on the right side. Denies any facial or eye involvement.  Health Maintenance: none today     ROS noted in HPI.   Past Medical, Surgical, Social, and Family History Reviewed & Updated per EMR.   Pertinent Historical Findings include:   Social History   Tobacco Use  Smoking Status Current Every Day Smoker  . Packs/day: 0.25  . Types: Cigarettes  Smokeless Tobacco Never Used   Objective: BP 110/64 (BP Location: Left Arm)   Temp 98.3 F (36.8 C) (Oral)  Vitals and nursing notes reviewed  Physical Exam Gen: Alert and Oriented x 3, NAD HEENT: Normocephalic, atraumatic MSK: Moves all four extremities Ext: no clubbing, cyanosis, or edema Neuro: No gross deficits Skin: warm, dry, intact     No results found for this or any previous visit (from the past 72 hour(s)).  Assessment/Plan:  Herpes zoster without complication Rash and symptoms consistent with Shingles.  Valacylovir 1000mg  TID for seven days as her presentation is under 72hrs. Also given Capcaisin cream OTC to help with pain and instructed via telephone not to use it on open wounds. Patient given Toradol shot in office today and instructed to take Tylenol as needed.   PATIENT EDUCATION PROVIDED: See AVS    Diagnosis and plan along with any newly prescribed medication(s) were discussed in detail with this patient today. The patient verbalized understanding and agreed with the plan. Patient  advised if symptoms worsen return to clinic or ER.   Health Maintainance:   No orders of the defined types were placed in this encounter.   Meds ordered this encounter  Medications  . valACYclovir (VALTREX) 1000 MG tablet    Sig: Take 1 tablet (1,000 mg total) by mouth 3 (three) times daily.    Dispense:  21 tablet    Refill:  0  . ketorolac (TORADOL) 30 MG/ML injection 30 mg     Harolyn Rutherford, DO 02/13/2018, 11:07 AM PGY-2 [place]

## 2018-02-13 NOTE — Patient Instructions (Addendum)
It was great to meet you today! Thank you for letting me participate in your care!  Today, we discussed your rash and it is most likely Shingles, caused by a reactivated virus that causes chickenpox in children.  I have given you a shot for pain today and you can also use Capzacin cream three to four times per day for pain control. Below is a picture.     Be well, Harolyn Rutherford, DO PGY-2, [facility]   Shingles Shingles is an infection that causes a painful skin rash and fluid-filled blisters. Shingles is caused by the same virus that causes chickenpox. Shingles only develops in people who:  Have had chickenpox.  Have gotten the chickenpox vaccine. (This is rare.)  The first symptoms of shingles may be itching, tingling, or pain in an area on your skin. A rash will follow in a few days or weeks. The rash is usually on one side of the body in a bandlike or beltlike pattern. Over time, the rash turns into fluid-filled blisters that break open, scab over, and dry up. Medicines may:  Help you manage pain.  Help you recover more quickly.  Help to prevent long-term problems.  Follow these instructions at home: Medicines  Take medicines only as told by your doctor.  Apply an anti-itch or numbing cream to the affected area as told by your doctor. Blister and Rash Care  Take a cool bath or put cool compresses on the area of the rash or blisters as told by your doctor. This may help with pain and itching.  Keep your rash covered with a loose bandage (dressing). Wear loose-fitting clothing.  Keep your rash and blisters clean with mild soap and cool water or as told by your doctor.  Check your rash every day for signs of infection. These include redness, swelling, and pain that lasts or gets worse.  Do not pick your blisters.  Do not scratch your rash. General instructions  Rest as told by your doctor.  Keep all follow-up visits as told by your doctor. This is  important.  Until your blisters scab over, your infection can cause chickenpox in people who have never had it or been vaccinated against it. To prevent this from happening, avoid touching other people or being around other people, especially: ? Babies. ? Pregnant women. ? Children who have eczema. ? Elderly people who have transplants. ? People who have chronic illnesses, such as leukemia or AIDS. Contact a doctor if:  Your pain does not get better with medicine.  Your pain does not get better after the rash heals.  Your rash looks infected. Signs of infection include: ? Redness. ? Swelling. ? Pain that lasts or gets worse. Get help right away if:  The rash is on your face or nose.  You have pain in your face, pain around your eye area, or loss of feeling on one side of your face.  You have ear pain or you have ringing in your ear.  You have loss of taste.  Your condition gets worse. This information is not intended to replace advice given to you by your health care provider. Make sure you discuss any questions you have with your health care provider. Document Released: 10/25/2007 Document Revised: 01/02/2016 Document Reviewed: 02/17/2014 Elsevier Interactive Patient Education  [employer].

## 2018-02-26 ENCOUNTER — Ambulatory Visit: Payer: Medicare HMO | Admitting: Family Medicine

## 2018-03-11 ENCOUNTER — Other Ambulatory Visit: Payer: Self-pay | Admitting: Family Medicine

## 2018-03-11 ENCOUNTER — Ambulatory Visit: Payer: Medicare HMO

## 2018-03-11 ENCOUNTER — Ambulatory Visit: Payer: Medicare HMO | Admitting: Family Medicine

## 2018-03-14 ENCOUNTER — Other Ambulatory Visit: Payer: Self-pay

## 2018-03-14 ENCOUNTER — Encounter: Payer: Self-pay | Admitting: Family Medicine

## 2018-03-14 ENCOUNTER — Ambulatory Visit (INDEPENDENT_AMBULATORY_CARE_PROVIDER_SITE_OTHER): Payer: Medicare HMO | Admitting: Family Medicine

## 2018-03-14 VITALS — BP 102/61 | HR 80 | Temp 98.1°F | Wt 157.0 lb

## 2018-03-14 DIAGNOSIS — B029 Zoster without complications: Secondary | ICD-10-CM

## 2018-03-14 DIAGNOSIS — Z1211 Encounter for screening for malignant neoplasm of colon: Secondary | ICD-10-CM | POA: Diagnosis not present

## 2018-03-14 DIAGNOSIS — Z0000 Encounter for general adult medical examination without abnormal findings: Secondary | ICD-10-CM | POA: Diagnosis not present

## 2018-03-14 DIAGNOSIS — Z23 Encounter for immunization: Secondary | ICD-10-CM | POA: Diagnosis not present

## 2018-03-14 MED ORDER — TETANUS-DIPHTH-ACELL PERTUSSIS 5-2-15.5 LF-MCG/0.5 IM SUSP: 0.5000 mL | Freq: Once | INTRAMUSCULAR | 0 refills | Status: AC

## 2018-03-14 NOTE — Assessment & Plan Note (Signed)
Now resolved.  Doing well. °

## 2018-03-14 NOTE — Progress Notes (Signed)
    Subjective:  Raven [name] is a 67 y.o. female who presents to the [facility] today for shingles follow up  HPI:  Patient states that she had some shingles on her right shoulder and neck area last month.  She took Valtrex as well as capsaicin cream which both helped.  She says that the rash is almost entirely resolved and she just has some residual scarring.  She denies any pain, itching, new rash, skin sensitivity.  ROS: Per HPI   Objective:  Physical Exam: BP 102/61   Pulse 80   Temp 98.1 F (36.7 C) (Oral)   Wt 157 lb (71.2 kg)   SpO2 92%   BMI 26.95 kg/m   Gen: NAD, resting comfortably Pulm: NWOB on room air  Skin: warm, dry. Scattered pigmented scarring in distribution of previous shingles outbreak along R neck and shoulder. No new vesicles or rashes Neuro: grossly normal, moves all extremities Psych: Normal affect and thought content   Assessment/Plan:  Herpes zoster without complication Now resolved. Doing well.  Healthcare maintenance Tetanus booster rx given to patient today. Referral to GI for screening colonoscopy.   Bufford Lope, DO PGY-3, [facility] 03/14/2018 8:35 AM

## 2018-03-14 NOTE — Patient Instructions (Addendum)
Glad the shingles is resolving.  Get your tetanus booster at a pharmacy of your choosing.  Referral to GI, sent. Someone should call you with scheduling. If you do not hear back after 2 weeks then give our office a call to the check on the status of the referral.  It was good to see you today!  Feel free to call with any questions or concerns at any time, at [phone].   Take care,  Dr. Bufford Lope, [place]

## 2018-03-14 NOTE — Assessment & Plan Note (Signed)
Tetanus booster rx given to patient today. Referral to GI for screening colonoscopy.

## 2018-04-05 ENCOUNTER — Ambulatory Visit (INDEPENDENT_AMBULATORY_CARE_PROVIDER_SITE_OTHER): Payer: Medicare HMO | Admitting: Family Medicine

## 2018-04-05 ENCOUNTER — Other Ambulatory Visit: Payer: Self-pay

## 2018-04-05 ENCOUNTER — Encounter: Payer: Self-pay | Admitting: Family Medicine

## 2018-04-05 VITALS — BP 124/60 | HR 80 | Temp 98.1°F | Ht 64.0 in | Wt 159.4 lb

## 2018-04-05 DIAGNOSIS — Z716 Tobacco abuse counseling: Secondary | ICD-10-CM

## 2018-04-05 DIAGNOSIS — G8929 Other chronic pain: Secondary | ICD-10-CM | POA: Diagnosis not present

## 2018-04-05 DIAGNOSIS — E785 Hyperlipidemia, unspecified: Secondary | ICD-10-CM | POA: Diagnosis not present

## 2018-04-05 DIAGNOSIS — I10 Essential (primary) hypertension: Secondary | ICD-10-CM | POA: Diagnosis not present

## 2018-04-05 DIAGNOSIS — J4520 Mild intermittent asthma, uncomplicated: Secondary | ICD-10-CM

## 2018-04-05 MED ORDER — ALBUTEROL SULFATE HFA 108 (90 BASE) MCG/ACT IN AERS: 2.0000 | INHALATION_SPRAY | Freq: Four times a day (QID) | RESPIRATORY_TRACT | 5 refills | Status: DC | PRN

## 2018-04-05 MED ORDER — TETANUS-DIPHTH-ACELL PERTUSSIS 5-2.5-18.5 LF-MCG/0.5 IM SUSP: 0.5000 mL | Freq: Once | INTRAMUSCULAR | 0 refills | Status: AC

## 2018-04-05 MED ORDER — ALBUTEROL SULFATE (2.5 MG/3ML) 0.083% IN NEBU: 2.5000 mg | INHALATION_SOLUTION | Freq: Four times a day (QID) | RESPIRATORY_TRACT | 12 refills | Status: DC | PRN

## 2018-04-05 NOTE — Assessment & Plan Note (Signed)
Well-controlled.  Continue current regimen. 

## 2018-04-05 NOTE — Assessment & Plan Note (Signed)
Encouraged cessation, gave 1800 quit now #, also info on scheduling with Dr. Valentina Lucks.

## 2018-04-05 NOTE — Assessment & Plan Note (Signed)
Refill of albuterol given, using only once/week. Also encouraged tobacco cessation.

## 2018-04-05 NOTE — Assessment & Plan Note (Signed)
Check lipids today & titrate statin as needed

## 2018-04-05 NOTE — Patient Instructions (Signed)
It was great to see you again today!  Refilling albuterol Referring to back doctor and physical therapy  Checking labs today Take tetanus shot to pharmacy & get it Call [facility] to schedule your colonoscopy [phone]  Dr. Valentina Lucks is our pharmacist and can help you with quitting smoking, you can schedule a visit with him. Call 1800-QUIT-NOW for help with stopping smoking.  Follow up in 2 months  Be well, Dr. [name]

## 2018-04-05 NOTE — Assessment & Plan Note (Signed)
[place] PMP reviewed with appropriate findings. Patient recently got 3 month rx from Dr. McDiarmid while I was out and will not need another refill for 2 months. She will follow up with me in 2 months. I am referring her back over to neurosurgery for steroid injections at her request, also to physical therapy.
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
## 2018-04-06 LAB — CMP14+EGFR
ALBUMIN: 4.3 g/dL (ref 3.6–4.8)
ALT: 28 IU/L (ref 0–32)
AST: 34 IU/L (ref 0–40)
Albumin/Globulin Ratio: 1.3 (ref 1.2–2.2)
Alkaline Phosphatase: 140 IU/L — ABNORMAL HIGH (ref 39–117)
BILIRUBIN TOTAL: 0.2 mg/dL (ref 0.0–1.2)
BUN / CREAT RATIO: 17 (ref 12–28)
BUN: 15 mg/dL (ref 8–27)
CO2: 25 mmol/L (ref 20–29)
CREATININE: 0.88 mg/dL (ref 0.57–1.00)
Calcium: 9.5 mg/dL (ref 8.7–10.3)
Chloride: 100 mmol/L (ref 96–106)
GFR calc non Af Amer: 68 mL/min/{1.73_m2} (ref >59)
GFR, EST AFRICAN AMERICAN: 78 mL/min/{1.73_m2} (ref >59)
GLOBULIN, TOTAL: 3.3 g/dL (ref 1.5–4.5)
Glucose: 75 mg/dL (ref 65–99)
Potassium: 4.2 mmol/L (ref 3.5–5.2)
SODIUM: 141 mmol/L (ref 134–144)
TOTAL PROTEIN: 7.6 g/dL (ref 6.0–8.5)

## 2018-04-06 LAB — LIPID PANEL
Chol/HDL Ratio: 3.2 ratio (ref 0.0–4.4)
Cholesterol, Total: 142 mg/dL (ref 100–199)
HDL: 44 mg/dL (ref >39)
LDL CALC: 76 mg/dL (ref 0–99)
Triglycerides: 109 mg/dL (ref 0–149)
VLDL CHOLESTEROL CAL: 22 mg/dL (ref 5–40)

## 2018-04-10 ENCOUNTER — Encounter: Payer: Self-pay | Admitting: Family Medicine

## 2018-04-10 ENCOUNTER — Telehealth: Payer: Self-pay

## 2018-04-10 MED ORDER — ALBUTEROL SULFATE (2.5 MG/3ML) 0.083% IN NEBU: 2.5000 mg | INHALATION_SOLUTION | Freq: Four times a day (QID) | RESPIRATORY_TRACT | 12 refills | Status: DC | PRN

## 2018-04-10 NOTE — Telephone Encounter (Signed)
Fax from pharmacy requesting to dispense quantity of 90 mL on albuterol neb due to how it is packaged.  Danley Danker, RN [facility] RN)

## 2018-04-10 NOTE — Telephone Encounter (Signed)
That is fine, please authorize this Leeanne Rio, MD

## 2018-04-16 ENCOUNTER — Ambulatory Visit
Admission: RE | Admit: 2018-04-16 | Discharge: 2018-04-16 | Disposition: A | Discharge status: Home / Self Care | Payer: Medicare HMO | Source: Ambulatory Visit | Attending: Family Medicine | Admitting: Family Medicine

## 2018-04-16 DIAGNOSIS — Z1231 Encounter for screening mammogram for malignant neoplasm of breast: Secondary | ICD-10-CM

## 2018-04-25 ENCOUNTER — Encounter: Payer: Self-pay | Admitting: Family Medicine

## 2018-04-28 ENCOUNTER — Emergency Department (HOSPITAL_COMMUNITY)
Admission: EM | Admit: 2018-04-28 | Discharge: 2018-04-28 | Disposition: A | Discharge status: Home / Self Care | Payer: Medicare HMO | Attending: Emergency Medicine | Admitting: Emergency Medicine

## 2018-04-28 ENCOUNTER — Emergency Department (HOSPITAL_COMMUNITY): Payer: Medicare HMO

## 2018-04-28 ENCOUNTER — Other Ambulatory Visit: Payer: Self-pay

## 2018-04-28 ENCOUNTER — Encounter (HOSPITAL_COMMUNITY): Payer: Self-pay

## 2018-04-28 DIAGNOSIS — Z79899 Other long term (current) drug therapy: Secondary | ICD-10-CM | POA: Diagnosis not present

## 2018-04-28 DIAGNOSIS — F17210 Nicotine dependence, cigarettes, uncomplicated: Secondary | ICD-10-CM | POA: Diagnosis not present

## 2018-04-28 DIAGNOSIS — I10 Essential (primary) hypertension: Secondary | ICD-10-CM | POA: Diagnosis not present

## 2018-04-28 DIAGNOSIS — R1031 Right lower quadrant pain: Secondary | ICD-10-CM | POA: Diagnosis not present

## 2018-04-28 DIAGNOSIS — M549 Dorsalgia, unspecified: Secondary | ICD-10-CM | POA: Diagnosis not present

## 2018-04-28 DIAGNOSIS — K5730 Diverticulosis of large intestine without perforation or abscess without bleeding: Secondary | ICD-10-CM | POA: Diagnosis not present

## 2018-04-28 DIAGNOSIS — R112 Nausea with vomiting, unspecified: Secondary | ICD-10-CM

## 2018-04-28 DIAGNOSIS — R1032 Left lower quadrant pain: Secondary | ICD-10-CM | POA: Insufficient documentation

## 2018-04-28 DIAGNOSIS — K769 Liver disease, unspecified: Secondary | ICD-10-CM | POA: Diagnosis not present

## 2018-04-28 DIAGNOSIS — R197 Diarrhea, unspecified: Secondary | ICD-10-CM | POA: Insufficient documentation

## 2018-04-28 DIAGNOSIS — R0602 Shortness of breath: Secondary | ICD-10-CM | POA: Diagnosis not present

## 2018-04-28 LAB — COMPREHENSIVE METABOLIC PANEL
ALT: 30 U/L (ref 0–44)
AST: 38 U/L (ref 15–41)
Albumin: 4.7 g/dL (ref 3.5–5.0)
Alkaline Phosphatase: 102 U/L (ref 38–126)
Anion gap: 14 (ref 5–15)
BUN: 15 mg/dL (ref 8–23)
CO2: 22 mmol/L (ref 22–32)
Calcium: 10 mg/dL (ref 8.9–10.3)
Chloride: 98 mmol/L (ref 98–111)
Creatinine, Ser: 0.99 mg/dL (ref 0.44–1.00)
GFR calc Af Amer: >60 mL/min (ref >60)
GFR calc non Af Amer: 59 mL/min — ABNORMAL LOW (ref >60)
Glucose, Bld: 121 mg/dL — ABNORMAL HIGH (ref 70–99)
Potassium: 3.5 mmol/L (ref 3.5–5.1)
Sodium: 134 mmol/L — ABNORMAL LOW (ref 135–145)
Total Bilirubin: 0.8 mg/dL (ref 0.3–1.2)
Total Protein: 9.3 g/dL — ABNORMAL HIGH (ref 6.5–8.1)

## 2018-04-28 LAB — CBC WITH DIFFERENTIAL/PLATELET
Abs Immature Granulocytes: 0.04 10*3/uL (ref 0.00–0.07)
Basophils Absolute: 0 10*3/uL (ref 0.0–0.1)
Basophils Relative: 0 %
Eosinophils Absolute: 0.1 10*3/uL (ref 0.0–0.5)
Eosinophils Relative: 1 %
HEMATOCRIT: 45 % (ref 36.0–46.0)
Hemoglobin: 14.9 g/dL (ref 12.0–15.0)
IMMATURE GRANULOCYTES: 1 %
Lymphocytes Relative: 21 %
Lymphs Abs: 1.6 10*3/uL (ref 0.7–4.0)
MCH: 30.8 pg (ref 26.0–34.0)
MCHC: 33.1 g/dL (ref 30.0–36.0)
MCV: 93 fL (ref 80.0–100.0)
Monocytes Absolute: 0.8 10*3/uL (ref 0.1–1.0)
Monocytes Relative: 10 %
Neutro Abs: 5 10*3/uL (ref 1.7–7.7)
Neutrophils Relative %: 67 %
Platelets: 302 10*3/uL (ref 150–400)
RBC: 4.84 MIL/uL (ref 3.87–5.11)
RDW: 12.8 % (ref 11.5–15.5)
WBC: 7.5 10*3/uL (ref 4.0–10.5)
nRBC: 0 % (ref 0.0–0.2)

## 2018-04-28 LAB — LIPASE, BLOOD: Lipase: 45 U/L (ref 11–51)

## 2018-04-28 MED ORDER — IOHEXOL 300 MG/ML  SOLN
100.0000 mL | Freq: Once | INTRAMUSCULAR | Status: AC | PRN
  Administered 2018-04-28: 100 mL via INTRAVENOUS

## 2018-04-28 MED ORDER — SODIUM CHLORIDE 0.9 % IV BOLUS
1000.0000 mL | Freq: Once | INTRAVENOUS | Status: AC
  Administered 2018-04-28: 1000 mL via INTRAVENOUS

## 2018-04-28 MED ORDER — LOPERAMIDE HCL 2 MG PO CAPS: 2.0000 mg | ORAL_CAPSULE | Freq: Four times a day (QID) | ORAL | 0 refills | Status: DC | PRN

## 2018-04-28 MED ORDER — ONDANSETRON HCL 4 MG/2ML IJ SOLN
4.0000 mg | Freq: Once | INTRAMUSCULAR | Status: AC
  Administered 2018-04-28: 4 mg via INTRAVENOUS
  Filled 2018-04-28: qty 2

## 2018-04-28 MED ORDER — ONDANSETRON HCL 4 MG PO TABS: 4.0000 mg | ORAL_TABLET | Freq: Three times a day (TID) | ORAL | 0 refills | Status: DC | PRN

## 2018-04-28 MED ORDER — IOHEXOL 300 MG/ML  SOLN: 100.0000 mL | Freq: Once | INTRAMUSCULAR | Status: DC

## 2018-04-28 MED ORDER — MORPHINE SULFATE (PF) 4 MG/ML IV SOLN
4.0000 mg | Freq: Once | INTRAVENOUS | Status: AC
  Administered 2018-04-28: 4 mg via INTRAVENOUS
  Filled 2018-04-28: qty 1

## 2018-04-28 NOTE — ED Provider Notes (Signed)
[facility] EMERGENCY DEPARTMENT Provider Note   CSN: [id number] Arrival date & time: 04/28/18  0701     History   Chief Complaint Chief Complaint  Patient presents with  . Abdominal Pain  . Emesis  . Back Pain  . Diarrhea    HPI Raven [name] is a 67 y.o. female.  She is a history of chronic back pain and hypertension.  She is presenting with 3 days of nausea vomiting diarrhea with low abdominal pain.  She said she is had diarrhea multiple times a day and multiple episodes of vomiting a day.  No blood from either end.  She is felt hot and cold but has not checked her temperature.  She has 8 out of 10 crampy low abdominal pain.  She is felt short of breath.  No sick contacts no recent travel.  She is tried drinking some ginger ale with minimal improvement.  All of the vomiting is worsened her chronic back pain.  The history is provided by the patient.  Abdominal Pain   This is a new problem. Episode onset: 3 days. The problem occurs constantly. The problem has not changed since onset.The pain is associated with an unknown factor. The pain is located in the LLQ, RLQ and suprapubic region. The quality of the pain is cramping. The pain is at a severity of 8/10. Associated symptoms include diarrhea, nausea and vomiting. Pertinent negatives include fever, hematochezia, melena, dysuria, frequency and headaches. Nothing aggravates the symptoms. Nothing relieves the symptoms.  Emesis   Associated symptoms include abdominal pain and diarrhea. Pertinent negatives include no fever and no headaches.  Back Pain   Associated symptoms include abdominal pain. Pertinent negatives include no chest pain, no fever, no headaches and no dysuria.  Diarrhea   Associated symptoms include abdominal pain and vomiting. Pertinent negatives include no headaches.    Past Medical History:  Diagnosis Date  . Asthma   . Chronic back pain   . Chronic leg pain    left  . Generalized headaches      ocasional, she uses naproxen.  Marland Kitchen GERD (gastroesophageal reflux disease)   . Hypertension   . Left lumbar radiculopathy   . Sciatica of left side   . Spinal stenosis of lumbar region   . Tobacco abuse     Patient Active Problem List   Diagnosis Date Noted  . Herpes zoster without complication 01/60/1093  . Poor appetite 01/17/2018  . Osteopenia 04/11/2017  . Acute right-sided low back pain without sciatica   . Shortness of breath   . Obesity 05/13/2015  . 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 Vaginal cyst 12/01/2014  . Hepatic cyst 07/06/2014  . Chronic hyponatremia 07/06/2014  . Adrenal mass, left ([place]) 06/07/2014  . Encounter for chronic pain management 04/23/2014  . Headache 01/11/2014  . Carpal tunnel syndrome 10/01/2013  . Pain on swallowing 06/26/2013  . Pre-syncope 04/16/2013  . Left leg pain 01/03/2013  . GERD (gastroesophageal reflux disease) 01/03/2013  . Left hip pain 03/26/2012  . Insomnia 08/05/2011  . Constipation due to pain medication 08/05/2011  . Healthcare maintenance 04/08/2011  . Tooth pain 01/06/2011  . SCIATICA, LEFT 03/23/2010  . Asthma 11/23/2009  . Hyperlipidemia 03/12/2008  . Tobacco abuse counseling 09/02/2007  . HYPERTENSION, BENIGN SYSTEMIC 07/19/2006    Past Surgical History:  Procedure Laterality Date  . CESAREAN SECTION     x2  . TRACHEOSTOMY     when she was 20  OB History   None      Home Medications    Prior to Admission medications   Medication Sig Start Date End Date Taking? Authorizing Provider  albuterol (PROVENTIL HFA;VENTOLIN HFA) 108 (90 Base) MCG/ACT inhaler Inhale 2 puffs into the lungs every 6 (six) hours as needed for wheezing or shortness of breath. 04/05/18   Leeanne Rio, MD  albuterol (PROVENTIL) (2.5 MG/3ML) 0.083% nebulizer solution Take 3 mLs (2.5 mg total) by nebulization every 6 (six) hours as needed for wheezing or shortness of breath. 04/10/18   Leeanne Rio, MD  atorvastatin  (LIPITOR) 40 MG tablet Take 1 tablet (40 mg total) by mouth daily. 11/13/17   Leeanne Rio, MD  gabapentin (NEURONTIN) 800 MG tablet Take 1 tablet (800 mg total) by mouth 3 (three) times daily. 11/13/17   Leeanne Rio, MD  ibuprofen (ADVIL,MOTRIN) 800 MG tablet TAKE 1 TABLET EVERY DAY AS NEEDED FOR  MODERATE  PAIN 01/31/17   Leeanne Rio, MD  lisinopril-hydrochlorothiazide (PRINZIDE,ZESTORETIC) 20-25 MG tablet Take 0.5 tablets by mouth daily. 11/13/17   Leeanne Rio, MD  Multiple Vitamin (MULTIVITAMIN WITH MINERALS) TABS tablet Take 1 tablet by mouth daily.    [provider]  polyethylene glycol powder (GLYCOLAX/MIRALAX) powder Take 17 g by mouth 2 (two) times daily as needed for mild constipation. 11/03/14   Leeanne Rio, MD  traMADol (ULTRAM) 50 MG tablet TAKE 2 TABLETS BY MOUTH EVERY 8 HOURS AS NEEDED 03/11/18   McDiarmid, Blane Ohara, MD    Family History No family history on file.  Social History Social History   Tobacco Use  . Smoking status: Current Every Day Smoker    Packs/day: 0.25    Types: Cigarettes  . Smokeless tobacco: Never Used  Substance Use Topics  . Alcohol use: No  . Drug use: No     Allergies   Procaine hcl and Tomato   Review of Systems Review of Systems  Constitutional: Negative for fever.  HENT: Negative for sore throat.   Eyes: Negative for visual disturbance.  Respiratory: Positive for shortness of breath.   Cardiovascular: Negative for chest pain.  Gastrointestinal: Positive for abdominal pain, diarrhea, nausea and vomiting. Negative for blood in stool, hematochezia and melena.  Genitourinary: Negative for dysuria and frequency.  Musculoskeletal: Positive for back pain.  Skin: Negative for rash.  Neurological: Negative for headaches.     Physical Exam Updated Vital Signs BP 134/86   Pulse 89   Temp 99.7 F (37.6 C) (Oral)   Resp 16   Ht 5\' 4"  (1.626 m)   Wt 72.6 kg   SpO2 100%   BMI 27.46 kg/m     Physical Exam  Constitutional: She appears well-developed and well-nourished. No distress.  HENT:  Head: Normocephalic and atraumatic.  Eyes: Conjunctivae are normal.  Neck: Neck supple.  Cardiovascular: Normal rate and regular rhythm.  No murmur heard. Pulmonary/Chest: Effort normal and breath sounds normal. No respiratory distress.  Abdominal: Soft. She exhibits no mass. There is tenderness (mild) in the right lower quadrant, suprapubic area and left lower quadrant. There is no rigidity and no guarding.  Musculoskeletal: She exhibits no deformity.  Neurological: She is alert. Gait (uses cane) abnormal. GCS eye subscore is 4. GCS verbal subscore is 5. GCS motor subscore is 6.  Skin: Skin is warm and dry.  Psychiatric: She has a normal mood and affect.  Nursing note and vitals reviewed.    ED Treatments / Results  Labs (all labs ordered are listed, but only abnormal results are displayed) Labs Reviewed  COMPREHENSIVE METABOLIC PANEL - Abnormal; Notable for the following components:      Result Value   Sodium 134 (*)    Glucose, Bld 121 (*)    Total Protein 9.3 (*)    GFR calc non Af Amer 59 (*)    All other components within normal limits  CBC WITH DIFFERENTIAL/PLATELET  LIPASE, BLOOD    EKG None  Radiology Ct Abdomen Pelvis W Contrast  Result Date: 04/28/2018 CLINICAL DATA:  Abdominal pain, nausea, vomiting and diarrhea for 3 days. EXAM: CT ABDOMEN AND PELVIS WITH CONTRAST TECHNIQUE: Multidetector CT imaging of the abdomen and pelvis was performed using the standard protocol following bolus administration of intravenous contrast. CONTRAST:  113mL OMNIPAQUE IOHEXOL 300 MG/ML  SOLN COMPARISON:  CT scan 06/18/2014 FINDINGS: Lower chest: The lung bases are clear of acute process. No pleural effusion or pulmonary lesions. The heart is normal in size. No pericardial effusion. The distal esophagus and aorta are unremarkable. Hepatobiliary: The large left hepatic lobe cyst seen on  prior studies has resolved. It may have ruptured or involuted. Segment 5 lesion appears stable going back to a lumbar spine MRI from 2012 and is most likely a benign hepatic hemangioma. Small segment 6 cyst is noted.  No worrisome hepatic lesions. The portal and hepatic veins are patent. The gallbladder is normal.  No common bile duct dilatation. Pancreas: No mass, inflammation or ductal dilatation. Spleen: Normal size.  No focal lesions. Adrenals/Urinary Tract: Stable enlarged left adrenal gland likely adrenal gland hyperplasia. The right adrenal gland is normal. No worrisome renal lesions, renal calculi or hydronephrosis. The bladder is unremarkable. A urachal remnant is noted. Stomach/Bowel: The stomach, duodenum, small bowel and colon are unremarkable. No acute inflammatory changes, mass lesions or obstructive findings. The terminal ileum is normal. The appendix is normal. Stable giant diverticuli involving the cecum. Vascular/Lymphatic: Advanced atherosclerotic calcifications involving the distal aorta and iliac arteries. No aneurysm or dissection. The branch vessels are patent. The major venous structures are patent. No mesenteric or retroperitoneal mass or adenopathy. Small scattered lymph nodes are stable. Reproductive: The uterus and ovaries are unremarkable. Other: No pelvic mass or adenopathy. No free pelvic fluid collections. No inguinal mass or adenopathy. No abdominal wall hernia or subcutaneous lesions. Musculoskeletal: No significant bony findings. Advanced facet disease noted in the lower lumbar spine. IMPRESSION: 1. No acute abdominal/pelvic findings, mass lesions or adenopathy. 2. Stable right hepatic lobe lesions. The large left hepatic lobe cyst has resolved. 3. Age advanced distal aortic and iliac artery calcifications but no aneurysm. 4. Scattered colonic diverticulosis but no findings for acute diverticulitis. Electronically Signed   By: Marijo Sanes M.D.   On: 04/28/2018 10:35     Procedures Procedures (including critical care time)  Medications Ordered in ED Medications  morphine 4 MG/ML injection 4 mg (has no administration in time range)  ondansetron (ZOFRAN) injection 4 mg (has no administration in time range)  sodium chloride 0.9 % bolus 1,000 mL (has no administration in time range)     Initial Impression / Assessment and Plan / ED Course  I have reviewed the triage vital signs and the nursing notes.  Pertinent labs & imaging results that were available during my care of the patient were reviewed by me and considered in my medical decision making (see chart for details).  Clinical Course as of Apr 29 937  [facility] Apr 28, 2018  0806  Patient just got her IV and is getting some medication now.   [MB]  8315 I reviewed the patient's results with her including fairly unremarkable CAT scan.  She is comfortable going home and we will send her home with some nausea medicine and diarrhea medicine.  She understands to follow-up with her doctor and return if any worsening symptoms.   [MB]    Clinical Course User Index [MB] Hayden Rasmussen, MD     Final Clinical Impressions(s) / ED Diagnoses   Final diagnoses:  Nausea vomiting and diarrhea    ED Discharge Orders         Ordered    loperamide (IMODIUM) 2 MG capsule  4 times daily PRN     04/28/18 1041    ondansetron (ZOFRAN) 4 MG tablet  Every 8 hours PRN     04/28/18 1041           Hayden Rasmussen, MD 04/29/18 [phone]

## 2018-04-28 NOTE — ED Notes (Signed)
Patient transported to CT 

## 2018-04-28 NOTE — ED Notes (Signed)
Pt ambulates to bathroom with assist

## 2018-04-28 NOTE — ED Triage Notes (Signed)
Pt arrives with c/o abdominal pain with n/v/d. X 3 days.

## 2018-04-28 NOTE — Discharge Instructions (Signed)
You were evaluated in the emergency department for abdominal pain with nausea vomiting and diarrhea.  You had blood work and a CAT scan received some IV fluids and medication for pain and nausea.  Your test did not show any serious findings.  We are sending you home with some nausea medicine and some diarrhea medicine.  Will be important for you to stay well-hydrated.  Please follow-up with your doctor return if any worsening symptoms.

## 2018-05-09 ENCOUNTER — Ambulatory Visit (INDEPENDENT_AMBULATORY_CARE_PROVIDER_SITE_OTHER): Payer: Medicare HMO | Admitting: Family Medicine

## 2018-05-09 VITALS — BP 110/75 | HR 92 | Temp 98.2°F | Wt 159.2 lb

## 2018-05-09 DIAGNOSIS — M545 Low back pain, unspecified: Secondary | ICD-10-CM

## 2018-05-09 DIAGNOSIS — G8929 Other chronic pain: Secondary | ICD-10-CM

## 2018-05-09 DIAGNOSIS — Z716 Tobacco abuse counseling: Secondary | ICD-10-CM

## 2018-05-09 DIAGNOSIS — J4520 Mild intermittent asthma, uncomplicated: Secondary | ICD-10-CM

## 2018-05-09 MED ORDER — KETOROLAC TROMETHAMINE 30 MG/ML IJ SOLN
30.0000 mg | Freq: Once | INTRAMUSCULAR | Status: AC
  Administered 2018-05-09: 30 mg via INTRAMUSCULAR

## 2018-05-09 MED ORDER — ALBUTEROL SULFATE HFA 108 (90 BASE) MCG/ACT IN AERS: 2.0000 | INHALATION_SPRAY | Freq: Four times a day (QID) | RESPIRATORY_TRACT | 5 refills | Status: DC | PRN

## 2018-05-09 NOTE — Patient Instructions (Addendum)
It was great to see you again today!  toradol shot today Will reorder referral to Dr. Brien Few.  Refilled albuterol inhaler  Follow up with me in the new year for your physical  Call [facility] to schedule your colonoscopy   [phone]  Be well, Dr. [name]

## 2018-05-09 NOTE — Assessment & Plan Note (Signed)
Expresses increased willingness to quit smoking, but states preference to quitting w/o use of patches. Was recently able to forgo smoking for several weeks due to GI illness and feels capable to doing that again. Encouraged efforts at cessation. Will continue to revisit at future appts
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
## 2018-05-09 NOTE — Assessment & Plan Note (Signed)
-  Continue Tramadol 100mg  three times daily - no rx's given as patient has enough on file -Give Toradol 30mg  shot for acute relief -Referral to [facility], specifically Dr. Geraldine Contras per pt's request

## 2018-05-09 NOTE — Progress Notes (Signed)
error 

## 2018-05-09 NOTE — Assessment & Plan Note (Signed)
Well controlled. Continue current regimen. Refill inhaler. Encourage smoking cessation.

## 2018-05-28 ENCOUNTER — Ambulatory Visit (INDEPENDENT_AMBULATORY_CARE_PROVIDER_SITE_OTHER): Payer: Medicare HMO | Admitting: Family Medicine

## 2018-05-28 ENCOUNTER — Other Ambulatory Visit: Payer: Self-pay

## 2018-05-28 ENCOUNTER — Encounter: Payer: Self-pay | Admitting: Family Medicine

## 2018-05-28 VITALS — BP 120/70 | HR 77 | Temp 98.0°F | Ht 64.0 in

## 2018-05-28 DIAGNOSIS — M545 Low back pain, unspecified: Secondary | ICD-10-CM

## 2018-05-28 DIAGNOSIS — G8929 Other chronic pain: Secondary | ICD-10-CM

## 2018-05-28 DIAGNOSIS — Z0000 Encounter for general adult medical examination without abnormal findings: Secondary | ICD-10-CM | POA: Diagnosis not present

## 2018-05-28 MED ORDER — TRAMADOL HCL 50 MG PO TABS: ORAL_TABLET | ORAL | 2 refills | Status: DC

## 2018-05-28 NOTE — Progress Notes (Signed)
Date of Visit: 05/28/2018   HPI:  Patient presents today for a well woman exam.   Concerns today: refill of tramadol Periods: none, postmenopausal Contraception: postmenopausaul Pelvic symptoms: no pelvic pain or discharge  Sexual activity: husband STD Screening: declines screening Pap smear status: no longer needs screening Exercise: no Smoking: 1/4 ppd, working on cutting down on smoking Alcohol: very occasional Drugs: none Advance directives: very clear that she wants to be full code Mood: no concerns  Chronic pain - takes tramadol 50mg  2 pills three times daily. Doesn't seem to help a whole lot but still takes it. Does not want to try baclofen or flexeril. Having increased pain, says possibly due to weather changing. Pending referral back to PM&R for injections.  ROS: See HPI  [place]: history of chronic pain, smoking, asthma, hypertension, GERD, obesity  PHYSICAL EXAM: BP 120/70 (BP Location: Left Arm, Patient Position: Sitting, Cuff Size: Normal)   Pulse 77   Temp 98 F (36.7 C) (Oral)   Ht 5\' 4"  (1.626 m)   SpO2 98%   BMI 27.33 kg/m  Gen: NAD, pleasant, cooperative HEENT: NCAT, PERRL, no palpable thyromegaly or anterior cervical lymphadenopathy Heart: RRR, no murmurs Lungs: CTAB, NWOB Abdomen: soft, nontender to palpation Neuro: grossly nonfocal, speech normal Extremities: full strength bilateral lower extremities   ASSESSMENT/PLAN:  Health maintenance:  -STD screening: declines -pap smear: no further paps needed -mammogram: UTD -lipid screening: current -DEXA: UTD -immunizations: reminded of need for Tdap -colonoscopy: gave phone # to patient to call GI & schedule -advance directives: discussed with patient, she wants to be full code -handout given on health maintenance topics  Encounter for chronic pain management Entered referral for injections since I evidently did not order this last time. Refill tramadol.   FOLLOW UP: Follow up in 3 months for  chronic pain Referring for back injections  [place] J. [name], [place]

## 2018-05-28 NOTE — Patient Instructions (Addendum)
Call [facility] to schedule your colonoscopy  [phone]  Call 1800-QUIT-NOW for help with stopping smoking.  Go get tetanus shot    Health Maintenance for Postmenopausal Women Menopause is a normal process in which your reproductive ability comes to an end. This process happens gradually over a span of months to years, usually between the ages of 63 and 1. Menopause is complete when you have missed 12 consecutive menstrual periods. It is important to talk with your health care provider about some of the most common conditions that affect postmenopausal women, such as heart disease, cancer, and bone loss (osteoporosis). Adopting a healthy lifestyle and getting preventive care can help to promote your health and wellness. Those actions can also lower your chances of developing some of these common conditions. What should I know about menopause? During menopause, you may experience a number of symptoms, such as:  Moderate-to-severe hot flashes.  Night sweats.  Decrease in sex drive.  Mood swings.  Headaches.  Tiredness.  Irritability.  Memory problems.  Insomnia. Choosing to treat or not to treat menopausal changes is an individual decision that you make with your health care provider. What should I know about hormone replacement therapy and supplements? Hormone therapy products are effective for treating symptoms that are associated with menopause, such as hot flashes and night sweats. Hormone replacement carries certain risks, especially as you become older. If you are thinking about using estrogen or estrogen with progestin treatments, discuss the benefits and risks with your health care provider. What should I know about heart disease and stroke? Heart disease, heart attack, and stroke become more likely as you age. This may be due, in part, to the hormonal changes that your body experiences during menopause. These can affect how your body processes dietary fats,  triglycerides, and cholesterol. Heart attack and stroke are both medical emergencies. There are many things that you can do to help prevent heart disease and stroke:  Have your blood pressure checked at least every 1-2 years. High blood pressure causes heart disease and increases the risk of stroke.  If you are 43-15 years old, ask your health care provider if you should take aspirin to prevent a heart attack or a stroke.  Do not use any tobacco products, including cigarettes, chewing tobacco, or electronic cigarettes. If you need help quitting, ask your health care provider.  It is important to eat a healthy diet and maintain a healthy weight. ? Be sure to include plenty of vegetables, fruits, low-fat dairy products, and lean protein. ? Avoid eating foods that are high in solid fats, added sugars, or salt (sodium).  Get regular exercise. This is one of the most important things that you can do for your health. ? Try to exercise for at least 150 minutes each week. The type of exercise that you do should increase your heart rate and make you sweat. This is known as moderate-intensity exercise. ? Try to do strengthening exercises at least twice each week. Do these in addition to the moderate-intensity exercise.  Know your numbers.Ask your health care provider to check your cholesterol and your blood glucose. Continue to have your blood tested as directed by your health care provider.  What should I know about cancer screening? There are several types of cancer. Take the following steps to reduce your risk and to catch any cancer development as early as possible. Breast Cancer  Practice breast self-awareness. ? This means understanding how your breasts normally appear and feel. ?  It also means doing regular breast self-exams. Let your health care provider know about any changes, no matter how small.  If you are 34 or older, have a clinician do a breast exam (clinical breast exam or CBE)  every year. Depending on your age, family history, and medical history, it may be recommended that you also have a yearly breast X-ray (mammogram).  If you have a family history of breast cancer, talk with your health care provider about genetic screening.  If you are at high risk for breast cancer, talk with your health care provider about having an MRI and a mammogram every year.  Breast cancer (BRCA) gene test is recommended for women who have family members with BRCA-related cancers. Results of the assessment will determine the need for genetic counseling and BRCA1 and for BRCA2 testing. BRCA-related cancers include these types: ? Breast. This occurs in males or females. ? Ovarian. ? Tubal. This may also be called fallopian tube cancer. ? Cancer of the abdominal or pelvic lining (peritoneal cancer). ? Prostate. ? Pancreatic. Cervical, Uterine, and Ovarian Cancer Your health care provider may recommend that you be screened regularly for cancer of the pelvic organs. These include your ovaries, uterus, and vagina. This screening involves a pelvic exam, which includes checking for microscopic changes to the surface of your cervix (Pap test).  For women ages 21-65, health care providers may recommend a pelvic exam and a Pap test every three years. For women ages 43-65, they may recommend the Pap test and pelvic exam, combined with testing for human papilloma virus (HPV), every five years. Some types of HPV increase your risk of cervical cancer. Testing for HPV may also be done on women of any age who have unclear Pap test results.  Other health care providers may not recommend any screening for nonpregnant women who are considered low risk for pelvic cancer and have no symptoms. Ask your health care provider if a screening pelvic exam is right for you.  If you have had past treatment for cervical cancer or a condition that could lead to cancer, you need Pap tests and screening for cancer for at  least 20 years after your treatment. If Pap tests have been discontinued for you, your risk factors (such as having a new sexual partner) need to be reassessed to determine if you should start having screenings again. Some women have medical problems that increase the chance of getting cervical cancer. In these cases, your health care provider may recommend that you have screening and Pap tests more often.  If you have a family history of uterine cancer or ovarian cancer, talk with your health care provider about genetic screening.  If you have vaginal bleeding after reaching menopause, tell your health care provider.  There are currently no reliable tests available to screen for ovarian cancer. Lung Cancer Lung cancer screening is recommended for adults 42-33 years old who are at high risk for lung cancer because of a history of smoking. A yearly low-dose CT scan of the lungs is recommended if you:  Currently smoke.  Have a history of at least 30 pack-years of smoking and you currently smoke or have quit within the past 15 years. A pack-year is smoking an average of one pack of cigarettes per day for one year. Yearly screening should:  Continue until it has been 15 years since you quit.  Stop if you develop a health problem that would prevent you from having lung cancer treatment. Colorectal Cancer  This type of cancer can be detected and can often be prevented.  Routine colorectal cancer screening usually begins at age 3 and continues through age 67.  If you have risk factors for colon cancer, your health care provider may recommend that you be screened at an earlier age.  If you have a family history of colorectal cancer, talk with your health care provider about genetic screening.  Your health care provider may also recommend using home test kits to check for hidden blood in your stool.  A small camera at the end of a tube can be used to examine your colon directly (sigmoidoscopy  or colonoscopy). This is done to check for the earliest forms of colorectal cancer.  Direct examination of the colon should be repeated every 5-10 years until age 56. However, if early forms of precancerous polyps or small growths are found or if you have a family history or genetic risk for colorectal cancer, you may need to be screened more often. Skin Cancer  Check your skin from head to toe regularly.  Monitor any moles. Be sure to tell your health care provider: ? About any new moles or changes in moles, especially if there is a change in a mole's shape or color. ? If you have a mole that is larger than the size of a pencil eraser.  If any of your family members has a history of skin cancer, especially at a young age, talk with your health care provider about genetic screening.  Always use sunscreen. Apply sunscreen liberally and repeatedly throughout the day.  Whenever you are outside, protect yourself by wearing long sleeves, pants, a wide-brimmed hat, and sunglasses. What should I know about osteoporosis? Osteoporosis is a condition in which bone destruction happens more quickly than new bone creation. After menopause, you may be at an increased risk for osteoporosis. To help prevent osteoporosis or the bone fractures that can happen because of osteoporosis, the following is recommended:  If you are 31-69 years old, get at least 1,000 mg of calcium and at least 600 mg of vitamin D per day.  If you are older than age 33 but younger than age 70, get at least 1,200 mg of calcium and at least 600 mg of vitamin D per day.  If you are older than age 26, get at least 1,200 mg of calcium and at least 800 mg of vitamin D per day. Smoking and excessive alcohol intake increase the risk of osteoporosis. Eat foods that are rich in calcium and vitamin D, and do weight-bearing exercises several times each week as directed by your health care provider. What should I know about how menopause affects  my mental health? Depression may occur at any age, but it is more common as you become older. Common symptoms of depression include:  Low or sad mood.  Changes in sleep patterns.  Changes in appetite or eating patterns.  Feeling an overall lack of motivation or enjoyment of activities that you previously enjoyed.  Frequent crying spells. Talk with your health care provider if you think that you are experiencing depression. What should I know about immunizations? It is important that you get and maintain your immunizations. These include:  Tetanus, diphtheria, and pertussis (Tdap) booster vaccine.  Influenza every year before the flu season begins.  Pneumonia vaccine.  Shingles vaccine. Your health care provider may also recommend other immunizations. This information is not intended to replace advice given to you by your health care provider. Make  sure you discuss any questions you have with your health care provider. Document Released: 06/30/2005 Document Revised: 11/26/2015 Document Reviewed: 02/09/2015 Elsevier Interactive Patient Education  2019 [employer].

## 2018-05-31 NOTE — Assessment & Plan Note (Signed)
Entered referral for injections since I evidently did not order this last time. Refill tramadol.

## 2018-08-26 ENCOUNTER — Telehealth: Payer: Self-pay | Admitting: Family Medicine

## 2018-08-26 MED ORDER — GABAPENTIN 800 MG PO TABS: 800.0000 mg | ORAL_TABLET | Freq: Three times a day (TID) | ORAL | 1 refills | Status: DC

## 2018-08-26 MED ORDER — TRAMADOL HCL 50 MG PO TABS: ORAL_TABLET | ORAL | 1 refills | Status: DC

## 2018-08-26 MED ORDER — AMOXICILLIN-POT CLAVULANATE 875-125 MG PO TABS: 1.0000 | ORAL_TABLET | Freq: Two times a day (BID) | ORAL | 0 refills | Status: DC

## 2018-08-26 NOTE — Telephone Encounter (Signed)
Called patient to discuss.  Having pain in R lower tooth. Hurting for about 2 weeks. No fever. Eating and drinking well. Used warm water & salt. No swelling in face or neck.  Patient wants rx for augmentin. This is reasonable. Will send in to [facility] as requested.  Encouraged patient to stay home as much as possible to avoid exposure to COVID. Educated on emphasis of telehealth right now to minimize risk of disease transmission. Advised she call us with any concerns or needs.   Patient appreciative. Leeanne Rio, MD

## 2018-08-26 NOTE — Telephone Encounter (Signed)
[facility] Please let patient know this has been done Thanks! Leeanne Rio, MD

## 2018-08-26 NOTE — Telephone Encounter (Signed)
Pt called requesting a refill on her gabapentin. She also would like her tramadol refilled through the [facility] Delivery as well. She doesn't want to have to go to the pharmacy at this time due to [place]. Please call pt once this has been done.

## 2018-08-26 NOTE — Telephone Encounter (Signed)
Pt informed. Pt states she needs amoxicillin for her tooth she broke like you did last time and wants it sent to [facility] as well. Please advise. Maliah Pyles Kennon Holter, CMA

## 2018-09-02 ENCOUNTER — Other Ambulatory Visit: Payer: Self-pay | Admitting: Family Medicine

## 2018-09-27 ENCOUNTER — Other Ambulatory Visit: Payer: Self-pay

## 2018-09-27 ENCOUNTER — Telehealth (INDEPENDENT_AMBULATORY_CARE_PROVIDER_SITE_OTHER): Payer: Medicare HMO | Admitting: Family Medicine

## 2018-09-27 ENCOUNTER — Other Ambulatory Visit: Payer: Self-pay | Admitting: Family Medicine

## 2018-09-27 DIAGNOSIS — G8929 Other chronic pain: Secondary | ICD-10-CM

## 2018-09-27 DIAGNOSIS — I10 Essential (primary) hypertension: Secondary | ICD-10-CM | POA: Diagnosis not present

## 2018-09-27 MED ORDER — TRAMADOL HCL 50 MG PO TABS: ORAL_TABLET | ORAL | 1 refills | Status: DC

## 2018-09-27 NOTE — Assessment & Plan Note (Signed)
Stable pain overall. Agree with trying topical lidocaine over the counter patch. [place] PMP reviewed with appropriate findings. Benefits of tramadol continue to outweigh risks. Refill for 2 additional months provided (has 1 refill already on file).  Follow up in 3 months.

## 2018-09-27 NOTE — Progress Notes (Signed)
[place] Telemedicine Visit  Patient consented to have virtual visit. Method of visit: Telephone  Encounter participants: Patient: Raven [name] - located at home Provider: Chrisandra Netters - located at office Others (if applicable): n/a  Chief Complaint: chronic pain f/u  HPI:  Chronic pain - needs refill of tramadol. [place] PMP reviewed, last fill 08/30/18. Having more pinching feeling in the low part of her back but overall pain is stable. Plans to try an over the counter lidocaine patch. No side effects from tramadol. Denies bowel or bladder issues. Takes 100mg  three times daily of tramadol.  Hypertension - taking lisinopril-hctz 10-12.5mg  daily. Does not have blood pressure monitor at home but plans to buy one soon. No chest pain.   She is staying home and social distancing as much as possible. Wears mask and gloves when going to the store.   ROS: per HPI  Pertinent PMHx: history of chronic pain, hypertension, hyperlipidemia   Exam:  Respiratory: Patient speaking normally in full sentences throughout the phone encounter, without any respiratory distress evident.   Assessment/Plan:  HYPERTENSION, BENIGN SYSTEMIC Compliant with blood pressure medication, not able to assess control as patient does not have monitor at home though she plans to get one soon.  Encounter for chronic pain management Stable pain overall. Agree with trying topical lidocaine over the counter patch. [place] PMP reviewed with appropriate findings. Benefits of tramadol continue to outweigh risks. Refill for 2 additional months provided (has 1 refill already on file).  Follow up in 3 months.   Health maintenance: Remains due for colonoscopy & tdap, but these are on hold right now as patient prefers to avoid doctors offices in light of [place] pandemic.  Time spent during visit with patient: 15 minutes

## 2018-09-27 NOTE — Assessment & Plan Note (Signed)
Compliant with blood pressure medication, not able to assess control as patient does not have monitor at home though she plans to get one soon.

## 2018-09-27 NOTE — Addendum Note (Signed)
Addended by: Leeanne Rio on: 09/27/2018 09:42 AM   Modules accepted: Orders

## 2018-11-21 ENCOUNTER — Other Ambulatory Visit: Payer: Self-pay | Admitting: Family Medicine

## 2019-01-01 ENCOUNTER — Other Ambulatory Visit: Payer: Self-pay

## 2019-01-01 MED ORDER — TRAMADOL HCL 50 MG PO TABS: ORAL_TABLET | ORAL | 0 refills | Status: DC

## 2019-01-01 MED ORDER — GABAPENTIN 800 MG PO TABS: 800.0000 mg | ORAL_TABLET | Freq: Three times a day (TID) | ORAL | 1 refills | Status: DC

## 2019-01-14 ENCOUNTER — Ambulatory Visit (INDEPENDENT_AMBULATORY_CARE_PROVIDER_SITE_OTHER): Payer: Medicare HMO | Admitting: Family Medicine

## 2019-01-14 ENCOUNTER — Other Ambulatory Visit: Payer: Self-pay

## 2019-01-14 VITALS — BP 125/75 | HR 85 | Wt 157.2 lb

## 2019-01-14 DIAGNOSIS — G8929 Other chronic pain: Secondary | ICD-10-CM

## 2019-01-14 MED ORDER — KETOROLAC TROMETHAMINE 30 MG/ML IJ SOLN
30.0000 mg | Freq: Once | INTRAMUSCULAR | Status: AC
  Administered 2019-01-14: 11:00:00 30 mg via INTRAMUSCULAR

## 2019-01-14 MED ORDER — TRAMADOL HCL 50 MG PO TABS: ORAL_TABLET | ORAL | 0 refills | Status: DC

## 2019-01-14 NOTE — Patient Instructions (Signed)
It was great to see you again today!  For tapering down on tramadol:  Week 1: 2 AM, 1 lunch, 2 PM Week 2: 2 AM, 1 lunch, 1 PM Week 3: 1 AM, 1 lunch, 1 PM Week 4: 1 AM, 0 lunch, 1 PM Week 5: 1 AM, 0 lunch, 0 PM Week 6 stop  toradol shot today. Sent in refill on tramadol  Follow up in 1-2 months for your back pain Call with any questions.  Be well, Dr. [name]

## 2019-01-14 NOTE — Assessment & Plan Note (Addendum)
Pain stable but not controlled with tramadol according to patient. Patient interested in stopping medication. Discussed ways to wean off tramadol and plans moving forward. See after visit summary for taper instructions

## 2019-01-14 NOTE — Progress Notes (Signed)
Date of Visit: 01/14/2019   HPI:  Elainea presents for follow up.  Chronic pain - taking tramadol 100mg  three times daily. Pain is stable. Denies fever, incontinence, or numbness. Some shooting pain in her left leg which is typical for her. Patient also interested in decreasing the number of her regular medication. Patient does not think tramadol does much for her pain, prefers to come off it. Would like to have toradol shot today.  Expresses concern for getting a coloscopy during COVID out of concern for exposure - wants to wait on this.  ROS: See HPI.  [place]: history of hyperlipidemia, tobacco abuse, hypertension, asthma, GERD, chronic pain, obesity  PHYSICAL EXAM: BP 125/75   Pulse 85   Wt 157 lb 3.2 oz (71.3 kg)   SpO2 97%   BMI 26.98 kg/m  Gen: no acute distress, pleasant, cooperative HEENT: normocephalic, atraumatic  Neuro: alert, grossly nonfocal, speech normal, gait normal  ASSESSMENT/PLAN:  Health maintenance:  -defer colonoscopy until after COVID pandemic has calmed, per patient preference  Encounter for chronic pain management Pain stable but not controlled with tramadol according to patient. Patient interested in stopping medication. Discussed ways to wean off tramadol and plans moving forward. See after visit summary for taper instructions  FOLLOW UP: Follow up in 1-2 mos for back pain  [place] J. [name], [place]

## 2019-02-10 ENCOUNTER — Other Ambulatory Visit: Payer: Self-pay

## 2019-02-10 ENCOUNTER — Ambulatory Visit (INDEPENDENT_AMBULATORY_CARE_PROVIDER_SITE_OTHER): Payer: Medicare HMO | Admitting: *Deleted

## 2019-02-10 DIAGNOSIS — Z23 Encounter for immunization: Secondary | ICD-10-CM

## 2019-02-21 ENCOUNTER — Ambulatory Visit: Payer: Medicare HMO | Admitting: Family Medicine

## 2019-02-25 ENCOUNTER — Ambulatory Visit: Payer: Medicare HMO | Admitting: Family Medicine

## 2019-03-24 ENCOUNTER — Other Ambulatory Visit: Payer: Self-pay | Admitting: Family Medicine

## 2019-03-24 DIAGNOSIS — Z1231 Encounter for screening mammogram for malignant neoplasm of breast: Secondary | ICD-10-CM

## 2019-03-29 ENCOUNTER — Other Ambulatory Visit: Payer: Self-pay | Admitting: Family Medicine

## 2019-04-07 ENCOUNTER — Other Ambulatory Visit: Payer: Self-pay | Admitting: *Deleted

## 2019-04-09 ENCOUNTER — Telehealth: Payer: Self-pay | Admitting: Family Medicine

## 2019-04-09 MED ORDER — TRAMADOL HCL 50 MG PO TABS: ORAL_TABLET | ORAL | 0 refills | Status: DC

## 2019-04-09 NOTE — Telephone Encounter (Signed)
Patient says that her [facility] plan told her that they have sent in two requests for her to have her Tramadol refilled but it has not been filled yet.  It is supposed to go to mail order [place].

## 2019-04-09 NOTE — Telephone Encounter (Signed)
rx sent in earlier today Leeanne Rio, MD

## 2019-04-12 ENCOUNTER — Other Ambulatory Visit: Payer: Self-pay | Admitting: Family Medicine

## 2019-04-16 ENCOUNTER — Other Ambulatory Visit: Payer: Self-pay

## 2019-04-16 ENCOUNTER — Encounter: Payer: Self-pay | Admitting: Family Medicine

## 2019-04-16 ENCOUNTER — Ambulatory Visit (INDEPENDENT_AMBULATORY_CARE_PROVIDER_SITE_OTHER): Payer: Medicare HMO | Admitting: Family Medicine

## 2019-04-16 VITALS — BP 130/62 | HR 90 | Wt 161.4 lb

## 2019-04-16 DIAGNOSIS — Z716 Tobacco abuse counseling: Secondary | ICD-10-CM | POA: Diagnosis not present

## 2019-04-16 DIAGNOSIS — J4520 Mild intermittent asthma, uncomplicated: Secondary | ICD-10-CM

## 2019-04-16 DIAGNOSIS — I10 Essential (primary) hypertension: Secondary | ICD-10-CM

## 2019-04-16 DIAGNOSIS — M25552 Pain in left hip: Secondary | ICD-10-CM | POA: Diagnosis not present

## 2019-04-16 DIAGNOSIS — G8929 Other chronic pain: Secondary | ICD-10-CM

## 2019-04-16 MED ORDER — KETOROLAC TROMETHAMINE 30 MG/ML IJ SOLN
30.0000 mg | Freq: Once | INTRAMUSCULAR | Status: AC
  Administered 2019-04-16: 09:00:00 30 mg via INTRAMUSCULAR

## 2019-04-16 MED ORDER — TRAMADOL HCL 50 MG PO TABS: ORAL_TABLET | ORAL | 0 refills | Status: DC

## 2019-04-16 MED ORDER — GABAPENTIN 800 MG PO TABS: 800.0000 mg | ORAL_TABLET | Freq: Three times a day (TID) | ORAL | 1 refills | Status: DC

## 2019-04-16 MED ORDER — ALBUTEROL SULFATE HFA 108 (90 BASE) MCG/ACT IN AERS: 2.0000 | INHALATION_SPRAY | Freq: Four times a day (QID) | RESPIRATORY_TRACT | 5 refills | Status: DC | PRN

## 2019-04-16 NOTE — Progress Notes (Signed)
Subjective  Raven [name] is a 68 y.o. female is presenting with the following  [place] Has lost her sister and two other relatives in last few months.  Actually feels ok.  She just focuses on current things.   It does make her concerned about her personally having heart disease as her sister died from what sounds like sudden arrest after CHF.   She has never had chest pain with exertion or history of CAD   LEG BACK PAIN Chronic lower back and leg pain.  Wishes refill on tramadol.  Does not recall that Dr [name] had planned to wean her off.   Agrees she can probably take one per day and use tylenol and continue her gabapentin She wishes a shot of toradol today which she gets every few months.  ASTHMA Uses albuterol as needed several times a week would like a refill. Hopes to stop smoking but Jan 1  No significant shortness of breath with exertion.  "can walk for hours around [employer]"   Chief Complaint noted Review of Symptoms - see HPI PMH - Smoking status noted.    Objective Vital Signs reviewed BP 130/62   Pulse 90   Wt 161 lb 6.4 oz (73.2 kg)   SpO2 98%   BMI 27.70 kg/m  Heart - Regular rate and rhythm.  No murmurs, gallops or rubs.    Lungs:  Normal respiratory effort, chest expands symmetrically. Lungs are clear to auscultation, no crackles or wheezes. Extremities:  No cyanosis, edema, or deformity noted with good range of motion of all major joints.   Able to get up and down from exam table without obvious pain or trouble   Assessments/Plans  Asthma Stable.  Refilled albuterol   HYPERTENSION, BENIGN SYSTEMIC BP Readings from Last 3 Encounters:  04/16/19 130/62  01/14/19 125/75  05/28/18 120/70   At goal today  Encounter for chronic pain management Stable.  Patient had not weaned off tramadol.  Given recent deaths in family gave Rx for 30 with no refill and she needs to discuss further with Dr [name].  Did give her toradol shot today    Tobacco abuse counseling Discussed quitiing. She decided would aim for Feb given her recent deaths and covid.  She agreed to take the [place] Quitline "although I won't use it"   See after visit summary for details of patient instructions

## 2019-04-16 NOTE — Patient Instructions (Addendum)
Good to see you today!  Thanks for coming in.  For Keeping your Heart Healthy  Stop smoking - aim to stop by Feb    Consider calling the 800   I will refill your tramadol 30 tabs and you can contact Dr [name] for future needs  If stress from your losses worsens you would contact Dr [name]  Follow up with Dr [name] in 2-3 months - you can have a virtual visit if you like

## 2019-04-16 NOTE — Assessment & Plan Note (Signed)
Stable.  Patient had not weaned off tramadol.  Given recent deaths in family gave Rx for 30 with no refill and she needs to discuss further with Dr [name].  Did give her toradol shot today

## 2019-04-16 NOTE — Assessment & Plan Note (Signed)
Stable Refilled albuterol  

## 2019-04-16 NOTE — Assessment & Plan Note (Signed)
Discussed quitiing. She decided would aim for Feb given her recent deaths and covid.  She agreed to take the [facility] Quitline "although I won't use it"

## 2019-04-16 NOTE — Assessment & Plan Note (Signed)
BP Readings from Last 3 Encounters:  04/16/19 130/62  01/14/19 125/75  05/28/18 120/70   At goal today

## 2019-04-24 ENCOUNTER — Ambulatory Visit: Payer: Medicare HMO | Admitting: Family Medicine

## 2019-05-08 ENCOUNTER — Ambulatory Visit: Payer: Medicare HMO | Admitting: Family Medicine

## 2019-05-13 ENCOUNTER — Encounter: Payer: Self-pay | Admitting: Family Medicine

## 2019-05-13 ENCOUNTER — Other Ambulatory Visit: Payer: Self-pay

## 2019-05-13 ENCOUNTER — Ambulatory Visit (INDEPENDENT_AMBULATORY_CARE_PROVIDER_SITE_OTHER): Payer: Medicare HMO | Admitting: Family Medicine

## 2019-05-13 VITALS — BP 120/62 | HR 81 | Wt 157.0 lb

## 2019-05-13 DIAGNOSIS — Z716 Tobacco abuse counseling: Secondary | ICD-10-CM | POA: Diagnosis not present

## 2019-05-13 DIAGNOSIS — G8929 Other chronic pain: Secondary | ICD-10-CM

## 2019-05-13 DIAGNOSIS — E785 Hyperlipidemia, unspecified: Secondary | ICD-10-CM

## 2019-05-13 MED ORDER — TETANUS-DIPHTH-ACELL PERTUSSIS 5-2.5-18.5 LF-MCG/0.5 IM SUSP: 0.5000 mL | Freq: Once | INTRAMUSCULAR | 0 refills | Status: AC

## 2019-05-13 NOTE — Patient Instructions (Addendum)
Call [name] to schedule your colonoscopy [phone]  Get tetanus shot  Checking labs today  Keep working on quitting smoking  Call with any questions  Be well, Dr. [name]

## 2019-05-13 NOTE — Progress Notes (Signed)
Date of Visit: 05/13/2019   HPI:  Raven [name] presents for follow up.  She is worried about her heart, as her sister recently died from heart issues at age 69 and this has her nervous about herself. Patient denies any chest pain or shortness of breath. Able to walk around without any chest discomfort or other issues.  Hyperlipidemia - taking atorvastatin 40mg  daily, tolerating well. Due for labs. Fasting today.  Chronic pain - taking tramadol 2 pills in AM and occasionally one tablet at night. Still helping her pain. Had a flareup of pain last week, applied aspercreme with good relief.  Tobacco abuse - continues to smoke. Recently went a period of time with reduced smoking. Motivated to keep working on quitting.  ROS: See HPI.  [place]: history of tobacco abuse, hyperlipidemia, hypertension, obesity, GERD, asthma  PHYSICAL EXAM: BP 120/62   Pulse 81   Wt 157 lb (71.2 kg)   SpO2 97%   BMI 26.95 kg/m  Gen: no acute distress, pleasant, cooperative HEENT: normocephalic, atraumatic  Heart: regular rate and rhythm, no murmur Lungs: clear to auscultation bilaterally, normal work of breathing  Neuro: alert, speech normal Ext: No appreciable lower extremity edema bilaterally   ASSESSMENT/PLAN:  Health maintenance:  -given rx for Tdap -has mammogram scheduled tomororw -provided colonoscopy phone # to call  Encounter for chronic pain management Stable. Continue tramadol  Hyperlipidemia Check lipids & CMET today  Tobacco abuse counseling Motivated to quit. Counseled on importance of quitting to protect her heart.  FOLLOW UP: Follow up in 3 mos for routine medical issues  Raven [name], [place]

## 2019-05-14 ENCOUNTER — Ambulatory Visit
Admission: RE | Admit: 2019-05-14 | Discharge: 2019-05-14 | Disposition: A | Discharge status: Home / Self Care | Payer: Medicare HMO | Source: Ambulatory Visit | Attending: Family Medicine | Admitting: Family Medicine

## 2019-05-14 DIAGNOSIS — Z1231 Encounter for screening mammogram for malignant neoplasm of breast: Secondary | ICD-10-CM | POA: Diagnosis not present

## 2019-05-14 LAB — CMP14+EGFR
ALT: 18 IU/L (ref 0–32)
AST: 24 IU/L (ref 0–40)
Albumin/Globulin Ratio: 1.4 (ref 1.2–2.2)
Albumin: 4.9 g/dL — ABNORMAL HIGH (ref 3.8–4.8)
Alkaline Phosphatase: 130 IU/L — ABNORMAL HIGH (ref 39–117)
BUN/Creatinine Ratio: 17 (ref 12–28)
BUN: 15 mg/dL (ref 8–27)
Bilirubin Total: <0.2 mg/dL (ref 0.0–1.2)
CO2: 21 mmol/L (ref 20–29)
Calcium: 10.1 mg/dL (ref 8.7–10.3)
Chloride: 101 mmol/L (ref 96–106)
Creatinine, Ser: 0.9 mg/dL (ref 0.57–1.00)
GFR calc Af Amer: 75 mL/min/{1.73_m2} (ref >59)
GFR calc non Af Amer: 65 mL/min/{1.73_m2} (ref >59)
Globulin, Total: 3.4 g/dL (ref 1.5–4.5)
Glucose: 90 mg/dL (ref 65–99)
Potassium: 4.9 mmol/L (ref 3.5–5.2)
Sodium: 137 mmol/L (ref 134–144)
Total Protein: 8.3 g/dL (ref 6.0–8.5)

## 2019-05-14 LAB — LIPID PANEL
Chol/HDL Ratio: 2.7 ratio (ref 0.0–4.4)
Cholesterol, Total: 151 mg/dL (ref 100–199)
HDL: 55 mg/dL (ref >39)
LDL Chol Calc (NIH): 79 mg/dL (ref 0–99)
Triglycerides: 90 mg/dL (ref 0–149)
VLDL Cholesterol Cal: 17 mg/dL (ref 5–40)

## 2019-05-14 NOTE — Assessment & Plan Note (Signed)
Motivated to quit. Counseled on importance of quitting to protect her heart.

## 2019-05-14 NOTE — Assessment & Plan Note (Signed)
Stable. Continue tramadol.

## 2019-05-14 NOTE — Assessment & Plan Note (Signed)
Check lipids & CMET today. 

## 2019-05-23 HISTORY — PX: COLONOSCOPY: SHX174

## 2019-06-09 ENCOUNTER — Telehealth: Payer: Self-pay | Admitting: Family Medicine

## 2019-06-09 NOTE — Telephone Encounter (Signed)
Called patient to discuss labs. Alk phos mildly elevated. Will repeat at follow up in a few months, also check GGT at that time. Patient appreciative.  Also helped her sign up for the waitlist for the COVID vaccine. She was appreciative.  Leeanne Rio, MD

## 2019-07-18 ENCOUNTER — Ambulatory Visit: Payer: Medicare HMO | Attending: Internal Medicine

## 2019-07-18 ENCOUNTER — Telehealth: Payer: Self-pay | Admitting: Family Medicine

## 2019-07-18 DIAGNOSIS — Z23 Encounter for immunization: Secondary | ICD-10-CM | POA: Insufficient documentation

## 2019-07-18 NOTE — Progress Notes (Signed)
   Covid-19 Vaccination Clinic  Name:  Raven [name]    MRN: [id number] DOB: 01/14/1951  07/18/2019  [name] was observed post Covid-19 immunization for 15 minutes without incidence. She was provided with Vaccine Information Sheet and instruction to access the V-Safe system.   [name] was instructed to call 911 with any severe reactions post vaccine: Marland Kitchen Difficulty breathing  . Swelling of your face and throat  . A fast heartbeat  . A bad rash all over your body  . Dizziness and weakness    Immunizations Administered    Name Date Dose VIS Date Route   Pfizer COVID-19 Vaccine 07/18/2019 11:39 AM 0.3 mL 05/02/2019 Intramuscular   Manufacturer: [place]   Lot: [id number]   [name]: [id number]

## 2019-07-18 NOTE — Telephone Encounter (Signed)
Noted, thanks Leeanne Rio, MD

## 2019-07-18 NOTE — Telephone Encounter (Signed)
Patient called to let Dr. [name] know she got her first dose of the COVID vaccine today.

## 2019-07-31 ENCOUNTER — Telehealth: Payer: Self-pay | Admitting: Family Medicine

## 2019-07-31 DIAGNOSIS — Z1211 Encounter for screening for malignant neoplasm of colon: Secondary | ICD-10-CM

## 2019-07-31 NOTE — Telephone Encounter (Signed)
Pt would like for Dr. [name] to place a referral for a colonoscopy. She would like to see Dr. Carlean Purl at [place].

## 2019-07-31 NOTE — Telephone Encounter (Signed)
Referral placed. Please inform patient that I've placed this referral, and also let her know I'm proud of her for getting her colonoscopy :)   Thanks Leeanne Rio, MD

## 2019-08-01 ENCOUNTER — Encounter: Payer: Self-pay | Admitting: Internal Medicine

## 2019-08-01 NOTE — Telephone Encounter (Signed)
LMOVM informing pt. Linna Thebeau, CMA  

## 2019-08-05 ENCOUNTER — Other Ambulatory Visit: Payer: Self-pay

## 2019-08-05 ENCOUNTER — Ambulatory Visit (INDEPENDENT_AMBULATORY_CARE_PROVIDER_SITE_OTHER): Payer: Medicare HMO | Admitting: Family Medicine

## 2019-08-05 VITALS — BP 132/80 | HR 93 | Wt 155.2 lb

## 2019-08-05 DIAGNOSIS — Z716 Tobacco abuse counseling: Secondary | ICD-10-CM

## 2019-08-05 DIAGNOSIS — R748 Abnormal levels of other serum enzymes: Secondary | ICD-10-CM | POA: Diagnosis not present

## 2019-08-05 DIAGNOSIS — I10 Essential (primary) hypertension: Secondary | ICD-10-CM

## 2019-08-05 DIAGNOSIS — J4520 Mild intermittent asthma, uncomplicated: Secondary | ICD-10-CM

## 2019-08-05 DIAGNOSIS — G8929 Other chronic pain: Secondary | ICD-10-CM | POA: Diagnosis not present

## 2019-08-05 MED ORDER — TETANUS-DIPHTH-ACELL PERTUSSIS 5-2.5-18.5 LF-MCG/0.5 IM SUSP: 0.5000 mL | Freq: Once | INTRAMUSCULAR | 0 refills | Status: AC

## 2019-08-05 NOTE — Patient Instructions (Signed)
It was great to see you again today!  Congratulations on getting off the pain medication and backing off the cigarettes. I am SO PROUD OF YOU!!  Checking labs today - will call or send you a letter  Follow up with me in 6 months, sooner if needed  Be well, Dr. [name]    Steps to Quit Smoking Smoking tobacco is the leading cause of preventable death. It can affect almost every organ in the body. Smoking puts you and those around you at risk for developing many serious chronic diseases. Quitting smoking can be difficult, but it is one of the best things that you can do for your health. It is never too late to quit. How do I get ready to quit? When you decide to quit smoking, create a plan to help you succeed. Before you quit:  Pick a date to quit. Set a date within the next 2 weeks to give you time to prepare.  Write down the reasons why you are quitting. Keep this list in places where you will see it often.  Tell your family, friends, and co-workers that you are quitting. Support from your loved ones can make quitting easier.  Talk with your health care provider about your options for quitting smoking.  Find out what treatment options are covered by your health insurance.  Identify people, places, things, and activities that make you want to smoke (triggers). Avoid them. What first steps can I take to quit smoking?  Throw away all cigarettes at home, at work, and in your car.  Throw away smoking accessories, such as Scientist, research (medical).  Clean your car. Make sure to empty the ashtray.  Clean your home, including curtains and carpets. What strategies can I use to quit smoking? Talk with your health care provider about combining strategies, such as taking medicines while you are also receiving in-person counseling. Using these two strategies together makes you more likely to succeed in quitting than if you used either strategy on its own.  If you are pregnant or  breastfeeding, talk with your health care provider about finding counseling or other support strategies to quit smoking. Do not take medicine to help you quit smoking unless your health care provider tells you to do so. To quit smoking: Quit right away  Quit smoking completely, instead of gradually reducing how much you smoke over a period of time. Research shows that stopping smoking right away is more successful than gradually quitting.  Attend in-person counseling to help you build problem-solving skills. You are more likely to succeed in quitting if you attend counseling sessions regularly. Even short sessions of 10 minutes can be effective. Take medicine You may take medicines to help you quit smoking. Some medicines require a prescription and some you can purchase over-the-counter. Medicines may have nicotine in them to replace the nicotine in cigarettes. Medicines may:  Help to stop cravings.  Help to relieve withdrawal symptoms. Your health care provider may recommend:  Nicotine patches, gum, or lozenges.  Nicotine inhalers or sprays.  Non-nicotine medicine that is taken by mouth. Find resources Find resources and support systems that can help you to quit smoking and remain smoke-free after you quit. These resources are most helpful when you use them often. They include:  Online chats with a Social worker.  Telephone quitlines.  Printed Furniture conservator/restorer.  Support groups or group counseling.  Text messaging programs.  Mobile phone apps or applications. Use apps that can help you stick to your  quit plan by providing reminders, tips, and encouragement. There are many free apps for mobile devices as well as websites. Examples include Quit Guide from the [employer] and smokefree.gov What things can I do to make it easier to quit?   Reach out to your family and friends for support and encouragement. Call telephone quitlines (1-800-QUIT-NOW), reach out to support groups, or work with a  counselor for support.  Ask people who smoke to avoid smoking around you.  Avoid places that trigger you to smoke, such as bars, parties, or smoke-break areas at work.  Spend time with people who do not smoke.  Lessen the stress in your life. Stress can be a smoking trigger for some people. To lessen stress, try: ? Exercising regularly. ? Doing deep-breathing exercises. ? Doing yoga. ? Meditating. ? Performing a body scan. This involves closing your eyes, scanning your body from head to toe, and noticing which parts of your body are particularly tense. Try to relax the muscles in those areas. How will I feel when I quit smoking? Day 1 to 3 weeks Within the first 24 hours of quitting smoking, you may start to feel withdrawal symptoms. These symptoms are usually most noticeable 2-3 days after quitting, but they usually do not last for more than 2-3 weeks. You may experience these symptoms:  Mood swings.  Restlessness, anxiety, or irritability.  Trouble concentrating.  Dizziness.  Strong cravings for sugary foods and nicotine.  Mild weight gain.  Constipation.  Nausea.  Coughing or a sore throat.  Changes in how the medicines that you take for unrelated issues work in your body.  Depression.  Trouble sleeping (insomnia). Week 3 and afterward After the first 2-3 weeks of quitting, you may start to notice more positive results, such as:  Improved sense of smell and taste.  Decreased coughing and sore throat.  Slower heart rate.  Lower blood pressure.  Clearer skin.  The ability to breathe more easily.  Fewer sick days. Quitting smoking can be very challenging. Do not get discouraged if you are not successful the first time. Some people need to make many attempts to quit before they achieve long-term success. Do your best to stick to your quit plan, and talk with your health care provider if you have any questions or concerns. Summary  Smoking tobacco is the  leading cause of preventable death. Quitting smoking is one of the best things that you can do for your health.  When you decide to quit smoking, create a plan to help you succeed.  Quit smoking right away, not slowly over a period of time.  When you start quitting, seek help from your health care provider, family, or friends. This information is not intended to replace advice given to you by your health care provider. Make sure you discuss any questions you have with your health care provider. Document Revised: 01/31/2019 Document Reviewed: 07/27/2018 Elsevier Patient Education  [place].

## 2019-08-05 NOTE — Assessment & Plan Note (Signed)
Congratulated her on efforts she's made to cut back on smoking, encouraged continued efforts Gave handout on tips to quit

## 2019-08-05 NOTE — Assessment & Plan Note (Addendum)
Well controlled. Continue current medication regimen.  

## 2019-08-05 NOTE — Progress Notes (Signed)
  Date of Visit: 08/05/2019   SUBJECTIVE:   HPI:  Raven [name] presents today for follow up.  Smoking - has cut back to about 4 cigarettes per day. She is very proud of this. Does not want any NRT to help quit smoking.  Has colonoscopy scheduled  Hypertension - taking lisinopril-HCTZ 20-54m daily. Tolerating well.  Chronic pain - previously was on tramadol and gabapentin, but she has stopped both of these. Feels great off the medication. Pain no worse off it. She does request a letter to be excused from jury duty. Due to her pain she is not able to sit for more than 30-625ms at a time. She has to get up and move around.  Asthma - using albuterol about 3x a week. Overall doing well.  Elevated alk phos - here for recheck of alk phos and GGT today   OBJECTIVE:   BP 132/80   Pulse 93   Wt 155 lb 3.2 oz (70.4 kg)   SpO2 100%   BMI 26.64 kg/m  Gen: no acute distress, pleasant, cooperative Lungs: normal work of breathing  Neuro: alert speech normal, grossly nonfocal. Normal gait.  ASSESSMENT/PLAN:   Health maintenance:  -gave rx for Tdap -has colonoscopy scheduled next month  -Got first dose of COVID vaccine, has second dose scheduled.  Asthma Stable. Continue albuterol. Encouraged continued smoking cessation.  Encounter for chronic pain management Now off all pain medications except tylenol/ibuprofen. Congratulated her on this. Wrote letter excusing her from jury duty in light of her inability to sit still for longer than 30-60 min.  Tobacco abuse counseling Congratulated her on efforts she's made to cut back on smoking, encouraged continued efforts Gave handout on tips to quit  HYPERTENSION, BENIGN SYSTEMIC Well controlled. Continue current medication regimen.   Elevated alk phos - repeat CMET today along with GGT  FOLLOW UP: Follow up in 6 mos for chronic medical issues, sooner if needed  [name]. [name] MemsMDSouth [place]

## 2019-08-05 NOTE — Assessment & Plan Note (Signed)
Stable. Continue albuterol. Encouraged continued smoking cessation.

## 2019-08-05 NOTE — Assessment & Plan Note (Signed)
Now off all pain medications except tylenol/ibuprofen. Congratulated her on this. Wrote letter excusing her from jury duty in light of her inability to sit still for longer than 30-60 min.

## 2019-08-06 ENCOUNTER — Other Ambulatory Visit: Payer: Self-pay | Admitting: Family Medicine

## 2019-08-06 LAB — CMP14+EGFR
ALT: 32 IU/L (ref 0–32)
AST: 28 IU/L (ref 0–40)
Albumin/Globulin Ratio: 1.3 (ref 1.2–2.2)
Albumin: 4.6 g/dL (ref 3.8–4.8)
Alkaline Phosphatase: 133 IU/L — ABNORMAL HIGH (ref 39–117)
BUN/Creatinine Ratio: 14 (ref 12–28)
BUN: 13 mg/dL (ref 8–27)
Bilirubin Total: 0.2 mg/dL (ref 0.0–1.2)
CO2: 21 mmol/L (ref 20–29)
Calcium: 10 mg/dL (ref 8.7–10.3)
Chloride: 101 mmol/L (ref 96–106)
Creatinine, Ser: 0.9 mg/dL (ref 0.57–1.00)
GFR calc Af Amer: 75 mL/min/{1.73_m2} (ref >59)
GFR calc non Af Amer: 65 mL/min/{1.73_m2} (ref >59)
Globulin, Total: 3.5 g/dL (ref 1.5–4.5)
Glucose: 97 mg/dL (ref 65–99)
Potassium: 5 mmol/L (ref 3.5–5.2)
Sodium: 137 mmol/L (ref 134–144)
Total Protein: 8.1 g/dL (ref 6.0–8.5)

## 2019-08-06 LAB — GAMMA GT: GGT: 83 IU/L — ABNORMAL HIGH (ref 0–60)

## 2019-08-12 ENCOUNTER — Ambulatory Visit: Payer: Medicare HMO | Attending: Internal Medicine

## 2019-08-12 DIAGNOSIS — Z23 Encounter for immunization: Secondary | ICD-10-CM

## 2019-08-12 NOTE — Progress Notes (Signed)
   Covid-19 Vaccination Clinic  Name:  CONOR CAMHI    MRN: [id number] DOB: 06/25/1950  08/12/2019  Ms. Egger was observed post Covid-19 immunization for 15 minutes without incident. She was provided with Vaccine Information Sheet and instruction to access the V-Safe system.   Ms. Mapes was instructed to call 911 with any severe reactions post vaccine: Marland Kitchen Difficulty breathing  . Swelling of face and throat  . A fast heartbeat  . A bad rash all over body  . Dizziness and weakness   Immunizations Administered    Name Date Dose VIS Date Route   Pfizer COVID-19 Vaccine 08/12/2019  1:38 PM 0.3 mL 05/02/2019 Intramuscular   Manufacturer: [place]   Lot: [id number]   [place]: [id number]

## 2019-08-13 ENCOUNTER — Telehealth: Payer: Self-pay | Admitting: Family Medicine

## 2019-08-13 DIAGNOSIS — R748 Abnormal levels of other serum enzymes: Secondary | ICD-10-CM

## 2019-08-13 NOTE — Telephone Encounter (Signed)
Called patient to discuss lab results. Alk phos and GGT remain elevated Recommend referral to hepatology but patient hesitant. She prefers to recheck in a few months and get RUQ ultrasound. This is reasonable.  Order for u/s entered. Red team, can you contact her with an appointment?  Thanks Leeanne Rio, MD

## 2019-08-14 NOTE — Telephone Encounter (Signed)
Pt informed and scheduled. Murphy Duzan, CMA  

## 2019-08-19 ENCOUNTER — Ambulatory Visit (HOSPITAL_COMMUNITY): Payer: Medicare HMO

## 2019-08-26 ENCOUNTER — Other Ambulatory Visit: Payer: Self-pay

## 2019-08-26 ENCOUNTER — Ambulatory Visit (HOSPITAL_COMMUNITY)
Admission: RE | Admit: 2019-08-26 | Discharge: 2019-08-26 | Disposition: A | Discharge status: Home / Self Care | Payer: Medicare HMO | Source: Ambulatory Visit | Attending: Family Medicine | Admitting: Family Medicine

## 2019-08-26 DIAGNOSIS — K7689 Other specified diseases of liver: Secondary | ICD-10-CM | POA: Diagnosis not present

## 2019-08-26 DIAGNOSIS — R748 Abnormal levels of other serum enzymes: Secondary | ICD-10-CM | POA: Insufficient documentation

## 2019-08-27 ENCOUNTER — Ambulatory Visit (AMBULATORY_SURGERY_CENTER): Payer: Self-pay | Admitting: *Deleted

## 2019-08-27 ENCOUNTER — Other Ambulatory Visit: Payer: Self-pay

## 2019-08-27 VITALS — Temp 97.1°F | Ht 64.0 in | Wt 156.0 lb

## 2019-08-27 DIAGNOSIS — Z1211 Encounter for screening for malignant neoplasm of colon: Secondary | ICD-10-CM

## 2019-08-27 NOTE — Progress Notes (Signed)
2nd covid vaccine on 08/12/19  No egg or soy allergy known to patient  No issues with past sedation with any surgeries  or procedures, no intubation problems  No diet pills per patient No home 02 use per patient  No blood thinners per patient  Pt denies issues with constipation  No A fib or A flutter  EMMI video sent to pt's e mail   Due to the COVID-19 pandemic we are asking patients to follow these guidelines. Please only bring one care partner. Please be aware that your care partner may wait in the car in the parking lot or if they feel like they will be too hot to wait in the car, they may wait in the lobby on the 4th floor. All care partners are required to wear a mask the entire time (we do not have any that we can provide them), they need to practice social distancing, and we will do a Covid check for all patient's and care partners when you arrive. Also we will check their temperature and your temperature. If the care partner waits in their car they need to stay in the parking lot the entire time and we will call them on their cell phone when the patient is ready for discharge so they can bring the car to the front of the building. Also all patient's will need to wear a mask into building.

## 2019-09-09 ENCOUNTER — Encounter: Payer: Self-pay | Admitting: Internal Medicine

## 2019-09-09 ENCOUNTER — Ambulatory Visit (AMBULATORY_SURGERY_CENTER): Payer: Medicare HMO | Admitting: Internal Medicine

## 2019-09-09 ENCOUNTER — Other Ambulatory Visit: Payer: Self-pay

## 2019-09-09 VITALS — BP 115/56 | HR 64 | Temp 97.1°F | Resp 12 | Ht 64.0 in | Wt 156.0 lb

## 2019-09-09 DIAGNOSIS — Z1211 Encounter for screening for malignant neoplasm of colon: Secondary | ICD-10-CM | POA: Diagnosis not present

## 2019-09-09 DIAGNOSIS — D128 Benign neoplasm of rectum: Secondary | ICD-10-CM | POA: Diagnosis not present

## 2019-09-09 DIAGNOSIS — D124 Benign neoplasm of descending colon: Secondary | ICD-10-CM | POA: Diagnosis not present

## 2019-09-09 DIAGNOSIS — K621 Rectal polyp: Secondary | ICD-10-CM | POA: Diagnosis not present

## 2019-09-09 DIAGNOSIS — D123 Benign neoplasm of transverse colon: Secondary | ICD-10-CM

## 2019-09-09 MED ORDER — SODIUM CHLORIDE 0.9 % IV SOLN: 500.0000 mL | Freq: Once | INTRAVENOUS | Status: DC

## 2019-09-09 NOTE — Progress Notes (Signed)
pt tolerated well. VSS. awake and to recovery. Report given to RN.  

## 2019-09-09 NOTE — Patient Instructions (Addendum)
I found and removed 3 tiny polyps.  You also have a condition called diverticulosis - common and not usually a problem. Please read the handout provided. There is a skin tag and it may have a wart on it - I am referring you to see if that needs to be removed [facility]).  I appreciate the opportunity to care for you. Gatha Mayer, MD, [facility]   Please read all of the handouts given to you by your recovery room nurse.  Our office will schedule an appointment with a surgeon for you. They will call you.   YOU HAD AN ENDOSCOPIC PROCEDURE TODAY AT [facility]:   Refer to the procedure report that was given to you for any specific questions about what was found during the examination.  If the procedure report does not answer your questions, please call your gastroenterologist to clarify.  If you requested that your care partner not be given the details of your procedure findings, then the procedure report has been included in a sealed envelope for you to review at your convenience later.  YOU SHOULD EXPECT: Some feelings of bloating in the abdomen. Passage of more gas than usual.  Walking can help get rid of the air that was put into your GI tract during the procedure and reduce the bloating. If you had a lower endoscopy (such as a colonoscopy or flexible sigmoidoscopy) you may notice spotting of blood in your stool or on the toilet paper. If you underwent a bowel prep for your procedure, you may not have a normal bowel movement for a few days.  Please Note:  You might notice some irritation and congestion in your nose or some drainage.  This is from the oxygen used during your procedure.  There is no need for concern and it should clear up in a day or so.  SYMPTOMS TO REPORT IMMEDIATELY:   Following lower endoscopy (colonoscopy or flexible sigmoidoscopy):  Excessive amounts of blood in the stool  Significant tenderness or worsening of abdominal pains  Swelling of the  abdomen that is new, acute  Fever of 100F or higher   For urgent or emergent issues, a gastroenterologist can be reached at any hour by calling [phone]. Do not use MyChart messaging for urgent concerns.    DIET:  We do recommend a small meal at first, but then you may proceed to your regular diet.  Drink plenty of fluids but you should avoid alcoholic beverages for 24 hours.  ACTIVITY:  You should plan to take it easy for the rest of today and you should NOT DRIVE or use heavy machinery until tomorrow (because of the sedation medicines used during the test).    FOLLOW UP: Our staff will call the number listed on your records 48-72 hours following your procedure to check on you and address any questions or concerns that you may have regarding the information given to you following your procedure. If we do not reach you, we will leave a message.  We will attempt to reach you two times.  During this call, we will ask if you have developed any symptoms of COVID 19. If you develop any symptoms (ie: fever, flu-like symptoms, shortness of breath, cough etc.) before then, please call [phone].  If you test positive for Covid 19 in the 2 weeks post procedure, please call and report this information to [place].    If any biopsies were taken you will be contacted by phone  or by letter within the next 1-3 weeks.  Please call us at [phone] if you have not heard about the biopsies in 3 weeks.    SIGNATURES/CONFIDENTIALITY: You and/or your care partner have signed paperwork which will be entered into your electronic medical record.  These signatures attest to the fact that that the information above on your After Visit Summary has been reviewed and is understood.  Full responsibility of the confidentiality of this discharge information lies with you and/or your care-partner.

## 2019-09-09 NOTE — Progress Notes (Signed)
Pt's states no medical or surgical changes since previsit or office visit.  Vitals- Francene Castle- June

## 2019-09-09 NOTE — Op Note (Signed)
[facility] Patient Name: Raven [name] Procedure Date: 09/09/2019 7:14 AM MRN: [id number] Endoscopist: Gatha Mayer , MD Age: 69 Referring MD:  Date of Birth: 1950/09/30 Gender: Female Account #: [id number] Procedure:                Colonoscopy Indications:              Screening for colorectal malignant neoplasm, This                            is the patient's first colonoscopy Medicines:                Propofol per Anesthesia, Monitored Anesthesia Care Procedure:                Pre-Anesthesia Assessment:                           - Prior to the procedure, a History and Physical                            was performed, and patient medications and                            allergies were reviewed. The patient's tolerance of                            previous anesthesia was also reviewed. The risks                            and benefits of the procedure and the sedation                            options and risks were discussed with the patient.                            All questions were answered, and informed consent                            was obtained. Prior Anticoagulants: The patient has                            taken no previous anticoagulant or antiplatelet                            agents. ASA Grade Assessment: II - A patient with                            mild systemic disease. After reviewing the risks                            and benefits, the patient was deemed in                            satisfactory condition to undergo the procedure.  After obtaining informed consent, the colonoscope                            was passed under direct vision. Throughout the                            procedure, the patient's blood pressure, pulse, and                            oxygen saturations were monitored continuously. The                            Colonoscope was introduced through the anus and   advanced to the the cecum, identified by                            appendiceal orifice and ileocecal valve. The                            colonoscopy was performed without difficulty. The                            patient tolerated the procedure well. The quality                            of the bowel preparation was excellent. The bowel                            preparation used was Miralax via split dose                            instruction. The ileocecal valve, appendiceal                            orifice, and rectum were photographed. 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                           The digital rectal exam was normal.                           Three sessile polyps were found in the rectum,                            descending colon and ascending colon. The polyps                            were diminutive in size. These polyps were removed  with a cold snare. Resection and retrieval were                            complete. Verification of patient identification                            for the specimen was done. Estimated blood loss was                            minimal.                           Multiple diverticula were found in the proximal                            transverse colon and ascending colon.                           The exam was otherwise without abnormality on                            direct and retroflexion views. Complications:            No immediate complications. Estimated Blood Loss:     Estimated blood loss was minimal. 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                           - Three diminutive  polyps in the rectum, in the                            descending colon and in the ascending colon,                            removed with a cold snare. Resected and retrieved.                           - Diverticulosis in the proximal transverse colon                            and in the ascending colon.                           - The examination was otherwise normal on direct                            and retroflexion views. Recommendation:           - Patient has a contact number available for                            emergencies. The signs and symptoms of potential  delayed complications were discussed with the                            patient. Return to normal activities tomorrow.                            Written discharge instructions were provided to the                            patient.                           - Resume previous diet.                           - Continue present medications.                           - Repeat colonoscopy is recommended. The                            colonoscopy date will be determined after pathology                            results from today's exam become available for                            review.                           - REFER TO COLORECTAL SURGERY RE: SKIN TAG W/                            SUSPECTED CONDYLOMA Gatha Mayer, MD 09/09/2019 8:39:02 AM This report has been signed electronically.

## 2019-09-09 NOTE — Progress Notes (Signed)
Called to room to assist during endoscopic procedure.  Patient ID and intended procedure confirmed with present staff. Received instructions for my participation in the procedure from the performing physician.  

## 2019-09-11 ENCOUNTER — Telehealth: Payer: Self-pay

## 2019-09-11 ENCOUNTER — Encounter: Payer: Self-pay | Admitting: Internal Medicine

## 2019-09-11 DIAGNOSIS — Z860101 Personal history of adenomatous and serrated colon polyps: Secondary | ICD-10-CM

## 2019-09-11 DIAGNOSIS — Z86010 Personal history of colonic polyps: Secondary | ICD-10-CM

## 2019-09-11 HISTORY — DX: Personal history of adenomatous and serrated colon polyps: Z86.0101

## 2019-09-11 HISTORY — DX: Personal history of colonic polyps: Z86.010

## 2019-09-11 NOTE — Telephone Encounter (Signed)
Surgical referral faxed to [facility] for consult for condyloma.  Patient will be contacted directly with appt date and time.

## 2019-09-11 NOTE — Telephone Encounter (Signed)
  Follow up Call-  Call back number 09/09/2019  Post procedure Call Back phone  # [phone]  Permission to leave phone message Yes  Some recent data might be hidden     Patient questions:  Do you have a fever, pain , or abdominal swelling? No. Pain Score  0 *  Have you tolerated food without any problems? Yes.    Have you been able to return to your normal activities? Yes.  Do you have any questions about your discharge instructions: Diet   No. Medications  No. Follow up visit  No.  Do you have questions or concerns about your Care? No.  Actions: * If pain score is 4 or above: No action needed, pain <4.  1. Have you developed a fever since your procedure? no  2.   Have you had an respiratory symptoms (SOB or cough) since your procedure? no  3.   Have you tested positive for COVID 19 since your procedure no  4.   Have you had any family members/close contacts diagnosed with the COVID 19 since your procedure?  no   If yes to any of these questions please route to Joylene John, RN and Erenest Rasher, RN

## 2019-09-17 ENCOUNTER — Other Ambulatory Visit: Payer: Self-pay | Admitting: Family Medicine

## 2019-09-26 ENCOUNTER — Telehealth: Payer: Self-pay | Admitting: Family Medicine

## 2019-09-26 DIAGNOSIS — R748 Abnormal levels of other serum enzymes: Secondary | ICD-10-CM

## 2019-09-26 DIAGNOSIS — G8929 Other chronic pain: Secondary | ICD-10-CM

## 2019-09-26 NOTE — Telephone Encounter (Signed)
Called to check in on patient and review u/s results She had her colonoscopy. Will be due for another one 2026. Being referred to surgeon about condyloma removal.  U/s of RUQ was largely unremarkable. Prominent CBD but within normal for age. Recommend she see a GI and speak with them about these results and her persistently elevated alk phos and GGT.   Referral placed. Patient agreeable.  Patient also requests referral back to Dr. Brien Few for spine injections. Referral placed.  Leeanne Rio, MD

## 2019-10-03 NOTE — Telephone Encounter (Signed)
Patient has been scheduled to see Dr. Johney Maine at [place] for 10/13/19

## 2019-10-09 ENCOUNTER — Telehealth: Payer: Self-pay | Admitting: Family Medicine

## 2019-10-09 NOTE — Telephone Encounter (Signed)
Patient calling for a referral to get the injections in her back. [employer] will not pay unless she has these a referral for it.

## 2019-10-09 NOTE — Telephone Encounter (Signed)
I placed a referral for this on 5/10 and it looks like it was sent to [facility]. Kennyth Lose can you check into it? Thanks Leeanne Rio, MD

## 2019-10-13 ENCOUNTER — Ambulatory Visit (HOSPITAL_COMMUNITY): Payer: Self-pay | Admitting: Surgery

## 2019-10-13 DIAGNOSIS — K641 Second degree hemorrhoids: Secondary | ICD-10-CM | POA: Diagnosis not present

## 2019-10-13 DIAGNOSIS — K6289 Other specified diseases of anus and rectum: Secondary | ICD-10-CM | POA: Diagnosis not present
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
## 2019-10-22 DIAGNOSIS — M48061 Spinal stenosis, lumbar region without neurogenic claudication: Secondary | ICD-10-CM | POA: Diagnosis not present

## 2019-10-22 DIAGNOSIS — M5416 Radiculopathy, lumbar region: Secondary | ICD-10-CM | POA: Diagnosis not present

## 2019-10-31 ENCOUNTER — Ambulatory Visit (INDEPENDENT_AMBULATORY_CARE_PROVIDER_SITE_OTHER): Payer: Medicare HMO | Admitting: Internal Medicine

## 2019-10-31 ENCOUNTER — Encounter: Payer: Self-pay | Admitting: Internal Medicine

## 2019-10-31 ENCOUNTER — Other Ambulatory Visit (INDEPENDENT_AMBULATORY_CARE_PROVIDER_SITE_OTHER): Payer: Medicare HMO

## 2019-10-31 VITALS — BP 118/60 | HR 81 | Ht 64.0 in | Wt 151.1 lb

## 2019-10-31 DIAGNOSIS — R748 Abnormal levels of other serum enzymes: Secondary | ICD-10-CM

## 2019-10-31 DIAGNOSIS — K838 Other specified diseases of biliary tract: Secondary | ICD-10-CM | POA: Diagnosis not present

## 2019-10-31 LAB — HEPATIC FUNCTION PANEL
ALT: 23 U/L (ref 0–35)
AST: 23 U/L (ref 0–37)
Albumin: 4.8 g/dL (ref 3.5–5.2)
Alkaline Phosphatase: 106 U/L (ref 39–117)
Bilirubin, Direct: 0.1 mg/dL (ref 0.0–0.3)
Total Bilirubin: 0.3 mg/dL (ref 0.2–1.2)
Total Protein: 8.7 g/dL — ABNORMAL HIGH (ref 6.0–8.3)

## 2019-10-31 NOTE — Patient Instructions (Signed)
  Your provider has requested that you go to the basement level for lab work before leaving today. Press "B" on the elevator. The lab is located at the first door on the left as you exit the elevator.    I appreciate the opportunity to care for you. Carl Gessner, MD, FACG 

## 2019-10-31 NOTE — Progress Notes (Signed)
Raven [name] 69 y.o. 1950-09-19 [id number]  Assessment & Plan:   Encounter Diagnoses  Name Primary?  . Abnormal alkaline phosphatase test Yes  . Dilated bile duct ? (81mm)     Mild elevation of alkaline phosphatase with elevated GGT.  So this tracks to the bile duct most likely.  Questionable dilation of the bile duct.  I am going to check mitochondrial antibodies repeat her LFTs.  If the mitochondrial antibodies are positive that would indicate possibility of primary biliary cholangitis.  If negative I think we need to consider an MRCP versus repeating an ultrasound versus perhaps even an EUS which might be more sensitive.  Given the MRI is noninvasive probably would go there first.  I appreciate the opportunity to care for this patient.  CC: Raven Rio, MD   Subjective:   Chief Complaint: Abnormal alkaline phosphatase  HPI Raven [name] is here for evaluation of an abnormal alkaline phosphatase has been up and down over the years.  Dr. Ardelia [name] ordered an ultrasound this spring that demonstrated a 7 mm borderline common bile duct though the head of the pancreas area was not well evaluated, she had normal echogenicity of the liver and a known hemangioma from prior CT and lumbar spine MRI.  We reviewed her images in person today.  She feels well without sicca syndrome significant fatigue dry eyes.  Hepatitis C screening antibody was negative in 2016.  Recent colonoscopy with some polyps, she is going to have what is likely an anal skin tag with hemorrhoid complex as of Raven [name] less likely a condyloma resected by Dr. Johney [name] later this summer. Allergies  Allergen Reactions  . Procaine Hcl Hives and Rash    Novacaine  . Tomato Rash   Current Meds  Medication Sig  . acetaminophen (TYLENOL) 500 MG tablet Take 500 mg by mouth every 6 (six) hours as needed.  Marland Kitchen albuterol (PROVENTIL) (2.5 MG/3ML) 0.083% nebulizer solution INHALE THE CONTENTS OF 1 VIAL VIA NEBULIZER EVERY 6 HOURS AS  NEEDED FOR SHORTNESS OF BREATH  OR  WHEEZING  . albuterol (VENTOLIN HFA) 108 (90 Base) MCG/ACT inhaler INHALE 2 PUFFS INTO THE LUNGS EVERY 6 (SIX) HOURS AS NEEDED FOR WHEEZING OR SHORTNESS OF BREATH.  Marland Kitchen atorvastatin (LIPITOR) 40 MG tablet TAKE 1 TABLET EVERY DAY  . lisinopril-hydrochlorothiazide (ZESTORETIC) 20-25 MG tablet TAKE 1/2 TABLET EVERY DAY  . Multiple Vitamin (MULTIVITAMIN WITH MINERALS) TABS tablet Take 1 tablet by mouth daily.  . polyethylene glycol powder (GLYCOLAX/MIRALAX) powder Take 17 g by mouth 2 (two) times daily as needed for mild constipation.   Past Medical History:  Diagnosis Date  . Asthma   . Chronic back pain   . Chronic leg pain    left  . Generalized headaches    ocasional, she uses naproxen.  Marland Kitchen GERD (gastroesophageal reflux disease)   . Hx of adenomatous colonic polyps 09/11/2019  . Hyperlipidemia   . Hypertension   . Left lumbar radiculopathy   . Sciatica of left side   . Spinal stenosis of lumbar region   . Tobacco abuse    Past Surgical History:  Procedure Laterality Date  . CESAREAN SECTION     x2  . COLONOSCOPY  2021   Adenomas  . TRACHEOSTOMY     when she was 20   Social History   Social History Narrative   She is married, occasional alcohol, no drug use, she does smoke cigarettes.   Has at least 1 daughter   family history  includes Colon polyps in her maternal uncle.   Review of Systems As per HPI Objective:   Physical Exam BP 118/60   Pulse 81   Ht 5\' 4"  (1.626 m)   Wt 151 lb 2 oz (68.5 kg)   BMI 25.94 kg/m  No acute distress

## 2019-11-01 ENCOUNTER — Other Ambulatory Visit (HOSPITAL_COMMUNITY): Payer: Medicare HMO

## 2019-11-04 LAB — MITOCHONDRIAL ANTIBODIES: Mitochondrial M2 Ab, IgG: <=20 U

## 2019-11-05 ENCOUNTER — Other Ambulatory Visit: Payer: Self-pay | Admitting: Internal Medicine

## 2019-11-05 DIAGNOSIS — R748 Abnormal levels of other serum enzymes: Secondary | ICD-10-CM

## 2019-11-05 NOTE — Progress Notes (Signed)
I called her - LFT and mitochondrial Ab's are NL  Plan : recheck LFT 9/13 she knows to come  Could need other imaging but CBD was only 7 mm and LFT back to NL so not now

## 2019-11-23 IMAGING — CT CT ABD-PELV W/ CM
2 of 5 series · 16 of 46 positions shown, 18 images · IV contrast (APPLIED)
Comparison: CT scan 06/18/2014

CLINICAL DATA: Abdominal pain, nausea, vomiting and diarrhea for 3
days.

EXAM:
CT ABDOMEN AND PELVIS WITH CONTRAST
TECHNIQUE: Multidetector CT imaging of the abdomen and pelvis was performed
using the standard protocol following bolus administration of
intravenous contrast.
CONTRAST:  100mL OMNIPAQUE IOHEXOL 300 MG/ML  SOLN

[Series 3: abd/ pelvis 5.0 i30f 2 · axial · 0.83mm/px · z∈[+779,+1189]mm · 13 of 92 slices shown, 15 images]
[im 5/92  soft-tissue]
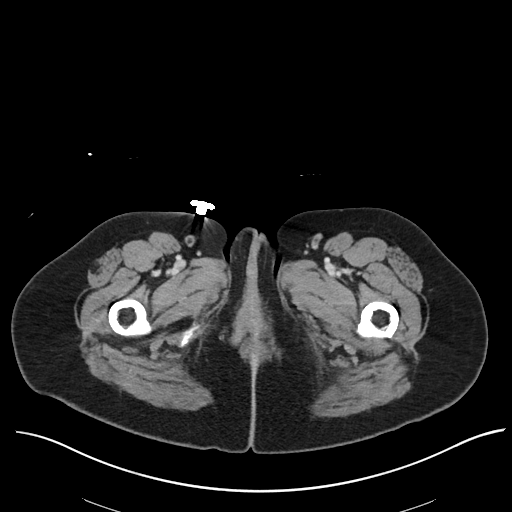
[im 5/92  bone]
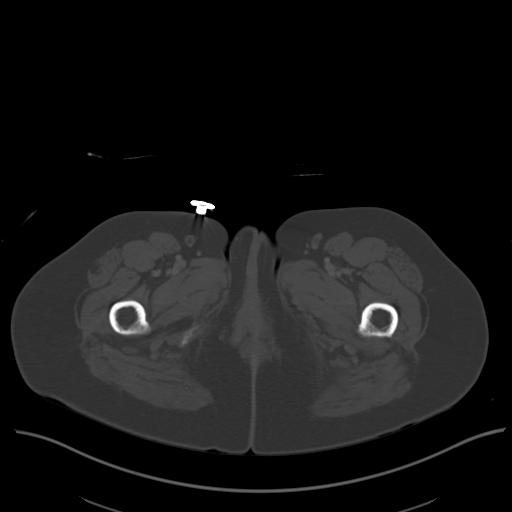
[im 15/92  soft-tissue]
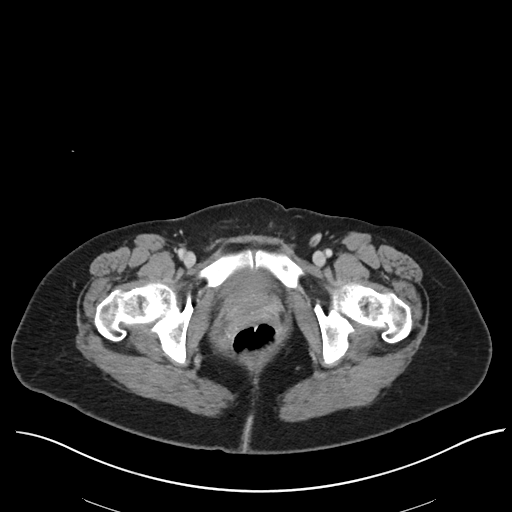
[im 20/92  soft-tissue]
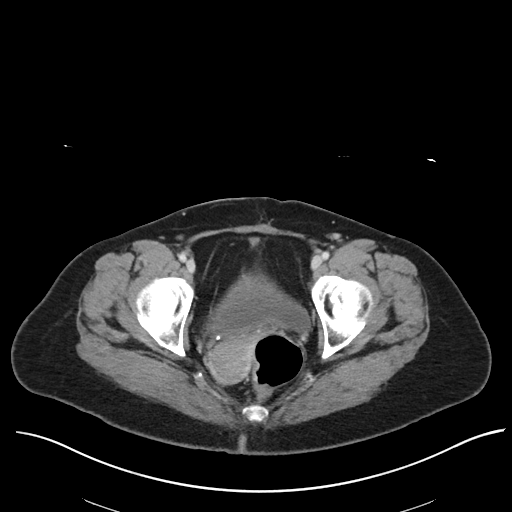
[im 24/92  soft-tissue]
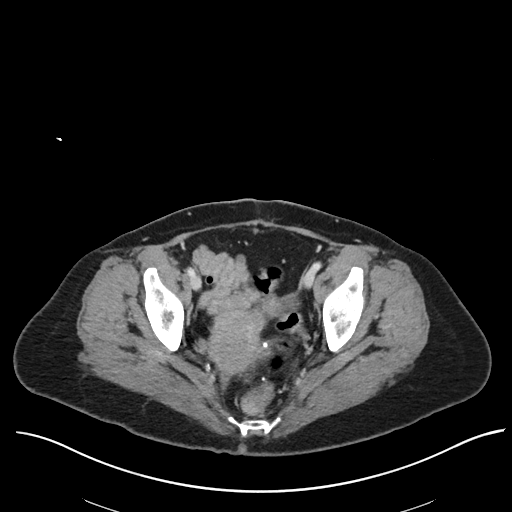
[im 34/92  soft-tissue]
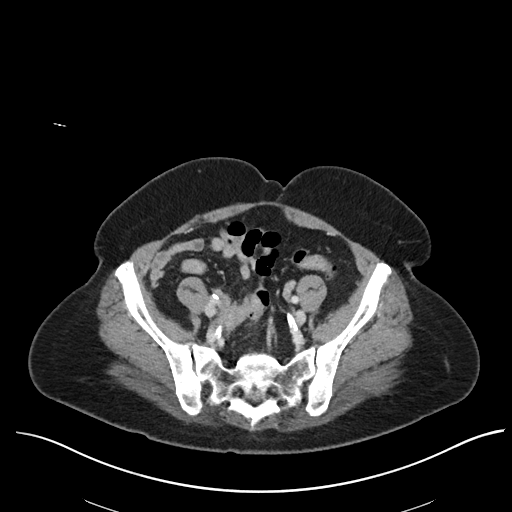
[im 39/92  soft-tissue]
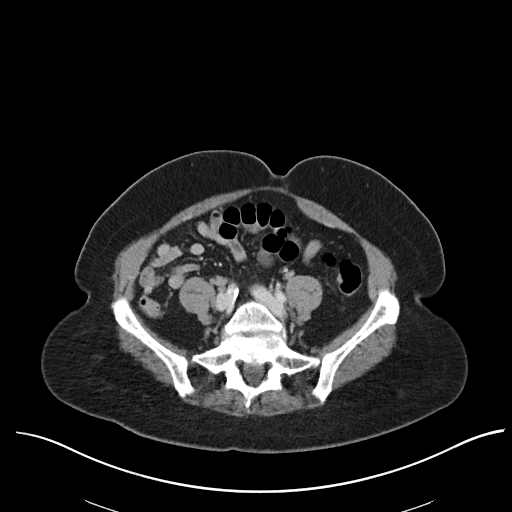
[im 48/92  soft-tissue]
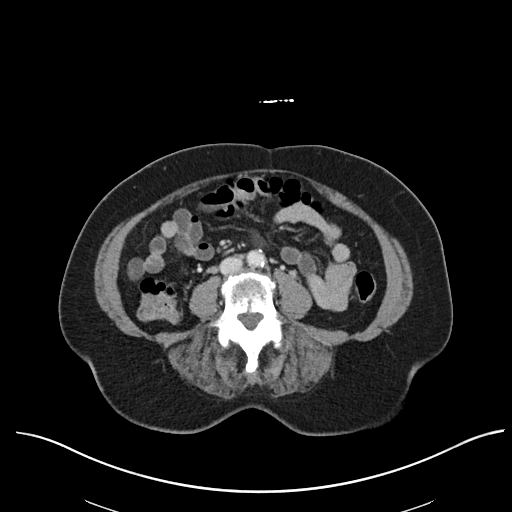
[im 53/92  soft-tissue]
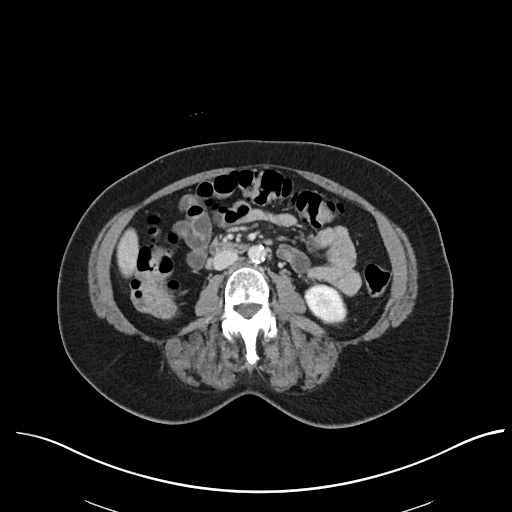
[im 58/92  soft-tissue]
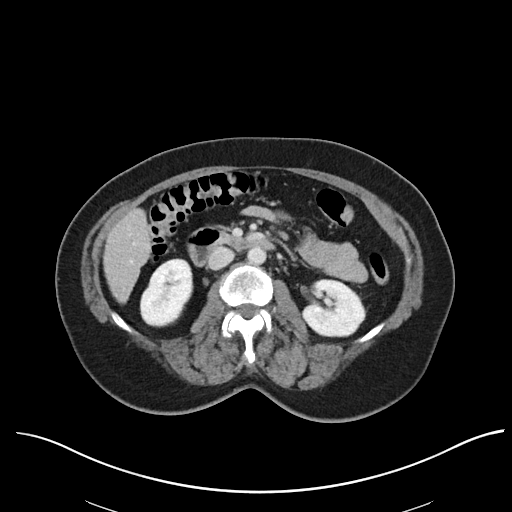
[im 58/92  bone]
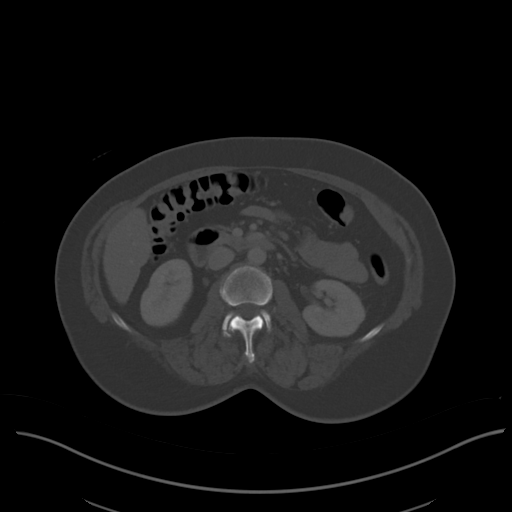
[im 68/92  soft-tissue]
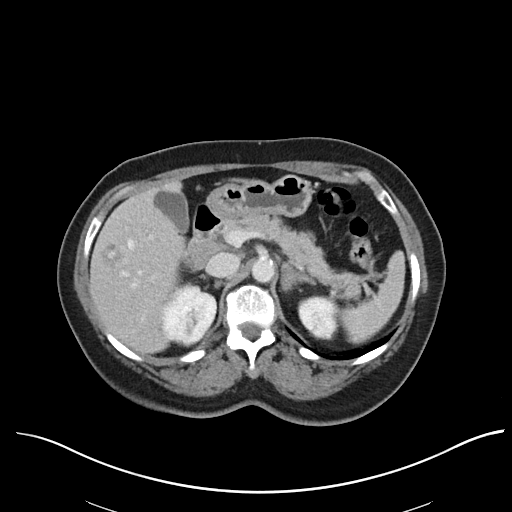
[im 72/92  soft-tissue]
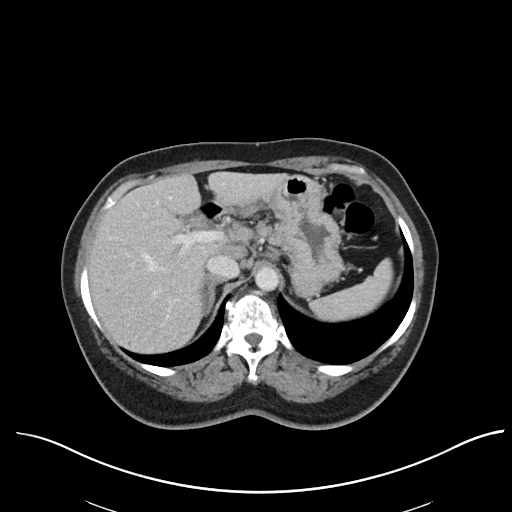
[im 77/92  soft-tissue]
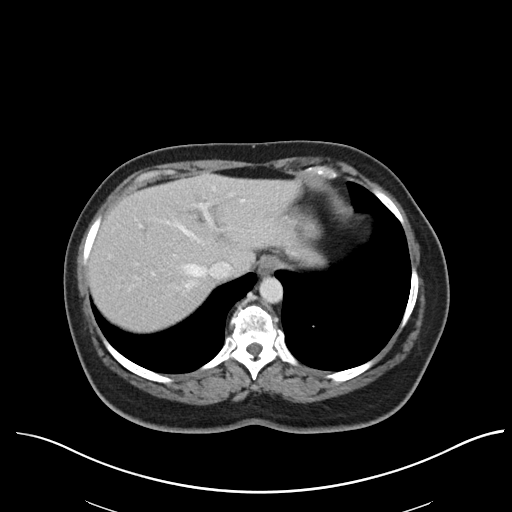
[im 87/92  soft-tissue]
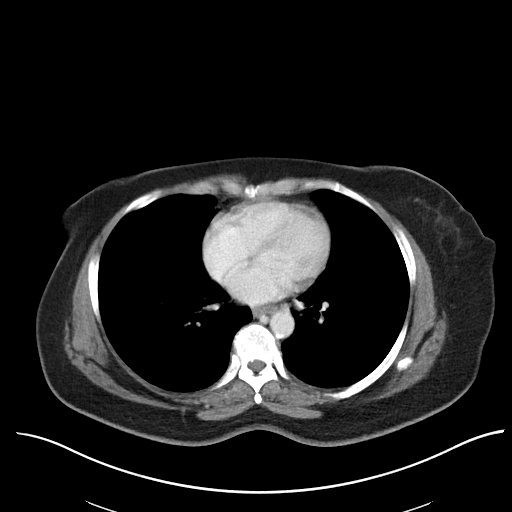

[Series 6: coronal soft tissue · coronal · 0.83mm/px · 3 of 111 slices shown]
[im 37/111  soft-tissue]
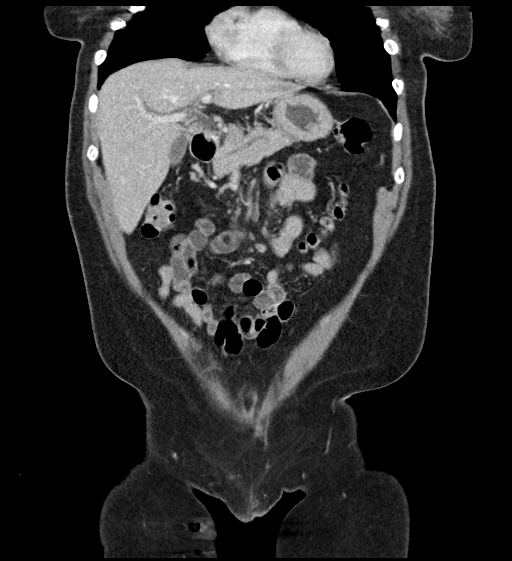
[im 49/111  soft-tissue]
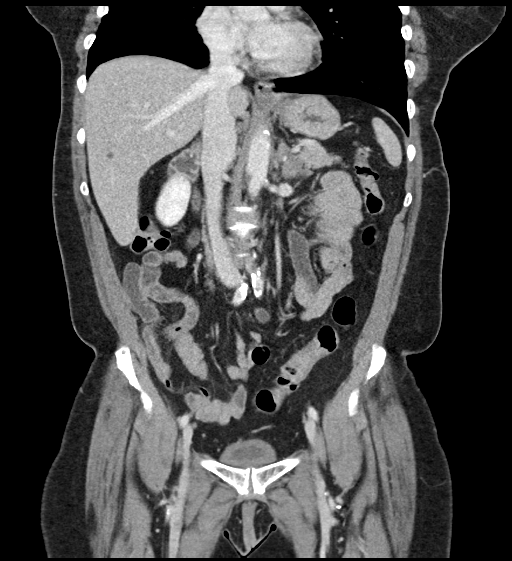
[im 62/111  soft-tissue]
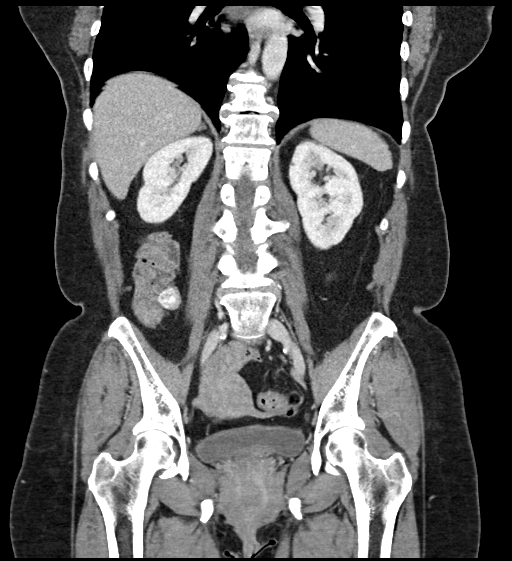

[16 of 46 positions shown; findings below may reference images not displayed]

FINDINGS: Lower chest: The lung bases are clear of acute process. No pleural
effusion or pulmonary lesions. The heart is normal in size. No
pericardial effusion. The distal esophagus and aorta are
unremarkable.

Hepatobiliary: The large left hepatic lobe cyst seen on prior
studies has resolved. It may have ruptured or involuted.

Segment 5 lesion appears stable going back to a lumbar spine MRI
from 2712 and is most likely a benign hepatic hemangioma.

Small segment 6 cyst is noted.  No worrisome hepatic lesions.

The portal and hepatic veins are patent.

The gallbladder is normal.  No common bile duct dilatation.

Pancreas: No mass, inflammation or ductal dilatation.

Spleen: Normal size.  No focal lesions.

Adrenals/Urinary Tract: Stable enlarged left adrenal gland likely
adrenal gland hyperplasia. The right adrenal gland is normal.

No worrisome renal lesions, renal calculi or hydronephrosis. The
bladder is unremarkable. A urachal remnant is noted.

Stomach/Bowel: The stomach, duodenum, small bowel and colon are
unremarkable. No acute inflammatory changes, mass lesions or
obstructive findings. The terminal ileum is normal. The appendix is
normal. Stable giant diverticuli involving the cecum.

Vascular/Lymphatic: Advanced atherosclerotic calcifications
involving the distal aorta and iliac arteries. No aneurysm or
dissection. The branch vessels are patent. The major venous
structures are patent. No mesenteric or retroperitoneal mass or
adenopathy. Small scattered lymph nodes are stable.

Reproductive: The uterus and ovaries are unremarkable.

Other: No pelvic mass or adenopathy. No free pelvic fluid
collections. No inguinal mass or adenopathy. No abdominal wall
hernia or subcutaneous lesions.

Musculoskeletal: No significant bony findings. Advanced facet
disease noted in the lower lumbar spine.
IMPRESSION: 1. No acute abdominal/pelvic findings, mass lesions or adenopathy.
2. Stable right hepatic lobe lesions. The large left hepatic lobe
cyst has resolved.
3. Age advanced distal aortic and iliac artery calcifications but no
aneurysm.
4. Scattered colonic diverticulosis but no findings for acute
diverticulitis.

## 2019-12-02 ENCOUNTER — Encounter: Payer: Self-pay | Admitting: Family Medicine

## 2019-12-02 ENCOUNTER — Other Ambulatory Visit: Payer: Self-pay

## 2019-12-02 ENCOUNTER — Ambulatory Visit (INDEPENDENT_AMBULATORY_CARE_PROVIDER_SITE_OTHER): Payer: Medicare HMO | Admitting: Family Medicine

## 2019-12-02 VITALS — BP 122/75 | HR 75 | Ht 64.0 in | Wt 155.0 lb

## 2019-12-02 DIAGNOSIS — M549 Dorsalgia, unspecified: Secondary | ICD-10-CM | POA: Diagnosis not present

## 2019-12-02 DIAGNOSIS — K0889 Other specified disorders of teeth and supporting structures: Secondary | ICD-10-CM

## 2019-12-02 DIAGNOSIS — G8929 Other chronic pain: Secondary | ICD-10-CM

## 2019-12-02 DIAGNOSIS — Z716 Tobacco abuse counseling: Secondary | ICD-10-CM

## 2019-12-02 MED ORDER — IBUPROFEN 800 MG PO TABS: 800.0000 mg | ORAL_TABLET | Freq: Three times a day (TID) | ORAL | 0 refills | Status: DC | PRN

## 2019-12-02 MED ORDER — NICOTINE POLACRILEX 2 MG MT GUM: 2.0000 mg | CHEWING_GUM | OROMUCOSAL | 1 refills | Status: DC | PRN

## 2019-12-02 NOTE — Assessment & Plan Note (Signed)
Referral back to neurosurgery so she can continue to get her usual back injections

## 2019-12-02 NOTE — Assessment & Plan Note (Signed)
Refill ibuprofen 800mg  tablets for her today Follow up with dentist once insurance kicks in

## 2019-12-02 NOTE — Progress Notes (Signed)
  Date of Visit: 12/02/2019   SUBJECTIVE:   HPI:  Raven [name] presents today for routine follow up.  Tooth pain - requests rx for ibuprofen sent in. Just got dental insurance and will be able to see dentist after August 1.  Needs referral entered to neurosurgery for her back injections (for insurance purposes).  Has surgery scheduled for removal of perianal skin tag/hemorrhoid. There is another area more anteriorly that she wants removed as well.  Smokes about 4-5 cigarettes per day (Kool 100 brand). Would be willing to try nicotine gum.  Has been seeing GI for elevated alk phos and is frustrated with having repeated doctor visits and lab draws.  OBJECTIVE:   BP 122/75   Pulse 75   Ht '5\' 4"'$  (1.626 m)   Wt 155 lb (70.3 kg)   SpO2 98%   BMI 26.61 kg/m  Gen: no acute distress, pleasant, cooperative HEENT: normocephalic, atraumatic  Heart: regular rate and rhythm  Lungs: clear to auscultation bilaterally, normal work of breathing  Neuro: speech normal, grossly nonfocal GU: external genitalia shows prominent L labia minora which is the area patient identified as the secondary area she wanted removed. Pedunculated perianal skin tag present with lighter discoloration.  ASSESSMENT/PLAN:   Health maintenance:  -UTD except for Tdap, plans to get this in a few months   Encounter for chronic pain management Referral back to neurosurgery so she can continue to get her usual back injections  Tooth pain Refill ibuprofen '800mg'$  tablets for her today Follow up with dentist once insurance kicks in  Tobacco abuse counseling Counseled on importance of quitting rx nicotine gum for patient sent in Follow up in 3 months    Examined vulva with chaperone Shelly present - area she identified as a "second skin tag" is actually her labia minora. This area is not bothering her. Advised she does not need it removed.  FOLLOW UP: Follow up in 3 mos for above issues  [place] J. [name], [place]

## 2019-12-02 NOTE — Progress Notes (Signed)
error 

## 2019-12-02 NOTE — Assessment & Plan Note (Signed)
Counseled on importance of quitting rx nicotine gum for patient sent in Follow up in 3 months

## 2019-12-02 NOTE — Patient Instructions (Addendum)
Use nicotine chewing gum - sent to your pharmacy  Refilled ibuprofen  Referred for back injections  Follow up with me in 3 months, sooner if needed  Be well, Dr. [name]     Nicotine chewing gum What is this medicine? NICOTINE ([place] teen) helps people stop smoking. This medicine replaces the nicotine found in cigarettes and helps to decrease withdrawal effects. It is most effective when used in combination with a stop-smoking program. This medicine may be used for other purposes; ask your health care provider or pharmacist if you have questions. COMMON BRAND NAME(S): NICOrelief, Nicorette  How should I use this medicine? Chew but do not swallow the gum. Follow the directions that come with the chewing gum. Use exactly as directed. When you feel an urgent desire for a cigarette, chew one piece of gum slowly. Continue chewing until you taste the gum or feel a slight tingling in your mouth. Then, stop chewing and place the gum between your cheek and gum. Wait until the taste or tingling is almost gone then start chewing again. Continue chewing in this manner for about 30 minutes. Slow chewing helps reduce cravings and also helps reduce the chance for heartburn or other gastrointestinal side effects. Talk to your pediatrician regarding the use of this medicine in children. Special care may be needed. Overdosage: If you think you have taken too much of this medicine contact a poison control center or emergency room at once. NOTE: This medicine is only for you. Do not share this medicine with others.  What if I miss a dose? This does not apply. Only use the chewing gum when you have a strong desire to smoke. Do not use more than one piece of gum at a time.  What should I watch for while using this medicine? Always carry the nicotine gum with you. Do not use more than 30 pieces of gum a day. Too much gum can increase the risk of an overdose. As the urge to smoke gets less, gradually reduce  the number of pieces each day over a period of 2 to 3 months. When you are only using 1 or 2 pieces a day, stop using the nicotine gum. You should begin using the nicotine gum the day you stop smoking. It is okay if you do not succeed with the attempt to quit and have a cigarette. You can still continue your quit attempt and keep using the product as directed. Just throw away your cigarettes and get back to your quit plan. If your mouth gets sore from chewing the gum, suck hard sugarless candy between pieces of gum to help relieve the soreness. Brush your teeth regularly to reduce mouth irritation. If you wear dentures, contact your doctor or health care professional if the gum sticks to your dental work. If you are a diabetic and you quit smoking, the effects of insulin may be increased and you may need to reduce your insulin dose. Check with your doctor or health care professional about how you should adjust your insulin dose.  Where should I keep my medicine? Keep out of the reach of children. Store at room temperature between 15 and 30 degrees C (59 and 86 degrees F). Protect from heat and light. Throw away unused medicine after the expiration date.  NOTE: This sheet is a summary. It may not cover all possible information. If you have questions about this medicine, talk to your doctor, pharmacist, or health care provider.

## 2019-12-10 ENCOUNTER — Other Ambulatory Visit: Payer: Self-pay | Admitting: Family Medicine

## 2019-12-15 ENCOUNTER — Other Ambulatory Visit: Payer: Self-pay | Admitting: Family Medicine

## 2019-12-17 NOTE — Progress Notes (Signed)
LVM to inform the pt of the Covid testing site change of location to [place] on 12/22/19. Please show up at the scheduled time, and this new site is still a drive- thru. Please contact your ordering provider with any other questions/concerns.

## 2019-12-24 NOTE — Progress Notes (Signed)
Spoke with wendy at [facility] and told wendy no vm, no answer, no other numbers for pt, wendy to call pt

## 2019-12-25 NOTE — Progress Notes (Signed)
Spoke with pt to cancel no tranportation, pt spoke with wendy

## 2019-12-26 ENCOUNTER — Other Ambulatory Visit: Payer: Self-pay | Admitting: Family Medicine

## 2019-12-29 ENCOUNTER — Inpatient Hospital Stay (HOSPITAL_COMMUNITY)
Admission: RE | Admit: 2019-12-29 | Discharge: 2019-12-29 | Disposition: A | Discharge status: Home / Self Care | Payer: Medicare HMO | Source: Ambulatory Visit

## 2020-01-01 ENCOUNTER — Ambulatory Visit (HOSPITAL_BASED_OUTPATIENT_CLINIC_OR_DEPARTMENT_OTHER): Admit: 2020-01-01 | Payer: Medicare HMO | Admitting: Surgery

## 2020-01-01 ENCOUNTER — Encounter (HOSPITAL_BASED_OUTPATIENT_CLINIC_OR_DEPARTMENT_OTHER): Payer: Self-pay

## 2020-01-01 SURGERY — EXCISION MASS
Anesthesia: General

## 2020-01-06 ENCOUNTER — Other Ambulatory Visit: Payer: Self-pay | Admitting: Family Medicine

## 2020-01-12 ENCOUNTER — Other Ambulatory Visit: Payer: Self-pay | Admitting: Family Medicine

## 2020-01-19 ENCOUNTER — Other Ambulatory Visit: Payer: Self-pay | Admitting: Family Medicine

## 2020-02-02 ENCOUNTER — Other Ambulatory Visit (INDEPENDENT_AMBULATORY_CARE_PROVIDER_SITE_OTHER): Payer: Medicare HMO

## 2020-02-02 DIAGNOSIS — R748 Abnormal levels of other serum enzymes: Secondary | ICD-10-CM

## 2020-02-02 LAB — HEPATIC FUNCTION PANEL
ALT: 23 U/L (ref 0–35)
AST: 28 U/L (ref 0–37)
Albumin: 4.8 g/dL (ref 3.5–5.2)
Alkaline Phosphatase: 98 U/L (ref 39–117)
Bilirubin, Direct: 0.1 mg/dL (ref 0.0–0.3)
Total Bilirubin: 0.3 mg/dL (ref 0.2–1.2)
Total Protein: 8.5 g/dL — ABNORMAL HIGH (ref 6.0–8.3)

## 2020-02-04 ENCOUNTER — Other Ambulatory Visit: Payer: Self-pay

## 2020-02-04 DIAGNOSIS — R748 Abnormal levels of other serum enzymes: Secondary | ICD-10-CM

## 2020-02-05 ENCOUNTER — Ambulatory Visit (INDEPENDENT_AMBULATORY_CARE_PROVIDER_SITE_OTHER): Payer: Medicare HMO | Admitting: Family Medicine

## 2020-02-05 ENCOUNTER — Encounter: Payer: Self-pay | Admitting: Family Medicine

## 2020-02-05 ENCOUNTER — Other Ambulatory Visit: Payer: Self-pay

## 2020-02-05 VITALS — BP 124/72 | HR 87 | Ht 64.0 in | Wt 152.6 lb

## 2020-02-05 DIAGNOSIS — Z716 Tobacco abuse counseling: Secondary | ICD-10-CM | POA: Diagnosis not present

## 2020-02-05 DIAGNOSIS — G8929 Other chronic pain: Secondary | ICD-10-CM

## 2020-02-05 DIAGNOSIS — Z23 Encounter for immunization: Secondary | ICD-10-CM | POA: Diagnosis not present

## 2020-02-05 DIAGNOSIS — I10 Essential (primary) hypertension: Secondary | ICD-10-CM | POA: Diagnosis not present

## 2020-02-05 DIAGNOSIS — K644 Residual hemorrhoidal skin tags: Secondary | ICD-10-CM | POA: Insufficient documentation

## 2020-02-05 DIAGNOSIS — J4520 Mild intermittent asthma, uncomplicated: Secondary | ICD-10-CM | POA: Diagnosis not present

## 2020-02-05 MED ORDER — TETANUS-DIPHTH-ACELL PERTUSSIS 5-2.5-18.5 LF-MCG/0.5 IM SUSP: 0.5000 mL | Freq: Once | INTRAMUSCULAR | 0 refills | Status: AC

## 2020-02-05 NOTE — Patient Instructions (Signed)
It was great to see you again today!  Flu shot today  Go get your tetanus shot at your pharmacy  Congratulations on quitting smoking!!! I am so proud of you!!  Will check on reason for referral needs.  Follow up with me in December.  Be well, Dr. [name]

## 2020-02-05 NOTE — Assessment & Plan Note (Signed)
Using albuterol just occasionally, about twice a month

## 2020-02-05 NOTE — Progress Notes (Signed)
  Date of Visit: 02/05/2020   SUBJECTIVE:   HPI:  Raven [name] presents today for follow up.  Skin tag removal - had surgery scheduled to have perianal skin tag/mass removed, but she did not want it done at [facility], was also upset that she had to drive to [facility] to get preoperative COVID testing so she canceled the surgery. Is contemplating rescheduling it.  Tobacco abuse - quit smoking on Monday! Is on day 4 without a cigarette. She is proud of herself. Doing well.  Back injections - needs another referral, she and I are both unsure why she keeps needing referrals to get these done.  Asthma - using albuterol just 1-2 times a month overall, doing well  Hypertension - taking 1/2 tablet of lisin-HCTZ 20-25mg  daily, tolerating well.  OBJECTIVE:   BP 124/72   Pulse 87   Ht 5\' 4"  (1.626 m)   Wt 152 lb 9.6 oz (69.2 kg)   SpO2 99%   BMI 26.19 kg/m  Gen: no acute distress, pleasant, cooperative HEENT: normocephalic, atraumatic  Heart: regular rate and rhythm, no murmur Lungs: clear to auscultation bilaterally, normal work of breathing  Neuro: alert, speech normal  ASSESSMENT/PLAN:   Health maintenance:  -flu shot today -given rx to get Tdap -will be due for mammo/DEXA in a few months, will address at follow up visit in December  Tobacco abuse counseling Congratulated patient on 4 days without a cigarette. Hopefully she can continue in this trajectory.  Asthma Using albuterol just occasionally, about twice a month  Encounter for chronic pain management Unclear why patient continues to require referrals for injections. Will touch base with referral coordinator about this. From my perspective she is fine to follow up with the back specialist as much as she and he deem necessary.  HYPERTENSION, BENIGN SYSTEMIC Well controlled. Continue current medication regimen.   Skin tag of perianal region Explained that driving to [facility] is necessary for preop COVID testing, and  would have been required even if she had her surgery done at [facility]. Patient is contemplating whether she wants to reschedule her surgery. Encouraged her to reach out to surgeon's office about rescheduling.  FOLLOW UP: Follow up in 3  mos for above issues  [place] J. [name], [place]

## 2020-02-05 NOTE — Assessment & Plan Note (Signed)
Unclear why patient continues to require referrals for injections. Will touch base with referral coordinator about this. From my perspective she is fine to follow up with the back specialist as much as she and he deem necessary.

## 2020-02-05 NOTE — Assessment & Plan Note (Signed)
Congratulated patient on 4 days without a cigarette. Hopefully she can continue in this trajectory.

## 2020-02-05 NOTE — Assessment & Plan Note (Signed)
Well controlled. Continue current medication regimen.  

## 2020-02-05 NOTE — Assessment & Plan Note (Signed)
Explained that driving to [facility] is necessary for preop COVID testing, and would have been required even if she had her surgery done at [facility]. Patient is contemplating whether she wants to reschedule her surgery. Encouraged her to reach out to surgeon's office about rescheduling.

## 2020-02-13 DIAGNOSIS — Z0100 Encounter for examination of eyes and vision without abnormal findings: Secondary | ICD-10-CM | POA: Diagnosis not present

## 2020-02-13 DIAGNOSIS — H524 Presbyopia: Secondary | ICD-10-CM | POA: Diagnosis not present

## 2020-03-12 DIAGNOSIS — M48061 Spinal stenosis, lumbar region without neurogenic claudication: Secondary | ICD-10-CM | POA: Diagnosis not present

## 2020-04-13 ENCOUNTER — Other Ambulatory Visit: Payer: Self-pay | Admitting: Family Medicine

## 2020-04-13 DIAGNOSIS — Z1231 Encounter for screening mammogram for malignant neoplasm of breast: Secondary | ICD-10-CM

## 2020-04-21 ENCOUNTER — Other Ambulatory Visit: Payer: Self-pay

## 2020-04-21 ENCOUNTER — Ambulatory Visit: Payer: Medicare HMO

## 2020-04-21 ENCOUNTER — Ambulatory Visit (INDEPENDENT_AMBULATORY_CARE_PROVIDER_SITE_OTHER): Payer: Medicare HMO

## 2020-04-21 DIAGNOSIS — Z23 Encounter for immunization: Secondary | ICD-10-CM | POA: Diagnosis not present

## 2020-04-21 NOTE — Progress Notes (Signed)
   Covid-19 Vaccination Clinic  Name:  Raven [name]    MRN: [id number] DOB: Jun 18, 1950  04/21/2020  [name] was observed post Covid-19 immunization for 15 minutes without incident. She was provided with Vaccine Information Sheet and instruction to access the V-Safe system.   [name] was instructed to call 911 with any severe reactions post vaccine: Marland Kitchen Difficulty breathing  . Swelling of face and throat  . A fast heartbeat  . A bad rash all over body  . Dizziness and weakness   Booster administered RD without complication.

## 2020-04-27 ENCOUNTER — Encounter: Payer: Self-pay | Admitting: Family Medicine

## 2020-04-27 ENCOUNTER — Other Ambulatory Visit: Payer: Self-pay

## 2020-04-27 ENCOUNTER — Ambulatory Visit (INDEPENDENT_AMBULATORY_CARE_PROVIDER_SITE_OTHER): Payer: Medicare HMO | Admitting: Family Medicine

## 2020-04-27 DIAGNOSIS — G8929 Other chronic pain: Secondary | ICD-10-CM | POA: Diagnosis not present

## 2020-04-27 DIAGNOSIS — Z87891 Personal history of nicotine dependence: Secondary | ICD-10-CM | POA: Diagnosis not present

## 2020-04-27 MED ORDER — TETANUS-DIPHTH-ACELL PERTUSSIS 5-2.5-18.5 LF-MCG/0.5 IM SUSY: 0.5000 mL | PREFILLED_SYRINGE | Freq: Once | INTRAMUSCULAR | 0 refills | Status: AC

## 2020-04-27 NOTE — Patient Instructions (Addendum)
It was great to see you again today!  I am so SO proud of all the great work you've been doing.  Routine exercise is good for you. See back exercise handout below.  Go get your tetanus shot and I won't bother you about it again until you're 79!  Be well, Dr. [name]    Back Exercises The following exercises strengthen the muscles that help to support the trunk and back. They also help to keep the lower back flexible. Doing these exercises can help to prevent back pain or lessen existing pain.  If you have back pain or discomfort, try doing these exercises 2-3 times each day or as told by your health care provider.  As your pain improves, do them once each day, but increase the number of times that you repeat the steps for each exercise (do more repetitions).  To prevent the recurrence of back pain, continue to do these exercises once each day or as told by your health care provider. Do exercises exactly as told by your health care provider and adjust them as directed. It is normal to feel mild stretching, pulling, tightness, or discomfort as you do these exercises, but you should stop right away if you feel sudden pain or your pain gets worse. Exercises Single knee to chest Repeat these steps 3-5 times for each leg: 1. Lie on your back on a firm bed or the floor with your legs extended. 2. Bring one knee to your chest. Your other leg should stay extended and in contact with the floor. 3. Hold your knee in place by grabbing your knee or thigh with both hands and hold. 4. Pull on your knee until you feel a gentle stretch in your lower back or buttocks. 5. Hold the stretch for 10-30 seconds. 6. Slowly release and straighten your leg. Pelvic tilt Repeat these steps 5-10 times: 1. Lie on your back on a firm bed or the floor with your legs extended. 2. Bend your knees so they are pointing toward the ceiling and your feet are flat on the floor. 3. Tighten your lower abdominal muscles to  press your lower back against the floor. This motion will tilt your pelvis so your tailbone points up toward the ceiling instead of pointing to your feet or the floor. 4. With gentle tension and even breathing, hold this position for 5-10 seconds. Cat-cow Repeat these steps until your lower back becomes more flexible: 1. Get into a hands-and-knees position on a firm surface. Keep your hands under your shoulders, and keep your knees under your hips. You may place padding under your knees for comfort. 2. Let your head hang down toward your chest. Contract your abdominal muscles and point your tailbone toward the floor so your lower back becomes rounded like the back of a cat. 3. Hold this position for 5 seconds. 4. Slowly lift your head, let your abdominal muscles relax and point your tailbone up toward the ceiling so your back forms a sagging arch like the back of a cow. 5. Hold this position for 5 seconds.  Press-ups Repeat these steps 5-10 times: 1. Lie on your abdomen (face-down) on the floor. 2. Place your palms near your head, about shoulder-width apart. 3. Keeping your back as relaxed as possible and keeping your hips on the floor, slowly straighten your arms to raise the top half of your body and lift your shoulders. Do not use your back muscles to raise your upper torso. You may adjust the  placement of your hands to make yourself more comfortable. 4. Hold this position for 5 seconds while you keep your back relaxed. 5. Slowly return to lying flat on the floor.  Bridges Repeat these steps 10 times: 1. Lie on your back on a firm surface. 2. Bend your knees so they are pointing toward the ceiling and your feet are flat on the floor. Your arms should be flat at your sides, next to your body. 3. Tighten your buttocks muscles and lift your buttocks off the floor until your waist is at almost the same height as your knees. You should feel the muscles working in your buttocks and the back of  your thighs. If you do not feel these muscles, slide your feet 1-2 inches farther away from your buttocks. 4. Hold this position for 3-5 seconds. 5. Slowly lower your hips to the starting position, and allow your buttocks muscles to relax completely. If this exercise is too easy, try doing it with your arms crossed over your chest. Abdominal crunches Repeat these steps 5-10 times: 1. Lie on your back on a firm bed or the floor with your legs extended. 2. Bend your knees so they are pointing toward the ceiling and your feet are flat on the floor. 3. Cross your arms over your chest. 4. Tip your chin slightly toward your chest without bending your neck. 5. Tighten your abdominal muscles and slowly raise your trunk (torso) high enough to lift your shoulder blades a tiny bit off the floor. Avoid raising your torso higher than that because it can put too much stress on your low back and does not help to strengthen your abdominal muscles. 6. Slowly return to your starting position. Back lifts Repeat these steps 5-10 times: 1. Lie on your abdomen (face-down) with your arms at your sides, and rest your forehead on the floor. 2. Tighten the muscles in your legs and your buttocks. 3. Slowly lift your chest off the floor while you keep your hips pressed to the floor. Keep the back of your head in line with the curve in your back. Your eyes should be looking at the floor. 4. Hold this position for 3-5 seconds. 5. Slowly return to your starting position. Contact a health care provider if:  Your back pain or discomfort gets much worse when you do an exercise.  Your worsening back pain or discomfort does not lessen within 2 hours after you exercise. If you have any of these problems, stop doing these exercises right away. Do not do them again unless your health care provider says that you can. Get help right away if:  You develop sudden, severe back pain. If this happens, stop doing the exercises right  away. Do not do them again unless your health care provider says that you can. This information is not intended to replace advice given to you by your health care provider. Make sure you discuss any questions you have with your health care provider. Document Revised: 09/12/2018 Document Reviewed: 02/07/2018 Elsevier Patient Education  [place].

## 2020-04-27 NOTE — Progress Notes (Signed)
  Date of Visit: 04/27/2020   SUBJECTIVE:   HPI:  Raven [name] presents today for routine follow up.  Smoking history - has quit smoking since September 13! She is doing well abstaining from cigarettes, does not complain of cravings.  Back pain - continues to get steroid injections, does well with these, they help her pain. Has had a slight flare in pain recently and is interested in exercises to help. Not on any pain medication anymore.   OBJECTIVE:   BP 126/70   Pulse 93   Ht 5\' 4"  (1.626 m)   Wt 159 lb 3.2 oz (72.2 kg)   SpO2 100%   BMI 27.33 kg/m  Gen: no acute distress, pleasant, cooperative HEENT: normocephalic, atraumatic  Heart: regular rate and rhythm, no murmur Lungs: clear to auscultation bilaterally, normal work of breathing  Neuro: alert, grossly nonfocal, speech normal Back: mild tenderness of bilateral paraspinal muscles bilaterally. Gait normal.  ASSESSMENT/PLAN:   Health maintenance:  -ordered dexa, patient in process of trying to schedule this -given rx for Tdap  Encounter for chronic pain management Continues to do well with injections Mild flare of pain recently, given handout on exercises for her back  Former smoker Congratulated patient on her successful cessation of smoking  FOLLOW UP: Follow up in 3 mos for routien visit with yearly labs  [place] J. [name], [place]

## 2020-04-29 NOTE — Assessment & Plan Note (Signed)
Congratulated patient on her successful cessation of smoking

## 2020-04-29 NOTE — Assessment & Plan Note (Signed)
Continues to do well with injections Mild flare of pain recently, given handout on exercises for her back

## 2020-05-13 ENCOUNTER — Other Ambulatory Visit: Payer: Self-pay | Admitting: Family Medicine

## 2020-05-25 ENCOUNTER — Telehealth: Payer: Self-pay | Admitting: Family Medicine

## 2020-05-25 DIAGNOSIS — E2839 Other primary ovarian failure: Secondary | ICD-10-CM

## 2020-05-25 DIAGNOSIS — M8580 Other specified disorders of bone density and structure, unspecified site: Secondary | ICD-10-CM

## 2020-05-25 NOTE — Telephone Encounter (Signed)
Called and informed patient of DX order.  Patient states that she will call and make appointment.  Patient states that she has not yet gotten a TDAP.  Patient also states to give her love and regards to Dr. Pollie Meyer.  Glennie Hawk, CMA

## 2020-05-25 NOTE — Telephone Encounter (Signed)
Order entered. Please let patient know.   Also please ask if she's gotten her Tdap yet and tell her I'm proud of her for doing all these health maintenance things!  Latrelle Dodrill, MD

## 2020-05-25 NOTE — Telephone Encounter (Signed)
Patient calls requesting an order be put in to have a bone density test done at [facility]. Please advise.

## 2020-06-03 ENCOUNTER — Other Ambulatory Visit: Payer: Self-pay

## 2020-06-03 ENCOUNTER — Ambulatory Visit
Admission: RE | Admit: 2020-06-03 | Discharge: 2020-06-03 | Disposition: A | Discharge status: Home / Self Care | Payer: Medicare HMO | Source: Ambulatory Visit | Attending: Family Medicine | Admitting: Family Medicine

## 2020-06-03 DIAGNOSIS — Z1231 Encounter for screening mammogram for malignant neoplasm of breast: Secondary | ICD-10-CM

## 2020-06-10 ENCOUNTER — Encounter: Payer: Medicare HMO | Admitting: Family Medicine

## 2020-06-22 ENCOUNTER — Encounter: Payer: Self-pay | Admitting: Family Medicine

## 2020-06-22 ENCOUNTER — Ambulatory Visit (INDEPENDENT_AMBULATORY_CARE_PROVIDER_SITE_OTHER): Payer: Medicare HMO | Admitting: Family Medicine

## 2020-06-22 ENCOUNTER — Other Ambulatory Visit: Payer: Self-pay

## 2020-06-22 VITALS — BP 120/60 | HR 85 | Wt 161.4 lb

## 2020-06-22 DIAGNOSIS — J4520 Mild intermittent asthma, uncomplicated: Secondary | ICD-10-CM

## 2020-06-22 DIAGNOSIS — I10 Essential (primary) hypertension: Secondary | ICD-10-CM | POA: Diagnosis not present

## 2020-06-22 DIAGNOSIS — G8929 Other chronic pain: Secondary | ICD-10-CM

## 2020-06-22 DIAGNOSIS — E785 Hyperlipidemia, unspecified: Secondary | ICD-10-CM

## 2020-06-22 MED ORDER — TETANUS-DIPHTH-ACELL PERTUSSIS 5-2.5-18.5 LF-MCG/0.5 IM SUSY: 0.5000 mL | PREFILLED_SYRINGE | Freq: Once | INTRAMUSCULAR | 0 refills | Status: AC

## 2020-06-22 MED ORDER — BACLOFEN 10 MG PO TABS: 5.0000 mg | ORAL_TABLET | Freq: Two times a day (BID) | ORAL | 0 refills | Status: DC | PRN

## 2020-06-22 NOTE — Assessment & Plan Note (Signed)
Continue statin. Update lipids and CMEt today.

## 2020-06-22 NOTE — Progress Notes (Signed)
  Date of Visit: 06/22/2020   SUBJECTIVE:   HPI:  Kimbley presents today for routine follow up.  Asthma - has continued to abstain from cigarettes. Using inhaler about 2- 3times per week, sometimes not at all. Rarely uses nebulizer.   Hyperlipidemia - taking atorvastatin 40mg  daily, tolerating well.  Hypertension - taking lisinopril-HCTZ 20-25mg  1/2 tablet daily. Tolerating well.  Muscle pains - having pain in back and back of R thigh, feels like muscle pain/spasm. Has done exercises I gave her last time. Also taking tylenol, ibuprofen, and bengay all with some relief. Wants to hold off on steroid injection with Dr. Brien Few for a while.   OBJECTIVE:   BP 120/60   Pulse 85   Wt 161 lb 6.4 oz (73.2 kg)   SpO2 99%   BMI 27.70 kg/m  Gen: no acute distress, pleasant cooperative HEENT: normocephalic, atraumatic  Heart: regular rate and rhythm, no murmur Lungs: clear to auscultation bilaterally, normal work of breathing  Neuro: alert, speech normal, grossly nonfocal. Gait normal Back: tenderness of R gluteal muscle and back of R thigh  ASSESSMENT/PLAN:   Health maintenance:  -Tdap rx given, patient to get this today at her [place] has appointment scheduled 4/21  Encounter for chronic pain management rx for baclofen today, cautioned on risk of sedation  Asthma Well controlled. Continue current medication regimen.   HYPERTENSION, BENIGN SYSTEMIC Well controlled. Continue current medication regimen.   Hyperlipidemia Continue statin. Update lipids and CMEt today.  FOLLOW UP: Follow up in 6 mos for above issues  [place] J. [name], [place]

## 2020-06-22 NOTE — Assessment & Plan Note (Signed)
Well controlled. Continue current medication regimen.  

## 2020-06-22 NOTE — Assessment & Plan Note (Signed)
rx for baclofen today, cautioned on risk of sedation

## 2020-06-22 NOTE — Patient Instructions (Signed)
It was great to see you again today!  Sent in baclofen for your muscles Use caution as it might make you sleepy  Go get your tetanus shot  Checking labs today  Be well, Dr. [name]

## 2020-06-23 LAB — LIPID PANEL
Chol/HDL Ratio: 3.2 ratio (ref 0.0–4.4)
Cholesterol, Total: 187 mg/dL (ref 100–199)
HDL: 58 mg/dL (ref >39)
LDL Chol Calc (NIH): 107 mg/dL — ABNORMAL HIGH (ref 0–99)
Triglycerides: 125 mg/dL (ref 0–149)
VLDL Cholesterol Cal: 22 mg/dL (ref 5–40)

## 2020-06-23 LAB — CMP14+EGFR
ALT: 22 IU/L (ref 0–32)
AST: 37 IU/L (ref 0–40)
Albumin/Globulin Ratio: 1.3 (ref 1.2–2.2)
Albumin: 4.8 g/dL (ref 3.8–4.8)
Alkaline Phosphatase: 119 IU/L (ref 44–121)
BUN/Creatinine Ratio: 11 — ABNORMAL LOW (ref 12–28)
BUN: 11 mg/dL (ref 8–27)
Bilirubin Total: <0.2 mg/dL (ref 0.0–1.2)
CO2: 17 mmol/L — ABNORMAL LOW (ref 20–29)
Calcium: 9.7 mg/dL (ref 8.7–10.3)
Chloride: 102 mmol/L (ref 96–106)
Creatinine, Ser: 1 mg/dL (ref 0.57–1.00)
GFR calc Af Amer: 66 mL/min/{1.73_m2} (ref >59)
GFR calc non Af Amer: 57 mL/min/{1.73_m2} — ABNORMAL LOW (ref >59)
Globulin, Total: 3.6 g/dL (ref 1.5–4.5)
Glucose: 88 mg/dL (ref 65–99)
Potassium: 5 mmol/L (ref 3.5–5.2)
Sodium: 137 mmol/L (ref 134–144)
Total Protein: 8.4 g/dL (ref 6.0–8.5)

## 2020-06-24 ENCOUNTER — Encounter: Payer: Self-pay | Admitting: Family Medicine

## 2020-06-29 ENCOUNTER — Telehealth: Payer: Self-pay

## 2020-06-29 NOTE — Telephone Encounter (Signed)
Pt called in requested that Dr [name] ups the dosage on the last medication she prescribed. Pt states its not working, she's still in pain and would like a call [phone]

## 2020-07-02 NOTE — Telephone Encounter (Signed)
Called patient. She says baclofen 5mg  is not helping. Advised she can take a full pill (10mg ) twice daily.   She will try this. States she needs to make appointment for Tdap  Patient appreciative Leeanne Rio, MD

## 2020-07-14 ENCOUNTER — Other Ambulatory Visit: Payer: Self-pay | Admitting: Family Medicine

## 2020-07-15 ENCOUNTER — Other Ambulatory Visit: Payer: Self-pay

## 2020-07-15 ENCOUNTER — Ambulatory Visit (INDEPENDENT_AMBULATORY_CARE_PROVIDER_SITE_OTHER): Payer: Medicare HMO | Admitting: Family Medicine

## 2020-07-15 VITALS — BP 142/80 | HR 97 | Wt 160.8 lb

## 2020-07-15 DIAGNOSIS — M79604 Pain in right leg: Secondary | ICD-10-CM

## 2020-07-15 DIAGNOSIS — M79605 Pain in left leg: Secondary | ICD-10-CM

## 2020-07-15 MED ORDER — TETANUS-DIPHTH-ACELL PERTUSSIS 5-2.5-18.5 LF-MCG/0.5 IM SUSY: 0.5000 mL | PREFILLED_SYRINGE | Freq: Once | INTRAMUSCULAR | 0 refills | Status: AC

## 2020-07-15 MED ORDER — KETOROLAC TROMETHAMINE 30 MG/ML IJ SOLN
30.0000 mg | Freq: Once | INTRAMUSCULAR | Status: AC
  Administered 2020-07-15: 30 mg via INTRAMUSCULAR

## 2020-07-15 MED ORDER — TRAMADOL HCL 50 MG PO TABS: 50.0000 mg | ORAL_TABLET | Freq: Two times a day (BID) | ORAL | 0 refills | Status: DC | PRN

## 2020-07-15 NOTE — Progress Notes (Signed)
  Date of Visit: 07/15/2020   SUBJECTIVE:   HPI:  Raven [name] presents today for leg pain.  This is her typical pain, but is worse than normal lately. She has tried baclofen without relief. Pain in R lateral and posterior thigh. Also has small area of perceived swelling along R lateral thigh. Also tried bengay without relief. Denies having fever, saddle anesthesia, lower extremity weakness, or problems with stooling or urination.   OBJECTIVE:   BP (!) 146/80   Pulse 97   Wt 160 lb 12.8 oz (72.9 kg)   SpO2 99%   BMI 27.60 kg/m  Gen: no acute distress, pleasant cooperative HEENT: normocephalic, atraumatic  Neuro: alert, grossly nonfocal, speech normal Ext: R lateral thigh with small ~5cm oval shaped area of swelling that is not well defined (possibly adipose distribution asymmetry). Bilateral thighs each measure 59cm in circumference. No leg swelling, erythema, or warmth. Tenderness in deeper muscles of R posterior thigh. No leg weakness appreciated in all directions.  ASSESSMENT/PLAN:   Health maintenance:  -again recommended Tdap rx  R leg pain - low suspicion for DVT as thighs are symmetric, patient has no warmth or global swelling of the limb, no erythema. Suspect muscle strain nonresponsive to baclofen. - toradol shot today - refer to physical therapy - tramadol rx to her pharmacy - follow up if not improving  FOLLOW UP: Follow up as needed if symptoms worsen or do not improve.   [place]. [name], [place]

## 2020-07-15 NOTE — Patient Instructions (Addendum)
Toradol shot today Referring to physical therapy  Sent in tramadol to [facility]  Get tdap at your pharmacy  Let me know if you are not doing better with this regimen  Be well, Dr. [name]

## 2020-07-20 ENCOUNTER — Other Ambulatory Visit: Payer: Self-pay | Admitting: Family Medicine

## 2020-07-20 NOTE — Telephone Encounter (Signed)
Called patient to verify RX request.  There was no answer and no ability to leave a voice message.  Will try again later.  Ozella Almond, [place]

## 2020-07-20 NOTE — Telephone Encounter (Signed)
Can you guys confirm with her that she is actually requesting this refill?  Thanks Leeanne Rio, MD

## 2020-07-22 NOTE — Telephone Encounter (Signed)
Spoke to patient and she did request Tramadol.  She states that she is in a lot of pain and still needs it.  She was originally prescribed five pills and she used her last one yesterday.  Ozella Almond, [place]

## 2020-08-20 ENCOUNTER — Other Ambulatory Visit: Payer: Self-pay | Admitting: Family Medicine

## 2020-09-01 ENCOUNTER — Other Ambulatory Visit: Payer: Self-pay | Admitting: Family Medicine

## 2020-09-01 NOTE — Telephone Encounter (Signed)
Red team, Can you clarify with patient how often she is taking this medication?  Thanks! Leeanne Rio, MD

## 2020-09-01 NOTE — Telephone Encounter (Signed)
Patient states that she "takes her tramadol as she is supposed to (twice a day)."  Patient also wants a refill on her asthma pump medication.  Patient still had questions about TDAP being picked up by her insurance.  Advised patient to call insurance company to make sure so as she does not get a bill.  Ozella Almond, [place]

## 2020-09-05 MED ORDER — ALBUTEROL SULFATE HFA 108 (90 BASE) MCG/ACT IN AERS: 2.0000 | INHALATION_SPRAY | Freq: Four times a day (QID) | RESPIRATORY_TRACT | 5 refills | Status: DC | PRN

## 2020-09-07 ENCOUNTER — Ambulatory Visit: Payer: Medicare HMO | Attending: Family Medicine

## 2020-09-07 ENCOUNTER — Other Ambulatory Visit: Payer: Self-pay

## 2020-09-07 DIAGNOSIS — G8929 Other chronic pain: Secondary | ICD-10-CM | POA: Diagnosis not present

## 2020-09-07 DIAGNOSIS — M5441 Lumbago with sciatica, right side: Secondary | ICD-10-CM | POA: Diagnosis not present

## 2020-09-07 DIAGNOSIS — M5386 Other specified dorsopathies, lumbar region: Secondary | ICD-10-CM | POA: Insufficient documentation

## 2020-09-07 DIAGNOSIS — M6281 Muscle weakness (generalized): Secondary | ICD-10-CM | POA: Insufficient documentation

## 2020-09-07 DIAGNOSIS — M79604 Pain in right leg: Secondary | ICD-10-CM | POA: Insufficient documentation

## 2020-09-08 NOTE — Therapy (Signed)
[place], [place], [address] Phone: [phone]   Fax:  [phone]  Physical Therapy Evaluation  Patient Details  Name: Raven [name] MRN: [id number] Date of Birth: 04/21/1951 Referring Provider (PT): Leeanne Rio, MD   Encounter Date: 09/07/2020   PT End of Session - 09/08/20 1447    Visit Number 1    Number of Visits 13    Date for PT Re-Evaluation 10/30/20    Authorization Type HUMANA MEDICARE HMO; MEDICAID OF [place]    Authorization Time Period Re-assess FOTO on the 5th and 10th visits    Progress Note Due on Visit 10    PT Start Time 1225    PT Stop Time 1315    PT Time Calculation (min) 50 min    Activity Tolerance Patient tolerated treatment well    Behavior During Therapy [facility] for tasks assessed/performed           Past Medical History:  Diagnosis Date  . Asthma   . Chronic back pain   . Chronic leg pain    left  . Generalized headaches    ocasional, she uses naproxen.  Marland Kitchen GERD (gastroesophageal reflux disease)   . Hx of adenomatous colonic polyps 09/11/2019  . Hyperlipidemia   . Hypertension   . Left lumbar radiculopathy   . Sciatica of left side   . Spinal stenosis of lumbar region   . Tobacco abuse     Past Surgical History:  Procedure Laterality Date  . CESAREAN SECTION     x2  . COLONOSCOPY  2021   [place]den[place]  . TRACHEOSTOMY     when she was 20    There were no vitals filed for this visit.    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 Has had back issues for 8 years.    How long can you sit comfortably? no an issue    How long can you stand comfortably? 30 mins    How long can you walk comfortably? 2-3 hours in shopping if she has a cart    Diagnostic tests 05/16/2014  FINDINGS:  Examination demonstrates mild spondylosis throughout the lumbar  spine. There is no evidence of compression  fracture. There is a  subtle grade 1 anterolisthesis of L5 with respect to L4 and S1.  There is moderate facet arthropathy present. Possible disc space  narrowing at the L3-4, L4-5 and L5-S1 levels. Calcified plaque  present over the abdominal aorta and iliac arteries.     IMPRESSION:  Mild spondylosis with mild disc disease from the L3-4 level to the  L5-S1 level.     Subtle grade 1 anterolisthesis of L5 with respect L4 and S1 likely  due to the moderate facet arthropathy.    Currently in Pain? Yes    Pain Score 8    5-1-/10   Pain Location Back    Pain Orientation Right    Pain Descriptors / Indicators Aching;Sharp    Pain Type Chronic pain    Pain Onset More than a month ago    Pain Frequency Constant    Aggravating Factors  standing after sitting or lying down    Pain Relieving Factors Heating pad              [facility] PT Assessment - 09/08/20 0001      Assessment   Medical Diagnosis Right leg pain  Referring Provider (PT) Leeanne Rio, MD    Onset Date/Surgical Date --   approx 2 months ago   Hand Dominance Right    Prior Therapy No      Precautions   Precautions None      Restrictions   Weight Bearing Restrictions No      Balance Screen   Has the patient fallen in the past 6 months No      [place] residence    Living Arrangements Alone    Type of [place] to enter    Entrance Stairs-Number of Steps 1    Entrance Stairs-Rails None    Home Layout One level      Prior Function   Level of Independence Independent      Cognition   Overall Cognitive Status Within Functional Limits for tasks assessed      Observation/Other Assessments   Focus on Therapeutic Outcomes (FOTO)  41% ability      Sensation   Light Touch Appears Intact      Posture/Postural Control   Posture/Postural Control Postural limitations    Postural Limitations Rounded Shoulders;Forward head;Decreased thoracic  kyphosis;Increased thoracic kyphosis      ROM / Strength   AROM / PROM / Strength AROM;Strength      AROM   AROM Assessment Site Lumbar    Lumbar Flexion 50% limited; pulling R LB and R LE; tightness of low back    Lumbar Extension 50% limted; pain R LB and R LE    Lumbar - Right Side Bend Full; no pain    Lumbar - Left Side Bend Full; no pain    Lumbar - Right Rotation Full; no pain    Lumbar - Left Rotation Full; no pain      Strength   Overall Strength Comments LE myotomal screen negative; core weakness      Flexibility   Soft Tissue Assessment /Muscle Length yes   tight hip flexors   Hamstrings Tight bilat      Palpation   SI assessment  Compression and distraction were negative; medial malleoli and ASIS are level      Special Tests    Special Tests Lumbar    Lumbar Tests Slump Test      Slump test   Findings Negative      Transfers   Transfers Sit to Stand;Stand to Sit    Sit to Stand 7: Independent      Ambulation/Gait   Ambulation/Gait Yes    Ambulation/Gait Assistance 7: Independent                      Objective measurements completed on examination: See above findings.               PT Education - 09/07/20 1334    Education Details Eval findings, POC, HEP, sleeping positions and support for comfort, staggered standing for reduction of low back strain    [employer]) Educated Patient    Methods Explanation;Demonstration;Tactile cues;Verbal cues;Handout    Comprehension Verbalized understanding;Returned demonstration;Verbal cues required;Tactile cues required;Need further instruction            PT Short Term Goals - 09/08/20 1526      PT SHORT TERM GOAL #1   Title Pt will be Ind in an initial HEP    Status New    Target Date 09/29/20      PT SHORT TERM  GOAL #2   Title Pt will voice understanding of measures to assist in the reduction of pain    Status New    Target Date 09/29/20             PT Long Term Goals -  09/08/20 1528      PT LONG TERM GOAL #1   Title Pt will demonstrate proper understanding of body mechanics for household activities    Status New    Target Date 10/30/20      PT LONG TERM GOAL #2   Title Pt will report a decrease in low back and R LE pain with daily activities to a range of 2-6/10    Baseline 8/10    Status New    Target Date 10/30/20      PT LONG TERM GOAL #3   Title Pt FOTO score will improve to the predicted value of 59% ability    Baseline 41%    Status New    Target Date 10/30/20      PT LONG TERM GOAL #4   Title Pt will be Ind in a final HEP to maintain or improved the pt's achieved lOF    Status New    Target Date 10/30/20      PT LONG TERM GOAL #5   Title Increase pt's lumbar AROM to 25% limtation for flexion and extension as indication of decreased pain and back function    Status New    Target Date 10/30/20                  Plan - 09/07/20 1331    Clinical Impression Statement Pt presents to PT with a chronic Hx of low back and LE pain. Pt's trunk ROM was decreased for both flexion and extension at 50%. Pt' lumbar lordosis did not reverse with forward bending. B hamstrings and hip flexors are tight. Pt demonstrated a perference for flexion biased movements and following today's PT session reported a her low back and R LE pain improved from 8/10 to 5/10. A HEP was provided. Pt will benefit from PT 2w6 for lumbopelvic flexibility and strengthening, a HEP, back care education, and the use of modalities and manual techniques as indicated to decrease pain and optimize function.    Personal Factors and Comorbidities Age;Past/Current Experience;Time since onset of injury/illness/exacerbation;Comorbidity 1;Comorbidity 2;Comorbidity 3+    Comorbidities Spinal stenosis, Xray from 2015 indicating degenerative changes; high BMI    Examination-Activity Limitations Stand;Carry;Lift;Bend    Examination-Participation Restrictions Community Activity;Cleaning     Stability/Clinical Decision Making Evolving/Moderate complexity    Clinical Decision Making Moderate    Rehab Potential Good    PT Frequency 2x / week    PT Duration 6 weeks    PT Treatment/Interventions ADLs/Self Care Home Management;Electrical Stimulation;Iontophoresis 4mg /ml Dexamethasone;Moist Heat;Ultrasound;Therapeutic exercise;Therapeutic activities;Functional mobility training;Patient/family education;Manual techniques;Dry needling;Passive range of motion;Taping;Joint Manipulations    PT Next Visit Plan Assess response to HEP. Review FOTO. Progress ther ex as indicated. Use modalities and manual techniques as indicated.    PT Home Exercise Plan [email or web address]    Consulted and Agree with Plan of Care Patient           Patient will benefit from skilled therapeutic intervention in order to improve the following deficits and impairments:  Decreased range of motion,Difficulty walking,Obesity,Decreased activity tolerance,Pain,Decreased strength,Postural dysfunction  Visit Diagnosis: Pain in right leg  Chronic bilateral low back pain with right-sided sciatica  Decreased ROM of lumbar spine  Muscle weakness (generalized)  Problem List Patient Active Problem List   Diagnosis Date Noted  . Skin tag of perianal region 02/05/2020  . Abnormal alkaline phosphatase test 10/31/2019  . Dilated bile duct ? (62mm) 10/31/2019  . Hx of adenomatous colonic polyps 09/11/2019  . Herpes zoster without complication 14/43/1540  . Poor appetite 01/17/2018  . Osteopenia 04/11/2017  . Acute right-sided low back pain without sciatica   . Shortness of breath   . Obesity 05/13/2015  . 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 Vaginal cyst 12/01/2014  . Hepatic cyst 07/06/2014  . Chronic hyponatremia 07/06/2014  . Adrenal mass, left ([place]) 06/07/2014  . Encounter for chronic pain management 04/23/2014  . Headache 01/11/2014  . Carpal tunnel syndrome 10/01/2013  . Pain on swallowing 06/26/2013  .  Pre-syncope 04/16/2013  . Left leg pain 01/03/2013  . GERD (gastroesophageal reflux disease) 01/03/2013  . Left hip pain 03/26/2012  . Insomnia 08/05/2011  . Constipation due to pain medication 08/05/2011  . Healthcare maintenance 04/08/2011  . Tooth pain 01/06/2011  . SCIATICA, LEFT 03/23/2010  . Asthma 11/23/2009  . Hyperlipidemia 03/12/2008  . Former smoker 09/02/2007  . HYPERTENSION, BENIGN SYSTEMIC 07/19/2006    Gar Ponto MS, PT 09/08/20 3:41 PM  [facility] [address] [place], [place], [address] Phone: [phone]   Fax:  [phone]  Name: Raven [name] MRN: [id number] Date of Birth: Feb 09, 1951   Referring diagnosis? R leg pain Treatment diagnosis? (if different than referring diagnosis) Bilateral low back pain with R sciatica What was this (referring dx) caused by? []  Surgery []  Fall [x]  Ongoing issue [x]  Arthritis []  Other: ____________  Laterality: [x]  Rt []  Lt [x]  Both  Check all possible CPT codes:      [x]  97110 (Therapeutic Exercise)  []  92507 (SLP Treatment)  [x]  97112 (Neuro Re-ed)   []  92526 (Swallowing Treatment)   [x]  97116 (Gait Training)   []  [id number] (Cognitive Training, 1st 15 minutes) [x]  97140 (Manual Therapy)   []  97130 (Cognitive Training, each add'l 15 minutes)  [x]  97530 (Therapeutic Activities)  []  Other, List CPT Code ____________    [x]  73419 (Self Care)       []  All codes above (97110 - 97535)  [x]  97012 (Mechanical Traction)  [x]  97014 (E-stim Unattended)  [x]  97032 (E-stim manual)  [x]  97033 (Ionto)  [x]  97035 (Ultrasound)  []  97760 (Orthotic Fit) []  97750 (Physical Performance Training) []  [id number] (Aquatic Therapy) []  97034 (Contrast Bath) []  [id number] (Paraffin) []  97597 (Wound Care 1st 20 sq cm) []  97598 (Wound Care each add'l 20 sq cm) []  97016 (Vasopneumatic Device) []  [id number] (Orthotic Training) []  [id number] (Prosthetic Training)

## 2020-09-09 ENCOUNTER — Other Ambulatory Visit: Payer: Medicare HMO

## 2020-09-14 ENCOUNTER — Other Ambulatory Visit: Payer: Self-pay | Admitting: *Deleted

## 2020-09-15 MED ORDER — ALBUTEROL SULFATE HFA 108 (90 BASE) MCG/ACT IN AERS: 2.0000 | INHALATION_SPRAY | Freq: Four times a day (QID) | RESPIRATORY_TRACT | 5 refills | Status: DC | PRN

## 2020-09-16 ENCOUNTER — Ambulatory Visit: Payer: Medicare HMO | Admitting: Physical Therapy

## 2020-09-16 ENCOUNTER — Other Ambulatory Visit: Payer: Self-pay

## 2020-09-16 DIAGNOSIS — M5386 Other specified dorsopathies, lumbar region: Secondary | ICD-10-CM | POA: Diagnosis not present

## 2020-09-16 DIAGNOSIS — M6281 Muscle weakness (generalized): Secondary | ICD-10-CM

## 2020-09-16 DIAGNOSIS — M79604 Pain in right leg: Secondary | ICD-10-CM | POA: Diagnosis not present

## 2020-09-16 DIAGNOSIS — G8929 Other chronic pain: Secondary | ICD-10-CM | POA: Diagnosis not present

## 2020-09-16 DIAGNOSIS — M5441 Lumbago with sciatica, right side: Secondary | ICD-10-CM

## 2020-09-16 NOTE — Patient Instructions (Addendum)
WALKING  Walking is a great form of exercise to increase your strength, endurance and overall fitness.  A walking program can help you start slowly and gradually build endurance as you go.  Everyone's ability is different, so each person's starting point will be different.  You do not have to follow them exactly.  The are just samples. You should simply find out what's right for you and stick to that program.   In the beginning, you'll start off walking 2-3 times a day for short distances.  As you get stronger, you'll be walking further at just 1-2 times per day American Medical Association recommends 150 to300 minutes of walking  5 days a week you walk 30 to 60 min.  A. You Can Walk For A Certain Length Of Time Each Day    Walk 5 minutes 3 times per day.  Increase 2 minutes every 2 days (3 times per day).  Work up to 25-30 minutes (1-2 times per day).   Example:   Day 1-2 5 minutes 3 times per day   Day 7-8 12 minutes 2-3 times per day   Day 13-14 25 minutes 1-2 times per day  B. You Can Walk For a Certain Distance Each Day     Distance can be substituted for time.    Example:   3 trips to mailbox (at road)   3 trips to corner of block   3 trips around the block  C. Go to local high school and use the track.    Walk for distance ____ around track  Or time _15-30___ minutes  5 days a week  D. Walk __x__ Jog ____ Run ___  Please only do the exercises that your therapist has initialed and dated    Voncille Lo, PT, [facility] Certified Exercise Expert for the Aging Adult  09/16/20 10:17 AM Phone: [phone] Fax: [phone]

## 2020-09-16 NOTE — Therapy (Signed)
[place], [place], [address] Phone: [phone]   Fax:  [phone]  Physical Therapy Treatment  Patient Details  Name: Raven [name] MRN: [id number] Date of Birth: [birth year] Referring Provider (PT): Leeanne Rio, MD   Encounter Date: 09/16/2020   PT End of Session - 09/16/20 1246    Visit Number 2    Number of Visits 13    Date for PT Re-Evaluation 10/30/20    Authorization Type HUMANA MEDICARE HMO; MEDICAID OF [place]    Authorization Time Period Re-assess FOTO on the 5th and 10th visits    PT Start Time 0933    PT Stop Time 1015    PT Time Calculation (min) 42 min    Activity Tolerance Patient tolerated treatment well    Behavior During Therapy [facility] for tasks assessed/performed           Past Medical History:  Diagnosis Date  . Asthma   . Chronic back pain   . Chronic leg pain    left  . Generalized headaches    ocasional, she uses naproxen.  Marland Kitchen GERD (gastroesophageal reflux disease)   . Hx of adenomatous colonic polyps 09/11/2019  . Hyperlipidemia   . Hypertension   . Left lumbar radiculopathy   . Sciatica of left side   . Spinal stenosis of lumbar region   . Tobacco abuse     Past Surgical History:  Procedure Laterality Date  . CESAREAN SECTION     x2  . COLONOSCOPY  2021   Adenomas  . TRACHEOSTOMY     when she was 20    There were no vitals filed for this visit.   Subjective Assessment - 09/16/20 0940    Subjective I always wake up with a 9/10  but now I am a 6/10  I did not do any of the exercises    Diagnostic tests 05/16/2014  FINDINGS:  Examination demonstrates mild spondylosis throughout the lumbar  spine. There is no evidence of compression fracture. There is a  subtle grade 1 anterolisthesis of L5 with respect to L4 and S1.  There is moderate facet arthropathy present. Possible disc space  narrowing at the L3-4, L4-5 and L5-S1 levels. Calcified plaque  present over  the abdominal aorta and iliac arteries.     IMPRESSION:  Mild spondylosis with mild disc disease from the L3-4 level to the  L5-S1 level.     Subtle grade 1 anterolisthesis of L5 with respect L4 and S1 likely  due to the moderate facet arthropathy.    Currently in Pain? Yes    Pain Score 6     Pain Location Back    Pain Orientation Right    Pain Descriptors / Indicators Aching;Sharp    Pain Type Chronic pain    Pain Onset More than a month ago    Pain Frequency Constant                             [facility] Adult PT Treatment/Exercise - 09/16/20 0001      Self-Care   Self-Care Other Self-Care Comments    Other Self-Care Comments  went over FOTO report and goals and walking program 15-30 min 5 days a week and work toward 45 -60 min 5 days a week      Exercises   Exercises Lumbar      Lumbar Exercises: Stretches   Single Knee to  Chest Stretch 10 seconds;5 reps    Lower Trunk Rotation 20 seconds;5 reps    Lower Trunk Rotation Limitations Vc an TC      Lumbar Exercises: Aerobic   Nustep level 3  5 min      Lumbar Exercises: Standing   Other Standing Lumbar Exercises sit to stand with UE on chair in front and sit back in seat 1 x 10 with eccentric loweing.  then with 10 lb KB 2 x 10 eccentric lowering      Lumbar Exercises: Seated   Other Seated Lumbar Exercises seated flexion stretch 10 sec hold forward and side to side 5 x each      Lumbar Exercises: Supine   Ab Set 10 reps;5 seconds    Pelvic Tilt 10 reps;5 seconds    Other Supine Lumbar Exercises supine table top bent knee march 10 x R and L  rest and then 10 x R and L                  PT Education - 09/16/20 1017    Education Details Went over [place] and walking program and added to [employer]) Educated Patient    Methods Explanation;Demonstration;Tactile cues;Verbal cues;Handout    Comprehension Verbalized understanding;Returned demonstration            PT Short Term Goals - 09/08/20 1526       PT SHORT TERM GOAL #1   Title Pt will be Ind in an initial HEP    Status New    Target Date 09/29/20      PT SHORT TERM GOAL #2   Title Pt will voice understanding of measures to assist in the reduction of pain    Status New    Target Date 09/29/20             PT Long Term Goals - 09/08/20 1528      PT LONG TERM GOAL #1   Title Pt will demonstrate proper understanding of body mechanics for household activities    Status New    Target Date 10/30/20      PT LONG TERM GOAL #2   Title Pt will report a decrease in low back and R LE pain with daily activities to a range of 2-6/10    Baseline 8/10    Status New    Target Date 10/30/20      PT LONG TERM GOAL #3   Title Pt FOTO score will improve to the predicted value of 59% ability    Baseline 41%    Status New    Target Date 10/30/20      PT LONG TERM GOAL #4   Title Pt will be Ind in a final HEP to maintain or improved the pt's achieved lOF    Status New    Target Date 10/30/20      PT LONG TERM GOAL #5   Title Increase pt's lumbar AROM to 25% limtation for flexion and extension as indication of decreased pain and back function    Status New    Target Date 10/30/20            Access Code: [email or web address]: [email or web address]: 04/28/2022Prepared by: Donnetta Simpers BeardsleyExercises    Sit to Stand with Counter Support - 3 x daily - 7 x weekly - 2-3 sets - 10 reps  Supine Lower Trunk Rotation - 1 x daily - 7 x weekly - 1 sets - 5 reps - 20 sec hold  Plan - 09/16/20 0936    Clinical Impression Statement [name] enters clinic with 6/10 pain.   Pt educated on importance of movement throughout day and walking program.  [name] was able to eccentrically lower with stand to sit carying KB 10# but does lose /strength/control in last 20 % of movement.  Pt added to HEP with sit to stand " movement snack" during the day.  Pt felt 50% better at end of session .  continue POC    Personal Factors and  Comorbidities Age;Past/Current Experience;Time since onset of injury/illness/exacerbation;Comorbidity 1;Comorbidity 2;Comorbidity 3+    Comorbidities Spinal stenosis, Xray from 2015 indicating degenerative changes; high BMI    Examination-Activity Limitations Stand;Carry;Lift;Bend    Examination-Participation Restrictions Community Activity;Cleaning    PT Frequency 2x / week    PT Duration 6 weeks    PT Treatment/Interventions ADLs/Self Care Home Management;Electrical Stimulation;Iontophoresis 4mg /ml Dexamethasone;Moist Heat;Ultrasound;Therapeutic exercise;Therapeutic activities;Functional mobility training;Patient/family education;Manual techniques;Dry needling;Passive range of motion;Taping;Joint Manipulations    PT Next Visit Plan Progress ther ex as indicated. Use modalities and manual techniques as indicated.    PT Home Exercise Plan [email or web address]    Consulted and Agree with Plan of Care Patient           Patient will benefit from skilled therapeutic intervention in order to improve the following deficits and impairments:  Decreased range of motion,Difficulty walking,Obesity,Decreased activity tolerance,Pain,Decreased strength,Postural dysfunction  Visit Diagnosis: Pain in right leg  Chronic bilateral low back pain with right-sided sciatica  Decreased ROM of lumbar spine  Muscle weakness (generalized)     Problem List Patient Active Problem List   Diagnosis Date Noted  . Skin tag of perianal region 02/05/2020  . Abnormal alkaline phosphatase test 10/31/2019  . Dilated bile duct ? (53mm) 10/31/2019  . Hx of adenomatous colonic polyps 09/11/2019  . Herpes zoster without complication 54/01/8118  . Poor appetite 01/17/2018  . Osteopenia 04/11/2017  . Acute right-sided low back pain without sciatica   . Shortness of breath   . Obesity 05/13/2015  . 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 Vaginal cyst 12/01/2014  . Hepatic cyst 07/06/2014  . Chronic hyponatremia 07/06/2014  .  Adrenal mass, left ([place]) 06/07/2014  . Encounter for chronic pain management 04/23/2014  . Headache 01/11/2014  . Carpal tunnel syndrome 10/01/2013  . Pain on swallowing 06/26/2013  . Pre-syncope 04/16/2013  . Left leg pain 01/03/2013  . GERD (gastroesophageal reflux disease) 01/03/2013  . Left hip pain 03/26/2012  . Insomnia 08/05/2011  . Constipation due to pain medication 08/05/2011  . Healthcare maintenance 04/08/2011  . Tooth pain 01/06/2011  . SCIATICA, LEFT 03/23/2010  . Asthma 11/23/2009  . Hyperlipidemia 03/12/2008  . Former smoker 09/02/2007  . HYPERTENSION, BENIGN SYSTEMIC 07/19/2006    Voncille Lo, PT, [place] Certified Exercise Expert for the Aging Adult  09/16/20 12:49 PM Phone: [phone] Fax: [facility] [address] [place], [place], [address] Phone: [phone]   Fax:  [phone]  Name: Raven [name] MRN: [id number] Date of Birth: 22-Jun-1950

## 2020-09-27 ENCOUNTER — Encounter: Payer: Self-pay | Admitting: Physical Therapy

## 2020-09-27 ENCOUNTER — Ambulatory Visit: Payer: Medicare HMO | Attending: Family Medicine | Admitting: Physical Therapy

## 2020-09-27 ENCOUNTER — Other Ambulatory Visit: Payer: Self-pay

## 2020-09-27 DIAGNOSIS — M6281 Muscle weakness (generalized): Secondary | ICD-10-CM | POA: Diagnosis not present

## 2020-09-27 DIAGNOSIS — G8929 Other chronic pain: Secondary | ICD-10-CM | POA: Insufficient documentation

## 2020-09-27 DIAGNOSIS — M79604 Pain in right leg: Secondary | ICD-10-CM | POA: Insufficient documentation

## 2020-09-27 DIAGNOSIS — M5441 Lumbago with sciatica, right side: Secondary | ICD-10-CM | POA: Diagnosis not present

## 2020-09-27 DIAGNOSIS — M5386 Other specified dorsopathies, lumbar region: Secondary | ICD-10-CM | POA: Diagnosis not present

## 2020-09-27 NOTE — Therapy (Signed)
[place], [place], [address] Phone: [phone]   Fax:  [phone]  Physical Therapy Treatment  Patient Details  Name: Raven [name] MRN: [id number] Date of Birth: 07-Nov-1950 Referring Provider (PT): Leeanne Rio, MD   Encounter Date: 09/27/2020   PT End of Session - 09/27/20 0939    Visit Number 3    Number of Visits 13    Date for PT Re-Evaluation 10/30/20    Authorization Type HU[employer] MEDICARE HMO; MEDICAID OF [place]    Authorization Time Period Re-assess FOTO on the 5th and 10th visits    Authorization - Visit Number 2    Authorization - Number of Visits 12    Progress Note Due on Visit 10    PT Start Time 0930    PT Stop Time 1016    PT Time Calculation (min) 46 min    Activity Tolerance Patient tolerated treatment well    Behavior During Therapy [facility] for tasks assessed/performed           Past Medical History:  Diagnosis Date  . Asthma   . Chronic back pain   . Chronic leg pain    left  . Generalized headaches    ocasional, she uses naproxen.  Marland Kitchen GERD (gastroesophageal reflux disease)   . Hx of adenomatous colonic polyps 09/11/2019  . Hyperlipidemia   . Hypertension   . Left lumbar radiculopathy   . Sciatica of left side   . Spinal stenosis of lumbar region   . Tobacco abuse     Past Surgical History:  Procedure Laterality Date  . CESAREAN SECTION     x2  . COLONOSCOPY  2021   Adenomas  . TRACHEOSTOMY     when she was 20    There were no vitals filed for this visit.   Subjective Assessment - 09/27/20 0938    Subjective I have the exercises and I have been doing them. I am still the same. I have pain here on my hip and back of thigh. I need to figure out what this is.    Currently in Pain? Yes    Pain Score 9     Pain Location Leg    Pain Orientation Right    Pain Descriptors / Indicators Aching    Pain Type Chronic pain    Aggravating Factors  first get up    Pain  Relieving Factors heating pad                             [facility] Adult PT Treatment/Exercise - 09/27/20 0001      Self-Care   Self-Care Heat/Ice Application    Heat/Ice Application Education on application of      Lumbar Exercises: Stretches   Active Hamstring Stretch 20 seconds;2 reps;Right;Left    Active Hamstring Stretch Limitations supine with strap    Single Knee to Chest Stretch 20 seconds;3 reps    Lower Trunk Rotation 20 seconds;5 reps    Lower Trunk Rotation Limitations verbal cues to keep upper trunk flat on mat and look opposite direction- she reported improved feeling of stretch    ITB Stretch 3 reps;20 seconds;Right;Left    ITB Stretch Limitations supine with strap      Lumbar Exercises: Aerobic   Nustep level 4  5 min      Lumbar Exercises: Standing   Other Standing Lumbar Exercises sit- stand - no UE  support - cues for slow eccentric control      Lumbar Exercises: Seated   Other Seated Lumbar Exercises anterior and posterior pelvic rocking      Lumbar Exercises: Supine   Pelvic Tilt 10 reps;5 seconds    Bridge 10 reps    Bridge Limitations with initial PPT    Other Supine Lumbar Exercises --      Modalities   Modalities Cryotherapy      Cryotherapy   Number Minutes Cryotherapy 8 Minutes   concurent with self care   Cryotherapy Location Hip    Type of Cryotherapy Ice pack   right                 PT Education - 09/27/20 1009    Education Details Cryotherapy, HEP    Person(s) Educated Patient    Methods Handout;Explanation    Comprehension Verbalized understanding            PT Short Term Goals - 09/27/20 1037      PT SHORT TERM GOAL #1   Title Pt will be Ind in an initial HEP    Baseline reports she has been more compliant with HEP    Period Weeks    Status On-going    Target Date 09/29/20      PT SHORT TERM GOAL #2   Title Pt will voice understanding of measures to assist in the reduction of pain    Baseline began  education on walking program, cryotherapy and HEP    Status On-going             PT Long Term Goals - 09/08/20 1528      PT LONG TERM GOAL #1   Title Pt will demonstrate proper understanding of body mechanics for household activities    Status New    Target Date 10/30/20      PT LONG TERM GOAL #2   Title Pt will report a decrease in low back and R LE pain with daily activities to a range of 2-6/10    Baseline 8/10    Status New    Target Date 10/30/20      PT LONG TERM GOAL #3   Title Pt FOTO score will improve to the predicted value of 59% ability    Baseline 41%    Status New    Target Date 10/30/20      PT LONG TERM GOAL #4   Title Pt will be Ind in a final HEP to maintain or improved the pt's achieved lOF    Status New    Target Date 10/30/20      PT LONG TERM GOAL #5   Title Increase pt's lumbar AROM to 25% limtation for flexion and extension as indication of decreased pain and back function    Status New    Target Date 10/30/20                 Plan - 09/27/20 1030    Clinical Impression Statement Pt reports continued pain in right laterla hip and posterior thigh with edema noted at right greater trochanter. Reviewed HEP and she needed mod cues to perform correctly and was able to feel stretch during LTR with cues. Began hamstring and ITB stretching which she reported a comfortable stretch. Reviewed pelvic tilits and progressed to bridging with min difficulty. Updated HEP and provided education on cryotherapy to decrease edema at greater trochanter. After  Ice pack at end of session she reported feeling  less pain.    PT Next Visit Plan Progress ther ex as indicated. Use modalities and manual techniques as indicated.  assess response to ice, did she try at home? consider ITB rolling if tolerated    PT Home Exercise Plan [email or web address]           Patient will benefit from skilled therapeutic intervention in order to improve the following deficits and impairments:   Decreased range of motion,Difficulty walking,Obesity,Decreased activity tolerance,Pain,Decreased strength,Postural dysfunction  Visit Diagnosis: Pain in right leg  Chronic bilateral low back pain with right-sided sciatica  Decreased ROM of lumbar spine  Muscle weakness (generalized)     Problem List Patient Active Problem List   Diagnosis Date Noted  . Skin tag of perianal region 02/05/2020  . Abnormal alkaline phosphatase test 10/31/2019  . Dilated bile duct ? (65mm) 10/31/2019  . Hx of adenomatous colonic polyps 09/11/2019  . Herpes zoster without complication 99/35/7017  . Poor appetite 01/17/2018  . Osteopenia 04/11/2017  . Acute right-sided low back pain without sciatica   . Shortness of breath   . Obesity 05/13/2015  . 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 Vaginal cyst 12/01/2014  . Hepatic cyst 07/06/2014  . Chronic hyponatremia 07/06/2014  . Adrenal mass, left ([place]) 06/07/2014  . Encounter for chronic pain management 04/23/2014  . Headache 01/11/2014  . Carpal tunnel syndrome 10/01/2013  . Pain on swallowing 06/26/2013  . Pre-syncope 04/16/2013  . Left leg pain 01/03/2013  . GERD (gastroesophageal reflux disease) 01/03/2013  . Left hip pain 03/26/2012  . Insomnia 08/05/2011  . Constipation due to pain medication 08/05/2011  . Healthcare maintenance 04/08/2011  . Tooth pain 01/06/2011  . SCIATICA, LEFT 03/23/2010  . Asthma 11/23/2009  . Hyperlipidemia 03/12/2008  . Former smoker 09/02/2007  . HYPERTENSION, BENIGN SYSTEMIC 07/19/2006    Dorene Ar, PTA 09/27/2020, 10:40 AM  [facility] [address] [place], [place], [address] Phone: [phone]   Fax:  [phone]  Name: Raven [name] MRN: [id number] Date of Birth: 05/24/1950

## 2020-09-27 NOTE — Patient Instructions (Addendum)
Cryotherapy Cryotherapy means treatment with cold. Ice or gel packs can be used to reduce both pain and swelling. Ice is the most helpful within the first 24 to 48 hours after an injury or flare-up from overusing a muscle or joint. Sprains, strains, spasms, burning pain, shooting pain, and aches can all be eased with ice. Ice can also be used when recovering from surgery. Ice is effective, has very few side effects, and is safe for most people to use. PRECAUTIONS  Ice is not a safe treatment option for people with:  Raynaud phenomenon. This is a condition affecting small blood vessels in the extremities. Exposure to cold may cause your problems to return.  Cold hypersensitivity. There are many forms of cold hypersensitivity, including:  Cold urticaria. Red, itchy hives appear on the skin when the tissues begin to warm after being iced.  Cold erythema. This is a red, itchy rash caused by exposure to cold.  Cold hemoglobinuria. Red blood cells break down when the tissues begin to warm after being iced. The hemoglobin that carry oxygen are passed into the urine because they cannot combine with blood proteins fast enough.  Numbness or altered sensitivity in the area being iced. If you have any of the following conditions, do not use ice until you have discussed cryotherapy with your caregiver:  Heart conditions, such as arrhythmia, angina, or chronic heart disease.  High blood pressure.  Healing wounds or open skin in the area being iced.  Current infections.  Rheumatoid arthritis.  Poor circulation.  Diabetes. Ice slows the blood flow in the region it is applied. This is beneficial when trying to stop inflamed tissues from spreading irritating chemicals to surrounding tissues. However, if you expose your skin to cold temperatures for too long or without the proper protection, you can damage your skin or nerves. Watch for signs of skin damage due to cold. HOME CARE INSTRUCTIONS Follow  these tips to use ice and cold packs safely.  Place a dry or damp towel between the ice and skin. A damp towel will cool the skin more quickly, so you may need to shorten the time that the ice is used.  For a more rapid response, add gentle compression to the ice.  Ice for no more than 10 to 20 minutes at a time. The bonier the area you are icing, the less time it will take to get the benefits of ice.  Check your skin after 5 minutes to make sure there are no signs of a poor response to cold or skin damage.  Rest 20 minutes or more between uses.  Once your skin is numb, you can end your treatment. You can test numbness by very lightly touching your skin. The touch should be so light that you do not see the skin dimple from the pressure of your fingertip. When using ice, most people will feel these normal sensations in this order: cold, burning, aching, and numbness.  Do not use ice on someone who cannot communicate their responses to pain, such as small children or people with dementia. HOW TO MAKE AN ICE PACK Ice packs are the most common way to use ice therapy. Other methods include ice massage, ice baths, and cryosprays. Muscle creams that cause a cold, tingly feeling do not offer the same benefits that ice offers and should not be used as a substitute unless recommended by your caregiver. To make an ice pack, do one of the following:  Place crushed ice or a  bag of frozen vegetables in a sealable plastic bag. Squeeze out the excess air. Place this bag inside another plastic bag. Slide the bag into a pillowcase or place a damp towel between your skin and the bag.  Mix 3 parts water with 1 part rubbing alcohol. Freeze the mixture in a sealable plastic bag. When you remove the mixture from the freezer, it will be slushy. Squeeze out the excess air. Place this bag inside another plastic bag. Slide the bag into a pillowcase or place a damp towel between your skin and the bag. SEEK MEDICAL CARE  IF:  You develop white spots on your skin. This may give the skin a blotchy (mottled) appearance.  Your skin turns blue or pale.  Your skin becomes waxy or hard.  Your swelling gets worse. MAKE SURE YOU:   Understand these instructions.  Will watch your condition.  Will get help right away if you are not doing well or get worse. Document Released: 01/02/2011 Document Revised: 09/22/2013 Document Reviewed: 01/02/2011 [facility] Patient Information 2015 [place], [place]. This information is not intended to replace advice given to you by your health care provider. Make sure you discuss any questions you have with your health care provider.   Recommend ice 2-3 x per day 10-20 minutes , use pillow case or a towel between it and your skin   Access Code: [email or web address] URL: [email or web address] Date: 09/27/2020 Prepared by: Hessie Diener  Exercises Seated Flexion Stretch - 1 x daily - 7 x weekly - 1 sets - 5-10 reps - 10 hold Hooklying Single Knee to Chest Stretch - 1 x daily - 7 x weekly - 1 sets - 10 reps - 10 hold Supine Posterior Pelvic Tilt - 1 x daily - 7 x weekly - 1 sets - 3-5 reps - 15 hold Sit to Stand with Counter Support - 3 x daily - 7 x weekly - 2-3 sets - 10 reps Supine Lower Trunk Rotation - 1 x daily - 7 x weekly - 1 sets - 5 reps - 20 sec hold Bridge - 1 x daily - 7 x weekly - 2 sets - 10 reps Hooklying Hamstring Stretch with Strap - 1 x daily - 7 x weekly - 1 sets - 3 reps - 20 hold Supine ITB Stretch with Strap - 1 x daily - 7 x weekly - 1 sets - 3 reps - 20 hold

## 2020-09-29 ENCOUNTER — Ambulatory Visit: Payer: Medicare HMO | Admitting: Physical Therapy

## 2020-09-29 ENCOUNTER — Encounter: Payer: Self-pay | Admitting: Physical Therapy

## 2020-09-29 ENCOUNTER — Other Ambulatory Visit: Payer: Self-pay

## 2020-09-29 DIAGNOSIS — M5441 Lumbago with sciatica, right side: Secondary | ICD-10-CM | POA: Diagnosis not present

## 2020-09-29 DIAGNOSIS — M6281 Muscle weakness (generalized): Secondary | ICD-10-CM | POA: Diagnosis not present

## 2020-09-29 DIAGNOSIS — G8929 Other chronic pain: Secondary | ICD-10-CM | POA: Diagnosis not present

## 2020-09-29 DIAGNOSIS — M79604 Pain in right leg: Secondary | ICD-10-CM | POA: Diagnosis not present

## 2020-09-29 DIAGNOSIS — M5386 Other specified dorsopathies, lumbar region: Secondary | ICD-10-CM | POA: Diagnosis not present

## 2020-09-29 NOTE — Therapy (Signed)
[place], [place], [address] Phone: [phone]   Fax:  [phone]  Physical Therapy Treatment  Patient Details  Name: Raven [name] MRN: [id number] Date of Birth: 1950-08-06 Referring Provider (PT): Leeanne Rio, MD   Encounter Date: 09/29/2020   PT End of Session - 09/29/20 0937    Visit Number 4    Number of Visits 13    Date for PT Re-Evaluation 10/30/20    Authorization Type HUMANA MEDICARE HMO; MEDICAID OF [place]    Authorization Time Period Re-assess FOTO on the 5th and 10th visits    Authorization - Visit Number 3    Authorization - Number of Visits 12    Progress Note Due on Visit 10    PT Start Time 0930    PT Stop Time 1015    PT Time Calculation (min) 45 min           Past Medical History:  Diagnosis Date  . Asthma   . Chronic back pain   . Chronic leg pain    left  . Generalized headaches    ocasional, she uses naproxen.  Marland Kitchen GERD (gastroesophageal reflux disease)   . Hx of adenomatous colonic polyps 09/11/2019  . Hyperlipidemia   . Hypertension   . Left lumbar radiculopathy   . Sciatica of left side   . Spinal stenosis of lumbar region   . Tobacco abuse     Past Surgical History:  Procedure Laterality Date  . CESAREAN SECTION     x2  . COLONOSCOPY  2021   Adenomas  . TRACHEOSTOMY     when she was 20    There were no vitals filed for this visit.   Subjective Assessment - 09/29/20 0936    Subjective 9/10 this morning and 6/10 now after using bengay muscle rub. I am always sore in the back and side of the right thigh.    Currently in Pain? Yes    Pain Score 6     Pain Location Leg    Pain Orientation Right    Pain Descriptors / Indicators Sore    Pain Type Chronic pain    Aggravating Factors  first get up    Pain Relieving Factors ice , heat                             [facility] Adult PT Treatment/Exercise - 09/29/20 0001      Lumbar  Exercises: Stretches   Active Hamstring Stretch 20 seconds;2 reps;Right;Left    Active Hamstring Stretch Limitations supine with strap    Single Knee to Chest Stretch 20 seconds;3 reps    Lower Trunk Rotation 20 seconds;5 reps    Lower Trunk Rotation Limitations verbal cues to keep upper trunk flat on mat and look opposite direction- she reported improved feeling of stretch    Prone on Elbows Stretch 20 seconds;5 reps    Prone on Elbows Stretch Limitations no LE pain in prone    ITB Stretch 3 reps;20 seconds;Right;Left    ITB Stretch Limitations supine with strap    Gastroc Stretch 3 reps;30 seconds    Gastroc Stretch Limitations standing runners stretch      Lumbar Exercises: Aerobic   Nustep Level 5 x 5 minutes      Lumbar Exercises: Standing   Other Standing Lumbar Exercises sit- stand x 15- no UE support - cues for slow eccentric control  Lumbar Exercises: Supine   Bridge 10 reps    Bridge Limitations with initial PPT      Lumbar Exercises: Prone   Straight Leg Raise 10 reps    Straight Leg Raises Limitations alternating hip extension, 1 pillow under hips more comfortable                    PT Short Term Goals - 09/27/20 1037      PT SHORT TERM GOAL #1   Title Pt will be Ind in an initial HEP    Baseline reports she has been more compliant with HEP    Period Weeks    Status On-going    Target Date 09/29/20      PT SHORT TERM GOAL #2   Title Pt will voice understanding of measures to assist in the reduction of pain    Baseline began education on walking program, cryotherapy and HEP    Status On-going             PT Long Term Goals - 09/08/20 1528      PT LONG TERM GOAL #1   Title Pt will demonstrate proper understanding of body mechanics for household activities    Status New    Target Date 10/30/20      PT LONG TERM GOAL #2   Title Pt will report a decrease in low back and R LE pain with daily activities to a range of 2-6/10    Baseline 8/10     Status New    Target Date 10/30/20      PT LONG TERM GOAL #3   Title Pt FOTO score will improve to the predicted value of 59% ability    Baseline 41%    Status New    Target Date 10/30/20      PT LONG TERM GOAL #4   Title Pt will be Ind in a final HEP to maintain or improved the pt's achieved lOF    Status New    Target Date 10/30/20      PT LONG TERM GOAL #5   Title Increase pt's lumbar AROM to 25% limtation for flexion and extension as indication of decreased pain and back function    Status New    Target Date 10/30/20                 Plan - 09/29/20 0934    Clinical Impression Statement Pt reports she is sore in her right posterior thigh and lateral thigh. She is doing the new stretches and reports she is about the same. She reports the ice was beneficial last session but reports she has not performed it at home. Continued with current HEP and added standing gastroc stretch and pone hip exension. She fatiques with bridging quickly. She requires cues to correctly perform sit to stand with hip hinge. Updated HEP. She reported feeling good after session.    PT Next Visit Plan Progress ther ex as indicated. Use modalities and manual techniques as indicated.  assess response to ice, did she try at home? consider ITB rolling if tolerated    PT Home Exercise Plan [email or web address]           Patient will benefit from skilled therapeutic intervention in order to improve the following deficits and impairments:  Decreased range of motion,Difficulty walking,Obesity,Decreased activity tolerance,Pain,Decreased strength,Postural dysfunction  Visit Diagnosis: Pain in right leg  Chronic bilateral low back pain with right-sided sciatica  Decreased ROM of lumbar spine  Muscle weakness (generalized)     Problem List Patient Active Problem List   Diagnosis Date Noted  . Skin tag of perianal region 02/05/2020  . Abnormal alkaline phosphatase test 10/31/2019  . Dilated bile duct ? (28mm)  10/31/2019  . Hx of adenomatous colonic polyps 09/11/2019  . Herpes zoster without complication 03/18/2535  . Poor appetite 01/17/2018  . Osteopenia 04/11/2017  . Acute right-sided low back pain without sciatica   . Shortness of breath   . Obesity 05/13/2015  . 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 Vaginal cyst 12/01/2014  . Hepatic cyst 07/06/2014  . Chronic hyponatremia 07/06/2014  . Adrenal mass, left ([place]) 06/07/2014  . Encounter for chronic pain management 04/23/2014  . Headache 01/11/2014  . Carpal tunnel syndrome 10/01/2013  . Pain on swallowing 06/26/2013  . Pre-syncope 04/16/2013  . Left leg pain 01/03/2013  . GERD (gastroesophageal reflux disease) 01/03/2013  . Left hip pain 03/26/2012  . Insomnia 08/05/2011  . Constipation due to pain medication 08/05/2011  . Healthcare maintenance 04/08/2011  . Tooth pain 01/06/2011  . SCIATICA, LEFT 03/23/2010  . Asthma 11/23/2009  . Hyperlipidemia 03/12/2008  . Former smoker 09/02/2007  . HYPERTENSION, BENIGN SYSTEMIC 07/19/2006    Dorene Ar, PTA 09/29/2020, 10:18 AM  [place], [place], [address] Phone: [phone]   Fax:  [phone]  Name: KRISTILYN COLTRANE MRN: [id number] Date of Birth: Dec 28, 1950

## 2020-10-05 ENCOUNTER — Other Ambulatory Visit: Payer: Self-pay

## 2020-10-05 ENCOUNTER — Encounter: Payer: Self-pay | Admitting: Physical Therapy

## 2020-10-05 ENCOUNTER — Ambulatory Visit: Payer: Medicare HMO | Admitting: Physical Therapy

## 2020-10-05 DIAGNOSIS — M5386 Other specified dorsopathies, lumbar region: Secondary | ICD-10-CM | POA: Diagnosis not present

## 2020-10-05 DIAGNOSIS — G8929 Other chronic pain: Secondary | ICD-10-CM | POA: Diagnosis not present

## 2020-10-05 DIAGNOSIS — M6281 Muscle weakness (generalized): Secondary | ICD-10-CM | POA: Diagnosis not present

## 2020-10-05 DIAGNOSIS — M5441 Lumbago with sciatica, right side: Secondary | ICD-10-CM | POA: Diagnosis not present

## 2020-10-05 DIAGNOSIS — M79604 Pain in right leg: Secondary | ICD-10-CM | POA: Diagnosis not present

## 2020-10-05 NOTE — Therapy (Signed)
[place], [place], [address] Phone: [phone]   Fax:  [phone]  Physical Therapy Treatment  Patient Details  Name: Raven [name] MRN: [id number] Date of Birth: 1951/02/09 Referring Provider (PT): Leeanne Rio, MD   Encounter Date: 10/05/2020   PT End of Session - 10/05/20 1028    Visit Number 5    Number of Visits 13    Date for PT Re-Evaluation 10/30/20    Authorization Type [employer] MEDICARE HMO; MEDICAID OF [place]    Authorization Time Period Re-assess FOTO on the 5th and 10th visits    Authorization - Visit Number 4    Authorization - Number of Visits 12    Progress Note Due on Visit 10    PT Start Time 0930    PT Stop Time 1015    PT Time Calculation (min) 45 min           Past Medical History:  Diagnosis Date  . Asthma   . Chronic back pain   . Chronic leg pain    left  . Generalized headaches    ocasional, she uses naproxen.  Marland Kitchen GERD (gastroesophageal reflux disease)   . Hx of adenomatous colonic polyps 09/11/2019  . Hyperlipidemia   . Hypertension   . Left lumbar radiculopathy   . Sciatica of left side   . Spinal stenosis of lumbar region   . Tobacco abuse     Past Surgical History:  Procedure Laterality Date  . CESAREAN SECTION     x2  . COLONOSCOPY  2021   Adenomas  . TRACHEOSTOMY     when she was 20    There were no vitals filed for this visit.   Subjective Assessment - 10/05/20 0934    Subjective My pain was increasing this morning but I did the exercises and it felt better. I still lather up with aspercream.    Currently in Pain? Yes    Pain Score 5     Pain Location Back    Pain Orientation Right    Pain Descriptors / Indicators Sore    Pain Type Chronic pain    Pain Radiating Towards right thigh    Aggravating Factors  first get up in the morning    Pain Relieving Factors stretches, aspercream, heat or ice                              [facility] Adult PT Treatment/Exercise - 10/05/20 0001      Self-Care   Self-Care Other Self-Care Comments    Heat/Ice Application self massage using massage roller to right ITB      Lumbar Exercises: Stretches   Active Hamstring Stretch 20 seconds;2 reps;Right;Left    Active Hamstring Stretch Limitations supine with strap    Single Knee to Chest Stretch 20 seconds;3 reps    Lower Trunk Rotation 20 seconds;5 reps    Lower Trunk Rotation Limitations verbal cues to keep upper trunk flat on mat and look opposite direction- she reported improved feeling of stretch    Quad Stretch 3 reps;20 seconds    Quad Stretch Limitations prone quad stretch    ITB Stretch 3 reps;20 seconds;Right;Left    ITB Stretch Limitations supine with strap      Lumbar Exercises: Aerobic   Nustep Level 4 x 5 minutes   LE only     Lumbar Exercises: Standing   Other Standing  Lumbar Exercises sit- stand x 10- no UE support - cues for slow eccentric control   improved technique with min cues today     Lumbar Exercises: Supine   Bridge 10 reps    Bridge Limitations arms crossed -10 sec holds    Other Supine Lumbar Exercises supine table top bent knee march 10 x R and L  rest and then 10 x R and L   mod cues for table top with foot taps     Lumbar Exercises: Prone   Straight Leg Raise --    Straight Leg Raises Limitations --      Manual Therapy   Manual therapy comments massage roller to right ITB                    PT Short Term Goals - 10/05/20 1024      PT SHORT TERM GOAL #1   Title Pt will be Ind in an initial HEP    Baseline pt reports she is performing every morning    Period Weeks    Status Achieved    Target Date 09/29/20      PT SHORT TERM GOAL #2   Title Pt will voice understanding of measures to assist in the reduction of pain    Baseline pt reports using heat, ice, HEP, and muscle rub to assist in pain reduction.    Period Weeks    Status Achieved     Target Date 09/29/20             PT Long Term Goals - 09/08/20 1528      PT LONG TERM GOAL #1   Title Pt will demonstrate proper understanding of body mechanics for household activities    Status New    Target Date 10/30/20      PT LONG TERM GOAL #2   Title Pt will report a decrease in low back and R LE pain with daily activities to a range of 2-6/10    Baseline 8/10    Status New    Target Date 10/30/20      PT LONG TERM GOAL #3   Title Pt FOTO score will improve to the predicted value of 59% ability    Baseline 41%    Status New    Target Date 10/30/20      PT LONG TERM GOAL #4   Title Pt will be Ind in a final HEP to maintain or improved the pt's achieved lOF    Status New    Target Date 10/30/20      PT LONG TERM GOAL #5   Title Increase pt's lumbar AROM to 25% limtation for flexion and extension as indication of decreased pain and back function    Status New    Target Date 10/30/20                 Plan - 10/05/20 1025    Clinical Impression Statement Pt can verbalize pain reduction strategies and reports increased compliance with HEP. STG# 1,#2 met. Continued with LE stretching and gluteal activation. Added prone quad stretch to HEP and reviewed table top core strengthening. Pt felt good at end of session.    PT Next Visit Plan Progress ther ex as indicated. Use modalities and manual techniques as indicated.  contiue ITB roller and quad stretching , core    PT Home Exercise Plan [email or web address]           Patient will benefit from skilled  therapeutic intervention in order to improve the following deficits and impairments:  Decreased range of motion,Difficulty walking,Obesity,Decreased activity tolerance,Pain,Decreased strength,Postural dysfunction  Visit Diagnosis: Pain in right leg  Chronic bilateral low back pain with right-sided sciatica  Decreased ROM of lumbar spine  Muscle weakness (generalized)     Problem List Patient Active Problem List    Diagnosis Date Noted  . Skin tag of perianal region 02/05/2020  . Abnormal alkaline phosphatase test 10/31/2019  . Dilated bile duct ? (61m) 10/31/2019  . Hx of adenomatous colonic polyps 09/11/2019  . Herpes zoster without complication 016/96/7893 . Poor appetite 01/17/2018  . Osteopenia 04/11/2017  . Acute right-sided low back pain without sciatica   . Shortness of breath   . Obesity 05/13/2015  . 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 Vaginal cyst 12/01/2014  . Hepatic cyst 07/06/2014  . Chronic hyponatremia 07/06/2014  . [place]  . Encounter for chronic pain management 04/23/2014  . Headache 01/11/2014  . Carpal tunnel syndrome 10/01/2013  . Pain on swallowing 06/26/2013  . Pre-syncope 04/16/2013  . Left leg pain 01/03/2013  . GERD (gastroesophageal reflux disease) 01/03/2013  . Left hip pain 03/26/2012  . Insomnia 08/05/2011  . Constipation due to pain medication 08/05/2011  . Healthcare maintenance 04/08/2011  . Tooth pain 01/06/2011  . SCIATICA, LEFT 03/23/2010  . Asthma 11/23/2009  . Hyperlipidemia 03/12/2008  . Former smoker 09/02/2007  . HYPERTENSION, BENIGN SYSTEMIC 07/19/2006    DDorene Ar PTA 10/05/2020, 10:29 AM  [facility] [place] [address]Phone: [phone]  Fax:  [phone] Name: Raven QUINTEROSMRN: [id number] of Birth: 7Apr 24, 1952

## 2020-10-07 ENCOUNTER — Encounter: Payer: Self-pay | Admitting: Physical Therapy

## 2020-10-07 ENCOUNTER — Ambulatory Visit: Payer: Medicare HMO | Admitting: Physical Therapy

## 2020-10-07 ENCOUNTER — Other Ambulatory Visit: Payer: Self-pay

## 2020-10-07 DIAGNOSIS — M5441 Lumbago with sciatica, right side: Secondary | ICD-10-CM | POA: Diagnosis not present

## 2020-10-07 DIAGNOSIS — G8929 Other chronic pain: Secondary | ICD-10-CM | POA: Diagnosis not present

## 2020-10-07 DIAGNOSIS — M79604 Pain in right leg: Secondary | ICD-10-CM | POA: Diagnosis not present

## 2020-10-07 DIAGNOSIS — M5386 Other specified dorsopathies, lumbar region: Secondary | ICD-10-CM | POA: Diagnosis not present

## 2020-10-07 DIAGNOSIS — M6281 Muscle weakness (generalized): Secondary | ICD-10-CM

## 2020-10-07 NOTE — Therapy (Signed)
[place], [place], [address] Phone: [phone]   Fax:  [phone]  Physical Therapy Treatment  Patient Details  Name: Raven [name] MRN: [id number] Date of Birth: Aug 25, 1950 Referring Provider (PT): Leeanne Rio, MD   Encounter Date: 10/07/2020   PT End of Session - 10/07/20 1028    Visit Number 6    Number of Visits 13    Date for PT Re-Evaluation 10/30/20    Authorization Type HU[employer]NA MEDICARE HMO; MEDICAID OF [place]    Authorization Time Period Re-assess FOTO on the 5th and 10th visits    Authorization - Visit Number 5    Authorization - Number of Visits 12    Progress Note Due on Visit 10    PT Start Time 0925    PT Stop Time 1012    PT Time Calculation (min) 47 min           Past Medical History:  Diagnosis Date  . Asthma   . Chronic back pain   . Chronic leg pain    left  . Generalized headaches    ocasional, she uses naproxen.  Marland Kitchen GERD (gastroesophageal reflux disease)   . Hx of adenomatous colonic polyps 09/11/2019  . Hyperlipidemia   . Hypertension   . Left lumbar radiculopathy   . Sciatica of left side   . Spinal stenosis of lumbar region   . Tobacco abuse     Past Surgical History:  Procedure Laterality Date  . CESAREAN SECTION     x2  . COLONOSCOPY  2021   Adenomas  . TRACHEOSTOMY     when she was 20    There were no vitals filed for this visit.   Subjective Assessment - 10/07/20 0926    Subjective The pain in my thigh started yesterday and is worse this morning. I think it started yesterday when I was bending to put the clothes in the dryer.    Currently in Pain? Yes    Pain Score 7     Pain Location Leg    Pain Orientation Lateral;Left    Pain Descriptors / Indicators Sore    Pain Type Chronic pain              [facility] PT Assessment - 10/07/20 0001      Observation/Other Assessments   Focus on Therapeutic Outcomes (FOTO)  60% ability                          [facility] Adult PT Treatment/Exercise - 10/07/20 0001      Lumbar Exercises: Stretches   Active Hamstring Stretch 20 seconds;2 reps;Right;Left    Active Hamstring Stretch Limitations supine with strap    Lower Trunk Rotation 20 seconds;5 reps    Lower Trunk Rotation Limitations verbal cues to keep upper trunk flat on mat and look opposite direction- she reported improved feeling of stretch    Quad Stretch 3 reps;20 seconds    Quad Stretch Limitations prone quad stretch    ITB Stretch 3 reps;20 seconds;Right;Left    ITB Stretch Limitations supine with strap      Lumbar Exercises: Aerobic   Nustep L5 x 5 minutes      Lumbar Exercises: Standing   Other Standing Lumbar Exercises Forward lunge between counter and chair x 10 each using bilateral UE.    Other Standing Lumbar Exercises 10# KB with sit-stand- improved hip hinge 10 x 2,  10# KB forward squat x 10      Lumbar Exercises: Supine   Bridge 10 reps    Bridge Limitations 10 sec holds    Bridge with clamshell 10 reps    Bridge with [employer] Limitations green    Other Supine Lumbar Exercises supine table top bent knee march 10 x R and L  rest and then 10 x R and L   mod cues for table top with foot taps   Other Supine Lumbar Exercises supine green band clams      Manual Therapy   Manual therapy comments massage roller to right ITB                    PT Short Term Goals - 10/05/20 1024      PT SHORT TERM GOAL #1   Title Pt will be Ind in an initial HEP    Baseline pt reports she is performing every morning    Period Weeks    Status Achieved    Target Date 09/29/20      PT SHORT TERM GOAL #2   Title Pt will voice understanding of measures to assist in the reduction of pain    Baseline pt reports using heat, ice, HEP, and muscle rub to assist in pain reduction.    Period Weeks    Status Achieved    Target Date 09/29/20             PT Long Term Goals - 10/07/20 1117      PT  LONG TERM GOAL #1   Title Pt will demonstrate proper understanding of body mechanics for household activities    Baseline imroving, less cues required    Period Weeks    Status On-going      PT LONG TERM GOAL #2   Title Pt will report a decrease in low back and R LE pain with daily activities to a range of 2-6/10    Baseline pain is improved but can still exacerbate to 8-10/10    Period Weeks    Status On-going      PT LONG TERM GOAL #3   Title Pt FOTO score will improve to the predicted value of 59% ability    Baseline 41% improved to 60%    Period Weeks    Status Achieved      PT LONG TERM GOAL #4   Title Pt will be Ind in a final HEP to maintain or improved the pt's achieved lOF    Period Weeks    Status On-going      PT LONG TERM GOAL #5   Title Increase pt's lumbar AROM to 25% limtation for flexion and extension as indication of decreased pain and back function    Period Weeks    Status Unable to assess                 Plan - 10/07/20 1111    Clinical Impression Statement Pt reports increased pain yesterday and this morning in her right lateral hip and thigh. She attributes the increased pain to putting clothes in and out of the dryer. Continued with hip hinge mechanics and she needs min cues for correct sit- stand technique. Added 10# KB and she was able to return demo correct techinique with sit-stands and squats. Began forward lunges using bilateral UEs. Her FOTO score has improved from 41% to 60%. She is progressing toward remaining LTGS    PT Next Visit Plan  ITB roller, quad stretching, core, lunges/squats, measure lumbar ROM    PT Home Exercise Plan [email or web address]    Consulted and Agree with Plan of Care Patient           Patient will benefit from skilled therapeutic intervention in order to improve the following deficits and impairments:  Decreased range of motion,Difficulty walking,Obesity,Decreased activity tolerance,Pain,Decreased strength,Postural  dysfunction  Visit Diagnosis: Pain in right leg  Chronic bilateral low back pain with right-sided sciatica  Decreased ROM of lumbar spine  Muscle weakness (generalized)     Problem List Patient Active Problem List   Diagnosis Date Noted  . Skin tag of perianal region 02/05/2020  . Abnormal alkaline phosphatase test 10/31/2019  . Dilated bile duct ? (80mm) 10/31/2019  . Hx of adenomatous colonic polyps 09/11/2019  . Herpes zoster without complication 25/09/3974  . Poor appetite 01/17/2018  . Osteopenia 04/11/2017  . Acute right-sided low back pain without sciatica   . Shortness of breath   . Obesity 05/13/2015  . 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 Vaginal cyst 12/01/2014  . Hepatic cyst 07/06/2014  . Chronic hyponatremia 07/06/2014  . Adrenal mass, left ([place]) 06/07/2014  . Encounter for chronic pain management 04/23/2014  . Headache 01/11/2014  . Carpal tunnel syndrome 10/01/2013  . Pain on swallowing 06/26/2013  . Pre-syncope 04/16/2013  . Left leg pain 01/03/2013  . GERD (gastroesophageal reflux disease) 01/03/2013  . Left hip pain 03/26/2012  . Insomnia 08/05/2011  . Constipation due to pain medication 08/05/2011  . Healthcare maintenance 04/08/2011  . Tooth pain 01/06/2011  . SCIATICA, LEFT 03/23/2010  . Asthma 11/23/2009  . Hyperlipidemia 03/12/2008  . Former smoker 09/02/2007  . HYPERTENSION, BENIGN SYSTEMIC 07/19/2006    Dorene Ar  , PTA 10/07/2020, 11:21 AM  [facility] [address] [place], [place], [address] Phone: [phone]   Fax:  [phone]  Name: Raven [name] MRN: [id number] Date of Birth: May 22, 1951

## 2020-10-11 ENCOUNTER — Other Ambulatory Visit: Payer: Self-pay | Admitting: Family Medicine

## 2020-10-12 ENCOUNTER — Other Ambulatory Visit: Payer: Self-pay

## 2020-10-12 ENCOUNTER — Ambulatory Visit: Payer: Medicare HMO | Admitting: Physical Therapy

## 2020-10-12 ENCOUNTER — Encounter: Payer: Self-pay | Admitting: Physical Therapy

## 2020-10-12 DIAGNOSIS — G8929 Other chronic pain: Secondary | ICD-10-CM | POA: Diagnosis not present

## 2020-10-12 DIAGNOSIS — M5386 Other specified dorsopathies, lumbar region: Secondary | ICD-10-CM | POA: Diagnosis not present

## 2020-10-12 DIAGNOSIS — M79604 Pain in right leg: Secondary | ICD-10-CM | POA: Diagnosis not present

## 2020-10-12 DIAGNOSIS — M6281 Muscle weakness (generalized): Secondary | ICD-10-CM

## 2020-10-12 DIAGNOSIS — M5441 Lumbago with sciatica, right side: Secondary | ICD-10-CM | POA: Diagnosis not present

## 2020-10-12 NOTE — Therapy (Signed)
[place], [place], [address] Phone: [phone]   Fax:  [phone]  Physical Therapy Treatment  Patient Details  Name: Raven [name] MRN: [id number] Date of Birth: [birth year] Referring Provider (PT): Leeanne Rio, MD   Encounter Date: 10/12/2020   PT End of Session - 10/12/20 0939    Visit Number 7    Number of Visits 13    Date for PT Re-Evaluation 10/30/20    Authorization Type HUMANA MEDICARE HMO; MEDICAID OF [place]    Authorization Time Period Re-assess FOTO on the 5th and 10th visits    Authorization - Visit Number 6    Authorization - Number of Visits 12    Progress Note Due on Visit 10    PT Start Time 0930    PT Stop Time 1015    PT Time Calculation (min) 45 min           Past Medical History:  Diagnosis Date  . Asthma   . Chronic back pain   . Chronic leg pain    left  . Generalized headaches    ocasional, she uses naproxen.  Marland Kitchen GERD (gastroesophageal reflux disease)   . Hx of adenomatous colonic polyps 09/11/2019  . Hyperlipidemia   . Hypertension   . Left lumbar radiculopathy   . Sciatica of left side   . Spinal stenosis of lumbar region   . Tobacco abuse     Past Surgical History:  Procedure Laterality Date  . CESAREAN SECTION     x2  . COLONOSCOPY  2021   Adenomas  . TRACHEOSTOMY     when she was 20    There were no vitals filed for this visit.   Subjective Assessment - 10/12/20 0933    Subjective I am aching this morning with the rainy weather. I almost did not come today. I am having pain in both outside thighs today.    Currently in Pain? Yes    Pain Score 3     Pain Location Back    Pain Orientation Lower    Pain Descriptors / Indicators Sore    Pain Type Chronic pain    Pain Radiating Towards right and left lateral thigh    Aggravating Factors  first get up in the morning, rain    Pain Relieving Factors stretches, muscle              [facility] PT  Assessment - 10/12/20 0001      AROM   Lumbar Flexion reaches to lower shin, feels pulling in hamstrings    Lumbar Extension WFL , min pain in low back                         [facility] Adult PT Treatment/Exercise - 10/12/20 0001      Lumbar Exercises: Stretches   Active Hamstring Stretch 20 seconds;2 reps;Right;Left    Active Hamstring Stretch Limitations supine with strap    Single Knee to Chest Stretch 20 seconds;3 reps    Lower Trunk Rotation 20 seconds;2 reps    Quad Stretch 2 reps;20 seconds    Quad Stretch Limitations prone quad stretch    ITB Stretch 20 seconds;2 reps    ITB Stretch Limitations supine with strap      Lumbar Exercises: Aerobic   Nustep L5 x 6 minutes      Lumbar Exercises: Standing   Other Standing Lumbar Exercises Forward lunge with 1  hand on counter x 10 each with min cues for technique    Other Standing Lumbar Exercises 15# KB sit to stands      Lumbar Exercises: Supine   Bridge 10 reps    Other Supine Lumbar Exercises supine table top bent knee march 10 x R and L  rest and then 10 x R and L   mod cues for table top with foot taps                   PT Short Term Goals - 10/05/20 1024      PT SHORT TERM GOAL #1   Title Pt will be Ind in an initial HEP    Baseline pt reports she is performing every morning    Period Weeks    Status Achieved    Target Date 09/29/20      PT SHORT TERM GOAL #2   Title Pt will voice understanding of measures to assist in the reduction of pain    Baseline pt reports using heat, ice, HEP, and muscle rub to assist in pain reduction.    Period Weeks    Status Achieved    Target Date 09/29/20             PT Long Term Goals - 10/12/20 1006      PT LONG TERM GOAL #1   Title Pt will demonstrate proper understanding of body mechanics for household activities    Baseline imroving, less cues required    Period Weeks    Status On-going      PT LONG TERM GOAL #2   Title Pt will report a  decrease in low back and R LE pain with daily activities to a range of 2-6/10    Baseline average 3-4/10    Period Weeks    Status Achieved      PT LONG TERM GOAL #3   Title Pt FOTO score will improve to the predicted value of 59% ability    Baseline 41% improved to 60%    Period Weeks    Status Achieved      PT LONG TERM GOAL #4   Title Pt will be Ind in a final HEP to maintain or improved the pt's achieved lOF    Baseline min cues for core strength    Period Weeks    Status On-going      PT LONG TERM GOAL #5   Title Increase pt's lumbar AROM to 25% limtation for flexion and extension as indication of decreased pain and back function    Baseline less pain and improved ROM for flexiona and extension    Period Weeks    Status Achieved                 Plan - 10/12/20 0955    Clinical Impression Statement Pt reports she is 70% improved compared to initial evaluation. She reports pain was a 9/10 and now pain averages 3-4/10 pain. She reports her HEP is helpful in the monring to decrease her pain as well as her muscle rub and heat pad. Continued with lunges and weighted sit-stands. While holding weight she can demonstrate appropriate hip hinge/squat mechanics. She continues to require cues for body mechanics without weight. Reviewed HEP and she requires min cues to complete correctly. She will likely be ready for discharge to HEP at next visit.    PT Next Visit Plan Most LTGS met. appropriate for one more visit for HEP review and  body mechanics/hip hinge review    PT Home Exercise Plan [email or web address]    Consulted and Agree with Plan of Care Patient           Patient will benefit from skilled therapeutic intervention in order to improve the following deficits and impairments:  Decreased range of motion,Difficulty walking,Obesity,Decreased activity tolerance,Pain,Decreased strength,Postural dysfunction  Visit Diagnosis: Pain in right leg  Chronic bilateral low back pain with  right-sided sciatica  Decreased ROM of lumbar spine  Muscle weakness (generalized)     Problem List Patient Active Problem List   Diagnosis Date Noted  . Skin tag of perianal region 02/05/2020  . Abnormal alkaline phosphatase test 10/31/2019  . Dilated bile duct ? (19m) 10/31/2019  . Hx of adenomatous colonic polyps 09/11/2019  . Herpes zoster without complication 042/35/3614 . Poor appetite 01/17/2018  . Osteopenia 04/11/2017  . Acute right-sided low back pain without sciatica   . Shortness of breath   . Obesity 05/13/2015  . 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 Vaginal cyst 12/01/2014  . Hepatic cyst 07/06/2014  . Chronic hyponatremia 07/06/2014  . Ad[place]al [place], left [place] 06/07/2014  . Encounter for chronic pain management 04/23/2014  . Headache 01/11/2014  . Carpal tunnel syndrome 10/01/2013  . Pain on swallowing 06/26/2013  . Pre-syncope 04/16/2013  . Left leg pain 01/03/2013  . GERD (gastroesophageal reflux disease) 01/03/2013  . Left hip pain 03/26/2012  . Insomnia 08/05/2011  . Constipation due to pain medication 08/05/2011  . Healthcare maintenance 04/08/2011  . Tooth pain 01/06/2011  . SCIATICA, LEFT 03/23/2010  . Asthma 11/23/2009  . Hyperlipidemia 03/12/2008  . Former smoker 09/02/2007  . HYPERTENSION, BENIGN SYSTEMIC 07/19/2006    DDorene Ar PTA 10/12/2020, 10:14 AM  [place] [address]Phone: [phone]  Fax:  [phone] Name: JSINAHI KNIGHTSMRN: [id number] of Birth: 7August 14, 1952

## 2020-10-14 ENCOUNTER — Other Ambulatory Visit: Payer: Self-pay

## 2020-10-14 ENCOUNTER — Ambulatory Visit: Payer: Medicare HMO

## 2020-10-14 DIAGNOSIS — M6281 Muscle weakness (generalized): Secondary | ICD-10-CM

## 2020-10-14 DIAGNOSIS — M5386 Other specified dorsopathies, lumbar region: Secondary | ICD-10-CM | POA: Diagnosis not present

## 2020-10-14 DIAGNOSIS — G8929 Other chronic pain: Secondary | ICD-10-CM | POA: Diagnosis not present

## 2020-10-14 DIAGNOSIS — M79604 Pain in right leg: Secondary | ICD-10-CM | POA: Diagnosis not present

## 2020-10-14 DIAGNOSIS — M5441 Lumbago with sciatica, right side: Secondary | ICD-10-CM | POA: Diagnosis not present

## 2020-10-14 NOTE — Therapy (Signed)
[place], [place], [address] Phone: [phone]   Fax:  [phone]  Physical Therapy Treatment/Discharge  Patient Details  Name: Raven [name] MRN: [id number] Date of Birth: May 29, 1950 Referring Provider (PT): Leeanne Rio, MD   Encounter Date: 10/14/2020   PT End of Session - 10/14/20 1033    Visit Number 8    Number of Visits 13    Date for PT Re-Evaluation 10/30/20    Authorization Type [employer] MEDICARE HMO; MEDICAID OF [place]    Authorization Time Period Re-assess FOTO on the 5th and 10th visits    Authorization - Visit Number 7    Authorization - Number of Visits 12    PT Start Time 0932    PT Stop Time 1017    PT Time Calculation (min) 45 min    Activity Tolerance Patient tolerated treatment well    Behavior During Therapy [facility] for tasks assessed/performed           Past Medical History:  Diagnosis Date  . Asthma   . Chronic back pain   . Chronic leg pain    left  . Generalized headaches    ocasional, she uses naproxen.  Mar[place] Kitchen GERD (gastroesophageal reflux disease)   . Hx of adenomatous colonic polyps 09/11/2019  . Hyperlipidemia   . Hypertension   . Left lumbar radiculopathy   . Sciatica of left side   . Spinal stenosis of lumbar region   . Tobacco abuse     Past Surgical History:  Procedure Laterality Date  . CESAREAN SECTION     x2  . COLONOSCOPY  2021   Adenomas  . TRACHEOSTOMY     when she was 20    There were no vitals filed for this visit.   Subjective Assessment - 10/14/20 0939    Subjective Pt reports she is continuing to do better, is pleased with her progress and is able to better manage her pain.    How long can you sit comfortably? no an issue    How long can you stand comfortably? 1 hour    How long can you walk comfortably? 2-3 hours in shopping and is less dependent on a cart    Diagnostic tests 05/16/2014  FINDINGS:  Examination demonstrates mild  spondylosis throughout the lumbar  spine. There is no evidence of compression fracture. There is a  subtle grade 1 anterolisthesis of L5 with respect to L4 and S1.  There is moderate facet arthropathy present. Possible disc space  narrowing at the L3-4, L4-5 and L5-S1 levels. Calcified plaque  present over the abdominal aorta and iliac arteries.     IMPRESSION:  Mild spondylosis with mild disc disease from the L3-4 level to the  L5-S1 level.     Subtle grade 1 anterolisthesis of L5 with respect L4 and S1 likely  due to the moderate facet arthropathy.    Currently in Pain? Yes    Pain Score 1     Pain Location Back    Pain Orientation Lower    Pain Descriptors / Indicators Sore    Pain Type Chronic pain    Pain Radiating Towards right and left lateral thigh    Pain Frequency Intermittent    Aggravating Factors  first get up in the morning, rain    Pain Relieving Factors stretches, muscle  [place] Adult PT Treatment/Exercise - 10/14/20 0001      Lumbar Exercises: Stretches   Single Knee to Chest Stretch 20 seconds;3 reps    Lower Trunk Rotation 20 seconds;2 reps    Piriformis Stretch Right;Left;3 reps;20 seconds    Other Lumbar Stretch Exercise Trunk flexion, seated, 3x, 15 sec      Lumbar Exercises: Supine   Pelvic Tilt 10 reps   3 sec   Pelvic Tilt Limitations with hand reach    Bent Knee Raise 5 reps;5 seconds    Bent Knee Raise Limitations bracing                  PT Education - 10/14/20 1024    Education Details Proper body mechanics for household activities and for final HEP    Person(s) Educated Patient    Methods Explanation;Demonstration;Tactile cues;Verbal cues;Handout    Comprehension Verbalized understanding;Tactile cues required;Verbal cues required;Returned demonstration            PT Short Term Goals - 10/05/20 1024      PT SHORT TERM GOAL #1   Title Pt will be Ind in an initial HEP    Baseline pt reports she  is performing every morning    Period Weeks    Status Achieved    Target Date 09/29/20      PT SHORT TERM GOAL #2   Title Pt will voice understanding of measures to assist in the reduction of pain    Baseline pt reports using heat, ice, HEP, and muscle rub to assist in pain reduction.    Period Weeks    Status Achieved    Target Date 09/29/20             PT Long Term Goals - 10/14/20 1036      PT LONG TERM GOAL #1   Title Pt will demonstrate proper understanding of body mechanics for household activities    Status Achieved    Target Date 10/14/20      PT LONG TERM GOAL #2   Title Pt will report a decrease in low back and R LE pain with daily activities to a range of 2-6/10    Baseline average 3-4/10    Period Weeks    Status Achieved      PT LONG TERM GOAL #3   Title Pt FOTO score will improve to the predicted value of 59% ability.    Baseline 41% improved to 60%    Period Weeks    Status Achieved    Target Date 10/14/20      PT LONG TERM GOAL #4   Title Pt will be Ind in a final HEP to maintain or improved the pt's achieved lOF    Period Weeks    Status Achieved    Target Date 10/14/20      PT LONG TERM GOAL #5   Title Increase pt's lumbar AROM to 25% limtation for flexion and extension as indication of decreased pain and back function    Baseline less pain and improved ROM for flexion and extension    Period Weeks    Status Achieved    Target Date 10/14/20                 Plan - 10/14/20 1040    Clinical Impression Statement Pt has made good progress in PT and is very pleased with her progress. Pain, mobility and function have all improved as well as pt's body mechanics and understanding of  measures to manage pain. Pt is Ind with a HEP which has been effective in reducing pt's pain. All PT goals have been met. Pt is DCed to a HEP. Pt is in agreement with DC.    Personal Factors and Comorbidities Age;Past/Current Experience;Time since onset of  injury/illness/exacerbation;Comorbidity 1;Comorbidity 2;Comorbidity 3+    Comorbidities Spinal stenosis, Xray from 2015 indicating degenerative changes; high BMI    Examination-Activity Limitations Stand;Carry;Lift;Bend    Examination-Participation Restrictions Community Activity;Cleaning    Stability/Clinical Decision Making Evolving/Moderate complexity    Clinical Decision Making Moderate    Rehab Potential Good    PT Frequency 2x / week    PT Duration 6 weeks    PT Treatment/Interventions ADLs/Self Care Home Management;Electrical Stimulation;Iontophoresis 89m/ml Dexamethasone;Moist Heat;Ultrasound;Therapeutic exercise;Therapeutic activities;Functional mobility training;Patient/family education;Manual techniques;Dry needling;Passive range of motion;Taping;Joint Manipulations    PT Home Exercise Plan [email or web address]    Consulted and Agree with Plan of Care Patient           Patient will benefit from skilled therapeutic intervention in order to improve the following deficits and impairments:  Decreased range of motion,Difficulty walking,Obesity,Decreased activity tolerance,Pain,Decreased strength,Postural dysfunction  Visit Diagnosis: Pain in right leg  Chronic bilateral low back pain with right-sided sciatica  Muscle weakness (generalized)  Decreased ROM of lumbar spine     Problem List Patient Active Problem List   Diagnosis Date Noted  . Skin tag of perianal region 02/05/2020  . Abnormal alkaline phosphatase test 10/31/2019  . Dilated bile duct ? (764m 10/31/2019  . Hx of adenomatous colonic polyps 09/11/2019  . Herpes zoster without complication 0980/16/5537. Poor appetite 01/17/2018  . Osteopenia 04/11/2017  . Acute right-sided low back pain without sciatica   . Shortness of breath   . Obesity 05/13/2015  . 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 Vaginal cyst 12/01/2014  . Hepatic cyst 07/06/2014  . Chronic hyponatremia 07/06/2014  . Adrenal mass, left ([place]01/17/2016  .  Encounter for chronic pain management 04/23/2014  . Headache 01/11/2014  . Carpal tunnel syndrome 10/01/2013  . Pain on swallowing 06/26/2013  . Pre-syncope 04/16/2013  . Left leg pain 01/03/2013  . GERD (gastroesophageal reflux disease) 01/03/2013  . Left hip pain 03/26/2012  . Insomnia 08/05/2011  . Constipation due to pain medication 08/05/2011  . Healthcare maintenance 04/08/2011  . Tooth pain 01/06/2011  . SCIATICA, LEFT 03/23/2010  . Asthma 11/23/2009  . Hyperlipidemia 03/12/2008  . Former smoker 09/02/2007  . HYPERTENSION, BENIGN SYSTEMIC 07/19/2006    PHYSICAL THERAPY DISCHARGE SUMMARY  Visits from Start of Care: 8  Current functional level related to goals / functional outcomes: See above   Remaining deficits: See above   Education / Equipment: HEP  Plan: Patient agrees to discharge.  Patient goals were met. Patient is being discharged due to meeting the stated rehab goals.  ?????       AlGar PontoS, PT 10/14/20 10:55 AM  [id number]: [phone] Fax:  [phone]: JuBLAIRE PALOMINORN: [id number] of Birth: 11/27/22/1952

## 2020-10-19 ENCOUNTER — Ambulatory Visit: Payer: Medicare HMO

## 2020-10-20 ENCOUNTER — Ambulatory Visit (INDEPENDENT_AMBULATORY_CARE_PROVIDER_SITE_OTHER): Payer: Medicare HMO

## 2020-10-20 ENCOUNTER — Other Ambulatory Visit: Payer: Self-pay

## 2020-10-20 DIAGNOSIS — Z23 Encounter for immunization: Secondary | ICD-10-CM

## 2020-10-21 ENCOUNTER — Ambulatory Visit: Payer: Medicare HMO | Admitting: Physical Therapy

## 2020-11-15 ENCOUNTER — Other Ambulatory Visit: Payer: Self-pay | Admitting: Family Medicine

## 2020-11-30 ENCOUNTER — Encounter: Payer: Self-pay | Admitting: Family Medicine

## 2020-11-30 ENCOUNTER — Other Ambulatory Visit: Payer: Self-pay

## 2020-11-30 ENCOUNTER — Ambulatory Visit (INDEPENDENT_AMBULATORY_CARE_PROVIDER_SITE_OTHER): Payer: Medicare HMO | Admitting: Family Medicine

## 2020-11-30 VITALS — BP 122/72 | HR 91 | Wt 157.6 lb

## 2020-11-30 DIAGNOSIS — M7989 Other specified soft tissue disorders: Secondary | ICD-10-CM | POA: Diagnosis not present

## 2020-11-30 DIAGNOSIS — M79651 Pain in right thigh: Secondary | ICD-10-CM | POA: Diagnosis not present

## 2020-11-30 MED ORDER — SHINGRIX 50 MCG/0.5ML IM SUSR: 0.5000 mL | Freq: Once | INTRAMUSCULAR | 1 refills | Status: AC

## 2020-11-30 MED ORDER — KETOROLAC TROMETHAMINE 30 MG/ML IJ SOLN
30.0000 mg | Freq: Once | INTRAMUSCULAR | Status: AC
  Administered 2020-11-30: 30 mg via INTRAMUSCULAR

## 2020-11-30 MED ORDER — TETANUS-DIPHTH-ACELL PERTUSSIS 5-2.5-18.5 LF-MCG/0.5 IM SUSY: 0.5000 mL | PREFILLED_SYRINGE | Freq: Once | INTRAMUSCULAR | 0 refills | Status: AC

## 2020-11-30 NOTE — Progress Notes (Signed)
  Date of Visit: 11/30/2020   SUBJECTIVE:   HPI:  Raven [name] presents today for follow up of right thigh pain.  Seen for this originally on 2/24, with pain in lateral right and posterior thigh. At that time had an area of about 5cm of possible adipose tissue predominance but thighs were equal in measurement. We elected to proceed with physical therapy at that visit, under the assumption that it was a muscle spasm in that area.  She has completed a course of physical therapy without any improvement. Has also been taking tramadol twice daily, as well as tylenol in the afternoon, and does not feel it is helping. Has noticed area on her right thigh is getting larger. Pain continues over lateral and posterior R thigh. Having trouble sleeping due to discomfort.  OBJECTIVE:   BP 122/72   Pulse 91   Wt 157 lb 9.6 oz (71.5 kg)   SpO2 100%   BMI 27.05 kg/m  Gen: no acute distress, pleasant, cooperative HEENT: normocephalic, atraumatic  Heart: regular rate and rhythm, no murmur Lungs: clear to auscultation bilaterally, normal work of breathing  Neuro: alert, speech normal, grossly nonfocal Ext: full strength bilateral lower extremities with hip adduction, abduction, and flexion. Full ROM of knees and ankles. R thigh has area of soft tissue swelling vs mass overlying lateral aspect of thigh, estimating about 10cm in size. This seems enlarged compared to last visit. No skin breakdown, discoloration, or warmth.  ASSESSMENT/PLAN:   Health maintenance:  -given rx for shingrix and Tdap, to get at her pharmacy -has DEXA scan scheduled in September -reviewed pack-year history with patient, she does not have 20 pack years and thus does not qualify for low density CT lung cancer screening  Painful mass in R thigh Appears enlarged on exam today - what previously seemed like soft tissue adiposity is more pronounced. Given her continued pain and its enlargement, I think MRI is prudent to rule out serious  conditions such as a sarcoma. Spoke with radiologist who recommended MRI femur with and without contrast. Order has been placed. Patient elected to continue on tramadol, will also dose toradol 30mg  IM here today. Follow up with me after MRI to review results. Patient agreeable with plan.  FOLLOW UP: Follow up in 1 month after MRI for R leg pain/mass.  [place]. [name], [place]

## 2020-11-30 NOTE — Patient Instructions (Signed)
Continue on the tramadol Blood pressure looks good  Take shingles prescription and Tdap prescription to your pharmacy  Getting MRI - we will call you with an appointment  Follow up with me in 1 month, sooner if needed  Be well, Dr. [name]

## 2020-12-20 ENCOUNTER — Other Ambulatory Visit: Payer: Self-pay

## 2020-12-20 ENCOUNTER — Ambulatory Visit
Admission: RE | Admit: 2020-12-20 | Discharge: 2020-12-20 | Disposition: A | Discharge status: Home / Self Care | Payer: Medicare HMO | Source: Ambulatory Visit | Attending: Family Medicine | Admitting: Family Medicine

## 2020-12-20 DIAGNOSIS — M7989 Other specified soft tissue disorders: Secondary | ICD-10-CM

## 2020-12-20 DIAGNOSIS — M1611 Unilateral primary osteoarthritis, right hip: Secondary | ICD-10-CM | POA: Diagnosis not present

## 2020-12-20 MED ORDER — GADOBENATE DIMEGLUMINE 529 MG/ML IV SOLN
14.0000 mL | Freq: Once | INTRAVENOUS | Status: AC | PRN
  Administered 2020-12-20: 14 mL via INTRAVENOUS

## 2021-01-05 ENCOUNTER — Encounter: Payer: Self-pay | Admitting: Family Medicine

## 2021-01-14 ENCOUNTER — Other Ambulatory Visit: Payer: Self-pay | Admitting: Family Medicine

## 2021-01-20 ENCOUNTER — Ambulatory Visit (INDEPENDENT_AMBULATORY_CARE_PROVIDER_SITE_OTHER): Payer: Medicare HMO | Admitting: Family Medicine

## 2021-01-20 ENCOUNTER — Other Ambulatory Visit: Payer: Self-pay

## 2021-01-20 VITALS — BP 119/80 | HR 98 | Ht 64.0 in | Wt 153.8 lb

## 2021-01-20 DIAGNOSIS — M79604 Pain in right leg: Secondary | ICD-10-CM | POA: Diagnosis not present

## 2021-01-20 DIAGNOSIS — R634 Abnormal weight loss: Secondary | ICD-10-CM | POA: Diagnosis not present

## 2021-01-20 MED ORDER — GABAPENTIN 300 MG PO CAPS: ORAL_CAPSULE | ORAL | 0 refills | Status: DC

## 2021-01-20 MED ORDER — KETOROLAC TROMETHAMINE 30 MG/ML IJ SOLN
30.0000 mg | Freq: Once | INTRAMUSCULAR | Status: AC
  Administered 2021-01-20: 30 mg via INTRAMUSCULAR

## 2021-01-20 NOTE — Patient Instructions (Addendum)
It was great to see you again today.  Start gabapentin. Begin with one pill ('300mg'$ ) three times daily. Can gradually increase by one additional pill per day until you are at 3 pills three times daily.   Checking lab work today Come see me in a few weeks as scheduled  Referring to orthopedic doctor  Be well, Dr. [name]

## 2021-01-20 NOTE — Progress Notes (Signed)
  Date of Visit: 01/20/2021   SUBJECTIVE:   HPI:  Raven [name] presents today for follow up of leg pain.  Underwent MRI R thigh on 8/1 to rule out presence of sarcoma or other mass, given fullness seen on R lateral thigh, with pain in this location. MRI was overall unremarkable per radiology read. Patient continues to have pain in this area, feels like it is radiating upward to R ASIS and also back into her buttock/down back of leg to knee. Has been taking tramadol '50mg'$  twice daily which is not helping at all, and desires to stop taking this. Previously took gabapentin for other types of pain which helped some but required higher doses.  She also reports decreased appetite for the last 2 months. Weight is down from 161lb in February, to 157.6lb in July, to 154lb today. No significant GI symptoms, just decreased appetite.  OBJECTIVE:   BP 119/80   Pulse 98   Ht '5\' 4"'$  (1.626 m)   Wt 153 lb 12.8 oz (69.8 kg)   SpO2 99%   BMI 26.40 kg/m  Gen: no acute distress, pleasant, cooperative HEENT: normocephalic, atraumatic. No anterior cervical or supraclavicular lymphadenopathy. Heart: regular rate and rhythm, no mur Lungs: clear to auscultation bilaterally, normal work of breathing  Lymph: no cervical, supraclavicular, axillary, or inguinal lymphadenopathy palpable Abdomen: soft, nontender to palpation, no masses or organomegaly Neuro: alert, speech normal, grossly nonfocal Ext: No appreciable lower extremity edema bilaterally. Continued predominance of R-sided increased fatty tissue over R lateral thigh compared to L side. Sensation and strength intact in bilateral lower extremities   ASSESSMENT/PLAN:   Health maintenance:  -has DEXA scan scheduled 9/30  R leg pain Stop tramadol as it is not helping MRI overall unremarkable - reassuring against sarcoma. Suspect perhaps sciatica contributing? Toradol shot today at patient request Will add gabapentin '300mg'$  three times daily, given instructions  on how to titrate upwards to up to '900mg'$  three times daily gradually. Referring to orthopedic specialist, prefers murphy wainer.  Unintentional weight loss Subtle, but present.  Will start with basic lab workup - CMET, CBC diff, HIV, TSH. Follow up in ~2 wks to recheck weight and decide on further workup.  [name]. Raven [name], Raven [name]

## 2021-02-02 DIAGNOSIS — M5441 Lumbago with sciatica, right side: Secondary | ICD-10-CM | POA: Diagnosis not present

## 2021-02-08 ENCOUNTER — Encounter: Payer: Self-pay | Admitting: Family Medicine

## 2021-02-08 ENCOUNTER — Ambulatory Visit (INDEPENDENT_AMBULATORY_CARE_PROVIDER_SITE_OTHER): Payer: Medicare HMO | Admitting: Family Medicine

## 2021-02-08 ENCOUNTER — Other Ambulatory Visit: Payer: Self-pay

## 2021-02-08 VITALS — BP 169/69 | HR 98 | Wt 162.6 lb

## 2021-02-08 DIAGNOSIS — Z23 Encounter for immunization: Secondary | ICD-10-CM | POA: Diagnosis not present

## 2021-02-08 DIAGNOSIS — I10 Essential (primary) hypertension: Secondary | ICD-10-CM

## 2021-02-08 DIAGNOSIS — R634 Abnormal weight loss: Secondary | ICD-10-CM

## 2021-02-08 DIAGNOSIS — M79651 Pain in right thigh: Secondary | ICD-10-CM | POA: Diagnosis not present

## 2021-02-08 MED ORDER — GABAPENTIN 300 MG PO CAPS: 900.0000 mg | ORAL_CAPSULE | Freq: Three times a day (TID) | ORAL | 1 refills | Status: DC

## 2021-02-08 MED ORDER — SHINGRIX 50 MCG/0.5ML IM SUSR: 0.5000 mL | Freq: Once | INTRAMUSCULAR | 1 refills | Status: AC

## 2021-02-08 MED ORDER — TETANUS-DIPHTH-ACELL PERTUSSIS 5-2.5-18.5 LF-MCG/0.5 IM SUSY: 0.5000 mL | PREFILLED_SYRINGE | Freq: Once | INTRAMUSCULAR | 0 refills | Status: AC

## 2021-02-08 NOTE — Progress Notes (Signed)
  Date of Visit: 02/08/2021   SUBJECTIVE:   HPI:  Raven [name] presents today for weight check.  Weight - up about 10lb since last visit. Is eating more, more aware of appetite and needing to eat regularly.   R thigh/buttock pain - reports she saw ortho who advised she return to PM&R for back injections, seems they believe this pain is mediated by lumbar nerve impingement. Patient has upcoming appointment.  Hypertension - currently taking lisinopril-HCTZ 10-12.5mg  daily (takes half of a 20-25mg  tablet). Blood pressure noted elevated today, patient believes due to being in pain.  OBJECTIVE:   BP (!) 169/69   Pulse 98   Wt 162 lb 9.6 oz (73.8 kg)   SpO2 100%   BMI 27.91 kg/m  Gen: no acute distress, pleasant cooperative HEENT: normocephalic, atraumatic  Heart: regular rate and rhythm, no murmur Lungs: clear to auscultation bilaterally, normal work of breathing  Neuro: alert, grossly nonfocal, speech normal  ASSESSMENT/PLAN:   Health maintenance:  -given rx for shingles and Tdap, advised to take to her pharmacy -high dose flu shot given today  HYPERTENSION, BENIGN SYSTEMIC Blood pressure noted high today, likely due to pain/stress of coming to appointment She will monitor at home and let me know if it continues to be high Otherwise will recheck at next visit in 1 month   Weight loss Weight fortunately increased at today's visit. Will defer further workup at this time given improvement since last visit. Patient agreeable.  R thigh pain Planning for PM&R injections. I will see her back after she gets her injection in a few weeks.  FOLLOW UP: Follow up in 1 mo for above issues  [place]. [name], [place]

## 2021-02-08 NOTE — Patient Instructions (Signed)
It was great to see you again today!  Take shingles and tetanus shot prescriptions to your pharmacy  Check blood pressure at home. If over 140/90 consistently please let me know. We will recheck next time I see you in October.  Be well, Dr. [name]

## 2021-02-13 DIAGNOSIS — Z23 Encounter for immunization: Secondary | ICD-10-CM | POA: Diagnosis not present

## 2021-02-13 NOTE — Assessment & Plan Note (Signed)
Blood pressure noted high today, likely due to pain/stress of coming to appointment She will monitor at home and let me know if it continues to be high Otherwise will recheck at next visit in 1 month

## 2021-02-18 ENCOUNTER — Ambulatory Visit
Admission: RE | Admit: 2021-02-18 | Discharge: 2021-02-18 | Disposition: A | Discharge status: Home / Self Care | Payer: Medicare HMO | Source: Ambulatory Visit | Attending: Family Medicine | Admitting: Family Medicine

## 2021-02-18 ENCOUNTER — Other Ambulatory Visit: Payer: Self-pay

## 2021-02-18 DIAGNOSIS — M8580 Other specified disorders of bone density and structure, unspecified site: Secondary | ICD-10-CM

## 2021-02-18 DIAGNOSIS — E2839 Other primary ovarian failure: Secondary | ICD-10-CM

## 2021-02-18 DIAGNOSIS — M8589 Other specified disorders of bone density and structure, multiple sites: Secondary | ICD-10-CM | POA: Diagnosis not present

## 2021-02-18 DIAGNOSIS — Z780 Asymptomatic menopausal state: Secondary | ICD-10-CM | POA: Diagnosis not present

## 2021-02-19 ENCOUNTER — Encounter: Payer: Self-pay | Admitting: Family Medicine

## 2021-02-22 DIAGNOSIS — Z6827 Body mass index (BMI) 27.0-27.9, adult: Secondary | ICD-10-CM | POA: Diagnosis not present

## 2021-02-22 DIAGNOSIS — M5416 Radiculopathy, lumbar region: Secondary | ICD-10-CM | POA: Diagnosis not present

## 2021-02-22 DIAGNOSIS — I10 Essential (primary) hypertension: Secondary | ICD-10-CM | POA: Diagnosis not present

## 2021-02-22 DIAGNOSIS — M48061 Spinal stenosis, lumbar region without neurogenic claudication: Secondary | ICD-10-CM | POA: Diagnosis not present

## 2021-03-04 DIAGNOSIS — M5416 Radiculopathy, lumbar region: Secondary | ICD-10-CM | POA: Diagnosis not present

## 2021-03-08 ENCOUNTER — Ambulatory Visit (INDEPENDENT_AMBULATORY_CARE_PROVIDER_SITE_OTHER): Payer: Medicare HMO | Admitting: Family Medicine

## 2021-03-08 ENCOUNTER — Other Ambulatory Visit: Payer: Self-pay

## 2021-03-08 ENCOUNTER — Encounter: Payer: Self-pay | Admitting: Family Medicine

## 2021-03-08 VITALS — BP 133/66 | HR 84 | Ht 64.0 in | Wt 162.2 lb

## 2021-03-08 DIAGNOSIS — I10 Essential (primary) hypertension: Secondary | ICD-10-CM

## 2021-03-08 DIAGNOSIS — J4520 Mild intermittent asthma, uncomplicated: Secondary | ICD-10-CM | POA: Diagnosis not present

## 2021-03-08 DIAGNOSIS — M8580 Other specified disorders of bone density and structure, unspecified site: Secondary | ICD-10-CM | POA: Diagnosis not present

## 2021-03-08 DIAGNOSIS — R634 Abnormal weight loss: Secondary | ICD-10-CM | POA: Diagnosis not present

## 2021-03-08 NOTE — Assessment & Plan Note (Signed)
Using albuterol slightly more frequently lately due to weather Advised if needing it more than current (3-4 times per week) to let me know, would add controller medication at that point

## 2021-03-08 NOTE — Patient Instructions (Addendum)
It was great to see you again today!  Checking vitamin D level today, along with calcium I will follow up with you after we get these labs back to tell you what dose of vitamin D and calcium to take.  Recommend you get the COVID booster  If needing albuterol more than you currently are, please let me know so we can get you on a controller inhaler  Follow up with me in March, sooner if needed.  Be well, Dr. [name]

## 2021-03-08 NOTE — Assessment & Plan Note (Signed)
Well controlled. Continue current medication regimen.  

## 2021-03-08 NOTE — Assessment & Plan Note (Signed)
Negative FRAX score but T score worsened compared to prior DEXA scan. Will check calcium/vitamin D levels today, which will guide supplementation plan I will follow up with patient by phone once levels have resulted

## 2021-03-08 NOTE — Progress Notes (Signed)
  Date of Visit: 03/08/2021   SUBJECTIVE:   HPI:  Raven [name] presents today for routine follow up.  Hypertension - taking lisinopril-HCTZ 20-25mg  half a pill daily. Does not check blood pressure at home. Initial blood pressure today is 152/80, improved to 133/66 on recheck.  Thigh pain - doing MUCH better after having an injection this past Friday. She is very pleased with this outcome.  Asthma - using albuterol inhaler 3-4 times per week lately due to the weather changes.  Osteopenia - recent DEXA with worsening of T score but remains in osteopenia category. Not currently on calcium of vitamin D supplements  OBJECTIVE:   BP 133/66   Pulse 84   Ht 5\' 4"  (1.626 m)   Wt 162 lb 3.2 oz (73.6 kg)   SpO2 99%   BMI 27.84 kg/m  Gen: no acute distress, pleasant cooperative HEENT: normocephalic, atraumatic  Heart: regular rate and rhythm, no murmur Lungs: clear to auscultation bilaterally, normal work of breathing  Neuro: alert, speech normal, grossly nonfocal, gait normal  ASSESSMENT/PLAN:   Health maintenance:  -declines COVID booster today but plans to get it another time -declines shingrix rx today  Osteopenia Negative FRAX score but T score worsened compared to prior DEXA scan. Will check calcium/vitamin D levels today, which will guide supplementation plan I will follow up with patient by phone once levels have resulted  Asthma Using albuterol slightly more frequently lately due to weather Advised if needing it more than current (3-4 times per week) to let me know, would add controller medication at that point  HYPERTENSION, BENIGN SYSTEMIC Well controlled. Continue current medication regimen.   Back/thigh pain - improved greatly after seeing PM&R and getting injection, continue to follow up with them  FOLLOW UP: Follow up in March with me for routine medical issues  [place]. [name], [place]

## 2021-03-09 LAB — CBC WITH DIFFERENTIAL/PLATELET
Basophils Absolute: 0 10*3/uL (ref 0.0–0.2)
Basos: 0 %
EOS (ABSOLUTE): 0.2 10*3/uL (ref 0.0–0.4)
Eos: 2 %
Hematocrit: 35.6 % (ref 34.0–46.6)
Hemoglobin: 12.3 g/dL (ref 11.1–15.9)
Immature Grans (Abs): 0 10*3/uL (ref 0.0–0.1)
Immature Granulocytes: 1 %
Lymphocytes Absolute: 1.8 10*3/uL (ref 0.7–3.1)
Lymphs: 25 %
MCH: 31.4 pg (ref 26.6–33.0)
MCHC: 34.6 g/dL (ref 31.5–35.7)
MCV: 91 fL (ref 79–97)
Monocytes Absolute: 0.6 10*3/uL (ref 0.1–0.9)
Monocytes: 8 %
Neutrophils Absolute: 4.6 10*3/uL (ref 1.4–7.0)
Neutrophils: 64 %
Platelets: 340 10*3/uL (ref 150–450)
RBC: 3.92 x10E6/uL (ref 3.77–5.28)
RDW: 12.9 % (ref 11.7–15.4)
WBC: 7.3 10*3/uL (ref 3.4–10.8)

## 2021-03-09 LAB — CMP14+EGFR
ALT: 20 IU/L (ref 0–32)
AST: 21 IU/L (ref 0–40)
Albumin/Globulin Ratio: 1.4 (ref 1.2–2.2)
Albumin: 5.1 g/dL — ABNORMAL HIGH (ref 3.7–4.7)
Alkaline Phosphatase: 129 IU/L — ABNORMAL HIGH (ref 44–121)
BUN/Creatinine Ratio: 15 (ref 12–28)
BUN: 14 mg/dL (ref 8–27)
Bilirubin Total: <0.2 mg/dL (ref 0.0–1.2)
CO2: 18 mmol/L — ABNORMAL LOW (ref 20–29)
Calcium: 10.4 mg/dL — ABNORMAL HIGH (ref 8.7–10.3)
Chloride: 102 mmol/L (ref 96–106)
Creatinine, Ser: 0.96 mg/dL (ref 0.57–1.00)
Globulin, Total: 3.7 g/dL (ref 1.5–4.5)
Glucose: 92 mg/dL (ref 70–99)
Potassium: 4.4 mmol/L (ref 3.5–5.2)
Sodium: 139 mmol/L (ref 134–144)
Total Protein: 8.8 g/dL — ABNORMAL HIGH (ref 6.0–8.5)
eGFR: 63 mL/min/{1.73_m2} (ref >59)

## 2021-03-09 LAB — HIV ANTIBODY (ROUTINE TESTING W REFLEX): HIV Screen 4th Generation wRfx: NONREACTIVE

## 2021-03-09 LAB — TSH: TSH: 0.713 u[IU]/mL (ref 0.450–4.500)

## 2021-03-14 LAB — VITAMIN D 25 HYDROXY (VIT D DEFICIENCY, FRACTURES)

## 2021-03-14 LAB — BASIC METABOLIC PANEL
BUN/Creatinine Ratio: 13 (ref 12–28)
BUN: 13 mg/dL (ref 8–27)
CO2: 15 mmol/L — ABNORMAL LOW (ref 20–29)
Calcium: 10.6 mg/dL — ABNORMAL HIGH (ref 8.7–10.3)
Chloride: 102 mmol/L (ref 96–106)
Creatinine, Ser: 1.01 mg/dL — ABNORMAL HIGH (ref 0.57–1.00)
Glucose: 91 mg/dL (ref 70–99)
Potassium: 4.5 mmol/L (ref 3.5–5.2)
Sodium: 138 mmol/L (ref 134–144)
eGFR: 60 mL/min/{1.73_m2} (ref >59)

## 2021-03-24 ENCOUNTER — Other Ambulatory Visit: Payer: Self-pay

## 2021-03-28 MED ORDER — IBUPROFEN 800 MG PO TABS: 800.0000 mg | ORAL_TABLET | Freq: Three times a day (TID) | ORAL | 0 refills | Status: DC | PRN

## 2021-03-30 ENCOUNTER — Telehealth: Payer: Self-pay

## 2021-03-30 NOTE — Telephone Encounter (Signed)
Patient calls nurse line regarding daughter testing positive for COVID today. Daughter lives with mother currently. Patient is currently asymptomatic. Provided with CDC recommended precautions. Encouraged wearing a mask when around others, good handwashing and cleaning of commonly used surfaces.   Patient will return call to office if she becomes symptomatic or tests positive.   Talbot Grumbling, RN

## 2021-04-01 NOTE — Telephone Encounter (Signed)
Noted, agree Leeanne Rio, MD

## 2021-04-13 ENCOUNTER — Telehealth: Payer: Self-pay | Admitting: Family Medicine

## 2021-04-13 DIAGNOSIS — M8580 Other specified disorders of bone density and structure, unspecified site: Secondary | ICD-10-CM

## 2021-04-13 NOTE — Telephone Encounter (Signed)
Called patient to discuss labs from last visit Vit D unfortunately not able to be run; reason why is unclear to me. Result had said patient was being contacted with recollection instructions but according to patient that never happened  Asked her to schedule lab visit for next week to recheck Vit D and also recheck calcium level, which was a bit high (may be due to HCTZ). Those results will guide further management of her osteopenia.  Patient appreciative. She will call to schedule lab visit (did not want to schedule at this time because her sister is in the hospital).  Leeanne Rio, MD

## 2021-04-20 ENCOUNTER — Other Ambulatory Visit: Payer: Self-pay | Admitting: Family Medicine

## 2021-04-25 ENCOUNTER — Other Ambulatory Visit: Payer: Self-pay | Admitting: Family Medicine

## 2021-04-25 DIAGNOSIS — Z1231 Encounter for screening mammogram for malignant neoplasm of breast: Secondary | ICD-10-CM

## 2021-05-03 ENCOUNTER — Ambulatory Visit (INDEPENDENT_AMBULATORY_CARE_PROVIDER_SITE_OTHER): Payer: Medicare HMO

## 2021-05-03 ENCOUNTER — Other Ambulatory Visit: Payer: Medicare HMO

## 2021-05-03 ENCOUNTER — Other Ambulatory Visit: Payer: Self-pay

## 2021-05-03 DIAGNOSIS — Z23 Encounter for immunization: Secondary | ICD-10-CM | POA: Diagnosis not present

## 2021-05-03 DIAGNOSIS — M8580 Other specified disorders of bone density and structure, unspecified site: Secondary | ICD-10-CM

## 2021-05-04 ENCOUNTER — Encounter: Payer: Self-pay | Admitting: Family Medicine

## 2021-05-04 LAB — BASIC METABOLIC PANEL
BUN/Creatinine Ratio: 15 (ref 12–28)
BUN: 15 mg/dL (ref 8–27)
CO2: 19 mmol/L — ABNORMAL LOW (ref 20–29)
Calcium: 10 mg/dL (ref 8.7–10.3)
Chloride: 99 mmol/L (ref 96–106)
Creatinine, Ser: 1 mg/dL (ref 0.57–1.00)
Glucose: 88 mg/dL (ref 70–99)
Potassium: 4.6 mmol/L (ref 3.5–5.2)
Sodium: 135 mmol/L (ref 134–144)
eGFR: 60 mL/min/{1.73_m2} (ref >59)

## 2021-05-04 LAB — VITAMIN D 25 HYDROXY (VIT D DEFICIENCY, FRACTURES): Vit D, 25-Hydroxy: 38.5 ng/mL (ref 30.0–100.0)

## 2021-06-05 ENCOUNTER — Other Ambulatory Visit: Payer: Self-pay | Admitting: Family Medicine

## 2021-06-07 ENCOUNTER — Other Ambulatory Visit: Payer: Self-pay | Admitting: Family Medicine

## 2021-06-07 ENCOUNTER — Ambulatory Visit
Admission: RE | Admit: 2021-06-07 | Discharge: 2021-06-07 | Disposition: A | Discharge status: Home / Self Care | Payer: Medicare HMO | Source: Ambulatory Visit | Attending: Family Medicine | Admitting: Family Medicine

## 2021-06-07 DIAGNOSIS — Z1231 Encounter for screening mammogram for malignant neoplasm of breast: Secondary | ICD-10-CM | POA: Diagnosis not present

## 2021-06-08 ENCOUNTER — Ambulatory Visit: Payer: Medicare HMO

## 2021-07-23 ENCOUNTER — Other Ambulatory Visit: Payer: Self-pay | Admitting: Family Medicine

## 2021-08-12 ENCOUNTER — Other Ambulatory Visit: Payer: Self-pay | Admitting: Family Medicine

## 2021-09-08 ENCOUNTER — Other Ambulatory Visit: Payer: Self-pay | Admitting: Family Medicine

## 2021-09-08 MED ORDER — GABAPENTIN 300 MG PO CAPS: 900.0000 mg | ORAL_CAPSULE | Freq: Three times a day (TID) | ORAL | 0 refills | Status: DC

## 2021-09-08 MED ORDER — GABAPENTIN 300 MG PO CAPS: 900.0000 mg | ORAL_CAPSULE | Freq: Three times a day (TID) | ORAL | 1 refills | Status: DC

## 2021-10-17 ENCOUNTER — Other Ambulatory Visit: Payer: Self-pay | Admitting: Family Medicine

## 2021-11-26 ENCOUNTER — Other Ambulatory Visit: Payer: Self-pay | Admitting: Family Medicine

## 2021-11-29 NOTE — Telephone Encounter (Signed)
Please let patient know I am refilling this medication, but she needs to schedule an appointment with me.   Thanks, Kalany Diekmann J Myalee Stengel, MD  

## 2021-12-26 ENCOUNTER — Ambulatory Visit (INDEPENDENT_AMBULATORY_CARE_PROVIDER_SITE_OTHER): Payer: Medicare HMO | Admitting: Family Medicine

## 2021-12-26 ENCOUNTER — Encounter: Payer: Self-pay | Admitting: Family Medicine

## 2021-12-26 VITALS — BP 128/80 | HR 91 | Ht 64.0 in | Wt 165.6 lb

## 2021-12-26 DIAGNOSIS — G8929 Other chronic pain: Secondary | ICD-10-CM | POA: Diagnosis not present

## 2021-12-26 DIAGNOSIS — E785 Hyperlipidemia, unspecified: Secondary | ICD-10-CM

## 2021-12-26 DIAGNOSIS — M8580 Other specified disorders of bone density and structure, unspecified site: Secondary | ICD-10-CM

## 2021-12-26 DIAGNOSIS — J4520 Mild intermittent asthma, uncomplicated: Secondary | ICD-10-CM

## 2021-12-26 DIAGNOSIS — I10 Essential (primary) hypertension: Secondary | ICD-10-CM

## 2021-12-26 MED ORDER — SHINGRIX 50 MCG/0.5ML IM SUSR: INTRAMUSCULAR | 1 refills | Status: DC

## 2021-12-26 MED ORDER — DULOXETINE HCL 30 MG PO CPEP: 30.0000 mg | ORAL_CAPSULE | Freq: Every day | ORAL | 0 refills | Status: DC

## 2021-12-26 NOTE — Patient Instructions (Addendum)
It was great to see you again today!  Checking cholesterol today  Work on remaining active  [place] to go up to 2000 units of vitamin D daily (2 pills)  Start duloxetine '30mg'$  daily. Let me know if you want to go up on the dose before October Otherwise I will see you then  Be well, Dr. [name]

## 2021-12-26 NOTE — Assessment & Plan Note (Signed)
Ongoing pain, chronic issue, no red flags.  Discussed trial of cymbalta and patient is agreeable. rx cymbalta '30mg'$  daily, with plan to increase to '60mg'$  daily at next visit in 2 months if tolerating well Advised we could go up sooner if she desires, she will let me know Continue gabapentin '900mg'$  three times daily.  Follow up in October.

## 2021-12-26 NOTE — Assessment & Plan Note (Signed)
Advised can increase vit D3 supplementation to 2000 IU daily

## 2021-12-26 NOTE — Assessment & Plan Note (Signed)
Had planned to check lipids today but patient had to leave, will defer to next visit. Continue statin.

## 2021-12-26 NOTE — Progress Notes (Signed)
  Date of Visit: 12/26/2021   SUBJECTIVE:   HPI:  R[place] [name] presents today for routine follow up.  Hypertension: Currently taking lisinopril-HCTZ 20-25 half of a pill daily.  Tolerating this medication well without any issues.  Asthma: Using albuterol as needed.  Lately has been using it about 3 times a week due to weather changes.  Constipation: Using MiraLAX as needed, states this works well for her  Osteopenia: Taking vitamin D3 1000 IU daily.  She boought this over-the-counter.  She also takes a multivitamin (One-A-Day)  Back pain: Currently taking gabapentin 900 mg 3 times a day.  While this medication helps, it is not relieving her symptoms altogether.  She would like a new additional pain medication that is not habit-forming.  Has never tried duloxetine in the past.  Denies any mental health history.  Has pain in right lower back radiating down into right posterior thigh.  Previously had injections by Dr. Brien Few but states he has moved to a new practice and she does not want to get injections again at this time.  Hyperlipidemia: Taking atorvastatin 40 mg daily.  She is fasting today but is not able to stay to get her labs drawn as she needs to go.  OBJECTIVE:   BP 128/80   Pulse 91   Ht '5\' 4"'$  (1.626 m)   Wt 165 lb 9.6 oz (75.1 kg)   SpO2 99%   BMI 28.43 kg/m  Gen: no acute distress, pleasant, cooperative HEENT: normocephalic, atraumatic  Heart: regular rate and rhythm, no murmur Lungs: clear to auscultation bilaterally, normal work of breathing  Neuro: alert, speech normal, grossly nonfocal Ext: No appreciable lower extremity edema bilaterally. R low back tender to palpation in paraspinal musculature. Sensation intact over bilateral lower extremities. Full strength with hip flexion, abduction, abduction bilaterally  ASSESSMENT/PLAN:   Health maintenance:  -rx given for shingrix  Asthma Adequate control with as needed albuterol.  Continue this as  needed.  Hyperlipidemia Had planned to check lipids today but patient had to leave, will defer to next visit. Continue statin.  HYPERTENSION, BENIGN SYSTEMIC Well controlled. Continue current medication regimen.   Osteopenia Advised can increase vit D3 supplementation to 2000 IU daily  Encounter for chronic pain management Ongoing pain, chronic issue, no red flags.  Discussed trial of cymbalta and patient is agreeable. rx cymbalta '30mg'$  daily, with plan to increase to '60mg'$  daily at next visit in 2 months if tolerating well Advised we could go up sooner if she desires, she will let me know Continue gabapentin '900mg'$  three times daily.  Follow up in October.  Constipation Well controlled on as needed miralax  FOLLOW UP: Follow up in October for back pain  [place] J. [name], [place]

## 2021-12-26 NOTE — Assessment & Plan Note (Signed)
Adequate control with as needed albuterol.  Continue this as needed.

## 2021-12-26 NOTE — Assessment & Plan Note (Signed)
Well controlled. Continue current medication regimen.  

## 2022-01-20 ENCOUNTER — Other Ambulatory Visit: Payer: Self-pay | Admitting: *Deleted

## 2022-01-24 MED ORDER — ATORVASTATIN CALCIUM 40 MG PO TABS: 40.0000 mg | ORAL_TABLET | Freq: Every day | ORAL | 1 refills | Status: DC

## 2022-02-07 DIAGNOSIS — H2513 Age-related nuclear cataract, bilateral: Secondary | ICD-10-CM | POA: Diagnosis not present

## 2022-02-07 DIAGNOSIS — H43393 Other vitreous opacities, bilateral: Secondary | ICD-10-CM | POA: Diagnosis not present

## 2022-02-07 DIAGNOSIS — H524 Presbyopia: Secondary | ICD-10-CM | POA: Diagnosis not present

## 2022-02-23 ENCOUNTER — Ambulatory Visit (INDEPENDENT_AMBULATORY_CARE_PROVIDER_SITE_OTHER): Payer: Medicare HMO | Admitting: Student

## 2022-02-23 VITALS — BP 132/60 | HR 78 | Wt 167.0 lb

## 2022-02-23 DIAGNOSIS — R0602 Shortness of breath: Secondary | ICD-10-CM

## 2022-02-23 DIAGNOSIS — J4521 Mild intermittent asthma with (acute) exacerbation: Secondary | ICD-10-CM

## 2022-02-23 MED ORDER — PREDNISONE 20 MG PO TABS: 40.0000 mg | ORAL_TABLET | Freq: Every day | ORAL | 0 refills | Status: AC

## 2022-02-23 MED ORDER — FLUTICASONE-SALMETEROL 100-50 MCG/ACT IN AEPB: 1.0000 | INHALATION_SPRAY | Freq: Two times a day (BID) | RESPIRATORY_TRACT | 3 refills | Status: DC

## 2022-02-23 NOTE — Assessment & Plan Note (Signed)
Patient presents with a week of cough and wheezing, noting that her albuterol isn't working. She denies any systemic symptoms (fever, nausea, vomiting, diarrhea). Patient appears to have an asthma exacerbation that has been triggered by an URI, given symptoms and congestion. Patient with mild diffuse wheezing on exam and good WOB. Patient would benefit from course of steroids and starting daily controller. Given symptoms can't rule out pneumonia, will also get chest x-ray.  -Prednisone 40 mg x 5 days -Advair 100-50 daily -CXR

## 2022-02-23 NOTE — Patient Instructions (Signed)
It was great to see you! Thank you for allowing me to participate in your care!  It looks like you are having an asthma exacerbation, but we want to be sure you don't have a pneumonia.   Our plans for today:  - Asthma  Prednisone 40 mg, for 5 days  Albuterol inhaler as needed (4 puff every 2 hours as needed)  Advair daily inhaler   Start taking every day (1 puff, twice a day) If shortness of breath continues and does not respond to albuterol, seek immediate medical care  - Pneumonia  Chest X-Ray at ([facility]) If you develop fevers, worsening shortness of breath, chest pain, worsening cough, make appointment to be seen or seek urgent medical care.   Take care and seek immediate care sooner if you develop any concerns.   Dr. Holley Bouche, MD [place]

## 2022-02-23 NOTE — Progress Notes (Signed)
pred SUBJECTIVE:   CHIEF COMPLAINT / HPI:   Asthma exacerbation, takes albuterol  Cough and wheezing, albuterol isn't working. Symptoms started last Friday, with off and on breathing issues. Albuterol was helping, but not anymore, appreciates audible wheezing. Denies any fever, but complains of congestion. No nausea, vomiting, diarrhea. Also appreciates loss of appetite.   Notes that it feels like someone is stepping on her chest when her asthma get's flared.   PERTINENT  PMH / PSH: Asthma   OBJECTIVE:  BP 132/60   Pulse 78   Wt 167 lb (75.8 kg)   SpO2 98%   BMI 28.67 kg/m   General: NAD, pleasant, able to participate in exam Cardiac: RRR, no murmurs auscultated. Respiratory: Mild wheezing and coarse breath sounds diffusely, normal effort, no rales or rhonchi Extremities: warm and well perfused, no edema or cyanosis. Skin: warm and dry, no rashes noted Neuro: alert, no obvious focal deficits, speech normal Psych: Normal affect and mood  ASSESSMENT/PLAN:  Asthma Patient presents with a week of cough and wheezing, noting that her albuterol isn't working. She denies any systemic symptoms (fever, nausea, vomiting, diarrhea). Patient appears to have an asthma exacerbation that has been triggered by an URI, given symptoms and congestion. Patient with mild diffuse wheezing on exam and good WOB. Patient would benefit from course of steroids and starting daily controller. Given symptoms can't rule out pneumonia, will also get chest x-ray.  -Prednisone 40 mg x 5 days -Advair 100-50 daily -CXR   Orders Placed This Encounter  Procedures   DG Chest 2 View    Standing Status:   Future    Standing Expiration Date:   02/24/2023    Order Specific Question:   Reason for Exam (SYMPTOM  OR DIAGNOSIS REQUIRED)    Answer:   Shortness of breath    Order Specific Question:   Preferred imaging location?    Answer:   [id number]   Meds ordered this encounter  Medications   predniSONE  (DELTASONE) 20 MG tablet    Sig: Take 2 tablets (40 mg total) by mouth daily with breakfast for 5 days.    Dispense:  10 tablet    Refill:  0   fluticasone-salmeterol (ADVAIR) 100-50 MCG/ACT AEPB    Sig: Inhale 1 puff into the lungs 2 (two) times daily.    Dispense:  1 each    Refill:  3   No follow-ups on file. '@SIGNNOTE'$ @

## 2022-03-02 ENCOUNTER — Ambulatory Visit: Payer: Medicare HMO

## 2022-03-21 ENCOUNTER — Other Ambulatory Visit: Payer: Self-pay | Admitting: Family Medicine

## 2022-03-30 ENCOUNTER — Telehealth: Payer: Self-pay | Admitting: Family Medicine

## 2022-03-30 NOTE — Telephone Encounter (Signed)
Called patient to give condolences on the loss of her daughter, Marita [place], who was shot and killed over the weekend. Patient seems to be coping fairly well. The funeral is 11/18. Anokhi is helping take care of Shaneta's infant, Izell Schuyler.   We discussed on the phone that she can start the cymbalta I previously prescribed for her, to help with pain. She had not started it after seeing that it is an antidepressant. Reassured her it will not cause her to be depressed, but should hopefully help her pain.  I worked her in to see me on 11/20.   Patient appreciative of call.  Leeanne Rio, MD

## 2022-04-02 ENCOUNTER — Other Ambulatory Visit: Payer: Self-pay | Admitting: Family Medicine

## 2022-04-05 ENCOUNTER — Ambulatory Visit: Payer: Medicare HMO | Admitting: Student

## 2022-04-10 ENCOUNTER — Ambulatory Visit (INDEPENDENT_AMBULATORY_CARE_PROVIDER_SITE_OTHER): Payer: Medicare HMO | Admitting: Family Medicine

## 2022-04-10 ENCOUNTER — Encounter: Payer: Self-pay | Admitting: Family Medicine

## 2022-04-10 ENCOUNTER — Other Ambulatory Visit: Payer: Self-pay

## 2022-04-10 VITALS — BP 148/77 | HR 91 | Wt 158.6 lb

## 2022-04-10 DIAGNOSIS — Z23 Encounter for immunization: Secondary | ICD-10-CM | POA: Diagnosis not present

## 2022-04-10 DIAGNOSIS — R21 Rash and other nonspecific skin eruption: Secondary | ICD-10-CM

## 2022-04-10 DIAGNOSIS — I10 Essential (primary) hypertension: Secondary | ICD-10-CM | POA: Diagnosis not present

## 2022-04-10 DIAGNOSIS — E785 Hyperlipidemia, unspecified: Secondary | ICD-10-CM

## 2022-04-10 DIAGNOSIS — G8929 Other chronic pain: Secondary | ICD-10-CM | POA: Diagnosis not present

## 2022-04-10 MED ORDER — HYDROCORTISONE 1 % EX OINT: 1.0000 | TOPICAL_OINTMENT | Freq: Two times a day (BID) | CUTANEOUS | 0 refills | Status: DC

## 2022-04-10 MED ORDER — KETOROLAC TROMETHAMINE 30 MG/ML IJ SOLN
30.0000 mg | Freq: Once | INTRAMUSCULAR | Status: AC
  Administered 2022-04-10: 30 mg via INTRAMUSCULAR

## 2022-04-10 MED ORDER — SHINGRIX 50 MCG/0.5ML IM SUSR: INTRAMUSCULAR | 1 refills | Status: DC

## 2022-04-10 NOTE — Progress Notes (Unsigned)
  Date of Visit: 04/10/2022   SUBJECTIVE:   HPI:  Raven [name] presents today for routine follow-up.  Chronic pain: Continues to have pain in her back going down into her legs.  She has been taking gabapentin 900 mg in the morning and 900 mg in the evening.  This helps some.  She also takes ibuprofen occasionally.  I had previously prescribed her duloxetine to help with pain, but she has not taken this medication.  She is afraid it will cause her to have depression as she noticed that it is listed as an antidepressant.  She would like a Toradol shot today.  Grief: Her daughter was recently shot and killed.  The funeral was this past weekend.  She reports she is coping well with this.  Has not cried at all yet.  She prefers to remain strong and not dwell on her sadness.  She has been helping to care for her daughter's infant son, Raven [name].  Reports having decreased appetite.  Because she has had a decreased appetite, she has not taken her blood pressure medications today.  Has had a lot of family in town, and they are supporting one another.  She is drinking water to stay hydrated.  Hyperlipidemia: Currently taking atorvastatin 40 mg daily.  Tolerating this well.  Rash: Has itchy rash on her arms and legs after petting a dog that has fleas.  She would like a cream to help with this.  Is not getting any new bites.  OBJECTIVE:   BP (!) 148/77   Pulse 91   Wt 158 lb 9.6 oz (71.9 kg)   SpO2 100%   BMI 27.22 kg/m  Gen: no acute distress, cooperative HEENT: normocephalic, atraumatic  Heart: regular rate and rhythm Lungs: clear to auscultation bilaterally, normal work of breathing  Neuro: alert, speech normal, nonfocal. Normal gait. Psych: appears tired, affect slightly flat but full range.  ASSESSMENT/PLAN:   Health maintenance:  -Flu shot given today -Given prescription for shingles vaccine to take to her pharmacy  Rash Consistent with fleabites.  Prescribed hydrocortisone ointment 1% for  her to use.  A follow-up scheduled in several weeks to recheck at her request.  Grief Normalized feelings of sadness due to the tragic loss of her daughter.  Encouraged her to allow herself to grieve.  She prefers to not focus on being sad.  I will continue to check in with her at future visits.  Encounter for chronic pain management Has not started duloxetine.  Discussed that this will not make her depressed, reviewed uses of antidepressant medication for other indications.  Despite this discussion today, patient did not want to begin this medicine.  Instead we will do a Toradol shot today and she will continue her gabapentin.  Hyperlipidemia Update lipids today.  Continue statin.  HYPERTENSION, BENIGN SYSTEMIC BP up but did not take medications today.  We will recheck this when I see her in about a month.  FOLLOW UP: Follow up in December as scheduled for rash/BP  [place] J. [name], [place]

## 2022-04-10 NOTE — Patient Instructions (Signed)
It was great to see you again today!  Follow up in a few weeks as scheduled to recheck your rash and weight Toradol shot today Flu shot today  Take shingles vaccine prescription to your pharmacy   Be well, Dr. [name]

## 2022-04-11 ENCOUNTER — Other Ambulatory Visit: Payer: Self-pay | Admitting: Family Medicine

## 2022-04-11 DIAGNOSIS — Z1231 Encounter for screening mammogram for malignant neoplasm of breast: Secondary | ICD-10-CM

## 2022-04-11 LAB — BASIC METABOLIC PANEL
BUN/Creatinine Ratio: 14 (ref 12–28)
BUN: 20 mg/dL (ref 8–27)
CO2: 23 mmol/L (ref 20–29)
Calcium: 9.9 mg/dL (ref 8.7–10.3)
Chloride: 96 mmol/L (ref 96–106)
Creatinine, Ser: 1.42 mg/dL — ABNORMAL HIGH (ref 0.57–1.00)
Glucose: 97 mg/dL (ref 70–99)
Potassium: 4.6 mmol/L (ref 3.5–5.2)
Sodium: 133 mmol/L — ABNORMAL LOW (ref 134–144)
eGFR: 39 mL/min/{1.73_m2} — ABNORMAL LOW (ref >59)

## 2022-04-11 LAB — LIPID PANEL
Chol/HDL Ratio: 2.7 ratio (ref 0.0–4.4)
Cholesterol, Total: 142 mg/dL (ref 100–199)
HDL: 52 mg/dL (ref >39)
LDL Chol Calc (NIH): 69 mg/dL (ref 0–99)
Triglycerides: 114 mg/dL (ref 0–149)
VLDL Cholesterol Cal: 21 mg/dL (ref 5–40)

## 2022-04-11 NOTE — Assessment & Plan Note (Signed)
BP up but did not take medications today.  We will recheck this when I see her in about a month.

## 2022-04-11 NOTE — Assessment & Plan Note (Signed)
Update lipids today.  Continue statin.

## 2022-04-11 NOTE — Assessment & Plan Note (Signed)
Has not started duloxetine.  Discussed that this will not make her depressed, reviewed uses of antidepressant medication for other indications.  Despite this discussion today, patient did not want to begin this medicine.  Instead we will do a Toradol shot today and she will continue her gabapentin.

## 2022-04-19 ENCOUNTER — Other Ambulatory Visit: Payer: Self-pay | Admitting: Family Medicine

## 2022-05-04 ENCOUNTER — Encounter: Payer: Self-pay | Admitting: Family Medicine

## 2022-05-04 ENCOUNTER — Ambulatory Visit (INDEPENDENT_AMBULATORY_CARE_PROVIDER_SITE_OTHER): Payer: Medicare HMO | Admitting: Family Medicine

## 2022-05-04 VITALS — BP 142/80 | HR 73 | Temp 98.7°F | Wt 159.8 lb

## 2022-05-04 DIAGNOSIS — R7989 Other specified abnormal findings of blood chemistry: Secondary | ICD-10-CM | POA: Diagnosis not present

## 2022-05-04 DIAGNOSIS — I10 Essential (primary) hypertension: Secondary | ICD-10-CM

## 2022-05-04 DIAGNOSIS — Z23 Encounter for immunization: Secondary | ICD-10-CM

## 2022-05-04 DIAGNOSIS — G8929 Other chronic pain: Secondary | ICD-10-CM

## 2022-05-04 MED ORDER — GABAPENTIN 300 MG PO CAPS: 900.0000 mg | ORAL_CAPSULE | Freq: Three times a day (TID) | ORAL | 2 refills | Status: DC

## 2022-05-04 MED ORDER — BACLOFEN 10 MG PO TABS: 5.0000 mg | ORAL_TABLET | Freq: Two times a day (BID) | ORAL | 0 refills | Status: DC | PRN

## 2022-05-04 MED ORDER — GABAPENTIN 300 MG PO CAPS: 900.0000 mg | ORAL_CAPSULE | Freq: Three times a day (TID) | ORAL | 0 refills | Status: DC

## 2022-05-04 NOTE — Assessment & Plan Note (Signed)
Pain overall stable.  Sent in baclofen for her to try.

## 2022-05-04 NOTE — Patient Instructions (Signed)
It was great to see you again today!  Blood pressure is up but probably because you didn't take medication yet today We will recheck next time we see you  Checking labs today  Refilled gabapentin Will refer to dermatologist  Be well, Dr. [name]

## 2022-05-04 NOTE — Assessment & Plan Note (Signed)
Blood pressure elevated, likely due to not taking medications yet today.  We will recheck at next visit.

## 2022-05-04 NOTE — Progress Notes (Signed)
  Date of Visit: 05/04/2022   SUBJECTIVE:   HPI:  Raven [name] presents today for follow up of rash and to get COVID vaccine.  Hypertension -currently taking lisinopril-HCTZ 20-25 mg half of a pill every day.  She did not take her blood pressure medication yet this morning.  Elevated creatinine: Previously we had drawn a BMP and her creatinine was elevated.  She had not been eating and drinking much at that time after the death of her daughter.  She has been doing better with taking fluids and food by mouth.  She would like to have her renal function rechecked today.  Rash: Her last visit had fleabites and was given hydrocortisone ointment.  Overall spots are improving however she has some post inflammatory hyperpigmentation that she would like to be gone.  She request referral to dermatology.  Grief: Reports she continues to suppress feelings of grief after the tragic death of her daughter.  She says she is overall doing okay.  Has kept herself busy taking care of her grandson, Raven [name].  Back pain: Has ongoing chronic back pain that is not worsened or changed.  She would like to try a muscle relaxer for this.  She is interested in baclofen.  OBJECTIVE:   BP (!) 142/80   Pulse 73   Temp 98.7 F (37.1 C)   Wt 159 lb 12.8 oz (72.5 kg)   SpO2 100%   BMI 27.43 kg/m  Gen: no acute distress, pleasant, cooperative HEENT: normocephalic, atraumatic  Heart: regular rate and rhythm Lungs: clear to auscultation bilaterally, normal work of breathing  Neuro: alert, speech normal, grossly nonfocal  ASSESSMENT/PLAN:   Health maintenance:  -COVID vaccine given today -reminded to get shingrix  Postinflammatory hyperpigmentation Advised this will likely go away with time.  Will refer to dermatology at her request.  She is aware there is a long wait for dermatology appointments right now.  Elevated creatinine Update BMP today.  Grief Encouraged her to not suppress her feelings, and to lean on  those around her and take care of herself.  HYPERTENSION, BENIGN SYSTEMIC Blood pressure elevated, likely due to not taking medications yet today.  We will recheck at next visit.  Encounter for chronic pain management Pain overall stable.  Sent in baclofen for her to try.  [place]. [name], [place]

## 2022-05-05 ENCOUNTER — Encounter: Payer: Self-pay | Admitting: Family Medicine

## 2022-05-05 LAB — BASIC METABOLIC PANEL
BUN/Creatinine Ratio: 15 (ref 12–28)
BUN: 15 mg/dL (ref 8–27)
CO2: 23 mmol/L (ref 20–29)
Calcium: 9.6 mg/dL (ref 8.7–10.3)
Chloride: 100 mmol/L (ref 96–106)
Creatinine, Ser: 0.99 mg/dL (ref 0.57–1.00)
Glucose: 98 mg/dL (ref 70–99)
Potassium: 4.7 mmol/L (ref 3.5–5.2)
Sodium: 136 mmol/L (ref 134–144)
eGFR: 61 mL/min/{1.73_m2} (ref >59)

## 2022-05-18 DIAGNOSIS — Z0100 Encounter for examination of eyes and vision without abnormal findings: Secondary | ICD-10-CM | POA: Diagnosis not present

## 2022-05-26 ENCOUNTER — Other Ambulatory Visit: Payer: Self-pay | Admitting: Family Medicine

## 2022-05-29 ENCOUNTER — Encounter: Payer: Self-pay | Admitting: Family Medicine

## 2022-05-29 ENCOUNTER — Ambulatory Visit (INDEPENDENT_AMBULATORY_CARE_PROVIDER_SITE_OTHER): Payer: Medicare HMO | Admitting: Family Medicine

## 2022-05-29 VITALS — BP 127/80 | HR 85 | Temp 98.9°F | Wt 156.4 lb

## 2022-05-29 DIAGNOSIS — G8929 Other chronic pain: Secondary | ICD-10-CM

## 2022-05-29 DIAGNOSIS — R051 Acute cough: Secondary | ICD-10-CM

## 2022-05-29 DIAGNOSIS — I10 Essential (primary) hypertension: Secondary | ICD-10-CM | POA: Diagnosis not present

## 2022-05-29 MED ORDER — PREDNISONE 50 MG PO TABS: 50.0000 mg | ORAL_TABLET | Freq: Every day | ORAL | 0 refills | Status: DC

## 2022-05-29 NOTE — Assessment & Plan Note (Signed)
Pain improved with addition of baclofen.  She was interested in a Toradol shot today but we decided not to do this since I am prescribing prednisone.  She will follow-up in 3 weeks to discuss further.  Refill baclofen.

## 2022-05-29 NOTE — Patient Instructions (Addendum)
I'm sorry you aren't feeling well  Sent in prednisone to help with cough and wheezing Follow up if not improving later this week I'll see you back in Jan 29th.  Be well, Dr. [name]  Viral Respiratory Infection A respiratory infection is an illness that affects part of the respiratory system, such as the lungs, nose, or throat. A respiratory infection that is caused by a virus is called a viral respiratory infection. Common types of viral respiratory infections include: A cold. The flu (influenza). A respiratory syncytial virus (RSV) infection. What are the causes? This condition is caused by a virus. The virus may spread through contact with droplets or direct contact with infected people or their mucus or secretions. The virus may spread from person to person (is contagious). What are the signs or symptoms? Symptoms of this condition include: A stuffy or runny nose. A sore throat or cough. Shortness of breath or difficulty breathing. Yellow or green mucus (sputum). Other symptoms may include: A fever. Sweating or chills. Fatigue. Achy muscles. A headache. How is this diagnosed? This condition may be diagnosed based on: Your symptoms. A physical exam. Testing of secretions from the nose or throat. Chest X-ray. How is this treated? This condition may be treated with medicines, such as: Antiviral medicine. This may shorten the length of time a person has symptoms. Expectorants. These make it easier to cough up mucus. Decongestant nasal sprays. Acetaminophen or NSAIDs, such as ibuprofen, to relieve fever and pain. Antibiotic medicines are not prescribed for viral infections.This is because antibiotics are designed to kill bacteria. They do not kill viruses. Follow these instructions at home: Managing pain and congestion Take over-the-counter and prescription medicines only as told by your health care provider. If you have a sore throat, gargle with a mixture of salt and  water 3-4 times a day or as needed. To make salt water, completely dissolve -1 tsp (3-6 g) of salt in 1 cup (237 mL) of warm water. Use nose drops made from salt water to ease congestion and soften raw skin around your nose. Take 2 tsp (10 mL) of honey at bedtime to lessen coughing at night. Do not give honey to children who are younger than 1 year. Drink enough fluid to keep your urine pale yellow. This helps prevent dehydration and helps loosen up mucus. General instructions  Rest as much as possible. Do not drink alcohol. Do not use any products that contain nicotine or tobacco. These products include cigarettes, chewing tobacco, and vaping devices, such as e-cigarettes. If you need help quitting, ask your health care provider. Keep all follow-up visits. This is important. How is this prevented?     Get an annual flu shot. You may get the flu shot in late summer, fall, or winter. Ask your health care provider when you should get your flu shot. Avoid spreading your infection to other people. If you are sick: Wash your hands with soap and water often, especially after you cough or sneeze. Wash for at least 20 seconds. If soap and water are not available, use alcohol-based hand sanitizer. Cover your mouth when you cough. Cover your nose and mouth when you sneeze. Do not share cups or eating utensils. Clean commonly used objects often. Clean commonly touched surfaces. Stay home from work or school as told by your health care provider. Avoid contact with people who are sick during cold and flu season. This is generally fall and winter. Contact a health care provider if: Your symptoms last  for 10 days or longer. Your symptoms get worse over time. You have severe sinus pain in your face or forehead. The glands in your jaw or neck become very swollen. You have shortness of breath. Get help right away if you: Feel pain or pressure in your chest. Have trouble breathing. Faint or feel like  you will faint. Have severe and persistent vomiting. Feel confused or disoriented. These symptoms may represent a serious problem that is an emergency. Do not wait to see if the symptoms will go away. Get medical help right away. Call your local emergency services (911 in the [place] Do not drive yourself to the hospital. Summary A respiratory infection is an illness that affects part of the respiratory system, such as the lungs, nose, or throat. A respiratory infection that is caused by a virus is called a viral respiratory infection. Common types of viral respiratory infections include a cold, influenza, and respiratory syncytial virus (RSV) infection. Symptoms of this condition include a stuffy or runny nose, cough, fatigue, achy muscles, sore throat, and fevers or chills. Antibiotic medicines are not prescribed for viral infections. This is because antibiotics are designed to kill bacteria. They are not effective against viruses. This information is not intended to replace advice given to you by your health care provider. Make sure you discuss any questions you have with your health care provider. Document Revised: 08/12/2020 Document Reviewed: 08/12/2020 Elsevier Patient Education  [place].

## 2022-05-29 NOTE — Progress Notes (Signed)
  Date of Visit: 05/29/2022   SUBJECTIVE:   HPI:  R[place] [name] presents today for cough.  Cough: Has had a cough and some congestion for about a week.  It is more an issue of cough and wheezing in her lungs, rather than congestion in her nose.  Denies fever or vomiting.  Not eating very well but is trying to stay hydrated.  She tried NyQuil without any relief.  She has used her albuterol inhaler which temporarily helps, but then the cough and wheezing returns.  She reports not feeling worse or better, about the same.  Has a history of asthma.  Hypertension: Currently taking lisinopril-HCTZ 20-25 mg tablet half of a pill daily.  Did not take her medication this morning.  Chronic pain: Was interested in a Toradol shot today but does not want to interact with the medication I will be prescribing.  She has been using baclofen 5 mg twice a day as needed for pain, with good relief.  She would like a refill of this.   OBJECTIVE:   BP 127/80   Pulse 85   Temp 98.9 F (37.2 C)   Wt 156 lb 6.4 oz (70.9 kg)   SpO2 98%   BMI 26.85 kg/m  Gen: No acute distress, pleasant, cooperative HEENT: Normocephalic, atraumatic.  Moist mucous membranes.  Tympanic membranes clear bilaterally.  No nasal drainage.  No anterior cervical or supraclavicular lymphadenopathy. Heart: Regular rate and rhythm, no murmur Lungs: Clear to auscultation bilaterally, normal effort, speaks in full sentences without any distress.  Occasional cough. Neuro: Alert, grossly nonfocal, speech normal  ASSESSMENT/PLAN:   Health maintenance:  -Interested in annual wellness visit, will get her added to the list to be contacted  Cough Suspect viral illness.  Given duration of symptoms over 1 week and general well appearance will not proceed with any viral testing.  I do think a course of prednisone is reasonable in light of her reported wheezing and history of asthma.  Sent in prednisone 50 mg daily for 5 days.  Counseled to follow-up  later this week if she is not feeling any better.    HYPERTENSION, BENIGN SYSTEMIC Improved on today's check.  Continue current regimen.  Encounter for chronic pain management Pain improved with addition of baclofen.  She was interested in a Toradol shot today but we decided not to do this since I am prescribing prednisone.  She will follow-up in 3 weeks to discuss further.  Refill baclofen.  FOLLOW UP: Follow up in 3 weeks for above issues  [place] J. [name], [place]

## 2022-05-29 NOTE — Assessment & Plan Note (Signed)
Improved on today's check.  Continue current regimen.

## 2022-06-08 ENCOUNTER — Ambulatory Visit
Admission: RE | Admit: 2022-06-08 | Discharge: 2022-06-08 | Disposition: A | Discharge status: Home / Self Care | Payer: Medicare HMO | Source: Ambulatory Visit | Attending: Family Medicine | Admitting: Family Medicine

## 2022-06-08 DIAGNOSIS — Z1231 Encounter for screening mammogram for malignant neoplasm of breast: Secondary | ICD-10-CM | POA: Diagnosis not present

## 2022-06-19 ENCOUNTER — Ambulatory Visit (INDEPENDENT_AMBULATORY_CARE_PROVIDER_SITE_OTHER): Payer: Medicare HMO | Admitting: Family Medicine

## 2022-06-19 ENCOUNTER — Encounter: Payer: Self-pay | Admitting: Family Medicine

## 2022-06-19 ENCOUNTER — Other Ambulatory Visit: Payer: Self-pay

## 2022-06-19 VITALS — BP 156/74 | HR 96 | Ht 64.0 in | Wt 159.8 lb

## 2022-06-19 DIAGNOSIS — G8929 Other chronic pain: Secondary | ICD-10-CM

## 2022-06-19 DIAGNOSIS — I10 Essential (primary) hypertension: Secondary | ICD-10-CM | POA: Diagnosis not present

## 2022-06-19 DIAGNOSIS — J4520 Mild intermittent asthma, uncomplicated: Secondary | ICD-10-CM | POA: Diagnosis not present

## 2022-06-19 MED ORDER — KETOROLAC TROMETHAMINE 30 MG/ML IJ SOLN
30.0000 mg | Freq: Once | INTRAMUSCULAR | Status: AC
  Administered 2022-06-19: 30 mg via INTRAMUSCULAR

## 2022-06-19 MED ORDER — ALBUTEROL SULFATE HFA 108 (90 BASE) MCG/ACT IN AERS: 2.0000 | INHALATION_SPRAY | Freq: Four times a day (QID) | RESPIRATORY_TRACT | 3 refills | Status: DC | PRN

## 2022-06-19 NOTE — Assessment & Plan Note (Signed)
Uncontrolled given frequency of albuterol use.  Discussed regularly using Advair to help get asthma under better control.  She will do this.

## 2022-06-19 NOTE — Patient Instructions (Signed)
It was great to see you again today.  Toradol shot today Use your Advair twice a day every day, this is to help get your asthma under better control Follow-up in 1 month to see how your asthma is doing  Be well, Dr. [name]

## 2022-06-19 NOTE — Assessment & Plan Note (Signed)
Blood pressure elevated, but did not take her antihypertensive medications today.  We will recheck this in a month when we follow-up on how her asthma is doing.

## 2022-06-19 NOTE — Assessment & Plan Note (Signed)
Toradol shot given today per patient request.  This usually brings her great relief.

## 2022-06-19 NOTE — Progress Notes (Signed)
  Date of Visit: 06/19/2022   SUBJECTIVE:   HPI:  Raven [name] presents today for routine follow-up.  Back pain: Has ongoing chronic back pain in her low back going down her legs.  Denies any accompanying worrisome symptoms.  She is well aware of red flags/alarm symptoms to report.  She would like a Toradol injection today if possible.  She also takes gabapentin 900 mg 3 times a day, and occasionally will take an ibuprofen 800 mg tablet, but this is rare.  Hypertension: Currently taking lisinopril-HCTZ 20-25 mg half of a pill daily.  She is tolerating this well.  She did not yet take her dose today.  Asthma: Is prescribed Advair 100-50 mcg 1 puff twice a day, but reports she has not taken this in quite some time.  She uses her albuterol about 3-4 times a week.  She requests a refill on albuterol inhaler.   OBJECTIVE:   BP (!) 156/74   Pulse 96   Ht '5\' 4"'$  (1.626 m)   Wt 159 lb 12.8 oz (72.5 kg)   SpO2 100%   BMI 27.43 kg/m  Gen: No acute distress, pleasant, cooperative HEENT: Normocephalic, atraumatic Heart: Regular rate and rhythm, no murmur Lungs: Lungs clear to auscultation bilaterally, normal effort Neuro: Grossly nonfocal, speech normal Ext: No edema in lower extremities  ASSESSMENT/PLAN:   Health maintenance:  -has upcoming appointment for Annual Wellness Visit -Has prescription for shingles vaccine, but has not yet gotten it  Encounter for chronic pain management Toradol shot given today per patient request.  This usually brings her great relief.  Asthma Uncontrolled given frequency of albuterol use.  Discussed regularly using Advair to help get asthma under better control.  She will do this.  HYPERTENSION, BENIGN SYSTEMIC Blood pressure elevated, but did not take her antihypertensive medications today.  We will recheck this in a month when we follow-up on how her asthma is doing.  FOLLOW UP: Follow up in 1 month for asthma & hypertension   Raven [name],  [place]

## 2022-07-02 NOTE — Patient Instructions (Signed)

## 2022-07-02 NOTE — Progress Notes (Unsigned)
I connected with  Raven [name] on 07/03/2022 by a audio enabled telemedicine application and verified that I am speaking with the correct person using two identifiers.  Patient Location: Home  Provider Location: Home Office  I discussed the limitations of evaluation and management by telemedicine. The patient expressed understanding and agreed to proceed.   Subjective:   Raven [name] is a 72 y.o. female who presents for an Initial Medicare Annual Wellness Visit.  Review of Systems     Per HPI unless specifically indicated below.  Cardiac Risk Factors include: advanced age (>39mn, >>87women);female gender,Hyperlipidemia. and Hypertension.          Objective:        06/19/2022    9:17 AM 06/19/2022    8:56 AM 05/29/2022    8:47 AM  Vitals with BMI  Height  5' 4"$    Weight  159 lbs 13 oz 156 lbs 6 oz  BMI  [id number]  Systolic [id number] Diastolic 74 73 80  Pulse  96 85    Today's Vitals   07/03/22 0802  PainSc: 7    There is no height or weight on file to calculate BMI.     07/03/2022    8:08 AM 05/29/2022    8:50 AM 04/10/2022   10:41 AM 02/23/2022    8:22 AM 12/26/2021    9:09 AM 02/08/2021   10:10 AM 09/07/2020   12:41 PM  Advanced Directives  Does Patient Have a Medical Advance Directive? No No No No No No No  Would patient like information on creating a medical advance directive? No - Patient declined  No - Patient declined No - Patient declined No - Guardian declined No - Patient declined No - Patient declined    Current Medications (verified) Outpatient Encounter Medications as of 07/03/2022  Medication Sig   acetaminophen (TYLENOL) 500 MG tablet Take 500 mg by mouth every 6 (six) hours as needed.   albuterol (PROVENTIL) (2.5 MG/3ML) 0.083% nebulizer solution INHALE THE CONTENTS OF 1 VIAL VIA NEBULIZER EVERY 6 HOURS AS NEEDED FOR SHORTNESS OF BREATH  OR  WHEEZING   albuterol (VENTOLIN HFA) 108 (90 Base) MCG/ACT inhaler Inhale 2 puffs into the lungs every 6  (six) hours as needed for wheezing or shortness of breath.   atorvastatin (LIPITOR) 40 MG tablet Take 1 tablet (40 mg total) by mouth daily.   baclofen (LIORESAL) 10 MG tablet TAKE 0.5 TABLETS (5 MG TOTAL) BY MOUTH 2 (TWO) TIMES DAILY AS NEEDED FOR MUSCLE SPASMS.   Cholecalciferol 25 MCG (1000 UT) tablet Take 1,000 Units by mouth daily.   fluticasone-salmeterol (ADVAIR) 100-50 MCG/ACT AEPB Inhale 1 puff into the lungs 2 (two) times daily.   gabapentin (NEURONTIN) 300 MG capsule Take 3 capsules (900 mg total) by mouth 3 (three) times daily.   hydrocortisone 1 % ointment Apply 1 Application topically 2 (two) times daily. (Patient taking differently: Apply 1 Application topically 2 (two) times daily as needed.)   IBU 800 MG tablet TAKE 1 TABLET EVERY DAY AS NEEDED   lisinopril-hydrochlorothiazide (ZESTORETIC) 20-25 MG tablet TAKE 1/2 TABLET EVERY DAY   Multiple Vitamin (MULTIVITAMIN WITH MINERALS) TABS tablet Take 1 tablet by mouth daily.   polyethylene glycol powder (GLYCOLAX/MIRALAX) powder Take 17 g by mouth 2 (two) times daily as needed for mild constipation.   No facility-administered encounter medications on file as of 07/03/2022.    Allergies (verified) Procaine hcl and Tomato   History: Past Medical  History:  Diagnosis Date   Asthma    Chronic back pain    Chronic leg pain    left   Generalized headaches    ocasional, she uses naproxen.   GERD (gastroesophageal reflux disease)    Hx of adenomatous colonic polyps 09/11/2019   Hyperlipidemia    Hypertension    Left lumbar radiculopathy    Sciatica of left side    Spinal stenosis of lumbar region    Tobacco abuse    Past Surgical History:  Procedure Laterality Date   CESAREAN SECTION     x2   COLONOSCOPY  2021   Adenomas   TRACHEOSTOMY     when she was 20   Family History  Problem Relation Age of Onset   Colon polyps Maternal Uncle    Colon cancer Neg Hx    Esophageal cancer Neg Hx    Rectal cancer Neg Hx    Stomach  cancer Neg Hx    Social History   Socioeconomic History   Marital status: Single    Spouse name: Not on file   Number of children: 5   Years of education: Not on file   Highest education level: Not on file  Occupational History   Occupation: Retired  Tobacco Use   Smoking status: Former    Packs/day: 0.25    Types: Cigarettes    Quit date: 02/02/2020    Years since quitting: 2.4   Smokeless tobacco: Never  Vaping Use   Vaping Use: Never used  Substance and Sexual Activity   Alcohol use: Yes    Comment: occasional, holidays   Drug use: No   Sexual activity: Not on file  Other Topics Concern   Not on file  Social History Narrative   She is married, occasional alcohol, no drug use, she does smoke cigarettes.   Has at least 1 daughter   Social Determinants of Health   Financial Resource Strain: Not on file  Food Insecurity: No Food Insecurity (07/03/2022)   Hunger Vital Sign    Worried About Running Out of Food in the Last Year: Never true    Ran Out of Food in the Last Year: Never true  Transportation Needs: No Transportation Needs (07/03/2022)   PRAPARE - Hydrologist (Medical): No    Lack of Transportation (Non-Medical): No  Physical Activity: Insufficiently Active (07/03/2022)   Exercise Vital Sign    Days of Exercise per Week: 2 days    Minutes of Exercise per Session: 10 min  Stress: No Stress Concern Present (07/03/2022)   [place]    Feeling of Stress : Not at all  Social Connections: Socially Isolated (07/03/2022)   Social Connection and Isolation Panel [NHANES]    Frequency of Communication with Friends and Family: More than three times a week    Frequency of Social Gatherings with Friends and Family: Never    Attends Religious Services: Never    Marine scientist or Organizations: No    Attends Music therapist: Never    Marital Status: Never  married    Tobacco Counseling Counseling given: No   Clinical Intake:  Pre-visit preparation completed: No  Pain : 0-10 Pain Score: 7  Pain Type: Chronic pain Pain Location: Back Pain Orientation: Lower Pain Radiating Towards: Pain radiates down both legs to the thighs Pain Descriptors / Indicators: Aching Pain Frequency: Constant     Nutritional Status:  BMI of 19-24  Normal Nutritional Risks: Unintentional weight gain Diabetes: No  How often do you need to have someone help you when you read instructions, pamphlets, or other written materials from your doctor or pharmacy?: 1 - Never  Diabetic?No  Interpreter Needed?: No  Information entered by :: Donnie Mesa, CMA   Activities of Daily Living    07/03/2022    8:00 AM  In your present state of health, do you have any difficulty performing the following activities:  Hearing? 0  Vision? 0  Comment [facility] 04/2022  Difficulty concentrating or making decisions? 0  Walking or climbing stairs? 1  Dressing or bathing? 0  Doing errands, shopping? 0    Patient Care Team: Leeanne Rio, MD as PCP - General (Family Medicine) Michael Boston, MD as Consulting Physician (General Surgery) Gatha Mayer, MD as Consulting Physician (Gastroenterology)  Indicate any recent Medical Services you may have received from other than [facility] providers in the past year (date may be approximate).     Assessment:   This is a routine wellness examination for Neziah.   Hearing/Vision screen Denies any hearing issues. Denies any change to her vision. Wear glasses. Annual Eye Exam.   Dietary issues and exercise activities discussed: Current Exercise Habits: Home exercise routine, Type of exercise: strength training/weights;stretching, Time (Minutes): 10, Frequency (Times/Week): 2, Weekly Exercise (Minutes/Week): 20, Intensity: Mild, Exercise limited by: orthopedic condition(s)   Goals Addressed             This  Visit's Progress    Stay Active and Independent-Low Back Pain       Why is this important?   Regular activity or exercise is important to managing back pain.  Activity helps to keep your muscles strong.  You will sleep better and feel more relaxed.  You will have more energy and feel less stressed.  If you are not active now, start slowly. Little changes make a big difference.  Rest, but not too much.  Stay as active as you can and listen to your body's signals.           Depression Screen    07/03/2022    7:57 AM 06/19/2022    8:57 AM 05/29/2022    8:50 AM 04/10/2022   10:39 AM 02/23/2022    8:26 AM 12/26/2021    9:09 AM 02/08/2021   10:10 AM  PHQ 2/9 Scores  PHQ - 2 Score 0 0 0 0 0 0 0  PHQ- 9 Score 2 2 0 3 0 0     Fall Risk    07/03/2022    8:00 AM 06/19/2022    8:57 AM 04/10/2022   10:38 AM 02/23/2022    8:26 AM 12/26/2021    9:09 AM  Fall Risk   Falls in the past year? 0 0 0 0 0  Number falls in past yr: 0 0 0    Injury with Fall? 0 0 0    Risk for fall due to : No Fall Risks      Follow up Falls evaluation completed        FALL RISK PREVENTION PERTAINING TO THE HOME:  Any stairs in or around the home? No  If so, are there any without handrails? No  Home free of loose throw rugs in walkways, pet beds, electrical cords, etc? Yes  Adequate lighting in your home to reduce risk of falls? Yes   ASSISTIVE DEVICES UTILIZED TO PREVENT FALLS:  Life alert?  No  Use of a cane, walker or w/c? Yes  Grab bars in the bathroom? No  Shower chair or bench in shower? No  Elevated toilet seat or a handicapped toilet? No   TIMED UP AND GO:  Was the test performed? Unable to perform, virtual appointment  .  Cognitive Function:        07/03/2022    8:01 AM  6CIT Screen  What Year? 0 points  What month? 0 points  What time? 0 points  Count back from 20 0 points  Months in reverse 4 points  Repeat phrase 0 points  Total Score 4 points    Immunizations Immunization History   Administered Date(s) Administered   COVID-19, mRNA, vaccine(Comirnaty)12 years and older 05/04/2022   Fluad Quad(high Dose 65+) 02/05/2020, 02/08/2021, 04/10/2022   Influenza Split 07/02/2012   Influenza, High Dose Seasonal PF 03/04/2017   Influenza,inj,Quad PF,6+ Mos 04/01/2013, 04/01/2014, 03/04/2015, 04/12/2016, 02/04/2018, 02/10/2019   PFIZER Comirnaty(Gray Top)Covid-19 Tri-Sucrose Vaccine 10/20/2020   PFIZER(Purple Top)SARS-COV-2 Vaccination 07/18/2019, 08/12/2019, 04/21/2020   Pfizer Covid-19 Vaccine Bivalent Booster 47yr & up 05/03/2021   Pneumococcal Conjugate-13 09/09/2015   Pneumococcal Polysaccharide-23 04/01/2014, 03/04/2017   Td 04/21/2001   Tdap 02/13/2021    TDAP status: Up to date  Flu Vaccine status: Up to date  Pneumococcal vaccine status: Up to date  Covid-19 vaccine status: Information provided on how to obtain vaccines.   Qualifies for Shingles Vaccine? Yes   Zostavax completed No   Shingrix Completed?: No.    Education has been provided regarding the importance of this vaccine. Patient has been advised to call insurance company to determine out of pocket expense if they have not yet received this vaccine. Advised may also receive vaccine at local pharmacy or Health Dept. Verbalized acceptance and understanding.  Screening Tests Health Maintenance  Topic Date Due   Zoster Vaccines- Shingrix (1 of 2) Never done   COVID-19 Vaccine (7 - 2023-24 season) 06/29/2022   Medicare Annual Wellness (AWV)  07/04/2023   DEXA SCAN  02/19/2024   MAMMOGRAM  06/08/2024   COLONOSCOPY (Pts 45-455yrInsurance coverage will need to be confirmed)  09/08/2024   DTaP/Tdap/Td (3 - Td or Tdap) 02/14/2031   Pneumonia Vaccine 6566Years old  Completed   INFLUENZA VACCINE  Completed   Hepatitis C Screening  Completed   HPV VACCINES  Aged Out    Health Maintenance  Health Maintenance Due  Topic Date Due   Zoster Vaccines- Shingrix (1 of 2) Never done   COVID-19 Vaccine (7 -  2023-24 season) 06/29/2022    Colorectal cancer screening: Type of screening: Colonoscopy. Completed 09/09/2019. Repeat every 5 years  Mammogram status: Completed 06/08/2022. Repeat every year  DEXA Scan: completed 02/18/2021  Lung Cancer Screening: (Low Dose CT Chest recommended if Age 647-80ears, 30 pack-year currently smoking OR have quit w/in 15years.) does not qualify.   Lung Cancer Screening Referral: not applicable   Additional Screening:  Hepatitis C Screening: does qualify; Completed 04/05/2015  Vision Screening: Recommended annual ophthalmology exams for early detection of glaucoma and other disorders of the eye. Is the patient up to date with their annual eye exam?  Yes  Who is the provider or what is the name of the office in which the patient attends annual eye exams? [facility]  If pt is not established with a provider, would they like to be referred to a provider to establish care? No .   Dental Screening: Recommended annual dental exams for  proper oral hygiene  Community Resource Referral / Chronic Care Management: CRR required this visit?  No   CCM required this visit?  No      Plan:     I have personally reviewed and noted the following in the patient's chart:   Medical and social history Use of alcohol, tobacco or illicit drugs  Current medications and supplements including opioid prescriptions. Patient is not currently taking opioid prescriptions. Functional ability and status Nutritional status Physical activity Advanced directives List of other physicians Hospitalizations, surgeries, and ER visits in previous 12 months Vitals Screenings to include cognitive, depression, and falls Referrals and appointments  In addition, I have reviewed and discussed with patient certain preventive protocols, quality metrics, and best practice recommendations. A written personalized care plan for preventive services as well as general preventive health  recommendations were provided to patient.    Ms. Choat , Thank you for taking time to come for your Medicare Wellness Visit. I appreciate your ongoing commitment to your health goals. Please review the following plan we discussed and let me know if I can assist you in the future.   These are the goals we discussed:  Goals      Stay Active and Independent-Low Back Pain     Why is this important?   Regular activity or exercise is important to managing back pain.  Activity helps to keep your muscles strong.  You will sleep better and feel more relaxed.  You will have more energy and feel less stressed.  If you are not active now, start slowly. Little changes make a big difference.  Rest, but not too much.  Stay as active as you can and listen to your body's signals.            This is a list of the screening recommended for you and due dates:  Health Maintenance  Topic Date Due   Zoster (Shingles) Vaccine (1 of 2) Never done   COVID-19 Vaccine (7 - 2023-24 season) 06/29/2022   Medicare Annual Wellness Visit  07/04/2023   DEXA scan (bone density measurement)  02/19/2024   Mammogram  06/08/2024   Colon Cancer Screening  09/08/2024   DTaP/Tdap/Td vaccine (3 - Td or Tdap) 02/14/2031   Pneumonia Vaccine  Completed   Flu Shot  Completed   Hepatitis C Screening: USPSTF Recommendation to screen - Ages 18-79 yo.  Completed   HPV Vaccine  Aged [address], [place]   07/03/2022  Nurse Notes: Approximately 30 minute Non-Face -To-Face Medicare Wellness Visit

## 2022-07-03 ENCOUNTER — Ambulatory Visit (INDEPENDENT_AMBULATORY_CARE_PROVIDER_SITE_OTHER): Payer: Medicare HMO

## 2022-07-03 DIAGNOSIS — Z0000 Encounter for general adult medical examination without abnormal findings: Secondary | ICD-10-CM | POA: Diagnosis not present

## 2022-07-21 ENCOUNTER — Other Ambulatory Visit: Payer: Self-pay | Admitting: Family Medicine

## 2022-07-21 DIAGNOSIS — R21 Rash and other nonspecific skin eruption: Secondary | ICD-10-CM

## 2022-07-21 NOTE — Progress Notes (Signed)
[name] She inquired about dermatology referral update - I looked in her record and realized I inadvertently never placed this referral. I have entered it  Raven Rio, MD

## 2022-08-07 ENCOUNTER — Encounter: Payer: Self-pay | Admitting: Family Medicine

## 2022-08-07 ENCOUNTER — Ambulatory Visit (INDEPENDENT_AMBULATORY_CARE_PROVIDER_SITE_OTHER): Payer: Medicare HMO | Admitting: Family Medicine

## 2022-08-07 VITALS — BP 127/80 | HR 84 | Ht 66.0 in | Wt 158.2 lb

## 2022-08-07 DIAGNOSIS — I10 Essential (primary) hypertension: Secondary | ICD-10-CM | POA: Diagnosis not present

## 2022-08-07 DIAGNOSIS — G8929 Other chronic pain: Secondary | ICD-10-CM

## 2022-08-07 DIAGNOSIS — J4520 Mild intermittent asthma, uncomplicated: Secondary | ICD-10-CM

## 2022-08-07 MED ORDER — SHINGRIX 50 MCG/0.5ML IM SUSR: INTRAMUSCULAR | 1 refills | Status: DC

## 2022-08-07 MED ORDER — BACLOFEN 10 MG PO TABS: 10.0000 mg | ORAL_TABLET | Freq: Two times a day (BID) | ORAL | 1 refills | Status: DC | PRN

## 2022-08-07 MED ORDER — KETOROLAC TROMETHAMINE 30 MG/ML IJ SOLN
30.0000 mg | Freq: Once | INTRAMUSCULAR | Status: AC
  Administered 2022-08-07: 30 mg via INTRAMUSCULAR

## 2022-08-07 NOTE — Progress Notes (Unsigned)
  Date of Visit: 08/07/2022   SUBJECTIVE:   HPI:  Raven [name] presents today for routine follow-up.  Sciatica: Request Toradol shot today.  Has had increased pain lately.  Overall it is doing okay.  Takes ibuprofen sparingly.  Also takes gabapentin 900 mg 3 times a day with good symptom control.  She request refill of baclofen.  Would like to try taking a 10 mg pill if it helps.  Asthma: Has albuterol inhaler as well as nebulizer.  She also is prescribed Advair but reports not using it consistently.  Reports she gets better symptom relief with albuterol and Advair.  Hypertension - currently taking lisinopril-HCTZ 20-25mg  tablet HALF of a pill daily, tolerating well. Blood pressure was up at last visit but better today.  OBJECTIVE:   BP 127/80   Pulse 84   Ht 5\' 6"  (1.676 m)   Wt 158 lb 3.2 oz (71.8 kg)   SpO2 97%   BMI 25.53 kg/m  Heart rate initially 118, improved to 84 on my recheck Gen: No acute distress, pleasant, cooperative HEENT: Normocephalic, atraumatic Heart: Regular rate and rhythm, no murmur Lungs: Clear to auscultation bilaterally, normal effort Neuro: Grossly nonfocal, speech normal, ambulates around room easily Ext: No edema  ASSESSMENT/PLAN:   Health maintenance:  -printed rx given for shingrix, patient states she will get this  Encounter for chronic pain management Toradol shot today - gets great relief with this Refilled baclofen, continue gabapentin Follow up scheduled in 1 month per patient preference  HYPERTENSION, BENIGN SYSTEMIC Normal today, continue current regimen  Asthma Reviewed roles of controller medications vs rescue inhalers, advised to take advair consistently regardless of symptoms  FOLLOW UP: Follow up in 1 mo for above issues  [place] J. [name], [place]

## 2022-08-07 NOTE — Patient Instructions (Signed)
It was great to see you again today.  Toradol shot today Take shingles vaccine prescription to your pharmacy  Refilled baclofen Follow up with me in 1 month, sooner if needed  Be well, Dr. [name]

## 2022-08-09 NOTE — Assessment & Plan Note (Signed)
Reviewed roles of controller medications vs rescue inhalers, advised to take advair consistently regardless of symptoms

## 2022-08-09 NOTE — Assessment & Plan Note (Signed)
Normal today, continue current regimen

## 2022-08-09 NOTE — Assessment & Plan Note (Signed)
Toradol shot today - gets great relief with this Refilled baclofen, continue gabapentin Follow up scheduled in 1 month per patient preference

## 2022-08-31 ENCOUNTER — Other Ambulatory Visit: Payer: Self-pay | Admitting: Family Medicine

## 2022-09-04 ENCOUNTER — Ambulatory Visit (INDEPENDENT_AMBULATORY_CARE_PROVIDER_SITE_OTHER): Payer: Medicare HMO | Admitting: Family Medicine

## 2022-09-04 ENCOUNTER — Other Ambulatory Visit: Payer: Self-pay

## 2022-09-04 VITALS — BP 156/85 | HR 114 | Temp 99.0°F | Ht 66.0 in | Wt 153.0 lb

## 2022-09-04 DIAGNOSIS — I10 Essential (primary) hypertension: Secondary | ICD-10-CM

## 2022-09-04 DIAGNOSIS — J069 Acute upper respiratory infection, unspecified: Secondary | ICD-10-CM

## 2022-09-04 MED ORDER — BENZONATATE 100 MG PO CAPS: 100.0000 mg | ORAL_CAPSULE | Freq: Two times a day (BID) | ORAL | 0 refills | Status: DC | PRN

## 2022-09-04 MED ORDER — GUAIFENESIN ER 600 MG PO TB12
1200.0000 mg | ORAL_TABLET | Freq: Two times a day (BID) | ORAL | 1 refills | Status: DC
Start: 2022-09-04 — End: 2022-11-13

## 2022-09-04 NOTE — Progress Notes (Signed)
    SUBJECTIVE:   CHIEF COMPLAINT / HPI:   Raven [name] is a 72 y.o. female who presents to the [facility] clinic today to discuss the following concerns:   Sick Symptoms Her grandson was sick on Thursday (dx with URI) and she started to have symptoms on Friday. She endorses loss of appetite, cough with yellow phlegm, sore throat, sneezing, nasal congestion. No vomiting. No fevers but also hasn't checked. Has not taken any medications for her symptoms.   Has not eaten since Thursday due to loss of appetite. Has been drinking fluid. Has some lower abdominal pain. Has some trouble voiding but denies any increased frequency or dysuria. Does have central chest pain but only when she coughs. Also having some muscle aches.   Stools have been more watery since Friday. No blood in stool. Not having multiple BM in a day.   PERTINENT  PMH / PSH: Hypertension, GERD, hyperlipidemia, obesity, asthma  OBJECTIVE:   BP (!) 148/71   Pulse (!) 114   Ht 5\' 6"  (1.676 m)   Wt 153 lb (69.4 kg)   SpO2 99%   BMI 24.69 kg/m   Vitals:   09/04/22 0947 09/04/22 1000  BP: (!) 148/71 (!) 156/85  Pulse: (!) 110 (!) 114  Temp:  99 F (37.2 C)  SpO2: 99%    General: Ill-appearing, pleasant, able to participate in exam HEENT: Normocephalic, diaphoresis around nares and mouth, EOMI, sclera anicteric, MMM, oropharynx clear without erythema, no rhinorrhea  Cardiac: Tachycardic, regular rhythm, no murmurs. Respiratory: CTAB, normal effort, No wheezes, rales or rhonchi Abdomen: Bowel sounds present, tender to lower abdomen without R/G nondistended Extremities: no edema  ASSESSMENT/PLAN:   1. Viral URI with cough Upper respiratory symptoms x 4 days.  Does appear ill but examination was overall reassuring.  Lung exam was clear without any wheezing or rales.  Had normal work of breathing.  Is tachycardic but afebrile.  No shortness of breath.  Given her symptoms started after known exposure to another sick contact with  similar symptoms, favor conservative treatment at this time.  No need for imaging. Treat symptomatically. - benzonatate (TESSALON) 100 MG capsule; Take 1 capsule (100 mg total) by mouth 2 (two) times daily as needed for cough.  Dispense: 20 capsule; Refill: 0 - guaiFENesin (MUCINEX) 600 MG 12 hr tablet; Take 2 tablets (1,200 mg total) by mouth 2 (two) times daily.  Dispense: 30 tablet; Refill: 1 - Return precautions discussed  2. HYPERTENSION, BENIGN SYSTEMIC Elevated x 2 readings.  Has had intermittently high readings in the past.  Only taking half tablet of lisinopril-hydrochlorothiazide 20-25 mg daily.  Would likely benefit from full dose. - Recommend follow-up with PCP for repeat BP and possible dose adjustments (has appointment on the 18th)    Sabino Dick, DO [facility]

## 2022-09-04 NOTE — Patient Instructions (Addendum)
It was so good to see Raven [name] today! I am sorry that you are not feeling well.   This is most likely a viral infection. This will take time to get over. The treatment for this is supportive care. You can alternate Tylenol and Ibuprofen for pain or fever every 3 hours (there should be 6 hours in between each dose of Tylenol, and 6 hours in between doses of Ibuprofen). You can take a teaspoon of honey by itself or mixed with water to help their cough. Steam baths, Vicks vapor rub, a humidifier and nasal saline spray can help with congestion.   It is important to keep hydrated throughout this time!  Frequent hand washing to prevent recurrent illnesses is important.   You can continue to use your albuterol inhaler as needed for wheezing or shortness of breath. Your lungs sound good today.  I am prescribing Tessalon Perles for your cough, you can use this three times a day as needed. I am also prescribing Mucinex for chest congestion. You can use this twice a day. Remember, the cough can linger! It can take weeks to resolve. If lasting over 8 weeks please return.   Also return if you have shortness of breath, worsening symptoms, unable to keep fluids down or any other symptoms.   Thank you for coming to your visit as scheduled. We have had a large "no-show" problem lately, and this significantly limits our ability to see and care for patients. As a friendly reminder- if you cannot make your appointment please call to cancel. We do have a no show policy for those who do not cancel within 24 hours. Our policy is that if you miss or fail to cancel an appointment within 24 hours, 3 times in a 48-month period, you may be dismissed from our clinic.   Thank you for choosing [facility].   Please call [phone] with any questions about today's appointment.  Please be sure to schedule follow up at the front  desk before you leave today.   Sabino Dick, DO PGY-3 Family Medicine

## 2022-09-07 ENCOUNTER — Encounter: Payer: Self-pay | Admitting: Family Medicine

## 2022-09-07 ENCOUNTER — Other Ambulatory Visit: Payer: Self-pay

## 2022-09-07 ENCOUNTER — Ambulatory Visit (INDEPENDENT_AMBULATORY_CARE_PROVIDER_SITE_OTHER): Payer: Medicare HMO | Admitting: Family Medicine

## 2022-09-07 VITALS — BP 138/69 | HR 97 | Ht 66.0 in | Wt 159.0 lb

## 2022-09-07 DIAGNOSIS — J4521 Mild intermittent asthma with (acute) exacerbation: Secondary | ICD-10-CM | POA: Diagnosis not present

## 2022-09-07 DIAGNOSIS — J069 Acute upper respiratory infection, unspecified: Secondary | ICD-10-CM

## 2022-09-07 MED ORDER — PREDNISONE 50 MG PO TABS
50.0000 mg | ORAL_TABLET | Freq: Every day | ORAL | 0 refills | Status: DC
Start: 2022-09-07 — End: 2022-10-02

## 2022-09-07 NOTE — Patient Instructions (Signed)
It was great to see you again today.  Start prednisone  daily for 5 days - this will help with breathing  Also can take Afrin nasal spray - use only for 3 days max  Can also take guaifenesin (regular mucinex) - available over the counter  Take your advair inhaler twice daily every single day  Follow up with me in 1 month as scheduled  Be well, Dr. Pollie Meyer

## 2022-09-07 NOTE — Progress Notes (Signed)
  Date of Visit: 09/07/2022   SUBJECTIVE:   HPI:  Raven [name] presents today for routine follow-up.  Was recently seen for a upper respiratory infection, prescribed Tessalon and Mucinex.  Has not gotten either of these.  Overall is feeling better but has had ongoing cough and nasal congestion.  Appetite is overall decreased.  Has asthma/COPD.   OBJECTIVE:   BP 138/69   Pulse 97   Ht  (1.676 m)   Wt 159 lb (72.1 kg)   SpO2 98%   BMI 25.66 kg/m  Gen: No acute distress, pleasant, cooperative, well-appearing HEENT: Normocephalic, atraumatic, nares with some clear drainage.  TMs clear bilaterally.  Oropharynx clear.  No anterior cervical lymphadenopathy Heart: Regular rate and rhythm, no murmur Lungs: Normal work of breathing, speaks in full sentences without distress.  No crackles appreciated, but diffuse expiratory wheezing bilaterally Neuro: Alert, grossly nonfocal, speech normal Ext: No edema  ASSESSMENT/PLAN:   Health maintenance:  -Plans to get shingles shot when she feels better  Asthma Noted to be wheezing today, appears to be in acute exacerbation likely due to viral process.  Is overall well-appearing and stable on room air.  Will do a 5-day course of prednisone.  Recommended taking Advair every day twice a day as a preventative medication.  Follow-up in 1 month, sooner if needed.  URI Discussed role of Mucinex and Afrin for management of congestion.  FOLLOW UP: Follow up in 1 month for asthma, sooner if needed  [place] J. Pollie Meyer, MD [facility]

## 2022-09-09 NOTE — Assessment & Plan Note (Signed)
Noted to be wheezing today, appears to be in acute exacerbation likely due to viral process.  Is overall well-appearing and stable on room air.  Will do a 5-day course of prednisone.  Recommended taking Advair every day twice a day as a preventative medication.  Follow-up in 1 month, sooner if needed.

## 2022-10-02 ENCOUNTER — Ambulatory Visit (INDEPENDENT_AMBULATORY_CARE_PROVIDER_SITE_OTHER): Payer: Medicare HMO | Admitting: Student

## 2022-10-02 VITALS — BP 138/76 | HR 109 | Wt 150.0 lb

## 2022-10-02 DIAGNOSIS — M5431 Sciatica, right side: Secondary | ICD-10-CM | POA: Diagnosis not present

## 2022-10-02 MED ORDER — PREDNISONE 50 MG PO TABS: 50.0000 mg | ORAL_TABLET | Freq: Every day | ORAL | 0 refills | Status: DC

## 2022-10-02 NOTE — Progress Notes (Unsigned)
  SUBJECTIVE:   CHIEF COMPLAINT / HPI:   Pain in right side Everything started Thursday, started having pain in right pelvis, running down right leg and stops at the knee. Sometimes while walking she appreciates pain in quads going down to knee as well. Pain is an ache and rated 10/10.   PERTINENT  PMH / PSH: ***  Past Medical History:  Diagnosis Date   Asthma    Chronic back pain    Chronic leg pain    left   Generalized headaches    ocasional, she uses naproxen.   GERD (gastroesophageal reflux disease)    Hx of adenomatous colonic polyps 09/11/2019   Hyperlipidemia    Hypertension    Left lumbar radiculopathy    Sciatica of left side    Spinal stenosis of lumbar region    Tobacco abuse     Patient Care Team: Latrelle Dodrill, MD as PCP - General (Family Medicine) Karie Soda, MD as Consulting Physician (General Surgery) Iva Boop, MD as Consulting Physician (Gastroenterology) OBJECTIVE:  There were no vitals taken for this visit. Physical Exam   ASSESSMENT/PLAN:  There are no diagnoses linked to this encounter. No follow-ups on file. Bess Kinds, MD 10/02/2022, 8:36 AM PGY-***, [facility] {    This will disappear when note is signed, click to select method of visit    :1}

## 2022-10-02 NOTE — Patient Instructions (Signed)
It was great to see you! Thank you for allowing me to participate in your care!  It looks like this pain might be coming from Sciatica, where the nerve from your back going to your leg is irritated/inflamed.  Our plans for today:  - Sciatica/radiculopathy  Steroids: Prednisone 50 mg for 3 days  Lidocaine patches to back, can wear for 12 hours on then 12 hours off  Voltaren gel to area when not using lidocaine patch                Take care and seek immediate care sooner if you develop any concerns.   Dr. Bess Kinds, MD [facility]

## 2022-10-03 DIAGNOSIS — M5431 Sciatica, right side: Secondary | ICD-10-CM | POA: Insufficient documentation

## 2022-10-03 DIAGNOSIS — M5416 Radiculopathy, lumbar region: Secondary | ICD-10-CM | POA: Insufficient documentation

## 2022-10-03 NOTE — Assessment & Plan Note (Signed)
Patient notes pain in right leg starting at superior glutes/pelvis, pain down the leg to the knee.  Patient appreciates point tenderness in glutes/pelvis, inferior hamstring proximal to glut, and lateral leg proximal to hip.  Patient had negative straight leg raise, but is appreciating a limp and pain limiting range of motion.  Patient has been taking gabapentin and baclofen without relief.  Initially had some concern for meralgia paresthetica but less likely given no relief with gabapentin and pain located on inferior leg as opposed to anterior leg.  Concern for sciatica given distribution of pain and previous history of left-sided sciatica. Will treat with steroids and encourage over-the-counter pain medicine. - Prednisone 50 mg x 3 days - Lidocaine patch and/or Voltaren gel

## 2022-10-05 ENCOUNTER — Ambulatory Visit: Payer: Medicare HMO | Admitting: Family Medicine

## 2022-10-17 ENCOUNTER — Other Ambulatory Visit: Payer: Self-pay | Admitting: Family Medicine

## 2022-10-17 NOTE — Telephone Encounter (Signed)
Patient calls nurse line regarding atorvastatin refill.   She is requesting 2 refills. She needs one to go to [facility]. She is also needing partial refill to go to [facility] on [address], as it will take mail pharmacy ~1 week to mail prescription.   Will forward request to PCP.   Veronda Prude, RN

## 2022-10-18 MED ORDER — ATORVASTATIN CALCIUM 40 MG PO TABS: 40.0000 mg | ORAL_TABLET | Freq: Every day | ORAL | 0 refills | Status: DC

## 2022-10-18 NOTE — Telephone Encounter (Signed)
Rx's sent to each pharmacy as requested  Latrelle Dodrill, MD

## 2022-10-24 ENCOUNTER — Ambulatory Visit (INDEPENDENT_AMBULATORY_CARE_PROVIDER_SITE_OTHER): Payer: Medicare HMO | Admitting: Family Medicine

## 2022-10-24 VITALS — BP 126/74 | HR 85 | Wt 154.0 lb

## 2022-10-24 DIAGNOSIS — M5416 Radiculopathy, lumbar region: Secondary | ICD-10-CM | POA: Diagnosis not present

## 2022-10-24 DIAGNOSIS — M48061 Spinal stenosis, lumbar region without neurogenic claudication: Secondary | ICD-10-CM

## 2022-10-24 MED ORDER — MELOXICAM 15 MG PO TABS: 15.0000 mg | ORAL_TABLET | Freq: Every day | ORAL | 0 refills | Status: AC

## 2022-10-24 NOTE — Progress Notes (Signed)
    SUBJECTIVE:   CHIEF COMPLAINT / HPI:   Back Pain - Previously diagnosed with sciatica of the left side and more recently of the right - Treated with prednisone x3 days and it helped - Previously tried baclofen and gabapentin without improvement - OTC medications have not helped - Pain started back on Sunday without acute injury - Does exercises at home that she learned from previous physical therapy  - Not interested in repeating physical therapy - Has done back injections with improvement and would like to try them again  PERTINENT  PMH / PSH: HTN, Sciatica with mild/mod spinal stenosis in lumbar spine  OBJECTIVE:   BP 126/74   Pulse 85   Wt 154 lb (69.9 kg)   SpO2 95%   BMI 24.86 kg/m   General: NAD, well-appearing, well-nourished Respiratory: No respiratory distress, breathing comfortably, able to speak in full sentences Skin: warm and dry, no rashes noted on exposed skin Psych: Appropriate affect and mood Lumbar spine: - Inspection: no gross deformity or asymmetry, swelling or ecchymosis - Palpation: TTP over the right paraspinal muscles.  No TTP over the spinous processes or SI joint - ROM: Decreased ROM with extension/laying down secondary to right leg pain - Strength: 5/5 strength of lower extremity in L4-S1 nerve root distributions b/l; abnormal gait with limping of the right side - Neuro: sensation intact in the L4-S1 nerve root distribution b/l - Special testing: Negative straight leg raise, positive FABER of the right leg   ASSESSMENT/PLAN:   Lumbar back pain with radiculopathy affecting right lower extremity Right lumbar back pain with radiation into the posterior leg in a patient with a history of sciatica on the left and lumbar spinal stenosis.  Radiation of pain does not extend beyond the knee, consideration for piriformis or hamstring injury/inflammation.  Physical exam also with pain around the right greater trochanteric bursa, concerning for a bursitis  that could be complicating current picture.  Given that the pain radiates up into the back as well, do feel that short course of NSAIDs would be appropriate.  At this time, holding off on repeating steroid course.  Offered patient steroid injection for bursitis, but would prefer to do pills at this time.  Discussed physical therapy, patient declines that she would like to try and reinitiate her back injections. - Meloxicam 15 mg daily x 15 days - Tylenol as needed - Continue physical therapy exercises as tolerated - Referral to PMR placed - Has follow-up with PCP in 2 weeks     Raven Leyden, DO [facility]

## 2022-10-24 NOTE — Patient Instructions (Signed)
I am sending in a prescription for meloxicam, this is a medication that you will take once daily.  While you are on this medication please do not take ibuprofen/Advil/Motrin.  If you are still able to take your Tylenol as needed for pain.  Please continue take all other prescription medications as prescribed.  I am placing a referral to physical medicine rehabilitation to see if spinal injections can be completed.  Please make sure to follow-up with your PCP at your next visit.

## 2022-10-24 NOTE — Assessment & Plan Note (Addendum)
Right lumbar back pain with radiation into the posterior leg in a patient with a history of sciatica on the left and lumbar spinal stenosis.  Radiation of pain does not extend beyond the knee, consideration for piriformis or hamstring injury/inflammation.  Physical exam also with pain around the right greater trochanteric bursa, concerning for a bursitis that could be complicating current picture.  Given that the pain radiates up into the back as well, do feel that short course of NSAIDs would be appropriate.  At this time, holding off on repeating steroid course.  Offered patient steroid injection for bursitis, but would prefer to do pills at this time.  Discussed physical therapy, patient declines that she would like to try and reinitiate her back injections. - Meloxicam 15 mg daily x 15 days - Tylenol as needed - Continue physical therapy exercises as tolerated - Referral to PMR placed - Has follow-up with PCP in 2 weeks

## 2022-11-13 ENCOUNTER — Other Ambulatory Visit: Payer: Self-pay

## 2022-11-13 ENCOUNTER — Encounter: Payer: Self-pay | Admitting: Family Medicine

## 2022-11-13 ENCOUNTER — Encounter: Payer: Self-pay | Admitting: Physical Medicine & Rehabilitation

## 2022-11-13 ENCOUNTER — Ambulatory Visit (INDEPENDENT_AMBULATORY_CARE_PROVIDER_SITE_OTHER): Payer: Medicare HMO | Admitting: Family Medicine

## 2022-11-13 VITALS — BP 135/55 | HR 90 | Ht 66.0 in | Wt 158.6 lb

## 2022-11-13 DIAGNOSIS — M5416 Radiculopathy, lumbar region: Secondary | ICD-10-CM | POA: Diagnosis not present

## 2022-11-13 DIAGNOSIS — I10 Essential (primary) hypertension: Secondary | ICD-10-CM

## 2022-11-13 MED ORDER — KETOROLAC TROMETHAMINE 30 MG/ML IJ SOLN
30.0000 mg | Freq: Once | INTRAMUSCULAR | Status: AC
Start: 2022-11-13 — End: 2022-11-13
  Administered 2022-11-13: 30 mg via INTRAMUSCULAR

## 2022-11-13 MED ORDER — TRAMADOL HCL 50 MG PO TABS: 50.0000 mg | ORAL_TABLET | Freq: Two times a day (BID) | ORAL | 2 refills | Status: DC | PRN

## 2022-11-13 MED ORDER — KETOROLAC TROMETHAMINE 30 MG/ML IJ SOLN
30.0000 mg | Freq: Once | INTRAMUSCULAR | Status: DC
Start: 2022-11-13 — End: 2022-11-13

## 2022-11-13 NOTE — Progress Notes (Signed)
  Date of Visit: 11/13/2022   SUBJECTIVE:   HPI:  Raven [name] presents today for follow-up.  Pain: Still having pain on her right side.  It is in her right lateral thigh, radiating down the back/side of her thigh.  It is painful to sleep on that side.  She does endorse tenderness over the right greater trochanter.  She stopped taking gabapentin as it was not working.  Previously took a course of meloxicam, which she says may be helped a little.  She would like a Toradol shot today.  Also would like to go back and see PM&R at [facility], rather than the [facility] health practice.  Hypertension: Currently taking lisinopril-HCTZ 20-25 mg half a tablet daily.  Tolerating this well.  She got some sad news recently.  Her sister was diagnosed with both cancer and diabetes.  The family is surrounding her sister with support.  OBJECTIVE:   BP (!) 135/55   Pulse 90   Ht 5\' 6"  (1.676 m)   Wt 158 lb 9.6 oz (71.9 kg)   SpO2 100%   BMI 25.60 kg/m  Gen: No acute distress, pleasant, cooperative HEENT: Normocephalic, atraumatic Heart: Regular rate and rhythm, no murmur Lungs: Clear to auscultation bilaterally, normal effort on room air Neuro: Alert, speech normal. Ext: Right lower extremity with full strength with knee flexion, extension, hip flexion, hip abduction and adduction.  Gait slightly antalgic.  Tenderness to right greater trochanter.  ASSESSMENT/PLAN:   Health maintenance:  -Reminded of Shingrix vaccine need  Lumbar back pain with radiculopathy affecting right lower extremity Appears to also have some degree of greater trochanteric bursitis.  She will go see physiatry, referral entered.  Discussed option of steroid injection to greater trochanter.  Today we opted to just do Toradol injection.  Also do trial of tramadol since she has stopped taking gabapentin.  Patient agreeable with this plan.  HYPERTENSION, BENIGN SYSTEMIC Well-controlled, continue current regimen.  FOLLOW  UP: Follow up in 1 month for above issues Referring to physiatry at [facility] per patient's request  [place] J. Pollie Meyer, MD [facility]

## 2022-11-13 NOTE — Assessment & Plan Note (Signed)
Well-controlled, continue current regimen 

## 2022-11-13 NOTE — Assessment & Plan Note (Signed)
Appears to also have some degree of greater trochanteric bursitis.  She will go see physiatry, referral entered.  Discussed option of steroid injection to greater trochanter.  Today we opted to just do Toradol injection.  Also do trial of tramadol since she has stopped taking gabapentin.  Patient agreeable with this plan.

## 2022-11-13 NOTE — Patient Instructions (Addendum)
It was great to see you again today.  Toradol shot today Sent in tramadol - use caution as it might make you sleepy Sending referral back to [facility] Follow up with me in 1 month as scheduled  Be well, Dr. Pollie Meyer

## 2022-12-11 ENCOUNTER — Ambulatory Visit: Payer: Medicare HMO | Admitting: Family Medicine

## 2022-12-11 ENCOUNTER — Encounter: Payer: Self-pay | Admitting: Family Medicine

## 2022-12-11 VITALS — BP 111/61 | HR 89 | Ht 62.0 in | Wt 151.0 lb

## 2022-12-11 DIAGNOSIS — M5416 Radiculopathy, lumbar region: Secondary | ICD-10-CM | POA: Diagnosis not present

## 2022-12-11 DIAGNOSIS — R630 Anorexia: Secondary | ICD-10-CM | POA: Diagnosis not present

## 2022-12-11 MED ORDER — DICLOFENAC SODIUM 1 % EX GEL: 2.0000 g | Freq: Four times a day (QID) | CUTANEOUS | 1 refills | Status: DC | PRN

## 2022-12-11 MED ORDER — TRAMADOL HCL 50 MG PO TABS: 50.0000 mg | ORAL_TABLET | Freq: Three times a day (TID) | ORAL | 0 refills | Status: DC | PRN

## 2022-12-11 MED ORDER — KETOROLAC TROMETHAMINE 30 MG/ML IJ SOLN
30.0000 mg | Freq: Once | INTRAMUSCULAR | Status: AC
Start: 2022-12-11 — End: 2022-12-11
  Administered 2022-12-11: 30 mg via INTRAMUSCULAR

## 2022-12-11 MED ORDER — HYDROCORTISONE 1 % EX OINT: 1.0000 | TOPICAL_OINTMENT | Freq: Two times a day (BID) | CUTANEOUS | 1 refills | Status: DC | PRN

## 2022-12-11 NOTE — Progress Notes (Unsigned)
  Date of Visit: 12/11/2022   SUBJECTIVE:   HPI:  Raven [name] presents today for follow-up of pain.  Right sciatica: Ongoing for the last couple of months.  Heat helps.  Has also tried [place] and Aspercreme.  She thinks she did get some relief from the Toradol shot last time.  Is taking tramadol 50 mg twice daily, which maybe helped some.  Right hip is sometimes painful to lay on, though not always.  She has an upcoming appointment with PM&R.  Weight loss: Notices her appetite has been down for the last few months.  Denies feeling depressed, though is still deeply grieving the loss of her daughter, who was shot and killed within the last year.  She thinks grief is what is keeping her from eating.  She does not want to see a therapist or take any medication for her mood.  Denies abdominal pain, fevers, blood in her stool, or changes in bowel habits.   OBJECTIVE:   BP 111/61 (BP Location: Right Arm, Patient Position: Sitting, Cuff Size: Normal)   Pulse 89   Ht 5\' 2"  (1.575 m)   Wt 151 lb (68.5 kg)   SpO2 100%   BMI 27.62 kg/m  Gen: No acute distress, pleasant, cooperative, well-appearing HEENT: Normocephalic, atraumatic Heart: Regular rate and rhythm, no murmur Lungs: Clear to auscultation bilaterally, normal effort Neuro: Grossly nonfocal, speech normal Ext: Full strength bilateral lower extremities with hip flexion, abduction, adduction.  Full strength with knee flexion and extension bilaterally.  ASSESSMENT/PLAN:    Lumbar back pain with radiculopathy affecting right lower extremity Ongoing.  Reviewed options for treatment.  After discussion, elected to do Toradol shot today, prescribe Voltaren gel, and increase tramadol to 3 times daily.  May benefit from injection of the right greater trochanteric bursa, though patient would prefer to hold off on this for now.  She has upcoming pain management appointment, which I hope will be helpful to her.  Follow-up with me in 1 month.  Poor  appetite Decreased appetite likely related to grief.  Encouraged patient to consider seeing a therapist, however she does not want to do that for now.  She is going to work on increasing how much she is eating.  We will recheck her weight in a month.  FOLLOW UP: Follow up in 1 month for above issues  [place] J. Pollie Meyer, MD [facility]

## 2022-12-11 NOTE — Patient Instructions (Signed)
It was great to see you again today.  Sent in voltaren gel (pain cream) Increase tramadol to 50mg  every 8 hours as needed (three times daily) Toradol shot today Follow up in August as scheduled  Be well, Dr. Pollie Meyer

## 2022-12-12 NOTE — Assessment & Plan Note (Signed)
Decreased appetite likely related to grief.  Encouraged patient to consider seeing a therapist, however she does not want to do that for now.  She is going to work on increasing how much she is eating.  We will recheck her weight in a month.

## 2022-12-12 NOTE — Assessment & Plan Note (Signed)
Ongoing.  Reviewed options for treatment.  After discussion, elected to do Toradol shot today, prescribe Voltaren gel, and increase tramadol to 3 times daily.  May benefit from injection of the right greater trochanteric bursa, though patient would prefer to hold off on this for now.  She has upcoming pain management appointment, which I hope will be helpful to her.  Follow-up with me in 1 month.

## 2022-12-28 ENCOUNTER — Encounter: Payer: Medicare HMO | Attending: Physical Medicine & Rehabilitation | Admitting: Physical Medicine & Rehabilitation

## 2023-01-15 ENCOUNTER — Ambulatory Visit: Payer: Medicare HMO | Admitting: Family Medicine

## 2023-01-15 ENCOUNTER — Encounter: Payer: Self-pay | Admitting: Family Medicine

## 2023-01-15 ENCOUNTER — Other Ambulatory Visit: Payer: Self-pay

## 2023-01-15 VITALS — BP 126/66 | HR 82 | Ht 62.0 in | Wt 152.4 lb

## 2023-01-15 DIAGNOSIS — M7061 Trochanteric bursitis, right hip: Secondary | ICD-10-CM

## 2023-01-15 DIAGNOSIS — G8929 Other chronic pain: Secondary | ICD-10-CM | POA: Diagnosis not present

## 2023-01-15 MED ORDER — METHYLPREDNISOLONE ACETATE 40 MG/ML IJ SUSP
40.0000 mg | Freq: Once | INTRAMUSCULAR | Status: DC
Start: 2023-01-15 — End: 2023-01-15

## 2023-01-15 MED ORDER — METHYLPREDNISOLONE ACETATE 40 MG/ML IJ SUSP
40.0000 mg | Freq: Once | INTRAMUSCULAR | Status: AC
Start: 2023-01-15 — End: 2023-01-15
  Administered 2023-01-15: 40 mg via INTRAMUSCULAR

## 2023-01-15 NOTE — Progress Notes (Signed)
  Date of Visit: 01/15/2023   SUBJECTIVE:   HPI:  Raven [name] presents today for follow-up of pain.  Continues to have pain in her low back on the right side.  Pain is especially located over the right greater trochanter.  She has been using tramadol 50 mg 3 times a day and baclofen 10 mg twice a day.  Is doing well with this regimen, though still has pain.  Unable to lay on right side without pain.  She had an appointment with PM&R but had to miss that appointment due to an asthma attack.  She would like that to be rescheduled.  She is not taking ibuprofen at all.  OBJECTIVE:   BP 126/66   Pulse 82   Ht 5\' 2"  (1.575 m)   Wt 152 lb 6.4 oz (69.1 kg)   SpO2 100%   BMI 27.87 kg/m  Gen: No acute distress, pleasant, cooperative HEENT: Normocephalic, atraumatic Heart: Regular rate and rhythm, no murmur Lungs: Clear to auscultation bilaterally, normal effort Neuro: Grossly nonfocal, speech normal Ext: Tenderness to palpation over right greater trochanter.  Full strength with hip abduction and adduction as well as flexion.    PROCEDURE NOTE:   After informed written consent was obtained, patient was positioned on exam table. R greater trochanter was prepped with betadine and alcohol swab. R greater trochanteric bursa was injected with depomedrol 40mg /mL (without lidocaine given allergy to novacaine). Patient tolerated the procedure well without immediate complications. Bandaid applied.  ASSESSMENT/PLAN:   Health maintenance:  -Reviewed getting shingles vaccine, she has a prescription already  Encounter for chronic pain management Doing well with baclofen and tramadol.  Continue these. We got her another appointment with PM&R in early October. Right greater trochanter injected today.  Follow-up in 1 month, visit scheduled.  FOLLOW UP: Follow up in for 1 month for chronic pain  [place] J. Pollie Meyer, MD [facility]

## 2023-01-15 NOTE — Patient Instructions (Signed)
It was great to see you again today.  Continue your baclofen and tramadol  Be well, Dr. Pollie Meyer

## 2023-01-15 NOTE — Assessment & Plan Note (Signed)
Doing well with baclofen and tramadol.  Continue these. We got her another appointment with PM&R in early October. Right greater trochanter injected today.  Follow-up in 1 month, visit scheduled.

## 2023-01-18 ENCOUNTER — Other Ambulatory Visit: Payer: Self-pay | Admitting: Family Medicine

## 2023-02-12 ENCOUNTER — Ambulatory Visit: Payer: Medicare HMO | Admitting: Family Medicine

## 2023-02-12 ENCOUNTER — Encounter: Payer: Self-pay | Admitting: Family Medicine

## 2023-02-12 ENCOUNTER — Other Ambulatory Visit: Payer: Self-pay

## 2023-02-12 VITALS — BP 134/64 | HR 101 | Ht 62.0 in | Wt 150.0 lb

## 2023-02-12 DIAGNOSIS — G8929 Other chronic pain: Secondary | ICD-10-CM

## 2023-02-12 DIAGNOSIS — Z23 Encounter for immunization: Secondary | ICD-10-CM

## 2023-02-12 DIAGNOSIS — M5416 Radiculopathy, lumbar region: Secondary | ICD-10-CM

## 2023-02-12 MED ORDER — TRAMADOL HCL 50 MG PO TABS
50.0000 mg | ORAL_TABLET | Freq: Three times a day (TID) | ORAL | 0 refills | Status: DC | PRN
Start: 2023-02-12 — End: 2023-03-15

## 2023-02-12 NOTE — Progress Notes (Unsigned)
  Date of Visit: 02/12/2023   SUBJECTIVE:   HPI:  Raven [name] presents today for routine follow up.  Chronic pain - taking tramadol 50mg  three times daily. Tolerating well, no side effects. It helps with pain. Also taking tylenol and baclofen as needed. Has upcoming appointment with pain management next month, had to reschedule her first appointment due to a GI bug she had.  Hypertension - currently taking lisinopril-hydrochlorothiazide 20-25mg  half of a tablet daily. Tolerating well.    OBJECTIVE:   BP 134/64   Pulse (!) 101   Ht 5\' 2"  (1.575 m)   Wt 150 lb (68 kg)   SpO2 97%   BMI 27.44 kg/m  Gen: *** HEENT: *** Heart: *** Lungs: *** Neuro: *** Ext: ***  ASSESSMENT/PLAN:   Health maintenance:  -***  Assessment & Plan     FOLLOW UP: Follow up in *** for ***  [place] J. Pollie Meyer, MD [facility]

## 2023-02-12 NOTE — Patient Instructions (Addendum)
It was great to see you again today.  I will send in your tramadol as soon as I am able to (hopefully today)  Flu and COVID shots today Go get shingles vaccine  Follow up in 1 month, sooner if needed  Be well, Dr. Pollie Meyer

## 2023-02-13 ENCOUNTER — Other Ambulatory Visit: Payer: Self-pay | Admitting: *Deleted

## 2023-02-13 MED ORDER — LISINOPRIL-HYDROCHLOROTHIAZIDE 20-25 MG PO TABS: 0.5000 | ORAL_TABLET | Freq: Every day | ORAL | 3 refills | Status: DC

## 2023-02-14 NOTE — Assessment & Plan Note (Signed)
Unfortunately no improvement with steroid injection of greater trochanteric bursa last visit. Has upcoming appointment with pain mgmt, hopefully can get some relief there. Tramadol refilled today (rx sent by Dr. Miquel Dunn as my e-prescribing was not working yet due to getting a new phone).

## 2023-02-23 ENCOUNTER — Ambulatory Visit: Payer: Medicare HMO | Admitting: Physical Medicine & Rehabilitation

## 2023-03-01 ENCOUNTER — Encounter: Payer: Medicare HMO | Attending: Physical Medicine & Rehabilitation | Admitting: Physical Medicine & Rehabilitation

## 2023-03-08 ENCOUNTER — Other Ambulatory Visit: Payer: Self-pay | Admitting: Family Medicine

## 2023-03-15 ENCOUNTER — Ambulatory Visit: Payer: Medicare HMO | Admitting: Family Medicine

## 2023-03-15 ENCOUNTER — Encounter: Payer: Self-pay | Admitting: Family Medicine

## 2023-03-15 VITALS — BP 126/62 | HR 81 | Wt 153.0 lb

## 2023-03-15 DIAGNOSIS — M5416 Radiculopathy, lumbar region: Secondary | ICD-10-CM

## 2023-03-15 DIAGNOSIS — Z0000 Encounter for general adult medical examination without abnormal findings: Secondary | ICD-10-CM

## 2023-03-15 DIAGNOSIS — G8929 Other chronic pain: Secondary | ICD-10-CM

## 2023-03-15 MED ORDER — TRAMADOL HCL 50 MG PO TABS: 50.0000 mg | ORAL_TABLET | Freq: Three times a day (TID) | ORAL | 3 refills | Status: DC | PRN

## 2023-03-15 MED ORDER — SHINGRIX 50 MCG/0.5ML IM SUSR: INTRAMUSCULAR | 1 refills | Status: AC

## 2023-03-15 NOTE — Assessment & Plan Note (Addendum)
Reasonable control with tramadol.  Continue this medication.  She is tolerating it well.  Has upcoming appointment with neurosurgery.

## 2023-03-15 NOTE — Progress Notes (Signed)
  Date of Visit: 03/15/2023   SUBJECTIVE:   HPI:  Raven [name] presents today for follow-up.  Chronic pain: Taking tramadol 50 mg 3 times a day.  Also has baclofen 10 mg twice a day as needed, though has been unsure how to take that along with the tramadol.  Denies excessive sedation from it.  Does report that the tramadol helps a lot, especially when she takes it 3 times a day.  She missed her appointment with pain management and decided not to go, but has an upcoming appointment on November 1 with neurosurgery to discuss injections.  Otherwise she is doing well.   OBJECTIVE:   BP 126/62   Pulse 81   Wt 153 lb (69.4 kg)   SpO2 96%   BMI 27.98 kg/m  Gen: No acute distress, pleasant, cooperative, well-appearing HEENT: Normocephalic, atraumatic Heart: Regular rate and rhythm, no murmur Lungs: Clear to auscultation bilaterally, normal effort Neuro: Grossly nonfocal, speech normal Ext: Full strength with hip AB abduction and adduction bilaterally, hip flexion bilaterally, knee extension bilaterally  ASSESSMENT/PLAN:   Assessment & Plan Routine adult health maintenance Discussed shingles vaccine, prescription given, she plans to get this Otherwise up-to-date on health maintenance items Encounter for chronic pain management Reasonable control with tramadol.  Continue this medication.  She is tolerating it well.  Has upcoming appointment with neurosurgery.    FOLLOW UP: Follow up in 1 month for chronic pain  [place] J. Pollie Meyer, MD [facility]

## 2023-03-15 NOTE — Assessment & Plan Note (Signed)
Discussed shingles vaccine, prescription given, she plans to get this Otherwise up-to-date on health maintenance items

## 2023-03-15 NOTE — Patient Instructions (Signed)
It was great to see you again today.  Take shingles vaccine prescription to your pharmacy  Refilled tramadol Follow up in 1 month as scheduled  Be well, Dr. Pollie Meyer

## 2023-04-23 ENCOUNTER — Other Ambulatory Visit: Payer: Self-pay | Admitting: Family Medicine

## 2023-04-23 ENCOUNTER — Encounter: Payer: Self-pay | Admitting: Family Medicine

## 2023-04-23 ENCOUNTER — Ambulatory Visit (INDEPENDENT_AMBULATORY_CARE_PROVIDER_SITE_OTHER): Payer: Medicare HMO | Admitting: Family Medicine

## 2023-04-23 VITALS — BP 110/72 | HR 87 | Ht 62.0 in | Wt 152.0 lb

## 2023-04-23 DIAGNOSIS — G8929 Other chronic pain: Secondary | ICD-10-CM | POA: Diagnosis not present

## 2023-04-23 DIAGNOSIS — L811 Chloasma: Secondary | ICD-10-CM

## 2023-04-23 DIAGNOSIS — M5416 Radiculopathy, lumbar region: Secondary | ICD-10-CM | POA: Diagnosis not present

## 2023-04-23 MED ORDER — TRAMADOL HCL 50 MG PO TABS: 50.0000 mg | ORAL_TABLET | Freq: Three times a day (TID) | ORAL | 3 refills | Status: DC | PRN

## 2023-04-23 MED ORDER — KETOROLAC TROMETHAMINE 30 MG/ML IJ SOLN
30.0000 mg | Freq: Once | INTRAMUSCULAR | Status: AC
Start: 2023-04-23 — End: 2023-04-23
  Administered 2023-04-23: 30 mg via INTRAMUSCULAR

## 2023-04-23 MED ORDER — HYDROQUINONE 4 % EX CREA: TOPICAL_CREAM | Freq: Two times a day (BID) | CUTANEOUS | 0 refills | Status: DC

## 2023-04-23 NOTE — Progress Notes (Unsigned)
  Date of Visit: 04/23/2023   SUBJECTIVE:   HPI:  Raven [name] presents today for routine follow-up.  Chronic pain: Requests refill of tramadol.  Taking 50 mg 3 times a day as needed.  Tolerating this medication well.  Storing it safely out of reach of children.  It helps her be functional and helps her feel better.  Denies any side effects from the medicine.  Also would like a shot of Toradol today.  Has upcoming appointment with PM&R/neurosurgery to discuss back injections.  Dark spots on face: Would like a prescription for hydroquinone 4% cream.  A family member got this for their melasma and noted significant improvement.  She would like to try it as well.   OBJECTIVE:   BP 110/72   Pulse 87   Ht 5\' 2"  (1.575 m)   Wt 152 lb (68.9 kg)   SpO2 99%   BMI 27.80 kg/m  Gen: No acute distress, pleasant, cooperative, well-appearing HEENT: Normocephalic, atraumatic Heart:Traumatic regular rate and rhythm, no murmur Lungs: Clear to auscultation bilaterally, normal effort Neuro: Grossly nonfocal, speech normal Ext: No edema Skin: Hyperpigmented areas on face, see photo below       ASSESSMENT/PLAN:   Assessment & Plan Encounter for chronic pain management Pain overall stable.  Follow-up as scheduled with physiatry/neurosurgery Toradol shot today per patient request Refill tramadol Follow-up with me in 2 months Melasma Reasonable to trial hydroquinone for under 6 months duration.  Prescription sent in.    FOLLOW UP: Follow up in 2 months for above issues  Raven J. Pollie Meyer, MD [facility]

## 2023-04-23 NOTE — Patient Instructions (Addendum)
It was great to see you again today.  Refilled tramadol Toradol shot today Sent in prescription for your hydroquinone Can use small amount twice daily on your face to lighten the dark spots, limit to 6 months of use  Be well, Dr. Pollie Meyer

## 2023-04-25 DIAGNOSIS — L811 Chloasma: Secondary | ICD-10-CM | POA: Insufficient documentation

## 2023-04-25 NOTE — Assessment & Plan Note (Signed)
Reasonable to trial hydroquinone for under 6 months duration.  Prescription sent in.

## 2023-04-25 NOTE — Assessment & Plan Note (Signed)
Pain overall stable.  Follow-up as scheduled with physiatry/neurosurgery Toradol shot today per patient request Refill tramadol Follow-up with me in 2 months

## 2023-04-27 ENCOUNTER — Other Ambulatory Visit: Payer: Self-pay

## 2023-04-29 MED ORDER — BACLOFEN 10 MG PO TABS: 10.0000 mg | ORAL_TABLET | Freq: Two times a day (BID) | ORAL | 1 refills | Status: DC | PRN

## 2023-04-30 ENCOUNTER — Other Ambulatory Visit: Payer: Self-pay | Admitting: Family Medicine

## 2023-04-30 DIAGNOSIS — Z1231 Encounter for screening mammogram for malignant neoplasm of breast: Secondary | ICD-10-CM

## 2023-05-01 DIAGNOSIS — M5416 Radiculopathy, lumbar region: Secondary | ICD-10-CM | POA: Diagnosis not present

## 2023-05-01 DIAGNOSIS — Z6826 Body mass index (BMI) 26.0-26.9, adult: Secondary | ICD-10-CM | POA: Diagnosis not present

## 2023-05-02 ENCOUNTER — Other Ambulatory Visit: Payer: Self-pay | Admitting: Neurosurgery

## 2023-05-02 DIAGNOSIS — M5416 Radiculopathy, lumbar region: Secondary | ICD-10-CM

## 2023-05-03 ENCOUNTER — Encounter: Payer: Self-pay | Admitting: Neurosurgery

## 2023-06-13 ENCOUNTER — Ambulatory Visit
Admission: RE | Admit: 2023-06-13 | Discharge: 2023-06-13 | Disposition: A | Discharge status: Home / Self Care | Payer: Medicare HMO | Source: Ambulatory Visit | Attending: Family Medicine | Admitting: Family Medicine

## 2023-06-13 DIAGNOSIS — Z1231 Encounter for screening mammogram for malignant neoplasm of breast: Secondary | ICD-10-CM | POA: Diagnosis not present

## 2023-06-19 ENCOUNTER — Ambulatory Visit
Admission: RE | Admit: 2023-06-19 | Discharge: 2023-06-19 | Disposition: A | Discharge status: Home / Self Care | Payer: Medicare HMO | Source: Ambulatory Visit | Attending: Neurosurgery | Admitting: Neurosurgery

## 2023-06-19 DIAGNOSIS — M5416 Radiculopathy, lumbar region: Secondary | ICD-10-CM

## 2023-06-26 ENCOUNTER — Ambulatory Visit (INDEPENDENT_AMBULATORY_CARE_PROVIDER_SITE_OTHER): Payer: Medicare HMO

## 2023-06-26 VITALS — BP 110/54 | HR 93 | Ht 62.0 in | Wt 153.2 lb

## 2023-06-26 DIAGNOSIS — J111 Influenza due to unidentified influenza virus with other respiratory manifestations: Secondary | ICD-10-CM

## 2023-06-26 NOTE — Progress Notes (Signed)
    SUBJECTIVE:   CHIEF COMPLAINT / HPI:   Had a cold started last Wednesday. Was having headaches. Right ear was hurting. Had some coughing. Throat was sore.  No known fevers. Drinking normal amount. No N/V/D. Grandson went to ED Sunday Tested positive for Flu. Symptoms have now mostly resolved.  PERTINENT  PMH / PSH: HTN, Asthma, GERD, Hx of Smoking.  OBJECTIVE:   BP (!) 110/54   Pulse 93   Ht 5' 2 (1.575 m)   Wt 153 lb 3.2 oz (69.5 kg)   SpO2 97%   BMI 28.02 kg/m   General: NAD, well appearing HEENT: bilateral normal TM, MMM, no cervical lymphadenopathy Neuro: A&O Cardiovascular: RRR, no murmurs, no peripheral edema Respiratory: normal WOB on RA, CTAB, no wheezes, ronchi or rales Abdomen: soft, NTTP, no rebound or guarding Extremities: Moving all 4 extremities equally   ASSESSMENT/PLAN:   Assessment & Plan Influenza Consistent with viral upper respiratory infection. VSS, and exam is reassuring with low suspicion for AOM, pneumonia, requiring antibiotic treatment. Given known positive flu contact in household this is likely influenza. Discussed supportive care with patient and discussed strict return precautions for dehydration and difficulty breathing listed in the AVS.    Return in about 3 months (around 09/23/2023).  Ozell Provencal, MD [facility]

## 2023-06-26 NOTE — Patient Instructions (Signed)
It was great to see you! Thank you for allowing me to participate in your care!  Our plans for today:  - You have a viral illness causing your symptoms. - This will get better in the next several days. - You may use Tylenol and Ibuprofen as needed for pain. - Over the counter allergy medicine such as Claritin and Flonase may help with your congestion. - Cough drops may help with your throat and cough. - If you do not get better in the next 5 days please return to care. - It is important to stay hydrated, and continue eating while sick.    Please arrive 15 minutes PRIOR to your next scheduled appointment time! If you do not, this affects OTHER patients' care.  Take care and seek immediate care sooner if you develop any concerns.   Celine Mans, MD, PGY-2 [facility] 8:53 AM 06/26/2023  [facility]

## 2023-07-09 ENCOUNTER — Ambulatory Visit (INDEPENDENT_AMBULATORY_CARE_PROVIDER_SITE_OTHER): Payer: Medicare HMO | Admitting: Family Medicine

## 2023-07-09 ENCOUNTER — Encounter: Payer: Self-pay | Admitting: Family Medicine

## 2023-07-09 VITALS — BP 110/62 | HR 89 | Ht 62.0 in | Wt 155.0 lb

## 2023-07-09 DIAGNOSIS — N950 Postmenopausal bleeding: Secondary | ICD-10-CM | POA: Diagnosis not present

## 2023-07-09 DIAGNOSIS — N2889 Other specified disorders of kidney and ureter: Secondary | ICD-10-CM

## 2023-07-09 NOTE — Progress Notes (Unsigned)
  Date of Visit: 07/09/2023   SUBJECTIVE:   HPI:  Raven [name] presents today for discussion of findings on recent MRI.  MRI findings: Recently had lumbar spine MRI done, ordered by pain management physician at neurosurgery office in order to set her up for steroid injections.  On that study, noted renal mass on the right side, consistent with possible renal cyst but recommended ultrasound follow-up.  Postmenopausal spotting - had some spotting on toilet paper last week. Lasted only for a week, not every day. No cramping or pain.  Has not had any vaginal bleeding in many years since onset of menopause.  OBJECTIVE:   BP 110/62   Pulse 89   Ht 5\' 2"  (1.575 m)   Wt 155 lb (70.3 kg)   SpO2 100%   BMI 28.35 kg/m  Gen: No acute distress, pleasant, cooperative, well-appearing HEENT: normocephalic, atraumatic Heart: Regular rate and rhythm, no murmur Lungs: Clear bilaterally, normal effort Neuro: Grossly nonfocal, speech normal   ASSESSMENT/PLAN:   Assessment & Plan Right renal mass Discussed potential etiologies, need for further evaluation.  Renal ultrasound ordered and scheduled. Postmenopausal bleeding Newly endorsed today.  Discussed need for further workup to rule out malignancy.  Recommend pelvic ultrasound and endometrial biopsy.  Patient adamantly declines pelvic ultrasound but is amenable to undergoing endometrial biopsy with Dr. Jennette [name].  Scheduled in colpo clinic next Thursday.    FOLLOW UP: Follow up next week for EMB  Raven J. Pollie Meyer, MD [facility]

## 2023-07-09 NOTE — Patient Instructions (Signed)
 It was great to see you again today.  Scheduled ultrasound of your right kidney  Come back next Thursday to see Dr. Jennette Kettle for your endometrial biopsy  Be well, Dr. Pollie Meyer

## 2023-07-10 ENCOUNTER — Ambulatory Visit
Admission: RE | Admit: 2023-07-10 | Discharge: 2023-07-10 | Discharge status: Home / Self Care | Payer: Medicare HMO | Source: Ambulatory Visit | Attending: Family Medicine | Admitting: Family Medicine

## 2023-07-10 DIAGNOSIS — N2889 Other specified disorders of kidney and ureter: Secondary | ICD-10-CM

## 2023-07-10 DIAGNOSIS — N289 Disorder of kidney and ureter, unspecified: Secondary | ICD-10-CM | POA: Diagnosis not present

## 2023-07-11 DIAGNOSIS — N950 Postmenopausal bleeding: Secondary | ICD-10-CM | POA: Insufficient documentation

## 2023-07-11 NOTE — Assessment & Plan Note (Signed)
 Newly endorsed today.  Discussed need for further workup to rule out malignancy.  Recommend pelvic ultrasound and endometrial biopsy.  Patient adamantly declines pelvic ultrasound but is amenable to undergoing endometrial biopsy with Dr. Jennette Kettle.  Scheduled in colpo clinic next Thursday.

## 2023-07-16 DIAGNOSIS — M5416 Radiculopathy, lumbar region: Secondary | ICD-10-CM | POA: Diagnosis not present

## 2023-07-16 DIAGNOSIS — Z6826 Body mass index (BMI) 26.0-26.9, adult: Secondary | ICD-10-CM | POA: Diagnosis not present

## 2023-07-19 ENCOUNTER — Ambulatory Visit (INDEPENDENT_AMBULATORY_CARE_PROVIDER_SITE_OTHER): Payer: Medicare HMO | Admitting: Family Medicine

## 2023-07-19 VITALS — BP 138/74 | HR 88 | Wt 157.0 lb

## 2023-07-19 DIAGNOSIS — N950 Postmenopausal bleeding: Secondary | ICD-10-CM

## 2023-07-19 NOTE — Patient Instructions (Addendum)
 It was great to see you today! Thank you for choosing [facility] for your primary care. Raven [name] was seen for postmenopausal bleeding.  We have ordered a pelvic ultrasound for you to get at [facility]. We will let you know of the results and proceed from there. If you begin having more persistent bleeding, heavy bleeding, or any other concerns please call us.   We are checking some labs today. I will send you a MyChart message with your results, per your preference. If you do not hear about your labs in the next 2 weeks, please call the office.  If you haven't already, sign up for My Chart to have easy access to your labs results, and communication with your primary care physician.  Call the clinic at [phone] if your symptoms worsen or you have any concerns.  You should return to our clinic Return in about 3 months (around 10/16/2023). Please arrive 15 minutes before your appointment to ensure smooth check in process.  We appreciate your efforts in making this happen.  Thank you for allowing me to participate in your care, Para March, DO 07/19/2023, 9:35 AM PGY-1, [facility]

## 2023-07-19 NOTE — Progress Notes (Signed)
    SUBJECTIVE:   CHIEF COMPLAINT / HPI:   Presented for EMB Has had 1 episode of spotting on tissue paper after wiping No other vaginal bleeding or pain Reports that she has not had pelvic ultrasound yet   OBJECTIVE:   BP 138/74   Pulse 88   Wt 157 lb (71.2 kg)   SpO2 94%   BMI 28.72 kg/m   Gen: well appearing, no acute distress  ASSESSMENT/PLAN:   Postmenopausal bleeding Utilizing shared decision making with patient, since she has not had her pelvic ultrasound yet and only 1 episode of spotting she opts to get the ultrasound first and then if that is abnormal we will proceed with a biopsy afterwards. Pelvic US scheduled Return precautions discussed, pt will call with any worsening or persistent bleeding or new symptoms     Para March, DO [facility]

## 2023-07-19 NOTE — Assessment & Plan Note (Addendum)
 Utilizing shared decision making with patient, since she has not had her pelvic ultrasound yet and only 1 episode of spotting she opts to get the ultrasound first and then if that is abnormal we will proceed with a biopsy afterwards. Pelvic US scheduled Return precautions discussed, pt will call with any worsening or persistent bleeding or new symptoms

## 2023-07-24 ENCOUNTER — Ambulatory Visit
Admission: RE | Admit: 2023-07-24 | Discharge: 2023-07-24 | Disposition: A | Discharge status: Home / Self Care | Payer: Medicare HMO | Source: Ambulatory Visit | Attending: Family Medicine

## 2023-07-24 DIAGNOSIS — N950 Postmenopausal bleeding: Secondary | ICD-10-CM | POA: Diagnosis not present

## 2023-07-24 DIAGNOSIS — N83201 Unspecified ovarian cyst, right side: Secondary | ICD-10-CM | POA: Diagnosis not present

## 2023-08-01 DIAGNOSIS — M5416 Radiculopathy, lumbar region: Secondary | ICD-10-CM | POA: Diagnosis not present

## 2023-08-03 ENCOUNTER — Encounter: Payer: Self-pay | Admitting: Family Medicine

## 2023-08-03 ENCOUNTER — Telehealth: Payer: Self-pay | Admitting: Family Medicine

## 2023-08-03 NOTE — Telephone Encounter (Signed)
 Tried to call her re her PUS results. A non specific voice mail came on so I did not leave a message. Will retry.  PUS 6 mm and had one episode of vaginal bleeding so we need to discuss next steps.

## 2023-08-14 ENCOUNTER — Encounter: Payer: Self-pay | Admitting: Family Medicine

## 2023-08-14 NOTE — Telephone Encounter (Signed)
 Have tried to call multiple times, no vmbox set up now. Will send her letter. Recommend FU visit with me in [facility] clinic in 2-3 months. We can consider whether she has had any additional bleeding; do we \\need  to repeat PUS as he endometrial stripe on borderline at 6 mm.

## 2023-08-22 ENCOUNTER — Other Ambulatory Visit: Payer: Self-pay | Admitting: Family Medicine

## 2023-08-31 ENCOUNTER — Encounter: Payer: Self-pay | Admitting: Family Medicine

## 2023-08-31 DIAGNOSIS — N281 Cyst of kidney, acquired: Secondary | ICD-10-CM | POA: Insufficient documentation

## 2023-09-24 ENCOUNTER — Encounter: Payer: Self-pay | Admitting: Family Medicine

## 2023-09-24 ENCOUNTER — Ambulatory Visit (INDEPENDENT_AMBULATORY_CARE_PROVIDER_SITE_OTHER): Admitting: Family Medicine

## 2023-09-24 VITALS — BP 139/73 | HR 82 | Ht 63.0 in | Wt 157.6 lb

## 2023-09-24 DIAGNOSIS — L811 Chloasma: Secondary | ICD-10-CM

## 2023-09-24 DIAGNOSIS — M5416 Radiculopathy, lumbar region: Secondary | ICD-10-CM | POA: Diagnosis not present

## 2023-09-24 DIAGNOSIS — E785 Hyperlipidemia, unspecified: Secondary | ICD-10-CM | POA: Diagnosis not present

## 2023-09-24 DIAGNOSIS — I10 Essential (primary) hypertension: Secondary | ICD-10-CM | POA: Diagnosis not present

## 2023-09-24 DIAGNOSIS — G8929 Other chronic pain: Secondary | ICD-10-CM

## 2023-09-24 DIAGNOSIS — N281 Cyst of kidney, acquired: Secondary | ICD-10-CM | POA: Diagnosis not present

## 2023-09-24 DIAGNOSIS — Z0000 Encounter for general adult medical examination without abnormal findings: Secondary | ICD-10-CM

## 2023-09-24 MED ORDER — ATORVASTATIN CALCIUM 40 MG PO TABS: 40.0000 mg | ORAL_TABLET | Freq: Every day | ORAL | 0 refills | Status: DC

## 2023-09-24 MED ORDER — TRAMADOL HCL 50 MG PO TABS
50.0000 mg | ORAL_TABLET | Freq: Three times a day (TID) | ORAL | 3 refills | Status: AC | PRN
Start: 2023-09-24 — End: ?

## 2023-09-24 MED ORDER — HYDROCORTISONE 1 % EX OINT: 1.0000 | TOPICAL_OINTMENT | Freq: Two times a day (BID) | CUTANEOUS | 1 refills | Status: DC | PRN

## 2023-09-24 NOTE — Assessment & Plan Note (Signed)
-   Well-controlled, continue current regimen  - Check BMP today

## 2023-09-24 NOTE — Assessment & Plan Note (Signed)
 Reviewed results with patient.  Needs follow-up ultrasound in August Ordered and scheduled today

## 2023-09-24 NOTE — Patient Instructions (Signed)
 It was great to see you again today.  Checking kidney function and cholesterol today Sent in more tramadol  and Lipitor for you, along with hydrocortisone  cream Scheduling kidney ultrasound August or later  Get your Shingrix  vaccine at your pharmacy Stop ibuprofen   Follow-up with me in 3 to 6 months, sooner if needed  Be well, Dr. Dawn Eth

## 2023-09-24 NOTE — Progress Notes (Signed)
  Date of Visit: 09/24/2023   SUBJECTIVE:   HPI:  Raven [name] presents today for routine follow-up.  Chronic back pain: Taking tramadol  50 mg 3 times a day as needed.  Rarely if ever takes ibuprofen , wants to stop this.  Due for her next back injection in July, this helped her a lot.  Denies any side effects from the tramadol .  It is working well for her.  Melasma: Previously prescribed hydroquinone  for her, but she states she is almost never using this.  Wants to stop it altogether.  Grief: Mom recently died earlier this week, traveling to [place] with family this coming weekend for the funeral.  She is 1 of 8 children, 5 of them remain.  Overall seems to be coping well.  Hyperlipidemia: Currently taking atorvastatin  40 mg daily.  Fasting today.  Hypertension: Currently taking lisinopril -HCTZ 20-25 mg half of a pill daily.  Tolerating this well  Hydrocortisone  refill: Uses this occasionally if she has itchy rashes, requests refill  Discussed results of renal ultrasound showing complex cyst  OBJECTIVE:   BP 139/73   Pulse 82   Ht 5\' 3"  (1.6 m)   Wt 157 lb 9.6 oz (71.5 kg)   SpO2 100%   BMI 27.92 kg/m  Gen: No acute distress, pleasant, cooperative, well-appearing HEENT: Normocephalic, atraumatic Heart: Regular rate and rhythm, no murmur Lungs: Clear bilaterally, normal effort Neuro: Grossly nonfocal, speech normal Ext: No edema  ASSESSMENT/PLAN:   Assessment & Plan Complex renal cyst Reviewed results with patient.  Needs follow-up ultrasound in August Ordered and scheduled today Routine adult health maintenance Reminded to get Shingrix  vaccine at her pharmacy Has upcoming annual wellness visit scheduled Encounter for chronic pain management Overall stable, doing quite well after injection Continue tramadol , refilled today Stopping ibuprofen  altogether per patient preference to minimize risk to GI and renal systems HYPERTENSION, BENIGN SYSTEMIC Well-controlled, continue  current regimen Check BMP today Hyperlipidemia, unspecified hyperlipidemia type Continue statin Lipids today Melasma Not taking hydroquinone , removed from her medication list    FOLLOW UP: Follow up in 3 to 6 months for above issues  [place] J. Dawn Eth, MD [facility]

## 2023-09-24 NOTE — Assessment & Plan Note (Signed)
 Not taking hydroquinone , removed from her medication list

## 2023-09-24 NOTE — Assessment & Plan Note (Signed)
 Reminded to get Shingrix  vaccine at her pharmacy Has upcoming annual wellness visit scheduled

## 2023-09-24 NOTE — Assessment & Plan Note (Signed)
Continue statin  Lipids today

## 2023-09-24 NOTE — Assessment & Plan Note (Signed)
 Overall stable, doing quite well after injection Continue tramadol , refilled today Stopping ibuprofen  altogether per patient preference to minimize risk to GI and renal systems

## 2023-10-10 ENCOUNTER — Other Ambulatory Visit: Payer: Self-pay | Admitting: Family Medicine

## 2023-11-05 DIAGNOSIS — M5416 Radiculopathy, lumbar region: Secondary | ICD-10-CM | POA: Diagnosis not present

## 2023-11-26 DIAGNOSIS — M5416 Radiculopathy, lumbar region: Secondary | ICD-10-CM | POA: Diagnosis not present

## 2023-12-02 ENCOUNTER — Other Ambulatory Visit: Payer: Self-pay | Admitting: Family Medicine

## 2023-12-03 ENCOUNTER — Other Ambulatory Visit: Payer: Self-pay | Admitting: Family Medicine

## 2023-12-16 ENCOUNTER — Other Ambulatory Visit: Payer: Self-pay | Admitting: Family Medicine

## 2023-12-26 ENCOUNTER — Encounter: Payer: Self-pay | Admitting: Family Medicine

## 2024-01-08 ENCOUNTER — Ambulatory Visit (INDEPENDENT_AMBULATORY_CARE_PROVIDER_SITE_OTHER): Admitting: Family Medicine

## 2024-01-08 ENCOUNTER — Encounter: Payer: Self-pay | Admitting: Family Medicine

## 2024-01-08 VITALS — BP 142/61 | HR 95 | Ht 63.0 in | Wt 162.0 lb

## 2024-01-08 DIAGNOSIS — N281 Cyst of kidney, acquired: Secondary | ICD-10-CM

## 2024-01-08 DIAGNOSIS — E785 Hyperlipidemia, unspecified: Secondary | ICD-10-CM

## 2024-01-08 DIAGNOSIS — I10 Essential (primary) hypertension: Secondary | ICD-10-CM

## 2024-01-08 DIAGNOSIS — N950 Postmenopausal bleeding: Secondary | ICD-10-CM | POA: Diagnosis not present

## 2024-01-08 DIAGNOSIS — M5416 Radiculopathy, lumbar region: Secondary | ICD-10-CM

## 2024-01-08 MED ORDER — KETOROLAC TROMETHAMINE 30 MG/ML IJ SOLN
30.0000 mg | Freq: Once | INTRAMUSCULAR | Status: AC
  Administered 2024-01-08: 30 mg via INTRAMUSCULAR

## 2024-01-08 NOTE — Assessment & Plan Note (Signed)
 Has follow up appointment tomorrow AM to reassess via ultrasound

## 2024-01-08 NOTE — Assessment & Plan Note (Signed)
 Slightly above goal  Patient will monitor at home intermittently Continue current medications

## 2024-01-08 NOTE — Patient Instructions (Addendum)
  Kidney US  tomorrow Return next week for labs Toradol  shot today Follow up in 3 months   Be well, Dr. Donah

## 2024-01-08 NOTE — Assessment & Plan Note (Signed)
 Stable Toradol  shot today Will see PM&R soon to evaluate for another epidural steroid injection Follow up in 3 months

## 2024-01-08 NOTE — Assessment & Plan Note (Signed)
 Check lipids at upcoming fasting lab visit Continue statin

## 2024-01-08 NOTE — Assessment & Plan Note (Signed)
 Confirmed with patient no further bleeding, it was one isolated incident of blood when she wiped She does not desire any further workup

## 2024-01-08 NOTE — Progress Notes (Signed)
  Date of Visit: 01/08/2024   SUBJECTIVE:   HPI:  Discussed the use of AI scribe software for clinical note transcription with the patient, who gave verbal consent to proceed.  History of Present Illness Raven [name] is a 73 year old female who presents for a Toradol  shot for pain relief.  Chronic back pain - Chronic back pain worsens with rainy weather - Previous back injection provided significant relief - Currently takes tramadol  three times daily without side effects and finds it effective - Uses Tylenol  as needed for pain - Discontinued ibuprofen  and does not want it on her medication list - Presents today for Toradol  injection for pain relief  Genitourinary bleeding - Single episode of blood on wiping - No further bleeding since the initial episode - No need for pads - Describes the episode as a one-time occurrence - Saw Dr. Rosalynn earlier this year and they decided not to do endometrial biopsy  Renal cyst surveillance - Scheduled for a kidney ultrasound tomorrow to follow up on a cyst  Hypertension - taking lisinopril -hydrochlorothiazide  20-25mg  0.5 tablet daily, tolerating well  Hyperlipidemia - taking atrovastatin 40mg  daily - due for lipids, however no phlebotomist in clinic today. Unable to go to [facility] directly.   OBJECTIVE:   BP (!) 142/61   Pulse 95   Ht 5' 3 (1.6 m)   Wt 162 lb (73.5 kg)   SpO2 100%   BMI 28.70 kg/m  Gen: no acute distress, pleasant, cooperative, well appearing HEENT: normocephalic, atraumatic  Heart: regular rate and rhythm, no murmur Lungs: clear to auscultation bilaterally, normal work of breathing  Neuro: alert speech normal grossly nonfocal Ext: No appreciable lower extremity edema bilaterally   ASSESSMENT/PLAN:   Assessment & Plan Lumbar back pain with radiculopathy affecting right lower extremity Stable Toradol  shot today Will see PM&R soon to evaluate for another epidural steroid injection Follow up in 3 months   Hyperlipidemia, unspecified hyperlipidemia type Check lipids at upcoming fasting lab visit Continue statin Complex renal cyst Has follow up appointment tomorrow AM to reassess via ultrasound Postmenopausal bleeding Confirmed with patient no further bleeding, it was one isolated incident of blood when she wiped She does not desire any further workup HYPERTENSION, BENIGN SYSTEMIC Slightly above goal  Patient will monitor at home intermittently Continue current medications      FOLLOW UP: Follow up in 3 mos for above issues  [place] J. Donah, MD [facility]

## 2024-01-09 ENCOUNTER — Ambulatory Visit
Admission: RE | Admit: 2024-01-09 | Discharge: 2024-01-09 | Disposition: A | Discharge status: Home / Self Care | Source: Ambulatory Visit | Attending: Family Medicine | Admitting: Family Medicine

## 2024-01-09 DIAGNOSIS — N281 Cyst of kidney, acquired: Secondary | ICD-10-CM | POA: Diagnosis not present

## 2024-01-15 ENCOUNTER — Other Ambulatory Visit: Payer: Self-pay

## 2024-01-15 DIAGNOSIS — E785 Hyperlipidemia, unspecified: Secondary | ICD-10-CM

## 2024-01-16 LAB — LIPID PANEL
Chol/HDL Ratio: 2.1 ratio (ref 0.0–4.4)
Cholesterol, Total: 167 mg/dL (ref 100–199)
HDL: 80 mg/dL (ref >39)
LDL Chol Calc (NIH): 75 mg/dL (ref 0–99)
Triglycerides: 63 mg/dL (ref 0–149)
VLDL Cholesterol Cal: 12 mg/dL (ref 5–40)

## 2024-01-16 LAB — CMP14+EGFR
ALT: 37 IU/L — ABNORMAL HIGH (ref 0–32)
AST: 41 IU/L — ABNORMAL HIGH (ref 0–40)
Albumin: 4.7 g/dL (ref 3.8–4.8)
Alkaline Phosphatase: 153 IU/L — ABNORMAL HIGH (ref 44–121)
BUN/Creatinine Ratio: 18 (ref 12–28)
BUN: 20 mg/dL (ref 8–27)
Bilirubin Total: 0.3 mg/dL (ref 0.0–1.2)
Calcium: 10.3 mg/dL (ref 8.7–10.3)
Chloride: 98 mmol/L (ref 96–106)
Creatinine, Ser: 1.09 mg/dL — ABNORMAL HIGH (ref 0.57–1.00)
Globulin, Total: 3.4 g/dL (ref 1.5–4.5)
Glucose: 85 mg/dL (ref 70–99)
Potassium: 4.9 mmol/L (ref 3.5–5.2)
Sodium: 135 mmol/L (ref 134–144)
Total Protein: 8.1 g/dL (ref 6.0–8.5)
eGFR: 53 mL/min/1.73 — ABNORMAL LOW (ref >59)

## 2024-01-28 ENCOUNTER — Ambulatory Visit

## 2024-01-28 VITALS — Ht 63.0 in | Wt 160.0 lb

## 2024-01-28 DIAGNOSIS — Z0000 Encounter for general adult medical examination without abnormal findings: Secondary | ICD-10-CM | POA: Diagnosis not present

## 2024-01-28 NOTE — Patient Instructions (Signed)
 [name],  Thank you for taking the time for your Medicare Wellness Visit. I appreciate your continued commitment to your health goals. Please review the care plan we discussed, and feel free to reach out if I can assist you further.  Medicare recommends these wellness visits once per year to help you and your care team stay ahead of potential health issues. These visits are designed to focus on prevention, allowing your provider to concentrate on managing your acute and chronic conditions during your regular appointments.  Please note that Annual Wellness Visits do not include a physical exam. Some assessments may be limited, especially if the visit was conducted virtually. If needed, we may recommend a separate in-person follow-up with your provider.  Ongoing Care Seeing your primary care provider every 3 to 6 months helps us  monitor your health and provide consistent, personalized care.   Referrals If a referral was made during today's visit and you haven't received any updates within two weeks, please contact the referred provider directly to check on the status.  Recommended Screenings:  Health Maintenance  Topic Date Due   Zoster (Shingles) Vaccine (1 of 2) Never done   Flu Shot  12/21/2023   COVID-19 Vaccine (8 - Pfizer risk 2024-25 season) 01/21/2024   DEXA scan (bone density measurement)  02/19/2024   Colon Cancer Screening  09/08/2024   Medicare Annual Wellness Visit  01/27/2025   Mammogram  06/12/2025   DTaP/Tdap/Td vaccine (3 - Td or Tdap) 02/14/2031   Pneumococcal Vaccine for age over 23  Completed   Hepatitis C Screening  Completed   HPV Vaccine  Aged Out   Meningitis B Vaccine  Aged Out       01/28/2024   12:09 PM  Advanced Directives  Does Patient Have a Medical Advance Directive? No  Would patient like information on creating a medical advance directive? No - Patient declined   Advance Care Planning is important because it: Ensures you receive medical care that  aligns with your values, goals, and preferences. Provides guidance to your family and loved ones, reducing the emotional burden of decision-making during critical moments.  Vision: Annual vision screenings are recommended for early detection of glaucoma, cataracts, and diabetic retinopathy. These exams can also reveal signs of chronic conditions such as diabetes and high blood pressure.  Dental: Annual dental screenings help detect early signs of oral cancer, gum disease, and other conditions linked to overall health, including heart disease and diabetes.  Please see the attached documents for additional preventive care recommendations.

## 2024-01-28 NOTE — Progress Notes (Signed)
 Because this visit was a virtual/telehealth visit,  certain criteria was not obtained, such a blood pressure, CBG if applicable, and timed get up and go. Any medications not marked as taking were not mentioned during the medication reconciliation part of the visit. Any vitals not documented were not able to be obtained due to this being a telehealth visit or patient was unable to self-report a recent blood pressure reading due to a lack of equipment at home via telehealth. Vitals that have been documented are verbally provided by the patient.   Subjective:   Raven [name] is a 73 y.o. who presents for a Medicare Wellness preventive visit.  As a reminder, Annual Wellness Visits don't include a physical exam, and some assessments may be limited, especially if this visit is performed virtually. We may recommend an in-person follow-up visit with your provider if needed.  Visit Complete: Virtual I connected with  Raven [name] on 01/28/24 by a audio enabled telemedicine application and verified that I am speaking with the correct person using two identifiers.  Patient Location: Home  Provider Location: Home Office  I discussed the limitations of evaluation and management by telemedicine. The patient expressed understanding and agreed to proceed.  Vital Signs: Because this visit was a virtual/telehealth visit, some criteria may be missing or patient reported. Any vitals not documented were not able to be obtained and vitals that have been documented are patient reported.  VideoDeclined- This patient declined Librarian, academic. Therefore the visit was completed with audio only.  Persons Participating in Visit: Patient.  AWV Questionnaire: No: Patient Medicare AWV questionnaire was not completed prior to this visit.  Cardiac Risk Factors include: advanced age (>44men, >30 women);hypertension;dyslipidemia     Objective:    Today's Vitals   01/28/24 1205   Weight: 160 lb (72.6 kg)  Height: 5' 3 (1.6 m)  PainSc: 7   PainLoc: Back   Body mass index is 28.34 kg/m.     01/28/2024   12:09 PM 01/08/2024    8:38 AM 07/19/2023    9:12 AM 07/09/2023    9:54 AM 06/26/2023    8:30 AM 04/23/2023    9:16 AM 03/15/2023    8:24 AM  Advanced Directives  Does Patient Have a Medical Advance Directive? No No No No No No No  Would patient like information on creating a medical advance directive? No - Patient declined No - Patient declined No - Patient declined No - Patient declined No - Patient declined No - Patient declined No - Patient declined    Current Medications (verified) Outpatient Encounter Medications as of 01/28/2024  Medication Sig   acetaminophen  (TYLENOL ) 500 MG tablet Take 500 mg by mouth every 6 (six) hours as needed.   albuterol  (PROVENTIL ) (2.5 MG/3ML) 0.083% nebulizer solution INHALE THE CONTENTS OF 1 VIAL VIA NEBULIZER EVERY 6 HOURS AS NEEDED FOR SHORTNESS OF BREATH  OR  WHEEZING   albuterol  (VENTOLIN  HFA) 108 (90 Base) MCG/ACT inhaler INHALE 2 PUFFS INTO THE LUNGS EVERY 6 (SIX) HOURS AS NEEDED FOR WHEEZING OR SHORTNESS OF BREATH.   atorvastatin  (LIPITOR) 40 MG tablet TAKE 1 TABLET EVERY DAY   Cholecalciferol 25 MCG (1000 UT) tablet Take 1,000 Units by mouth daily.   lisinopril -hydrochlorothiazide  (ZESTORETIC ) 20-25 MG tablet TAKE 1/2 TABLET EVERY DAY   Multiple Vitamin (MULTIVITAMIN WITH MINERALS) TABS tablet Take 1 tablet by mouth daily.   polyethylene glycol powder (GLYCOLAX /MIRALAX ) powder Take 17 g by mouth 2 (two) times daily  as needed for mild constipation.   traMADol  (ULTRAM ) 50 MG tablet Take 1 tablet (50 mg total) by mouth every 8 (eight) hours as needed (pain).   Zoster Vaccine Adjuvanted (SHINGRIX ) injection Administer Shingrix  vaccination now and repeat in two months   No facility-administered encounter medications on file as of 01/28/2024.    Allergies (verified) Procaine hcl and Tomato   History: Past Medical History:   Diagnosis Date   Asthma    Chronic back pain    Chronic leg pain    left   Generalized headaches    ocasional, she uses naproxen .   GERD (gastroesophageal reflux disease)    Hx of adenomatous colonic polyps 09/11/2019   Hyperlipidemia    Hypertension    Left lumbar radiculopathy    Sciatica of left side    Spinal stenosis of lumbar region    Tobacco abuse    Past Surgical History:  Procedure Laterality Date   CESAREAN SECTION     x2   COLONOSCOPY  2021   Adenomas   TRACHEOSTOMY     when she was 20   Family History  Problem Relation Age of Onset   Colon polyps Maternal Uncle    Colon cancer Neg Hx    Esophageal cancer Neg Hx    Rectal cancer Neg Hx    Stomach cancer Neg Hx    Social History   Socioeconomic History   Marital status: Single    Spouse name: Not on file   Number of children: 5   Years of education: Not on file   Highest education level: Not on file  Occupational History   Occupation: Retired  Tobacco Use   Smoking status: Former    Current packs/day: 0.00    Types: Cigarettes    Quit date: 02/02/2020    Years since quitting: 3.9    Passive exposure: Past   Smokeless tobacco: Never  Vaping Use   Vaping status: Never Used  Substance and Sexual Activity   Alcohol use: Yes    Comment: occasional, holidays   Drug use: No   Sexual activity: Not on file  Other Topics Concern   Not on file  Social History Narrative   She is married, occasional alcohol, no drug use, she does smoke cigarettes.   Has at least 1 daughter   Social Drivers of Corporate investment banker Strain: Low Risk  (01/28/2024)   Overall Financial Resource Strain (CARDIA)    Difficulty of Paying Living Expenses: Not hard at all  Food Insecurity: No Food Insecurity (01/28/2024)   Hunger Vital Sign    Worried About Running Out of Food in the Last Year: Never true    Ran Out of Food in the Last Year: Never true  Transportation Needs: No Transportation Needs (01/28/2024)   PRAPARE -  Administrator, Civil Service (Medical): No    Lack of Transportation (Non-Medical): No  Physical Activity: Sufficiently Active (01/28/2024)   Exercise Vital Sign    Days of Exercise per Week: 5 days    Minutes of Exercise per Session: 30 min  Stress: No Stress Concern Present (01/28/2024)   [employer] of Occupational Health - Occupational Stress Questionnaire    Feeling of Stress: Not at all  Social Connections: Socially Isolated (01/28/2024)   Social Connection and Isolation Panel    Frequency of Communication with Friends and Family: More than three times a week    Frequency of Social Gatherings with Friends and Family: Never  Attends Religious Services: Never    Active Member of Clubs or Organizations: No    Attends Banker Meetings: Never    Marital Status: Never married    Tobacco Counseling Counseling given: Not Answered    Clinical Intake:  Pre-visit preparation completed: Yes  Pain : 0-10 Pain Score: 7  Pain Type: Chronic pain Pain Location: Back Pain Orientation: Lower     BMI - recorded: 28.34 Nutritional Status: BMI 25 -29 Overweight Nutritional Risks: None Diabetes: No  Lab Results  Component Value Date   HGBA1C 5.4 11/06/2013   HGBA1C 5.4 02/03/2013   HGBA1C  10/17/2010    5.1 (NOTE)                                                                       According to the ADA Clinical Practice Recommendations for 2011, when HbA1c is used as a screening test:   >=6.5%   Diagnostic of Diabetes Mellitus           (if abnormal result  is confirmed)  5.7-6.4%   Increased risk of developing Diabetes Mellitus  References:Diagnosis and Classification of Diabetes Mellitus,Diabetes Care,2011,34(Suppl 1):S62-S69 and Standards of Medical Care in         Diabetes - 2011,Diabetes Care,2011,34  (Suppl 1):S11-S61.     How often do you need to have someone help you when you read instructions, pamphlets, or other written materials from your  doctor or pharmacy?: 1 - Never  Interpreter Needed?: No  Information entered by :: Roz Fuller, LPN.   Activities of Daily Living     01/28/2024   12:09 PM  In your present state of health, do you have any difficulty performing the following activities:  Hearing? 0  Vision? 0  Difficulty concentrating or making decisions? 0  Walking or climbing stairs? 0  Dressing or bathing? 0  Doing errands, shopping? 0  Preparing Food and eating ? N  Using the Toilet? N  In the past six months, have you accidently leaked urine? N  Do you have problems with loss of bowel control? N  Managing your Medications? N  Managing your Finances? N  Housekeeping or managing your Housekeeping? N    Patient Care Team: Donah Laymon PARAS, MD as PCP - General (Family Medicine) Sheldon Standing, MD as Consulting Physician (General Surgery) Avram Lupita BRAVO, MD as Consulting Physician (Gastroenterology) Joshua Rush, OD (Optometry)  I have updated your Care Teams any recent Medical Services you may have received from other providers in the past year.     Assessment:   This is a routine wellness examination for Lashonne.  Hearing/Vision screen Hearing Screening - Comments:: Denies hearing difficulties.  Vision Screening - Comments:: Wears rx glasses - up to date with routine eye exams with [facility]    Goals Addressed             This Visit's Progress    01/28/2024: To stay alive and healthy.         Depression Screen     01/28/2024   12:11 PM 01/08/2024    8:38 AM 09/24/2023    8:25 AM 07/19/2023    9:16 AM 07/09/2023    9:54 AM 06/26/2023    8:50  AM 04/23/2023    9:16 AM  PHQ 2/9 Scores  PHQ - 2 Score 0   0 0 0 0  PHQ- 9 Score 0   5 5 3 5   Exception Documentation  Patient refusal Patient refusal        Fall Risk     01/28/2024   12:09 PM 09/24/2023    8:25 AM 07/09/2023    9:54 AM 06/26/2023    8:30 AM 04/23/2023    9:16 AM  Fall Risk   Falls in the past year? 0 0 0 0 0   Number falls in past yr: 0 0 0 0 0  Injury with Fall? 0 0 0 0 0  Risk for fall due to : No Fall Risks  No Fall Risks  No Fall Risks  Follow up Falls evaluation completed  Falls evaluation completed  Falls evaluation completed    MEDICARE RISK AT HOME:  Medicare Risk at Home Any stairs in or around the home?: No If so, are there any without handrails?: No Home free of loose throw rugs in walkways, pet beds, electrical cords, etc?: Yes Adequate lighting in your home to reduce risk of falls?: Yes Life alert?: No Use of a cane, walker or w/c?: No Grab bars in the bathroom?: No Shower chair or bench in shower?: No Elevated toilet seat or a handicapped toilet?: No  TIMED UP AND GO:  Was the test performed?  No  Cognitive Function: Declined/Normal: No cognitive concerns noted by patient or family. Patient alert, oriented, able to answer questions appropriately and recall recent events. No signs of memory loss or confusion.    01/28/2024   12:11 PM  MMSE - Mini Mental State Exam  Not completed: Unable to complete        01/28/2024   12:14 PM 07/03/2022    8:01 AM  6CIT Screen  What Year? 0 points 0 points  What month? 0 points 0 points  What time? 0 points 0 points  Count back from 20 0 points 0 points  Months in reverse 0 points 4 points  Repeat phrase 0 points 0 points  Total Score 0 points 4 points    Immunizations Immunization History  Administered Date(s) Administered   Fluad Quad(high Dose 65+) 02/05/2020, 02/08/2021, 04/10/2022   Fluad Trivalent(High Dose 65+) 02/12/2023   INFLUENZA, HIGH DOSE SEASONAL PF 03/04/2017   Influenza Split 07/02/2012   Influenza,inj,Quad PF,6+ Mos 04/01/2013, 04/01/2014, 03/04/2015, 04/12/2016, 02/04/2018, 02/10/2019   PFIZER Comirnaty(Gray Top)Covid-19 Tri-Sucrose Vaccine 10/20/2020   PFIZER(Purple Top)SARS-COV-2 Vaccination 07/18/2019, 08/12/2019, 04/21/2020   Pfizer Covid-19 Vaccine Bivalent Booster 81yrs & up 05/03/2021    Pfizer(Comirnaty)Fall Seasonal Vaccine 12 years and older 05/04/2022, 02/12/2023   Pneumococcal Conjugate-13 09/09/2015   Pneumococcal Polysaccharide-23 04/01/2014, 03/04/2017   Td 04/21/2001   Tdap 02/13/2021    Screening Tests Health Maintenance  Topic Date Due   Zoster Vaccines- Shingrix  (1 of 2) Never done   Influenza Vaccine  12/21/2023   COVID-19 Vaccine (8 - Pfizer risk 2024-25 season) 01/21/2024   DEXA SCAN  02/19/2024   Colonoscopy  09/08/2024   Medicare Annual Wellness (AWV)  01/27/2025   MAMMOGRAM  06/12/2025   DTaP/Tdap/Td (3 - Td or Tdap) 02/14/2031   Pneumococcal Vaccine: 50+ Years  Completed   Hepatitis C Screening  Completed   HPV VACCINES  Aged Out   Meningococcal B Vaccine  Aged Out    Health Maintenance Items Addressed:Yes Patient due for Shingrix , Flu and Covid vaccines.  Additional  Screening:  Vision Screening: Recommended annual ophthalmology exams for early detection of glaucoma and other disorders of the eye. Is the patient up to date with their annual eye exam?  Yes  Who is the provider or what is the name of the office in which the patient attends annual eye exams? [facility]  Dental Screening: Recommended annual dental exams for proper oral hygiene  Community Resource Referral / Chronic Care Management: CRR required this visit?  No   CCM required this visit?  No   Plan:    I have personally reviewed and noted the following in the patient's chart:   Medical and social history Use of alcohol, tobacco or illicit drugs  Current medications and supplements including opioid prescriptions. Patient is not currently taking opioid prescriptions. Functional ability and status Nutritional status Physical activity Advanced directives List of other physicians Hospitalizations, surgeries, and ER visits in previous 12 months Vitals Screenings to include cognitive, depression, and falls Referrals and appointments  In addition, I  have reviewed and discussed with patient certain preventive protocols, quality metrics, and best practice recommendations. A written personalized care plan for preventive services as well as general preventive health recommendations were provided to patient.   Roz LOISE Fuller, LPN   0/05/7972   After Visit Summary: (Declined) Due to this being a telephonic visit, with patients personalized plan was offered to patient but patient Declined AVS at this time   Notes: Please refer to Routing Comments.

## 2024-02-04 ENCOUNTER — Ambulatory Visit: Payer: Self-pay | Admitting: Family Medicine

## 2024-02-04 DIAGNOSIS — N2889 Other specified disorders of kidney and ureter: Secondary | ICD-10-CM

## 2024-02-04 NOTE — Telephone Encounter (Signed)
 Called to discuss results from labs & renal US  Complex cyst slightly larger in size, radiologist recommends MRI w/without contrast. Patient amenable, ordered.  LFTs mildly elevated, recommend repeating. Lab visit scheduled  Also scheduled RN visit that same day for flu shot  Patient appreciative  Raven JINNY Legions, MD

## 2024-02-12 ENCOUNTER — Ambulatory Visit: Payer: Self-pay

## 2024-02-12 ENCOUNTER — Other Ambulatory Visit: Payer: Self-pay

## 2024-02-12 DIAGNOSIS — Z23 Encounter for immunization: Secondary | ICD-10-CM

## 2024-02-12 DIAGNOSIS — N2889 Other specified disorders of kidney and ureter: Secondary | ICD-10-CM | POA: Diagnosis not present

## 2024-02-13 ENCOUNTER — Ambulatory Visit (HOSPITAL_COMMUNITY)

## 2024-02-13 ENCOUNTER — Telehealth: Payer: Self-pay

## 2024-02-13 LAB — CMP14+EGFR
ALT: 27 IU/L (ref 0–32)
AST: 29 IU/L (ref 0–40)
Albumin: 4.5 g/dL (ref 3.8–4.8)
Alkaline Phosphatase: 154 IU/L — ABNORMAL HIGH (ref 49–135)
BUN/Creatinine Ratio: 17 (ref 12–28)
BUN: 17 mg/dL (ref 8–27)
Bilirubin Total: <0.2 mg/dL (ref 0.0–1.2)
CO2: 22 mmol/L (ref 20–29)
Calcium: 9.9 mg/dL (ref 8.7–10.3)
Chloride: 99 mmol/L (ref 96–106)
Creatinine, Ser: 1 mg/dL (ref 0.57–1.00)
Globulin, Total: 2.9 g/dL (ref 1.5–4.5)
Glucose: 87 mg/dL (ref 70–99)
Potassium: 4.5 mmol/L (ref 3.5–5.2)
Sodium: 135 mmol/L (ref 134–144)
Total Protein: 7.4 g/dL (ref 6.0–8.5)
eGFR: 59 mL/min/1.73 — ABNORMAL LOW (ref >59)

## 2024-02-13 NOTE — Telephone Encounter (Signed)
 Good Morning!  Medicare denied MRI. I have faxed clinical information several times. I do not see on the web site if they have gotten the information.  I faxed it again this am. If you would like to do a peer to peer the information is below.  [phone]  Sherrilyn Nairn DOB February 16, 1951 Tracking number [id number] [employer] ID [id number]  If you do the peer to peer, please let me know if you get an approval number and validity dates.  Have a good day! Margit

## 2024-02-13 NOTE — Telephone Encounter (Signed)
 Did you include that the radiologist who read her ultrasound specifically recommended an MRI?  That would have been documented in a phone note, not office visit   Thanks! Laymon JINNY Legions, MD

## 2024-02-14 NOTE — Telephone Encounter (Signed)
 Ok, just to clarify - you sent that and they still denied it?

## 2024-02-15 ENCOUNTER — Other Ambulatory Visit: Payer: Self-pay | Admitting: Family Medicine

## 2024-02-15 NOTE — Progress Notes (Signed)
 Patient presents to nurse clinic for flu vaccination. Administered in LD. Patient tolerated well with no complications.   [name], RN

## 2024-02-20 ENCOUNTER — Ambulatory Visit (INDEPENDENT_AMBULATORY_CARE_PROVIDER_SITE_OTHER)

## 2024-02-20 DIAGNOSIS — Z23 Encounter for immunization: Secondary | ICD-10-CM | POA: Diagnosis not present

## 2024-02-20 NOTE — Progress Notes (Signed)
 Patient presents to nurse clinic for COVID vaccination.   Administered in RD with no complication.   Raven JAYSON English, RN

## 2024-02-22 ENCOUNTER — Ambulatory Visit: Payer: Self-pay | Admitting: Family Medicine

## 2024-02-22 NOTE — Telephone Encounter (Addendum)
 I just called, spent approximately 50 minutes on the phone with [employer], was transferred to approximately 6 different human beings before being told that a peer to peer was not possible as reportedly no appeal had been initiated and that has to happen first. I believe I initiated an expedited appeal. It was difficult to tell as the last person I spoke with had a phone that kept breaking up.   I provided the ultrasound findings and explained that this could represent malignancy, needing MRI to further assess. They requested we fax them and I told them these have been faxed multiple times already.  I believe they said we should hear something within 72 hours.  I am out of the office next week but Dr. Delores will be covering for me. Hopefully she does not have to endure a peer to peer.  Raven JINNY Legions, MD

## 2024-02-24 NOTE — Telephone Encounter (Signed)
 Received message from Dr. Donah-- MRI Approved   Auth # [id number]   Suzann Daring, MD  Family Medicine Teaching Service

## 2024-02-26 ENCOUNTER — Telehealth: Payer: Self-pay

## 2024-02-26 DIAGNOSIS — M48061 Spinal stenosis, lumbar region without neurogenic claudication: Secondary | ICD-10-CM | POA: Diagnosis not present

## 2024-02-26 DIAGNOSIS — M5416 Radiculopathy, lumbar region: Secondary | ICD-10-CM | POA: Diagnosis not present

## 2024-02-26 NOTE — Telephone Encounter (Signed)
 Accidentally created note, disregard

## 2024-02-26 NOTE — Telephone Encounter (Signed)
 Called patient multiple times she did not answer, reached out to her daughter she did not answer as well. LVM on patient's phone providing the appointment for MRI. Cassell Mary CMA

## 2024-02-28 NOTE — Progress Notes (Signed)
 Raven [name]                                          MRN: [id number]   02/28/2024   The [facility] Quality Team Specialist reviewed this patient medical record for the purposes of chart review for care gap closure. The following were reviewed: chart review for care gap closure-controlling blood pressure.    [facility] Quality Team

## 2024-03-04 ENCOUNTER — Ambulatory Visit (HOSPITAL_COMMUNITY)
Admission: RE | Admit: 2024-03-04 | Discharge: 2024-03-04 | Disposition: A | Discharge status: Home / Self Care | Source: Ambulatory Visit | Attending: Family Medicine | Admitting: Family Medicine

## 2024-03-04 ENCOUNTER — Other Ambulatory Visit: Payer: Self-pay | Admitting: Family Medicine

## 2024-03-04 DIAGNOSIS — N281 Cyst of kidney, acquired: Secondary | ICD-10-CM | POA: Diagnosis not present

## 2024-03-04 DIAGNOSIS — M5416 Radiculopathy, lumbar region: Secondary | ICD-10-CM

## 2024-03-04 DIAGNOSIS — R932 Abnormal findings on diagnostic imaging of liver and biliary tract: Secondary | ICD-10-CM | POA: Diagnosis not present

## 2024-03-04 DIAGNOSIS — K769 Liver disease, unspecified: Secondary | ICD-10-CM | POA: Diagnosis not present

## 2024-03-04 DIAGNOSIS — N2889 Other specified disorders of kidney and ureter: Secondary | ICD-10-CM | POA: Diagnosis not present

## 2024-03-04 DIAGNOSIS — K7689 Other specified diseases of liver: Secondary | ICD-10-CM | POA: Diagnosis not present

## 2024-03-04 MED ORDER — GADOBUTROL 1 MMOL/ML IV SOLN
7.0000 mL | Freq: Once | INTRAVENOUS | Status: AC | PRN
  Administered 2024-03-04: 7 mL via INTRAVENOUS

## 2024-03-06 DIAGNOSIS — M5416 Radiculopathy, lumbar region: Secondary | ICD-10-CM | POA: Diagnosis not present

## 2024-03-18 ENCOUNTER — Ambulatory Visit

## 2024-05-02 ENCOUNTER — Other Ambulatory Visit: Payer: Self-pay | Admitting: Family Medicine

## 2024-05-02 DIAGNOSIS — Z1231 Encounter for screening mammogram for malignant neoplasm of breast: Secondary | ICD-10-CM

## 2024-05-11 ENCOUNTER — Emergency Department (HOSPITAL_COMMUNITY)

## 2024-05-11 ENCOUNTER — Other Ambulatory Visit: Payer: Self-pay

## 2024-05-11 ENCOUNTER — Encounter (HOSPITAL_COMMUNITY): Payer: Self-pay

## 2024-05-11 ENCOUNTER — Observation Stay (HOSPITAL_COMMUNITY)
Admission: EM | Admit: 2024-05-11 | Discharge: 2024-05-13 | Disposition: A | Discharge status: Home / Self Care | Attending: Internal Medicine | Admitting: Internal Medicine

## 2024-05-11 DIAGNOSIS — Z87891 Personal history of nicotine dependence: Secondary | ICD-10-CM | POA: Diagnosis not present

## 2024-05-11 DIAGNOSIS — I129 Hypertensive chronic kidney disease with stage 1 through stage 4 chronic kidney disease, or unspecified chronic kidney disease: Secondary | ICD-10-CM | POA: Diagnosis not present

## 2024-05-11 DIAGNOSIS — D1803 Hemangioma of intra-abdominal structures: Secondary | ICD-10-CM | POA: Diagnosis not present

## 2024-05-11 DIAGNOSIS — J45909 Unspecified asthma, uncomplicated: Secondary | ICD-10-CM | POA: Diagnosis not present

## 2024-05-11 DIAGNOSIS — N1831 Chronic kidney disease, stage 3a: Secondary | ICD-10-CM | POA: Insufficient documentation

## 2024-05-11 DIAGNOSIS — R109 Unspecified abdominal pain: Secondary | ICD-10-CM | POA: Diagnosis present

## 2024-05-11 DIAGNOSIS — M5432 Sciatica, left side: Secondary | ICD-10-CM | POA: Diagnosis not present

## 2024-05-11 DIAGNOSIS — E785 Hyperlipidemia, unspecified: Secondary | ICD-10-CM | POA: Diagnosis not present

## 2024-05-11 DIAGNOSIS — M5430 Sciatica, unspecified side: Secondary | ICD-10-CM | POA: Diagnosis present

## 2024-05-11 DIAGNOSIS — K7689 Other specified diseases of liver: Secondary | ICD-10-CM | POA: Diagnosis not present

## 2024-05-11 DIAGNOSIS — K835 Biliary cyst: Secondary | ICD-10-CM | POA: Insufficient documentation

## 2024-05-11 DIAGNOSIS — R059 Cough, unspecified: Secondary | ICD-10-CM | POA: Diagnosis present

## 2024-05-11 DIAGNOSIS — Z79899 Other long term (current) drug therapy: Secondary | ICD-10-CM | POA: Insufficient documentation

## 2024-05-11 DIAGNOSIS — N281 Cyst of kidney, acquired: Secondary | ICD-10-CM | POA: Diagnosis not present

## 2024-05-11 DIAGNOSIS — M5416 Radiculopathy, lumbar region: Secondary | ICD-10-CM

## 2024-05-11 DIAGNOSIS — K529 Noninfective gastroenteritis and colitis, unspecified: Principal | ICD-10-CM | POA: Insufficient documentation

## 2024-05-11 DIAGNOSIS — R112 Nausea with vomiting, unspecified: Principal | ICD-10-CM | POA: Insufficient documentation

## 2024-05-11 DIAGNOSIS — I10 Essential (primary) hypertension: Secondary | ICD-10-CM | POA: Diagnosis present

## 2024-05-11 LAB — COMPREHENSIVE METABOLIC PANEL WITH GFR
ALT: 27 U/L (ref 0–44)
AST: 43 U/L — ABNORMAL HIGH (ref 15–41)
Albumin: 4.4 g/dL (ref 3.5–5.0)
Alkaline Phosphatase: 114 U/L (ref 38–126)
Anion gap: 15 (ref 5–15)
BUN: 23 mg/dL (ref 8–23)
CO2: 21 mmol/L — ABNORMAL LOW (ref 22–32)
Calcium: 9.8 mg/dL (ref 8.9–10.3)
Chloride: 97 mmol/L — ABNORMAL LOW (ref 98–111)
Creatinine, Ser: 1.16 mg/dL — ABNORMAL HIGH (ref 0.44–1.00)
GFR, Estimated: 49 mL/min — ABNORMAL LOW
Glucose, Bld: 93 mg/dL (ref 70–99)
Potassium: 3.7 mmol/L (ref 3.5–5.1)
Sodium: 133 mmol/L — ABNORMAL LOW (ref 135–145)
Total Bilirubin: 0.5 mg/dL (ref 0.0–1.2)
Total Protein: 8.2 g/dL — ABNORMAL HIGH (ref 6.5–8.1)

## 2024-05-11 LAB — CBC WITH DIFFERENTIAL/PLATELET
Abs Immature Granulocytes: 0.05 K/uL (ref 0.00–0.07)
Basophils Absolute: 0 K/uL (ref 0.0–0.1)
Basophils Relative: 1 %
Eosinophils Absolute: 0.1 K/uL (ref 0.0–0.5)
Eosinophils Relative: 1 %
HCT: 39.9 % (ref 36.0–46.0)
Hemoglobin: 13.5 g/dL (ref 12.0–15.0)
Immature Granulocytes: 1 %
Lymphocytes Relative: 17 %
Lymphs Abs: 1.1 K/uL (ref 0.7–4.0)
MCH: 31.1 pg (ref 26.0–34.0)
MCHC: 33.8 g/dL (ref 30.0–36.0)
MCV: 91.9 fL (ref 80.0–100.0)
Monocytes Absolute: 0.6 K/uL (ref 0.1–1.0)
Monocytes Relative: 10 %
Neutro Abs: 4.7 K/uL (ref 1.7–7.7)
Neutrophils Relative %: 70 %
Platelets: 302 K/uL (ref 150–400)
RBC: 4.34 MIL/uL (ref 3.87–5.11)
RDW: 13.2 % (ref 11.5–15.5)
WBC: 6.6 K/uL (ref 4.0–10.5)
nRBC: 0 % (ref 0.0–0.2)

## 2024-05-11 LAB — URINALYSIS, ROUTINE W REFLEX MICROSCOPIC
Bacteria, UA: NONE SEEN
Bilirubin Urine: NEGATIVE
Glucose, UA: NEGATIVE mg/dL
Ketones, ur: 20 mg/dL — AB
Leukocytes,Ua: NEGATIVE
Nitrite: NEGATIVE
Protein, ur: NEGATIVE mg/dL
Specific Gravity, Urine: >1.046 — ABNORMAL HIGH (ref 1.005–1.030)
pH: 5 (ref 5.0–8.0)

## 2024-05-11 LAB — RESP PANEL BY RT-PCR (RSV, FLU A&B, COVID)  RVPGX2
Influenza A by PCR: NEGATIVE
Influenza B by PCR: NEGATIVE
Resp Syncytial Virus by PCR: NEGATIVE
SARS Coronavirus 2 by RT PCR: NEGATIVE

## 2024-05-11 LAB — LIPASE, BLOOD: Lipase: 32 U/L (ref 11–51)

## 2024-05-11 MED ORDER — ENOXAPARIN SODIUM 40 MG/0.4ML IJ SOSY
40.0000 mg | PREFILLED_SYRINGE | Freq: Every day | INTRAMUSCULAR | Status: DC
  Administered 2024-05-11 – 2024-05-13 (×3): 40 mg via SUBCUTANEOUS
  Filled 2024-05-11 (×3): qty 0.4

## 2024-05-11 MED ORDER — IOHEXOL 300 MG/ML  SOLN
100.0000 mL | Freq: Once | INTRAMUSCULAR | Status: AC | PRN
  Administered 2024-05-11: 100 mL via INTRAVENOUS

## 2024-05-11 MED ORDER — FENTANYL CITRATE (PF) 50 MCG/ML IJ SOSY
12.5000 ug | PREFILLED_SYRINGE | INTRAMUSCULAR | Status: DC | PRN
  Administered 2024-05-12: 25 ug via INTRAVENOUS
  Filled 2024-05-11: qty 1

## 2024-05-11 MED ORDER — ONDANSETRON 4 MG PO TBDP
4.0000 mg | ORAL_TABLET | Freq: Once | ORAL | Status: AC
  Administered 2024-05-11: 4 mg via ORAL
  Filled 2024-05-11: qty 1

## 2024-05-11 MED ORDER — ONDANSETRON HCL 4 MG/2ML IJ SOLN: 4.0000 mg | Freq: Four times a day (QID) | INTRAMUSCULAR | Status: DC | PRN

## 2024-05-11 MED ORDER — METOCLOPRAMIDE HCL 5 MG/ML IJ SOLN: 10.0000 mg | Freq: Four times a day (QID) | INTRAMUSCULAR | Status: DC | PRN

## 2024-05-11 MED ORDER — HYDRALAZINE HCL 20 MG/ML IJ SOLN: 5.0000 mg | Freq: Four times a day (QID) | INTRAMUSCULAR | Status: DC | PRN

## 2024-05-11 MED ORDER — TRAZODONE HCL 50 MG PO TABS: 25.0000 mg | ORAL_TABLET | Freq: Every evening | ORAL | Status: DC | PRN

## 2024-05-11 MED ORDER — ONDANSETRON HCL 4 MG PO TABS
4.0000 mg | ORAL_TABLET | Freq: Four times a day (QID) | ORAL | Status: DC | PRN
  Administered 2024-05-13: 4 mg via ORAL
  Filled 2024-05-11: qty 1

## 2024-05-11 MED ORDER — ACETAMINOPHEN 650 MG RE SUPP: 650.0000 mg | Freq: Four times a day (QID) | RECTAL | Status: DC | PRN

## 2024-05-11 MED ORDER — ACETAMINOPHEN 325 MG PO TABS
650.0000 mg | ORAL_TABLET | Freq: Four times a day (QID) | ORAL | Status: DC | PRN
  Administered 2024-05-11 – 2024-05-13 (×5): 650 mg via ORAL
  Filled 2024-05-11 (×6): qty 2

## 2024-05-11 MED ORDER — SODIUM CHLORIDE 0.9 % IV BOLUS
1000.0000 mL | Freq: Once | INTRAVENOUS | Status: AC
  Administered 2024-05-11: 1000 mL via INTRAVENOUS

## 2024-05-11 MED ORDER — MORPHINE SULFATE (PF) 2 MG/ML IV SOLN
2.0000 mg | Freq: Once | INTRAVENOUS | Status: AC
  Administered 2024-05-11: 2 mg via INTRAVENOUS
  Filled 2024-05-11: qty 1

## 2024-05-11 MED ORDER — ALBUTEROL SULFATE (2.5 MG/3ML) 0.083% IN NEBU
2.5000 mg | INHALATION_SOLUTION | RESPIRATORY_TRACT | Status: DC | PRN
  Administered 2024-05-12: 2.5 mg via RESPIRATORY_TRACT
  Filled 2024-05-11: qty 3

## 2024-05-11 MED ORDER — METOCLOPRAMIDE HCL 5 MG/ML IJ SOLN
5.0000 mg | Freq: Once | INTRAMUSCULAR | Status: AC
  Administered 2024-05-11: 5 mg via INTRAVENOUS
  Filled 2024-05-11: qty 2

## 2024-05-11 MED ORDER — SODIUM CHLORIDE 0.9 % IV BOLUS
500.0000 mL | Freq: Once | INTRAVENOUS | Status: AC
  Administered 2024-05-11: 500 mL via INTRAVENOUS

## 2024-05-11 MED ORDER — LOPERAMIDE HCL 2 MG PO CAPS
4.0000 mg | ORAL_CAPSULE | Freq: Once | ORAL | Status: AC
  Administered 2024-05-11: 4 mg via ORAL
  Filled 2024-05-11: qty 2

## 2024-05-11 MED ORDER — LACTATED RINGERS IV SOLN: INTRAVENOUS | Status: AC

## 2024-05-11 NOTE — ED Provider Notes (Signed)
" °  Physical Exam  BP (!) 149/77   Pulse 82   Temp 98.5 F (36.9 C)   Resp 19   Ht 5' 3 (1.6 m)   Wt 72.6 kg   SpO2 100%   BMI 28.34 kg/m   Physical Exam Constitutional:      Appearance: She is ill-appearing.  Cardiovascular:     Rate and Rhythm: Normal rate and regular rhythm.  Pulmonary:     Effort: Pulmonary effort is normal. No respiratory distress.     Breath sounds: Normal breath sounds.  Abdominal:     General: Abdomen is flat. Bowel sounds are normal.     Palpations: Abdomen is soft.     Tenderness: There is abdominal tenderness.     Procedures  Procedures  ED Course / MDM   Clinical Course as of 05/11/24 0936  Sun May 11, 2024  0703 Received in sign out. Complains of cough, productive, NVD. Starting after suspicious food intake. Ongoing for 2 days. >10 V, >10 loose stools. Patient reassessed, reports she is very uncomfortable, complaining of right sided low back pain, generalize abdominal pain and she continues to have NV. Will trial reglan . Since still endorsing pain will add on CT abd/pelvis. Chest x-ray for cough.  [JB]  0757 Chest x-ray negative for acute findings.  [JB]  0919 CT abd/pelvis with no acute findings, multiple liver lesions appear hemangioma, bilateral renal cysts, arthrosclerosis and hyperplasia of left adrenal gland [JB]  0920 Failed PO challenge after 2 antiemetics.  [JB]  [id number] Added stool panel. Discussed admission with patient due to intractable NV. [JB]  [id number] Discussed with hospital team who agrees to come see patient. [JB]    Clinical Course User Index [JB] Undrea Archbold, Warren SAILOR, PA-C   Medical Decision Making Amount and/or Complexity of Data Reviewed Labs: ordered. Radiology: ordered.  Risk Prescription drug management. Decision regarding hospitalization.          Shermon Warren SAILOR, [place] 05/11/24 9062  "

## 2024-05-11 NOTE — ED Provider Notes (Signed)
 " [place] EMERGENCY DEPARTMENT AT [facility] Provider Note   CSN: [id number] Arrival date & time: 05/11/24  [id number]     Patient presents with: Cough   Raven [name] is a 73 y.o. female. with a history of hypertension and asthma who presents today with nausea. vomiting, diarrhea, diffuse abdominal pain that started 2 days ago.  She states she is unable to eat due to her symptoms.  Patient had [employer] a day before her symptoms started.  Denies recent travel.  Denies recent antibiotic use.  Denies sick contacts.  She also endorses a headache, body aches, and a cough.  No blood in her stool or vomit.  She reports approximately 4 episodes of vomiting and 4 episodes of loose diarrhea in the past 24 hours.  She denies fevers, chest pain, shortness of breath, weakness, numbness, or any other symptoms at this time.  Patient did not take her daily medications since past 2 days due to her symptoms.  No alcohol or drug use.      Cough Associated symptoms: headaches        Prior to Admission medications  Medication Sig Start Date End Date Taking? Authorizing Provider  acetaminophen  (TYLENOL ) 500 MG tablet Take 500 mg by mouth every 6 (six) hours as needed.    [provider]  albuterol  (PROVENTIL ) (2.5 MG/3ML) 0.083% nebulizer solution INHALE THE CONTENTS OF 1 VIAL VIA NEBULIZER EVERY 6 HOURS AS NEEDED FOR SHORTNESS OF BREATH  OR  WHEEZING 04/15/19   Donah Laymon PARAS, MD  albuterol  (VENTOLIN  HFA) [phone] Base) MCG/ACT inhaler INHALE 2 PUFFS INTO THE LUNGS EVERY 6 (SIX) HOURS AS NEEDED FOR WHEEZING OR SHORTNESS OF BREATH. 12/04/23   Donah Laymon PARAS, MD  atorvastatin  (LIPITOR) 40 MG tablet TAKE 1 TABLET EVERY DAY 12/17/23   Rumball, Alison M, DO  Cholecalciferol 25 MCG (1000 UT) tablet Take 1,000 Units by mouth daily.    [provider]  lisinopril -hydrochlorothiazide  (ZESTORETIC ) 20-25 MG tablet TAKE 1/2 TABLET EVERY DAY 02/20/24   McIntyre, Brittany J, MD   Multiple Vitamin (MULTIVITAMIN WITH MINERALS) TABS tablet Take 1 tablet by mouth daily.    [provider]  polyethylene glycol powder (GLYCOLAX /MIRALAX ) powder Take 17 g by mouth 2 (two) times daily as needed for mild constipation. 11/03/14   Donah Laymon PARAS, MD  traMADol  (ULTRAM ) 50 MG tablet TAKE 1 TABLET EVERY 8 HOURS AS NEEDED FOR PAIN 03/07/24   Donah Laymon PARAS, MD  Zoster Vaccine Adjuvanted (SHINGRIX ) injection Administer Shingrix  vaccination now and repeat in two months 03/15/23   Donah Laymon PARAS, MD    Allergies: Procaine hcl and Tomato    Review of Systems  Respiratory:  Positive for cough.   Gastrointestinal:  Positive for abdominal pain, diarrhea, nausea and vomiting.  Neurological:  Positive for headaches.    Updated Vital Signs BP (!) 156/113 (BP Location: Left Arm)   Pulse (!) 106   Temp 98.2 F (36.8 C) (Oral)   Resp 17   Ht 5' 3 (1.6 m)   Wt 72.6 kg   SpO2 100%   BMI 28.34 kg/m   Physical Exam Vitals and nursing note reviewed.  Constitutional:      General: She is not in acute distress.    Appearance: She is well-developed. She is not ill-appearing.  HENT:     Head: Normocephalic and atraumatic.  Eyes:     Conjunctiva/sclera: Conjunctivae normal.  Cardiovascular:     Rate and Rhythm: Regular rhythm. Tachycardia  present.     Heart sounds: No murmur heard. Pulmonary:     Effort: Pulmonary effort is normal. No respiratory distress.     Breath sounds: Normal breath sounds. No decreased breath sounds, wheezing, rhonchi or rales.  Abdominal:     Palpations: Abdomen is soft.     Tenderness: There is generalized abdominal tenderness.  Musculoskeletal:        General: No swelling.     Cervical back: Neck supple.  Skin:    General: Skin is warm and dry.     Capillary Refill: Capillary refill takes less than 2 seconds.  Neurological:     Mental Status: She is alert.  Psychiatric:        Mood and Affect: Mood normal.     (all  labs ordered are listed, but only abnormal results are displayed) Labs Reviewed  RESP PANEL BY RT-PCR (RSV, FLU A&B, COVID)  [id number]  CBC WITH DIFFERENTIAL/PLATELET  COMPREHENSIVE METABOLIC PANEL WITH GFR  URINALYSIS, ROUTINE W REFLEX MICROSCOPIC  LIPASE, BLOOD    EKG: None  Radiology: No results found.   Procedures   Medications Ordered in the ED  ondansetron  (ZOFRAN -ODT) disintegrating tablet 4 mg (4 mg Oral Given 05/11/24 0616)  loperamide  (IMODIUM ) capsule 4 mg (4 mg Oral Given 05/11/24 0616)  sodium chloride  0.9 % bolus 500 mL (500 mLs Intravenous New Bag/Given 05/11/24 0618)                                    Medical Decision Making Amount and/or Complexity of Data Reviewed Labs: ordered.  Risk Prescription drug management.    This patient presents to the ED for concern of nausea, vomiting, diarrhea, abdominal pain, this involves an extensive number of treatment options, and is a complaint that carries with it a high risk of complications and morbidity.  The differential diagnosis includes AAA, mesenteric ischemia, appendicitis, diverticulitis, gastroenteritis, influenza, nephrolithiasis, pancreatitis, UTI, bowel obstruction, biliary disease, IBD, PUD, hepatitis.   Co morbidities / Chronic conditions that complicate the patient evaluation  Hypertension and asthma   Additional history obtained:  Additional history obtained from EMR   Lab Tests:  I Ordered, and personally interpreted labs.  The pertinent results include: Negative COVID, influenza, RSV.  Negative CBC. Pending lab work at end of shift.     Problem List / ED Course / Critical interventions / Medication management  Patient presents today with nausea, vomiting, diarrhea, abdominal pain.  On exam there was diffuse abdominal tenderness with no guarding or rebound appreciated.  No obvious signs of dehydration.  She is also tachycardic and hypertensive.  Patient did note that she has not taken her  hypertension medication for the past 2 days due to her symptoms.  Patient is not in distress and is well-appearing overall.  Pending lab work.  I ordered medication including Zofran  and Imodium , and fluids    Accepted handoff at shift change from Barrett, Jamie PA-C. Please see prior provider note for more detail.   Briefly: Patient is 73 y.o. presents with nausea, vomiting, diarrhea, abdominal pain.  On exam she has diffuse abdominal tenderness with no guarding, rebound.  Overall she is not toxic appearing.  Negative respiratory panel. Negative CBC.   DDX: concern for gastroenteritis, dehydration  Plan: Can be discharged if labs are negative        Final diagnoses:  None    ED Discharge Orders  None          Braxton Dubois, PA-C 05/11/24 [id number]    Mannie Pac T, DO 05/11/24 [phone]  "

## 2024-05-11 NOTE — Plan of Care (Signed)

## 2024-05-11 NOTE — ED Notes (Signed)
 Fluid challenge complete. Pt able to keep fluids down but stated that she felt like she was going to gag and vomit. PA notified.

## 2024-05-11 NOTE — ED Triage Notes (Signed)
 Has been having a nonproductive cough and generalized body aches x 2 days. Nausea, vomiting and diarrhea beginning today.

## 2024-05-11 NOTE — H&P (Signed)
 " History and Physical  Raven [name] FMW:[id number] DOB: 19-Oct-1950 DOA: 05/11/2024  PCP: Donah Laymon PARAS, MD   Chief Complaint: Intractable nausea and vomiting  HPI: Raven [name] is a 73 y.o. female with medical history significant for GERD, hypertension, hyperlipidemia being admitted to the hospital with 3 days of intractable nausea and vomiting as well as diffuse abdominal pain after eating a [employer] a day before her symptoms started.  She had some subjective fevers at home, has been feeling very dehydrated, the last time she was able to keep down any solid or liquid food was the day she had [employer].  No sick contacts as far she knows, her family including her grandson ate with her, and he did not get sick.  Early this morning in the emergency department, she was p.o. challenged and vomited again and therefore hospitalist was contacted for observation admission since that time, per her bedside nurse she has tolerated some crackers and cheese without vomiting.  Review of Systems: Please see HPI for pertinent positives and negatives. A complete 10 system review of systems are otherwise negative.  Past Medical History:  Diagnosis Date   Asthma    Chronic back pain    Chronic leg pain    left   Generalized headaches    ocasional, she uses naproxen .   GERD (gastroesophageal reflux disease)    Hx of adenomatous colonic polyps 09/11/2019   Hyperlipidemia    Hypertension    Left lumbar radiculopathy    Sciatica of left side    Spinal stenosis of lumbar region    Tobacco abuse    Past Surgical History:  Procedure Laterality Date   CESAREAN SECTION     x2   COLONOSCOPY  2021   Adenomas   TRACHEOSTOMY     when she was 20   Social History:  reports that she quit smoking about 4 years ago. Her smoking use included cigarettes. She smoked an average of 0.3 packs per day. She has been exposed to tobacco smoke. She has never used smokeless tobacco. She reports current alcohol  use. She reports that she does not use drugs.  Allergies[1]  Family History  Problem Relation Age of Onset   Colon polyps Maternal Uncle    Colon cancer Neg Hx    Esophageal cancer Neg Hx    Rectal cancer Neg Hx    Stomach cancer Neg Hx      Prior to Admission medications  Medication Sig Start Date End Date Taking? Authorizing Provider  acetaminophen  (TYLENOL ) 500 MG tablet Take 500 mg by mouth every 6 (six) hours as needed.    [provider]  albuterol  (PROVENTIL ) (2.5 MG/3ML) 0.083% nebulizer solution INHALE THE CONTENTS OF 1 VIAL VIA NEBULIZER EVERY 6 HOURS AS NEEDED FOR SHORTNESS OF BREATH  OR  WHEEZING 04/15/19   Donah Laymon PARAS, MD  albuterol  (VENTOLIN  HFA) [phone] Base) MCG/ACT inhaler INHALE 2 PUFFS INTO THE LUNGS EVERY 6 (SIX) HOURS AS NEEDED FOR WHEEZING OR SHORTNESS OF BREATH. 12/04/23   Donah Laymon PARAS, MD  atorvastatin  (LIPITOR) 40 MG tablet TAKE 1 TABLET EVERY DAY 12/17/23   Rumball, Alison M, DO  Cholecalciferol 25 MCG (1000 UT) tablet Take 1,000 Units by mouth daily.    [provider]  lisinopril -hydrochlorothiazide  (ZESTORETIC ) 20-25 MG tablet TAKE 1/2 TABLET EVERY DAY 02/20/24   McIntyre, Brittany J, MD  Multiple Vitamin (MULTIVITAMIN WITH MINERALS) TABS tablet Take 1 tablet by mouth daily.    [provider]  polyethylene glycol powder (GLYCOLAX /MIRALAX ) powder Take 17 g by mouth 2 (two) times daily as needed for mild constipation. 11/03/14   Donah Laymon PARAS, MD  traMADol  (ULTRAM ) 50 MG tablet TAKE 1 TABLET EVERY 8 HOURS AS NEEDED FOR PAIN 03/07/24   Donah Laymon PARAS, MD  Zoster Vaccine Adjuvanted (SHINGRIX ) injection Administer Shingrix  vaccination now and repeat in two months 03/15/23   Donah Laymon PARAS, MD    Physical Exam: BP (!) 149/77   Pulse 82   Temp 98.5 F (36.9 C)   Resp 19   Ht 5' 3 (1.6 m)   Wt 72.6 kg   SpO2 100%   BMI 28.34 kg/m  General:  Alert, oriented, calm, in no acute distress  Eyes: EOMI,  clear conjuctivae, white sclerea Neck: supple, no masses, trachea mildline  Cardiovascular: RRR, no murmurs or rubs, no peripheral edema  Respiratory: clear to auscultation bilaterally, no wheezes, no crackles  Abdomen: soft, nontender, nondistended, normal bowel tones heard  Skin: dry, no rashes  Musculoskeletal: no joint effusions, normal range of motion  Psychiatric: appropriate affect, normal speech  Neurologic: extraocular muscles intact, clear speech, moving all extremities with intact sensorium         Labs on Admission:  Basic Metabolic Panel: Recent Labs  Lab 05/11/24 0610  NA 133*  K 3.7  CL 97*  CO2 21*  GLUCOSE 93  BUN 23  CREATININE 1.16*  CALCIUM  9.8   Liver Function Tests: Recent Labs  Lab 05/11/24 0610  AST 43*  ALT 27  ALKPHOS 114  BILITOT 0.5  PROT 8.2*  ALBUMIN 4.4   Recent Labs  Lab 05/11/24 0610  LIPASE 32   No results for input(s): AMMONIA in the last 168 hours. CBC: Recent Labs  Lab 05/11/24 0610  WBC 6.6  NEUTROABS 4.7  HGB 13.5  HCT 39.9  MCV 91.9  PLT 302   Cardiac Enzymes: No results for input(s): CKTOTAL, CKMB, CKMBINDEX, TROPONINI in the last 168 hours. BNP (last 3 results) No results for input(s): BNP in the last 8760 hours.  ProBNP (last 3 results) No results for input(s): PROBNP in the last 8760 hours.  CBG: No results for input(s): GLUCAP in the last 168 hours.  Radiological Exams on Admission: CT L-SPINE NO CHARGE Result Date: 05/11/2024 EXAM: CT OF THE LUMBAR SPINE WITHOUT CONTRAST 05/11/2024 07:53:49 AM TECHNIQUE: CT of the lumbar spine was performed without the administration of intravenous contrast. Multiplanar reformatted images are provided for review. Automated exposure control, iterative reconstruction, and/or weight based adjustment of the mA/kV was utilized to reduce the radiation dose to as low as reasonably achievable. COMPARISON: MRI of the lumbar spine dated 06/19/2023. CLINICAL HISTORY:  FINDINGS: BONES AND ALIGNMENT: Normal vertebral body heights. No acute fracture or suspicious bone lesion. Alignment: Grade 1 degenerative anterolisthesis at L3-L4 and L5-S1, as before. Slight retrolisthesis of L1 on L2. DEGENERATIVE CHANGES: Diffuse chronic degenerative disc disease and facet arthrosis throughout the lumbar spine. Specific findings per level: *   **L1-L2:** Slight retrolisthesis of L1 on L2. *   **L3-L4:** Broad-based disc bulging and marked bilateral facet arthrosis, causing moderate-to-severe central canal stenosis and bilateral lateral recess stenosis, worse on the left. Likely impingement of the L4 nerves in the lateral recesses and possibly the left L3 nerve in the neural foramen. *   **L4-L5:** Broad-based disc bulging and marked facet arthrosis, with severe central spinal canal stenosis and severe left neural foraminal stenosis. *   **L5-S1:** Marked bilateral facet arthrosis and mild-to-moderate  central spinal canal stenosis. Grade 1 degenerative anterolisthesis, as before. SOFT TISSUES: No acute abnormality. IMPRESSION: 1. Diffuse chronic degenerative disc disease and facet arthrosis throughout the lumbar spine. 2. Grade 1 degenerative anterolisthesis at L3-4 and L5-S1, as before. 3. Slight retrolisthesis of L1 on L2. 4. At L3-4, broad-based disc bulging and marked bilateral facet arthrosis cause moderate-to-severe central canal stenosis and bilateral lateral recess stenosis, worse on the left, with likely impingement of the L4 nerves in the lateral recesses and possibly the left L3 nerve in the neural foramen. 5. At L4-5, broad-based disc bulging and marked facet arthrosis cause severe central spinal canal stenosis and severe left neural foraminal stenosis. 6. At L5-S1, marked bilateral facet arthrosis causes mild-to-moderate central spinal canal stenosis. Electronically signed by: Evalene Coho MD 05/11/2024 08:18 AM EST RP Workstation: [id number]   CT ABDOMEN PELVIS W  CONTRAST Result Date: 05/11/2024 EXAM: CT ABDOMEN AND PELVIS WITH CONTRAST 05/11/2024 07:53:49 AM TECHNIQUE: CT of the abdomen and pelvis was performed with the administration of 100 mL of iohexol  (OMNIPAQUE ) 300 MG/ML solution. Multiplanar reformatted images are provided for review. Automated exposure control, iterative reconstruction, and/or weight-based adjustment of the mA/kV was utilized to reduce the radiation dose to as low as reasonably achievable. COMPARISON: CT of the abdomen and pelvis dated 04/28/2018. CLINICAL HISTORY: Abdominal pain, acute, nonlocalized. FINDINGS: LOWER CHEST: No acute abnormality. LIVER: Multiple lesions are again demonstrated within the liver. The largest, again noted within segment 5, measures approximately 2.8 cm in diameter and demonstrates peripheral puddling of contrast compatible with a hemangioma. Other lesions appear well circumscribed and hypodense, suggesting cysts. GALLBLADDER AND BILE DUCTS: Gallbladder is unremarkable. No biliary ductal dilatation. SPLEEN: No acute abnormality. PANCREAS: No acute abnormality. ADRENAL GLANDS: Mild hyperplasia of the left adrenal gland. KIDNEYS, URETERS AND BLADDER: Small probable cysts are noted within the kidneys bilaterally. Per consensus, no follow-up is needed for simple Bosniak type 1 and 2 renal cysts, unless the patient has a malignancy history or risk factors. No stones in the kidneys or ureters. No hydronephrosis. No perinephric or periureteral stranding. Urinary bladder is unremarkable. GI AND BOWEL: Stomach demonstrates no acute abnormality. There is no bowel obstruction. PERITONEUM AND RETROPERITONEUM: No ascites. No free air. VASCULATURE: The abdominal aorta is normal in caliber and demonstrates mild-to-moderate calcific atheromatous disease. There is moderate calcific atheromatous disease within the aortoiliac arteries. LYMPH NODES: No lymphadenopathy. REPRODUCTIVE ORGANS: No acute abnormality. BONES AND SOFT TISSUES:  There is grade 1 degenerative anterolisthesis at L3-4 and L5-S1. There is diffuse chronic degenerative disc disease throughout the visualized thoracolumbar spine. No focal soft tissue abnormality. IMPRESSION: 1. No acute findings in the abdomen or pelvis. 2. Multiple liver lesions, including a 2.8 cm hemangioma in segment 5 and additional hepatic cysts. 3. Small probable bilateral renal cysts. 4. Mild hyperplasia of the left adrenal gland. 5. Moderate calcific atheromatous disease within the aortoiliac arteries. Electronically signed by: Evalene Coho MD 05/11/2024 08:14 AM EST RP Workstation: [id number]   DG Chest 2 View Result Date: 05/11/2024 EXAM: 2 VIEW(S) XRAY OF THE CHEST 05/11/2024 07:39:57 AM COMPARISON: 10/12/2018i. CLINICAL HISTORY: Cough FINDINGS: LUNGS AND PLEURA: No focal pulmonary opacity. No pleural effusion. No pneumothorax. HEART AND MEDIASTINUM: No acute abnormality of the cardiac and mediastinal silhouettes. BONES AND SOFT TISSUES: No acute osseous abnormality. Degenerative changes of thoracic spine. IMPRESSION: 1. No acute cardiopulmonary process. Electronically signed by: Waddell Calk MD 05/11/2024 07:44 AM EST RP Workstation: [id number]   Assessment/Plan Raven [name] is a 73 y.o. female  with medical history significant for GERD, hypertension, hyperlipidemia being admitted to the hospital with 3 days of intractable nausea and vomiting as well as diffuse abdominal pain after eating a [employer] a day before her symptoms started.  Gastroenteritis-likely a viral gastroenteritis there is some mild dehydration with slight renal insufficiency, but not rising to the level of AKI. -Observation admission -IV fluids -Pain and nausea medication as needed -Regular diet as tolerated  Hypertension and hyperlipidemia-Home medications will be resumed as indicated  DVT prophylaxis: Lovenox      Code Status: Full Code  Consults called: None  Admission status: Observation  Time  spent: 48 minutes  Samyak Sackmann CHRISTELLA Gail MD [facility] Pager [phone]  If 7PM-7AM, please contact night-coverage [email or web address] Password [facility]  05/11/2024, 9:52 AM      [1]  Allergies Allergen Reactions   Procaine Hcl Hives and Rash    Novacaine   Tomato Rash   "

## 2024-05-12 LAB — BASIC METABOLIC PANEL WITH GFR
Anion gap: 10 (ref 5–15)
BUN: 16 mg/dL (ref 8–23)
CO2: 22 mmol/L (ref 22–32)
Calcium: 9 mg/dL (ref 8.9–10.3)
Chloride: 103 mmol/L (ref 98–111)
Creatinine, Ser: 1.02 mg/dL — ABNORMAL HIGH (ref 0.44–1.00)
GFR, Estimated: 57 mL/min — ABNORMAL LOW
Glucose, Bld: 94 mg/dL (ref 70–99)
Potassium: 3.6 mmol/L (ref 3.5–5.1)
Sodium: 135 mmol/L (ref 135–145)

## 2024-05-12 LAB — CBC
HCT: 31.5 % — ABNORMAL LOW (ref 36.0–46.0)
Hemoglobin: 10.7 g/dL — ABNORMAL LOW (ref 12.0–15.0)
MCH: 31 pg (ref 26.0–34.0)
MCHC: 34 g/dL (ref 30.0–36.0)
MCV: 91.3 fL (ref 80.0–100.0)
Platelets: 255 K/uL (ref 150–400)
RBC: 3.45 MIL/uL — ABNORMAL LOW (ref 3.87–5.11)
RDW: 13.3 % (ref 11.5–15.5)
WBC: 4.5 K/uL (ref 4.0–10.5)
nRBC: 0 % (ref 0.0–0.2)

## 2024-05-12 MED ORDER — LISINOPRIL-HYDROCHLOROTHIAZIDE 20-25 MG PO TABS: 0.5000 | ORAL_TABLET | Freq: Every day | ORAL | Status: DC

## 2024-05-12 MED ORDER — LISINOPRIL 10 MG PO TABS
10.0000 mg | ORAL_TABLET | Freq: Every day | ORAL | Status: DC
  Administered 2024-05-12 – 2024-05-13 (×2): 10 mg via ORAL
  Filled 2024-05-12 (×2): qty 1

## 2024-05-12 MED ORDER — ATORVASTATIN CALCIUM 20 MG PO TABS
40.0000 mg | ORAL_TABLET | Freq: Every day | ORAL | Status: DC
  Administered 2024-05-12 – 2024-05-13 (×2): 40 mg via ORAL
  Filled 2024-05-12 (×2): qty 2

## 2024-05-12 MED ORDER — LACTATED RINGERS IV SOLN: INTRAVENOUS | Status: AC

## 2024-05-12 MED ORDER — HYDROCHLOROTHIAZIDE 12.5 MG PO TABS
12.5000 mg | ORAL_TABLET | Freq: Every day | ORAL | Status: DC
  Administered 2024-05-12 – 2024-05-13 (×2): 12.5 mg via ORAL
  Filled 2024-05-12 (×2): qty 1

## 2024-05-12 NOTE — Progress Notes (Signed)
" °   05/12/24 0848  TOC Brief Assessment  Insurance and Status Reviewed  Patient has primary care physician Yes  Home environment has been reviewed Apartment  Prior level of function: Independent  Prior/Current Home Services No current home services  Social Drivers of Health Review SDOH reviewed no interventions necessary  Readmission risk has been reviewed Yes  Transition of care needs no transition of care needs at this time    Signed: Heather Saltness, MSW, LCSW Clinical Social Worker Inpatient Care Management 05/12/2024 8:48 AM   "

## 2024-05-12 NOTE — Care Management Obs Status (Signed)
 MEDICARE OBSERVATION STATUS NOTIFICATION   Patient Details  Name: Raven [name] MRN: [id number] Date of Birth: [birth year]   Medicare Observation Status Notification Given:  Yes    Heather DELENA Saltness, LCSW 05/12/2024, 9:33 AM

## 2024-05-12 NOTE — Plan of Care (Signed)

## 2024-05-12 NOTE — Progress Notes (Addendum)
 " PROGRESS NOTE    Raven [name]  FMW:[id number] DOB: 03-Mar-1951 DOA: 05/11/2024 PCP: Donah Laymon PARAS, MD     Brief Narrative:  Raven [name] is a 73 y.o. female with medical history significant for GERD, hypertension, hyperlipidemia being admitted to the hospital with 3 days of intractable nausea and vomiting as well as diffuse abdominal pain after eating a [employer] a day before her symptoms started.  She had some subjective fevers at home, has been feeling very dehydrated, the last time she was able to keep down any solid or liquid food was the day she had [employer].  No sick contacts as far she knows, her family including her grandson ate with her, and he did not get sick.  Early this morning in the emergency department, she was p.o. challenged and vomited again and therefore hospitalist was contacted for observation admission since that time, per her bedside nurse she has tolerated some crackers and cheese without vomiting.   New events last 24 hours / Subjective: Patient was able to have some bites of food this morning.  No further nausea, vomiting or diarrhea but not feeling well overall.  Still very weak.  Assessment & Plan:   Principal Problem:   Gastroenteritis Active Problems:   Hyperlipidemia   HYPERTENSION, BENIGN SYSTEMIC   SCIATICA, LEFT   Hepatic cyst   Complex renal cyst   Nausea, vomiting, diarrhea in setting of gastroenteritis - CT abdomen pelvis negative for acute findings in abdomen or pelvis - GI PCR, C. difficile has been ordered, pending collection - Continue IV fluids and continue to monitor oral intake - Still not feeling well overall  Incidental CT findings of liver hemangioma, hepatic cysts, renal cysts, hyperplasia left adrenal gland as well as chronic degenerative disc disease throughout the lumbar spine with some disc bulging - Outpatient follow-up  Hyperlipidemia - Lipitor  Hypertension - Lisinopril -HCTZ  CKD stage IIIa -  Stable  DVT prophylaxis:  enoxaparin  (LOVENOX ) injection 40 mg Start: 05/11/24 1000  Code Status: Full code Family Communication: No family at bedside Disposition Plan: Home Status is: Observation The patient will require care spanning > 2 midnights and should be moved to inpatient because: Continue IV fluid 1 more day, likely discharge home 12/23    Antimicrobials:  Anti-infectives (From admission, onward)    None        Objective: Vitals:   05/12/24 0158 05/12/24 0257 05/12/24 0609 05/12/24 1024  BP: (!) 141/65  (!) 117/58 (!) 118/58  Pulse: 81  84 83  Resp: 18  18 16   Temp: (!) 97.5 F (36.4 C)  97.6 F (36.4 C) 98.8 F (37.1 C)  TempSrc:      SpO2: 100% 100% 100% 100%  Weight:      Height:        Intake/Output Summary (Last 24 hours) at 05/12/2024 1157 Last data filed at 05/12/2024 1048 Gross per 24 hour  Intake 2722.61 ml  Output 1450 ml  Net 1272.61 ml   Filed Weights   05/11/24 0357  Weight: 72.6 kg    Examination:  General exam: Appears calm  Respiratory system: Respiratory effort normal. No respiratory distress. No conversational dyspnea.  Gastrointestinal system: Abdomen is nondistended, soft  Central nervous system: Alert and oriented. No focal neurological deficits. Speech clear.  Extremities: Symmetric in appearance  Skin: No rashes, lesions or ulcers on exposed skin  Psychiatry: Judgement and insight appear normal. Mood & affect appropriate.   Data Reviewed: I have personally reviewed following  labs and imaging studies  CBC: Recent Labs  Lab 05/11/24 0610 05/12/24 0421  WBC 6.6 4.5  NEUTROABS 4.7  --   HGB 13.5 10.7*  HCT 39.9 31.5*  MCV 91.9 91.3  PLT 302 255   Basic Metabolic Panel: Recent Labs  Lab 05/11/24 0610 05/12/24 0421  NA 133* 135  K 3.7 3.6  CL 97* 103  CO2 21* 22  GLUCOSE 93 94  BUN 23 16  CREATININE 1.16* 1.02*  CALCIUM  9.8 9.0   GFR: Estimated Creatinine Clearance: 46.2 mL/min (A) (by C-G formula  based on SCr of 1.02 mg/dL (H)). Liver Function Tests: Recent Labs  Lab 05/11/24 0610  AST 43*  ALT 27  ALKPHOS 114  BILITOT 0.5  PROT 8.2*  ALBUMIN 4.4   Recent Labs  Lab 05/11/24 0610  LIPASE 32   No results for input(s): AMMONIA in the last 168 hours. Coagulation Profile: No results for input(s): INR, PROTIME in the last 168 hours. Cardiac Enzymes: No results for input(s): CKTOTAL, CKMB, CKMBINDEX, TROPONINI in the last 168 hours. BNP (last 3 results) No results for input(s): PROBNP in the last 8760 hours. HbA1C: No results for input(s): HGBA1C in the last 72 hours. CBG: No results for input(s): GLUCAP in the last 168 hours. Lipid Profile: No results for input(s): CHOL, HDL, LDLCALC, TRIG, CHOLHDL, LDLDIRECT in the last 72 hours. Thyroid  Function Tests: No results for input(s): TSH, T4TOTAL, FREET4, T3FREE, THYROIDAB in the last 72 hours. Anemia Panel: No results for input(s): VITAMINB12, FOLATE, FERRITIN, TIBC, IRON, RETICCTPCT in the last 72 hours. Sepsis Labs: No results for input(s): PROCALCITON, LATICACIDVEN in the last 168 hours.  Recent Results (from the past 240 hours)  Resp panel by RT-PCR (RSV, Flu A&B, Covid) Anterior Nasal Swab     Status: None   Collection Time: 05/11/24  3:57 AM   Specimen: Anterior Nasal Swab  Result Value Ref Range Status   SARS Coronavirus 2 by RT PCR NEGATIVE NEGATIVE Final    Comment: (NOTE) SARS-CoV-2 target nucleic acids are NOT DETECTED.  The SARS-CoV-2 RNA is generally detectable in upper respiratory specimens during the acute phase of infection. The lowest concentration of SARS-CoV-2 viral copies this assay can detect is 138 copies/mL. A negative result does not preclude SARS-Cov-2 infection and should not be used as the sole basis for treatment or other patient management decisions. A negative result may occur with  improper specimen collection/handling,  submission of specimen other than nasopharyngeal swab, presence of viral mutation(s) within the areas targeted by this assay, and inadequate number of viral copies(<138 copies/mL). A negative result must be combined with clinical observations, patient history, and epidemiological information. The expected result is Negative.  Fact Sheet for Patients:  bloggercourse.com  Fact Sheet for Healthcare Providers:  seriousbroker.it  This test is no t yet approved or cleared by the [place]  FDA and  has been authorized for detection and/or diagnosis of SARS-CoV-2 by FDA under an Emergency Use Authorization (EUA). This EUA will remain  in effect (meaning this test can be used) for the duration of the COVID-19 declaration under Section 564(b)(1) of the Act, 21 U.S.C.section 360bbb-3(b)(1), unless the authorization is terminated  or revoked sooner.       Influenza A by PCR NEGATIVE NEGATIVE Final   Influenza B by PCR NEGATIVE NEGATIVE Final    Comment: (NOTE) The Xpert Xpress SARS-CoV-2/FLU/RSV plus assay is intended as an aid in the diagnosis of influenza from Nasopharyngeal swab specimens and should not be  used as a sole basis for treatment. Nasal washings and aspirates are unacceptable for Xpert Xpress SARS-CoV-2/FLU/RSV testing.  Fact Sheet for Patients: bloggercourse.com  Fact Sheet for Healthcare Providers: seriousbroker.it  This test is not yet approved or cleared by the [place]  FDA and has been authorized for detection and/or diagnosis of SARS-CoV-2 by FDA under an Emergency Use Authorization (EUA). This EUA will remain in effect (meaning this test can be used) for the duration of the COVID-19 declaration under Section 564(b)(1) of the Act, 21 U.S.C. section 360bbb-3(b)(1), unless the authorization is terminated or revoked.     Resp Syncytial Virus by PCR NEGATIVE  NEGATIVE Final    Comment: (NOTE) Fact Sheet for Patients: bloggercourse.com  Fact Sheet for Healthcare Providers: seriousbroker.it  This test is not yet approved or cleared by the [place]  FDA and has been authorized for detection and/or diagnosis of SARS-CoV-2 by FDA under an Emergency Use Authorization (EUA). This EUA will remain in effect (meaning this test can be used) for the duration of the COVID-19 declaration under Section 564(b)(1) of the Act, 21 U.S.C. section 360bbb-3(b)(1), unless the authorization is terminated or revoked.  Performed at [facility], [address] [place], [place] [address]       Radiology Studies: CT L-SPINE NO CHARGE Result Date: 05/11/2024 EXAM: CT OF THE LUMBAR SPINE WITHOUT CONTRAST 05/11/2024 07:53:49 AM TECHNIQUE: CT of the lumbar spine was performed without the administration of intravenous contrast. Multiplanar reformatted images are provided for review. Automated exposure control, iterative reconstruction, and/or weight based adjustment of the mA/kV was utilized to reduce the radiation dose to as low as reasonably achievable. COMPARISON: MRI of the lumbar spine dated 06/19/2023. CLINICAL HISTORY: FINDINGS: BONES AND ALIGNMENT: Normal vertebral body heights. No acute fracture or suspicious bone lesion. Alignment: Grade 1 degenerative anterolisthesis at L3-L4 and L5-S1, as before. Slight retrolisthesis of L1 on L2. DEGENERATIVE CHANGES: Diffuse chronic degenerative disc disease and facet arthrosis throughout the lumbar spine. Specific findings per level: *   **L1-L2:** Slight retrolisthesis of L1 on L2. *   **L3-L4:** Broad-based disc bulging and marked bilateral facet arthrosis, causing moderate-to-severe central canal stenosis and bilateral lateral recess stenosis, worse on the left. Likely impingement of the L4 nerves in the lateral recesses and possibly the left L3 nerve in  the neural foramen. *   **L4-L5:** Broad-based disc bulging and marked facet arthrosis, with severe central spinal canal stenosis and severe left neural foraminal stenosis. *   **L5-S1:** Marked bilateral facet arthrosis and mild-to-moderate central spinal canal stenosis. Grade 1 degenerative anterolisthesis, as before. SOFT TISSUES: No acute abnormality. IMPRESSION: 1. Diffuse chronic degenerative disc disease and facet arthrosis throughout the lumbar spine. 2. Grade 1 degenerative anterolisthesis at L3-4 and L5-S1, as before. 3. Slight retrolisthesis of L1 on L2. 4. At L3-4, broad-based disc bulging and marked bilateral facet arthrosis cause moderate-to-severe central canal stenosis and bilateral lateral recess stenosis, worse on the left, with likely impingement of the L4 nerves in the lateral recesses and possibly the left L3 nerve in the neural foramen. 5. At L4-5, broad-based disc bulging and marked facet arthrosis cause severe central spinal canal stenosis and severe left neural foraminal stenosis. 6. At L5-S1, marked bilateral facet arthrosis causes mild-to-moderate central spinal canal stenosis. Electronically signed by: Evalene Coho MD 05/11/2024 08:18 AM EST RP Workstation: [id number]   CT ABDOMEN PELVIS W CONTRAST Result Date: 05/11/2024 EXAM: CT ABDOMEN AND PELVIS WITH CONTRAST 05/11/2024 07:53:49 AM TECHNIQUE: CT of the abdomen and  pelvis was performed with the administration of 100 mL of iohexol  (OMNIPAQUE ) 300 MG/ML solution. Multiplanar reformatted images are provided for review. Automated exposure control, iterative reconstruction, and/or weight-based adjustment of the mA/kV was utilized to reduce the radiation dose to as low as reasonably achievable. COMPARISON: CT of the abdomen and pelvis dated 04/28/2018. CLINICAL HISTORY: Abdominal pain, acute, nonlocalized. FINDINGS: LOWER CHEST: No acute abnormality. LIVER: Multiple lesions are again demonstrated within the liver. The largest, again  noted within segment 5, measures approximately 2.8 cm in diameter and demonstrates peripheral puddling of contrast compatible with a hemangioma. Other lesions appear well circumscribed and hypodense, suggesting cysts. GALLBLADDER AND BILE DUCTS: Gallbladder is unremarkable. No biliary ductal dilatation. SPLEEN: No acute abnormality. PANCREAS: No acute abnormality. ADRENAL GLANDS: Mild hyperplasia of the left adrenal gland. KIDNEYS, URETERS AND BLADDER: Small probable cysts are noted within the kidneys bilaterally. Per consensus, no follow-up is needed for simple Bosniak type 1 and 2 renal cysts, unless the patient has a malignancy history or risk factors. No stones in the kidneys or ureters. No hydronephrosis. No perinephric or periureteral stranding. Urinary bladder is unremarkable. GI AND BOWEL: Stomach demonstrates no acute abnormality. There is no bowel obstruction. PERITONEUM AND RETROPERITONEUM: No ascites. No free air. VASCULATURE: The abdominal aorta is normal in caliber and demonstrates mild-to-moderate calcific atheromatous disease. There is moderate calcific atheromatous disease within the aortoiliac arteries. LYMPH NODES: No lymphadenopathy. REPRODUCTIVE ORGANS: No acute abnormality. BONES AND SOFT TISSUES: There is grade 1 degenerative anterolisthesis at L3-4 and L5-S1. There is diffuse chronic degenerative disc disease throughout the visualized thoracolumbar spine. No focal soft tissue abnormality. IMPRESSION: 1. No acute findings in the abdomen or pelvis. 2. Multiple liver lesions, including a 2.8 cm hemangioma in segment 5 and additional hepatic cysts. 3. Small probable bilateral renal cysts. 4. Mild hyperplasia of the left adrenal gland. 5. Moderate calcific atheromatous disease within the aortoiliac arteries. Electronically signed by: Evalene Coho MD 05/11/2024 08:14 AM EST RP Workstation: [id number]   DG Chest 2 View Result Date: 05/11/2024 EXAM: 2 VIEW(S) XRAY OF THE CHEST 05/11/2024  07:39:57 AM COMPARISON: 10/12/2018i. CLINICAL HISTORY: Cough FINDINGS: LUNGS AND PLEURA: No focal pulmonary opacity. No pleural effusion. No pneumothorax. HEART AND MEDIASTINUM: No acute abnormality of the cardiac and mediastinal silhouettes. BONES AND SOFT TISSUES: No acute osseous abnormality. Degenerative changes of thoracic spine. IMPRESSION: 1. No acute cardiopulmonary process. Electronically signed by: Waddell Calk MD 05/11/2024 07:44 AM EST RP Workstation: [id number]      Scheduled Meds:  atorvastatin   40 mg Oral Daily   enoxaparin  (LOVENOX ) injection  40 mg Subcutaneous Daily   lisinopril -hydrochlorothiazide   0.5 tablet Oral Daily   Continuous Infusions:  lactated ringers  100 mL/hr at 05/12/24 0733     LOS: 0 days   Time spent: 25 minutes   Delon Hoe, DO [facility] 05/12/2024, 11:57 AM   Available via Epic secure chat 7am-7pm After these hours, please refer to coverage provider listed on [email or web address]  "

## 2024-05-12 NOTE — Evaluation (Signed)
 Physical Therapy Evaluation-1x Patient Details Name: Raven [name] MRN: [id number] DOB: May 15, 1951 Today's Date: 05/12/2024  History of Present Illness  73 yo female admitted with gastroenteritis, n/v/d. Hx of sciatica, asthma  Clinical Impression  On eval, pt was Mod Ind with mobility. She ambulated ~450 feet while navigating with IV pole. Pt tolerated distance well. No LOB during session. Pt tolerated activity well. Pt plans to d/c home once medically ready. No follow up PT needs anticipated. Recommend daily hallway ambulation with nursing and/or mobility team.       If plan is discharge home, recommend the following:     Can travel by private vehicle        Equipment Recommendations None recommended by PT  Recommendations for Other Services       Functional Status Assessment Patient has had a recent decline in their functional status and demonstrates the ability to make significant improvements in function in a reasonable and predictable amount of time.     Precautions / Restrictions Precautions Precautions: None Restrictions Weight Bearing Restrictions Per Provider Order: No      Mobility  Bed Mobility Overal bed mobility: Modified Independent             General bed mobility comments: increased time.    Transfers Overall transfer level: Modified independent                 General transfer comment: increased time    Ambulation/Gait Ambulation/Gait assistance: Modified independent (Device/Increase time) Gait Distance (Feet): 450 Feet Assistive device: IV Pole Gait Pattern/deviations: Step-through pattern, Decreased stride length       General Gait Details: tolerated distance well. no overt lob. denied dizziness. good gait speed  Stairs            Wheelchair Mobility     Tilt Bed    Modified Rankin (Stroke Patients Only)       Balance Overall balance assessment: Mild deficits observed, not formally tested                                            Pertinent Vitals/Pain Pain Assessment Pain Assessment: Faces Faces Pain Scale: Hurts little more Pain Intervention(s): Monitored during session    Home Living Family/patient expects to be discharged to:: Private residence Living Arrangements: Alone   Type of Home: Apartment Home Access: Level entry       Home Layout: One level Home Equipment: None      Prior Function Prior Level of Function : Independent/Modified Independent                     Extremity/Trunk Assessment   Upper Extremity Assessment Upper Extremity Assessment: Overall WFL for tasks assessed    Lower Extremity Assessment Lower Extremity Assessment: Overall WFL for tasks assessed    Cervical / Trunk Assessment Cervical / Trunk Assessment: Normal  Communication   Communication Communication: No apparent difficulties    Cognition Arousal: Alert Behavior During Therapy: WFL for tasks assessed/performed   PT - Cognitive impairments: No apparent impairments                         Following commands: Intact       Cueing Cueing Techniques: Verbal cues     General Comments      Exercises     Assessment/Plan  PT Assessment Patient does not need any further PT services  PT Problem List         PT Treatment Interventions      PT Goals (Current goals can be found in the Care Plan section)  Acute Rehab PT Goals Patient Stated Goal: home soon! PT Goal Formulation: All assessment and education complete, DC therapy    Frequency       Co-evaluation               AM-PAC PT 6 Clicks Mobility  Outcome Measure Help needed turning from your back to your side while in a flat bed without using bedrails?: None Help needed moving from lying on your back to sitting on the side of a flat bed without using bedrails?: None Help needed moving to and from a bed to a chair (including a wheelchair)?: None Help needed standing up from a  chair using your arms (e.g., wheelchair or bedside chair)?: None Help needed to walk in hospital room?: None Help needed climbing 3-5 steps with a railing? : None 6 Click Score: 24    End of Session   Activity Tolerance: Patient tolerated treatment well Patient left: in bed;with call bell/phone within reach        Time: 8585-8572 PT Time Calculation (min) (ACUTE ONLY): 13 min   Charges:   PT Evaluation $PT Eval Low Complexity: 1 Low   PT General Charges $$ ACUTE PT VISIT: 1 Visit            Dannial SQUIBB, PT Acute Rehabilitation  Office: [phone]

## 2024-05-13 ENCOUNTER — Other Ambulatory Visit (HOSPITAL_COMMUNITY): Payer: Self-pay

## 2024-05-13 DIAGNOSIS — K529 Noninfective gastroenteritis and colitis, unspecified: Secondary | ICD-10-CM | POA: Diagnosis not present

## 2024-05-13 MED ORDER — ONDANSETRON HCL 4 MG PO TABS
4.0000 mg | ORAL_TABLET | Freq: Four times a day (QID) | ORAL | 0 refills | Status: AC | PRN
  Filled 2024-05-13: qty 20, 5d supply, fill #0

## 2024-05-13 MED ORDER — TRAMADOL HCL 50 MG PO TABS
25.0000 mg | ORAL_TABLET | Freq: Once | ORAL | Status: AC
  Administered 2024-05-13: 25 mg via ORAL
  Filled 2024-05-13: qty 1

## 2024-05-13 MED ORDER — TRAMADOL HCL 50 MG PO TABS
50.0000 mg | ORAL_TABLET | Freq: Four times a day (QID) | ORAL | 0 refills | Status: DC | PRN
  Filled 2024-05-13: qty 20, 5d supply, fill #0

## 2024-05-13 NOTE — Progress Notes (Signed)
 Discharge medications delivered to patient at the bedside.

## 2024-05-13 NOTE — Discharge Summary (Incomplete)
 Physician Discharge Summary  [name] FMW:[id number] DOB: 1951-02-26 DOA: 05/11/2024  PCP: Donah Laymon PARAS, MD  Admit date: 05/11/2024 Discharge date: 05/13/2024 Discharging to: *** Recommendations for Outpatient Follow-up:  ***  Consults:  *** Procedures:  ***   Discharge Diagnoses:   Principal Problem:   Gastroenteritis Active Problems:   Hyperlipidemia   HYPERTENSION, BENIGN SYSTEMIC   SCIATICA, LEFT   Hepatic cyst   Complex renal cyst     Hospital Course:  ***  Principal Problem:   Gastroenteritis Active Problems:   Hyperlipidemia   HYPERTENSION, BENIGN SYSTEMIC   SCIATICA, LEFT   Hepatic cyst   Complex renal cyst    Body mass index is 28.34 kg/m. Nutrition Status:          Discharge Instructions   Allergies as of 05/13/2024       Reactions   Procaine Hcl Hives, Rash   Novacaine   Tomato Rash        Medication List     TAKE these medications    acetaminophen  500 MG tablet Commonly known as: TYLENOL  Take 500 mg by mouth every 6 (six) hours as needed.   albuterol  (2.5 MG/3ML) 0.083% nebulizer solution Commonly known as: PROVENTIL  INHALE THE CONTENTS OF 1 VIAL VIA NEBULIZER EVERY 6 HOURS AS NEEDED FOR SHORTNESS OF BREATH  OR  WHEEZING What changed: See the new instructions.   albuterol  108 (90 Base) MCG/ACT inhaler Commonly known as: VENTOLIN  HFA INHALE 2 PUFFS INTO THE LUNGS EVERY 6 (SIX) HOURS AS NEEDED FOR WHEEZING OR SHORTNESS OF BREATH. What changed: Another medication with the same name was changed. Make sure you understand how and when to take each.   atorvastatin  40 MG tablet Commonly known as: LIPITOR TAKE 1 TABLET EVERY DAY   Cholecalciferol 25 MCG (1000 UT) tablet Take 1,000 Units by mouth daily.   lisinopril -hydrochlorothiazide  20-25 MG tablet Commonly known as: ZESTORETIC  TAKE 1/2 TABLET EVERY DAY   multivitamin with minerals Tabs tablet Take 1 tablet by mouth daily.   ondansetron  4 MG  tablet Commonly known as: ZOFRAN  Take 1 tablet (4 mg total) by mouth every 6 (six) hours as needed for nausea.   polyethylene glycol powder 17 GM/SCOOP powder Commonly known as: GLYCOLAX /MIRALAX  Take 17 g by mouth 2 (two) times daily as needed for mild constipation.   Shingrix  injection Generic drug: Zoster Vaccine Adjuvanted Administer Shingrix  vaccination now and repeat in two months   traMADol  50 MG tablet Commonly known as: ULTRAM  Take 1 tablet (50 mg total) by mouth every 6 (six) hours as needed. for pain What changed: when to take this            The results of significant diagnostics from this hospitalization (including imaging, microbiology, ancillary and laboratory) are listed below for reference.    CT L-SPINE NO CHARGE Result Date: 05/11/2024 EXAM: CT OF THE LUMBAR SPINE WITHOUT CONTRAST 05/11/2024 07:53:49 AM TECHNIQUE: CT of the lumbar spine was performed without the administration of intravenous contrast. Multiplanar reformatted images are provided for review. Automated exposure control, iterative reconstruction, and/or weight based adjustment of the mA/kV was utilized to reduce the radiation dose to as low as reasonably achievable. COMPARISON: MRI of the lumbar spine dated 06/19/2023. CLINICAL HISTORY: FINDINGS: BONES AND ALIGNMENT: Normal vertebral body heights. No acute fracture or suspicious bone lesion. Alignment: Grade 1 degenerative anterolisthesis at L3-L4 and L5-S1, as before. Slight retrolisthesis of L1 on L2. DEGENERATIVE CHANGES: Diffuse chronic degenerative disc disease and facet arthrosis throughout the lumbar  spine. Specific findings per level: *   **L1-L2:** Slight retrolisthesis of L1 on L2. *   **L3-L4:** Broad-based disc bulging and marked bilateral facet arthrosis, causing moderate-to-severe central canal stenosis and bilateral lateral recess stenosis, worse on the left. Likely impingement of the L4 nerves in the lateral recesses and possibly the left L3  nerve in the neural foramen. *   **L4-L5:** Broad-based disc bulging and marked facet arthrosis, with severe central spinal canal stenosis and severe left neural foraminal stenosis. *   **L5-S1:** Marked bilateral facet arthrosis and mild-to-moderate central spinal canal stenosis. Grade 1 degenerative anterolisthesis, as before. SOFT TISSUES: No acute abnormality. IMPRESSION: 1. Diffuse chronic degenerative disc disease and facet arthrosis throughout the lumbar spine. 2. Grade 1 degenerative anterolisthesis at L3-4 and L5-S1, as before. 3. Slight retrolisthesis of L1 on L2. 4. At L3-4, broad-based disc bulging and marked bilateral facet arthrosis cause moderate-to-severe central canal stenosis and bilateral lateral recess stenosis, worse on the left, with likely impingement of the L4 nerves in the lateral recesses and possibly the left L3 nerve in the neural foramen. 5. At L4-5, broad-based disc bulging and marked facet arthrosis cause severe central spinal canal stenosis and severe left neural foraminal stenosis. 6. At L5-S1, marked bilateral facet arthrosis causes mild-to-moderate central spinal canal stenosis. Electronically signed by: Evalene Coho MD 05/11/2024 08:18 AM EST RP Workstation: [id number]   CT ABDOMEN PELVIS W CONTRAST Result Date: 05/11/2024 EXAM: CT ABDOMEN AND PELVIS WITH CONTRAST 05/11/2024 07:53:49 AM TECHNIQUE: CT of the abdomen and pelvis was performed with the administration of 100 mL of iohexol  (OMNIPAQUE ) 300 MG/ML solution. Multiplanar reformatted images are provided for review. Automated exposure control, iterative reconstruction, and/or weight-based adjustment of the mA/kV was utilized to reduce the radiation dose to as low as reasonably achievable. COMPARISON: CT of the abdomen and pelvis dated 04/28/2018. CLINICAL HISTORY: Abdominal pain, acute, nonlocalized. FINDINGS: LOWER CHEST: No acute abnormality. LIVER: Multiple lesions are again demonstrated within the liver. The  largest, again noted within segment 5, measures approximately 2.8 cm in diameter and demonstrates peripheral puddling of contrast compatible with a hemangioma. Other lesions appear well circumscribed and hypodense, suggesting cysts. GALLBLADDER AND BILE DUCTS: Gallbladder is unremarkable. No biliary ductal dilatation. SPLEEN: No acute abnormality. PANCREAS: No acute abnormality. ADRENAL GLANDS: Mild hyperplasia of the left adrenal gland. KIDNEYS, URETERS AND BLADDER: Small probable cysts are noted within the kidneys bilaterally. Per consensus, no follow-up is needed for simple Bosniak type 1 and 2 renal cysts, unless the patient has a malignancy history or risk factors. No stones in the kidneys or ureters. No hydronephrosis. No perinephric or periureteral stranding. Urinary bladder is unremarkable. GI AND BOWEL: Stomach demonstrates no acute abnormality. There is no bowel obstruction. PERITONEUM AND RETROPERITONEUM: No ascites. No free air. VASCULATURE: The abdominal aorta is normal in caliber and demonstrates mild-to-moderate calcific atheromatous disease. There is moderate calcific atheromatous disease within the aortoiliac arteries. LYMPH NODES: No lymphadenopathy. REPRODUCTIVE ORGANS: No acute abnormality. BONES AND SOFT TISSUES: There is grade 1 degenerative anterolisthesis at L3-4 and L5-S1. There is diffuse chronic degenerative disc disease throughout the visualized thoracolumbar spine. No focal soft tissue abnormality. IMPRESSION: 1. No acute findings in the abdomen or pelvis. 2. Multiple liver lesions, including a 2.8 cm hemangioma in segment 5 and additional hepatic cysts. 3. Small probable bilateral renal cysts. 4. Mild hyperplasia of the left adrenal gland. 5. Moderate calcific atheromatous disease within the aortoiliac arteries. Electronically signed by: Evalene Coho MD 05/11/2024 08:14 AM EST RP  Workstation: [id number]   DG Chest 2 View Result Date: 05/11/2024 EXAM: 2 VIEW(S) XRAY OF THE CHEST  05/11/2024 07:39:57 AM COMPARISON: 10/12/2018i. CLINICAL HISTORY: Cough FINDINGS: LUNGS AND PLEURA: No focal pulmonary opacity. No pleural effusion. No pneumothorax. HEART AND MEDIASTINUM: No acute abnormality of the cardiac and mediastinal silhouettes. BONES AND SOFT TISSUES: No acute osseous abnormality. Degenerative changes of thoracic spine. IMPRESSION: 1. No acute cardiopulmonary process. Electronically signed by: Waddell Calk MD 05/11/2024 07:44 AM EST RP Workstation: [id number]   Labs:   Basic Metabolic Panel: Recent Labs  Lab 05/11/24 0610 05/12/24 0421  NA 133* 135  K 3.7 3.6  CL 97* 103  CO2 21* 22  GLUCOSE 93 94  BUN 23 16  CREATININE 1.16* 1.02*  CALCIUM  9.8 9.0     CBC: Recent Labs  Lab 05/11/24 0610 05/12/24 0421  WBC 6.6 4.5  NEUTROABS 4.7  --   HGB 13.5 10.7*  HCT 39.9 31.5*  MCV 91.9 91.3  PLT 302 255         SIGNED:   True Atlas, MD  [facility] 05/13/2024, 9:07 AM Time taking on discharge: 50 minutes

## 2024-05-13 NOTE — Plan of Care (Signed)
   Problem: Education: Goal: Knowledge of General Education information will improve Description Including pain rating scale, medication(s)/side effects and non-pharmacologic comfort measures Outcome: Progressing   Problem: Health Behavior/Discharge Planning: Goal: Ability to manage health-related needs will improve Outcome: Progressing

## 2024-05-20 ENCOUNTER — Ambulatory Visit (INDEPENDENT_AMBULATORY_CARE_PROVIDER_SITE_OTHER): Payer: Self-pay | Admitting: Family Medicine

## 2024-05-20 ENCOUNTER — Ambulatory Visit: Payer: Self-pay | Admitting: Family Medicine

## 2024-05-20 VITALS — BP 143/78 | HR 84 | Ht 63.0 in | Wt 161.5 lb

## 2024-05-20 DIAGNOSIS — D649 Anemia, unspecified: Secondary | ICD-10-CM

## 2024-05-20 DIAGNOSIS — R630 Anorexia: Secondary | ICD-10-CM | POA: Diagnosis not present

## 2024-05-20 DIAGNOSIS — R051 Acute cough: Secondary | ICD-10-CM | POA: Diagnosis not present

## 2024-05-20 DIAGNOSIS — Z9289 Personal history of other medical treatment: Secondary | ICD-10-CM | POA: Diagnosis not present

## 2024-05-20 DIAGNOSIS — I10 Essential (primary) hypertension: Secondary | ICD-10-CM

## 2024-05-20 LAB — POCT HEMOGLOBIN: Hemoglobin: 13.1 g/dL (ref 11–14.6)

## 2024-05-20 MED ORDER — BENZONATATE 100 MG PO CAPS: 100.0000 mg | ORAL_CAPSULE | Freq: Every day | ORAL | 0 refills | Status: DC

## 2024-05-20 NOTE — Progress Notes (Signed)
 "    SUBJECTIVE:   CHIEF COMPLAINT / HPI:   [name] presents today for hospital follow up.   Hospitalized at [facility] from 12/21 - 05/13/24 for gastroenteritis.  She received fluids and symptoms of nausea, vomiting, diarrhea did improve through the course of hospitalization.  CT A/P was negative for acute findings; however there were incidental findings of liver hemangioma, hepatic cysts, renal cysts, hyperplasia of left adrenal gland, chronic degenerative disc disease throughout lumbar spine with some disc bulging.  Since discharge, patient reports poor appetite; she is drinking water but has not eaten because she has no appetite. She has not had any nausea since discharge, but notes she has zofran  at home if needed. She says she had one episode of vomiting and diarrhea since discharge after she ate some grapes. She also has cough and says vomiting episode occurred after coughing a lot and getting choked up on clear/yellow phlegm. Her cough is the most troubling symptom currently to the patient.  She has not taken any of her medicines this morning because she has not been eating, including BP medication lisinopril -hydrochlorothiazide  20-25 mg 1/2 half tablet daily.  She also notes chronic low back pain and right leg sciatica, which is slightly worse since she got sick. She denies belly pain, chest pain, shortness of breath.  PERTINENT  PMH / PSH: Reviewed.  OBJECTIVE:   BP (!) 143/78   Pulse 84   Ht 5' 3 (1.6 m)   Wt 161 lb 8 oz (73.3 kg)   SpO2 98%   BMI 28.61 kg/m   General: Uncomfortable-appearing, no acute distress. HEENT: normocephalic, PERRLA, EOM grossly intact, MMM Cardio: Regular rate, regular rhythm, no murmurs on exam. Pulm: Very mild wheezing though maintains good air movement; no focal lung sounds. No increased work of breathing. Abdominal: bowel sounds present, soft, mildly tender globally, non-distended. No HSM. Extremities: Moves all extremities  equally. Neuro: Alert and oriented x3, speech normal in content, no facial asymmetry Psych:  Cognition and judgment appear intact. Alert, communicative, and cooperative   ASSESSMENT/PLAN:   Assessment & Plan Acute cough This is the most bothersome symptom to the patient at this time; very low suspicion for bacterial infection given that she is afebrile, sputum production is mostly clear, no focal findings suggestive of pneumonia.  Anticipate this is a postviral URI cough which will take time to resolve. -Short course of Tessalon  Perles sent to pharmacy to be used before bed; advised caution given her age, and patient states understanding. -Additionally she has listed allergy to procaine, discussed with pt and she reports a very mild itching sensation which occurred over 10 years ago when she received Novocain for a dental procedure; advised caution with use of tessalon  perles and patient is in agreement. -Continue to use inhaler as needed. Poor appetite Likely in the setting of normal recovery from GI illness; no red flag signs or symptoms on exam. - Discussed supportive care and encouraged frequent small meals, maintaining good hydration - Home Zofran  as needed for nausea if this occurs - Return precautions discussed Hospitalization within last 30 days Follow up mild anemia discovered during hospitalization. - Prefer CBC however lab stick was unsuccessful and patient did not want to try again; discussed and agreed to check POC hemoglobin which was normal. HYPERTENSION, BENIGN SYSTEMIC BP somewhat elevated today in the office in the setting of not taking blood pressure medication for the last several days due to poor p.o. intake.  No red flag signs  or symptoms. - Strongly encouraged patient to restart lisinopril -hydrochlorothiazide  20-25 mg 1/2 half tablet daily; she is in agreement.     Lauraine Norse, DO [facility]  "

## 2024-05-20 NOTE — Patient Instructions (Signed)
 It was so good to see you today! Thank you for allowing me to take care of you.  Today we discussed the following concerns and plans:  Continue to slowly increase your food intake with small, frequent meals. Stay well-hydrated.  Use tessalon  perles at bedtime to help with cough. Use caution as this can make you dizzy.   We will check your blood counts today as you were anemic in the hospital. I will let you know the results.  If you have any concerns, please call the clinic or schedule an appointment.  It was a pleasure to take care of you today. Be well!  Lauraine Norse, DO [facility], PGY-2  Do you need your medications delivered to your home?   Well send your prescription to the [facility] for delivery.          Address: [address] [place], [place], [place] [address]          Phone: [phone]  Please call the [facility] to speak with a pharmacist and set up your home medication delivery. If you have any questions, feel free to contact us  -- were happy to help!  Other [facility] that offer affordable prices on both prescriptions and over-the-counter items, as well as convenient services like vaccinations, are  [facility], at [facility]         Address:  [address], [place], [place] [address]         Phone: [phone]  [facility], located in the [facility]        Address: [address] [place], [place] [address]        Phone: [phone]  [facility], at [facility]       Address: [address], [place], [place] [address]       Phone: [phone]  [facility], at [facility]       Address: [address] Floor, [place], [place] [address]       Phone: [phone]

## 2024-05-20 NOTE — Assessment & Plan Note (Signed)
 Likely in the setting of normal recovery from GI illness; no red flag signs or symptoms on exam. - Discussed supportive care and encouraged frequent small meals, maintaining good hydration - Home Zofran  as needed for nausea if this occurs - Return precautions discussed

## 2024-05-20 NOTE — Assessment & Plan Note (Signed)
 BP somewhat elevated today in the office in the setting of not taking blood pressure medication for the last several days due to poor p.o. intake.  No red flag signs or symptoms. - Strongly encouraged patient to restart lisinopril -hydrochlorothiazide  20-25 mg 1/2 half tablet daily; she is in agreement.

## 2024-05-23 NOTE — Discharge Summary (Signed)
 " Physician Discharge Summary   Patient: Raven [name] MRN: [id number] DOB: 02-26-1951  Admit date:     05/11/2024  Discharge date: 05/13/2024  Discharge Physician: True Atlas   PCP: Donah Laymon PARAS, MD     Discharge Diagnoses: Principal Problem:   Gastroenteritis Active Problems:   Hyperlipidemia   HYPERTENSION, BENIGN SYSTEMIC   SCIATICA, LEFT   Hepatic cyst   Complex renal cyst    Brief Narrative:  Raven [name] is a 74 y.o. female with medical history significant for GERD, hypertension, hyperlipidemia being admitted to the hospital with 3 days of intractable nausea and vomiting as well as diffuse abdominal pain after eating a [employer] a day before her symptoms started.  She had some subjective fevers at home, has been feeling very dehydrated, the last time she was able to keep down any solid or liquid food was the day she had [employer].  No sick contacts as far she knows, her family including her grandson ate with her, and he did not get sick.  Early this morning in the emergency department, she was p.o. challenged and vomited again and therefore hospitalist was contacted for observation admission since that time, per her bedside nurse she has tolerated some crackers and cheese without vomiting.    Assessment & Plan:   Nausea, vomiting, diarrhea in setting of gastroenteritis - CT abdomen pelvis negative for acute findings in abdomen or pelvis - GI PCR, C. difficile has been ordered, pending collection - treated with IVF and improved   Incidental CT findings of liver hemangioma, hepatic cysts, renal cysts, hyperplasia left adrenal gland as well as chronic degenerative disc disease throughout the lumbar spine with some disc bulging - Outpatient follow-up   Hyperlipidemia - Lipitor   Hypertension - Lisinopril -HCTZ   CKD stage IIIa   DISCHARGE MEDICATION: Allergies as of 05/13/2024       Reactions   Procaine Hcl Hives, Rash   Novacaine   Tomato Rash         Medication List     TAKE these medications    acetaminophen  500 MG tablet Commonly known as: TYLENOL  Take 500 mg by mouth every 6 (six) hours as needed.   albuterol  (2.5 MG/3ML) 0.083% nebulizer solution Commonly known as: PROVENTIL  INHALE THE CONTENTS OF 1 VIAL VIA NEBULIZER EVERY 6 HOURS AS NEEDED FOR SHORTNESS OF BREATH  OR  WHEEZING What changed: See the new instructions.   albuterol  108 (90 Base) MCG/ACT inhaler Commonly known as: VENTOLIN  HFA INHALE 2 PUFFS INTO THE LUNGS EVERY 6 (SIX) HOURS AS NEEDED FOR WHEEZING OR SHORTNESS OF BREATH. What changed: Another medication with the same name was changed. Make sure you understand how and when to take each.   atorvastatin  40 MG tablet Commonly known as: LIPITOR TAKE 1 TABLET EVERY DAY   Cholecalciferol 25 MCG (1000 UT) tablet Take 1,000 Units by mouth daily.   lisinopril -hydrochlorothiazide  20-25 MG tablet Commonly known as: ZESTORETIC  TAKE 1/2 TABLET EVERY DAY   multivitamin with minerals Tabs tablet Take 1 tablet by mouth daily.   ondansetron  4 MG tablet Commonly known as: ZOFRAN  Take 1 tablet (4 mg total) by mouth every 6 (six) hours as needed for nausea.   polyethylene glycol powder 17 GM/SCOOP powder Commonly known as: GLYCOLAX /MIRALAX  Take 17 g by mouth 2 (two) times daily as needed for mild constipation.   Shingrix  injection Generic drug: Zoster Vaccine Adjuvanted Administer Shingrix  vaccination now and repeat in two months   traMADol  50 MG tablet Commonly  known as: ULTRAM  Take 1 tablet (50 mg total) by mouth every 6 (six) hours as needed. for pain What changed: when to take this          The results of significant diagnostics from this hospitalization (including imaging, microbiology, ancillary and laboratory) are listed below for reference.   Imaging Studies: CT L-SPINE NO CHARGE Result Date: 05/11/2024 EXAM: CT OF THE LUMBAR SPINE WITHOUT CONTRAST 05/11/2024 07:53:49 AM TECHNIQUE: CT of  the lumbar spine was performed without the administration of intravenous contrast. Multiplanar reformatted images are provided for review. Automated exposure control, iterative reconstruction, and/or weight based adjustment of the mA/kV was utilized to reduce the radiation dose to as low as reasonably achievable. COMPARISON: MRI of the lumbar spine dated 06/19/2023. CLINICAL HISTORY: FINDINGS: BONES AND ALIGNMENT: Normal vertebral body heights. No acute fracture or suspicious bone lesion. Alignment: Grade 1 degenerative anterolisthesis at L3-L4 and L5-S1, as before. Slight retrolisthesis of L1 on L2. DEGENERATIVE CHANGES: Diffuse chronic degenerative disc disease and facet arthrosis throughout the lumbar spine. Specific findings per level: *   **L1-L2:** Slight retrolisthesis of L1 on L2. *   **L3-L4:** Broad-based disc bulging and marked bilateral facet arthrosis, causing moderate-to-severe central canal stenosis and bilateral lateral recess stenosis, worse on the left. Likely impingement of the L4 nerves in the lateral recesses and possibly the left L3 nerve in the neural foramen. *   **L4-L5:** Broad-based disc bulging and marked facet arthrosis, with severe central spinal canal stenosis and severe left neural foraminal stenosis. *   **L5-S1:** Marked bilateral facet arthrosis and mild-to-moderate central spinal canal stenosis. Grade 1 degenerative anterolisthesis, as before. SOFT TISSUES: No acute abnormality. IMPRESSION: 1. Diffuse chronic degenerative disc disease and facet arthrosis throughout the lumbar spine. 2. Grade 1 degenerative anterolisthesis at L3-4 and L5-S1, as before. 3. Slight retrolisthesis of L1 on L2. 4. At L3-4, broad-based disc bulging and marked bilateral facet arthrosis cause moderate-to-severe central canal stenosis and bilateral lateral recess stenosis, worse on the left, with likely impingement of the L4 nerves in the lateral recesses and possibly the left L3 nerve in the neural  foramen. 5. At L4-5, broad-based disc bulging and marked facet arthrosis cause severe central spinal canal stenosis and severe left neural foraminal stenosis. 6. At L5-S1, marked bilateral facet arthrosis causes mild-to-moderate central spinal canal stenosis. Electronically signed by: Evalene Coho MD 05/11/2024 08:18 AM EST RP Workstation: [id number]   CT ABDOMEN PELVIS W CONTRAST Result Date: 05/11/2024 EXAM: CT ABDOMEN AND PELVIS WITH CONTRAST 05/11/2024 07:53:49 AM TECHNIQUE: CT of the abdomen and pelvis was performed with the administration of 100 mL of iohexol  (OMNIPAQUE ) 300 MG/ML solution. Multiplanar reformatted images are provided for review. Automated exposure control, iterative reconstruction, and/or weight-based adjustment of the mA/kV was utilized to reduce the radiation dose to as low as reasonably achievable. COMPARISON: CT of the abdomen and pelvis dated 04/28/2018. CLINICAL HISTORY: Abdominal pain, acute, nonlocalized. FINDINGS: LOWER CHEST: No acute abnormality. LIVER: Multiple lesions are again demonstrated within the liver. The largest, again noted within segment 5, measures approximately 2.8 cm in diameter and demonstrates peripheral puddling of contrast compatible with a hemangioma. Other lesions appear well circumscribed and hypodense, suggesting cysts. GALLBLADDER AND BILE DUCTS: Gallbladder is unremarkable. No biliary ductal dilatation. SPLEEN: No acute abnormality. PANCREAS: No acute abnormality. ADRENAL GLANDS: Mild hyperplasia of the left adrenal gland. KIDNEYS, URETERS AND BLADDER: Small probable cysts are noted within the kidneys bilaterally. Per consensus, no follow-up is needed for simple Bosniak type 1 and  2 renal cysts, unless the patient has a malignancy history or risk factors. No stones in the kidneys or ureters. No hydronephrosis. No perinephric or periureteral stranding. Urinary bladder is unremarkable. GI AND BOWEL: Stomach demonstrates no acute abnormality. There is  no bowel obstruction. PERITONEUM AND RETROPERITONEUM: No ascites. No free air. VASCULATURE: The abdominal aorta is normal in caliber and demonstrates mild-to-moderate calcific atheromatous disease. There is moderate calcific atheromatous disease within the aortoiliac arteries. LYMPH NODES: No lymphadenopathy. REPRODUCTIVE ORGANS: No acute abnormality. BONES AND SOFT TISSUES: There is grade 1 degenerative anterolisthesis at L3-4 and L5-S1. There is diffuse chronic degenerative disc disease throughout the visualized thoracolumbar spine. No focal soft tissue abnormality. IMPRESSION: 1. No acute findings in the abdomen or pelvis. 2. Multiple liver lesions, including a 2.8 cm hemangioma in segment 5 and additional hepatic cysts. 3. Small probable bilateral renal cysts. 4. Mild hyperplasia of the left adrenal gland. 5. Moderate calcific atheromatous disease within the aortoiliac arteries. Electronically signed by: Evalene Coho MD 05/11/2024 08:14 AM EST RP Workstation: [id number]   DG Chest 2 View Result Date: 05/11/2024 EXAM: 2 VIEW(S) XRAY OF THE CHEST 05/11/2024 07:39:57 AM COMPARISON: 10/12/2018i. CLINICAL HISTORY: Cough FINDINGS: LUNGS AND PLEURA: No focal pulmonary opacity. No pleural effusion. No pneumothorax. HEART AND MEDIASTINUM: No acute abnormality of the cardiac and mediastinal silhouettes. BONES AND SOFT TISSUES: No acute osseous abnormality. Degenerative changes of thoracic spine. IMPRESSION: 1. No acute cardiopulmonary process. Electronically signed by: Waddell Calk MD 05/11/2024 07:44 AM EST RP Workstation: [id number]    Microbiology: Results for orders placed or performed during the hospital encounter of 05/11/24  Resp panel by RT-PCR (RSV, Flu A&B, Covid) Anterior Nasal Swab     Status: None   Collection Time: 05/11/24  3:57 AM   Specimen: Anterior Nasal Swab  Result Value Ref Range Status   SARS Coronavirus 2 by RT PCR NEGATIVE NEGATIVE Final    Comment: (NOTE) SARS-CoV-2 target  nucleic acids are NOT DETECTED.  The SARS-CoV-2 RNA is generally detectable in upper respiratory specimens during the acute phase of infection. The lowest concentration of SARS-CoV-2 viral copies this assay can detect is 138 copies/mL. A negative result does not preclude SARS-Cov-2 infection and should not be used as the sole basis for treatment or other patient management decisions. A negative result may occur with  improper specimen collection/handling, submission of specimen other than nasopharyngeal swab, presence of viral mutation(s) within the areas targeted by this assay, and inadequate number of viral copies(<138 copies/mL). A negative result must be combined with clinical observations, patient history, and epidemiological information. The expected result is Negative.  Fact Sheet for Patients:  bloggercourse.com  Fact Sheet for Healthcare Providers:  seriousbroker.it  This test is no t yet approved or cleared by the [place]  FDA and  has been authorized for detection and/or diagnosis of SARS-CoV-2 by FDA under an Emergency Use Authorization (EUA). This EUA will remain  in effect (meaning this test can be used) for the duration of the COVID-19 declaration under Section 564(b)(1) of the Act, 21 U.S.C.section 360bbb-3(b)(1), unless the authorization is terminated  or revoked sooner.       Influenza A by PCR NEGATIVE NEGATIVE Final   Influenza B by PCR NEGATIVE NEGATIVE Final    Comment: (NOTE) The Xpert Xpress SARS-CoV-2/FLU/RSV plus assay is intended as an aid in the diagnosis of influenza from Nasopharyngeal swab specimens and should not be used as a sole basis for treatment. Nasal washings and aspirates are unacceptable for Xpert  Xpress SARS-CoV-2/FLU/RSV testing.  Fact Sheet for Patients: bloggercourse.com  Fact Sheet for Healthcare  Providers: seriousbroker.it  This test is not yet approved or cleared by the [place]  FDA and has been authorized for detection and/or diagnosis of SARS-CoV-2 by FDA under an Emergency Use Authorization (EUA). This EUA will remain in effect (meaning this test can be used) for the duration of the COVID-19 declaration under Section 564(b)(1) of the Act, 21 U.S.C. section 360bbb-3(b)(1), unless the authorization is terminated or revoked.     Resp Syncytial Virus by PCR NEGATIVE NEGATIVE Final    Comment: (NOTE) Fact Sheet for Patients: bloggercourse.com  Fact Sheet for Healthcare Providers: seriousbroker.it  This test is not yet approved or cleared by the [place]  FDA and has been authorized for detection and/or diagnosis of SARS-CoV-2 by FDA under an Emergency Use Authorization (EUA). This EUA will remain in effect (meaning this test can be used) for the duration of the COVID-19 declaration under Section 564(b)(1) of the Act, 21 U.S.C. section 360bbb-3(b)(1), unless the authorization is terminated or revoked.  Performed at [facility], [address] [place], [place] [address]     Labs: CBC: Recent Labs  Lab 05/20/24 0943  HGB 13.1     Discharge time spent: greater than 30 minutes.  Signed: Bertha Earwood, MD [facility] 05/23/2024 "

## 2024-05-27 ENCOUNTER — Ambulatory Visit: Admitting: Family Medicine

## 2024-06-02 ENCOUNTER — Ambulatory Visit: Admitting: Family Medicine

## 2024-06-02 ENCOUNTER — Encounter: Payer: Self-pay | Admitting: Family Medicine

## 2024-06-02 VITALS — BP 128/72 | HR 96 | Ht 63.0 in | Wt 161.4 lb

## 2024-06-02 DIAGNOSIS — I10 Essential (primary) hypertension: Secondary | ICD-10-CM

## 2024-06-02 DIAGNOSIS — M8580 Other specified disorders of bone density and structure, unspecified site: Secondary | ICD-10-CM | POA: Diagnosis not present

## 2024-06-02 DIAGNOSIS — G8929 Other chronic pain: Secondary | ICD-10-CM | POA: Diagnosis not present

## 2024-06-02 DIAGNOSIS — M5416 Radiculopathy, lumbar region: Secondary | ICD-10-CM

## 2024-06-02 DIAGNOSIS — Z0000 Encounter for general adult medical examination without abnormal findings: Secondary | ICD-10-CM

## 2024-06-02 MED ORDER — TRAMADOL HCL 50 MG PO TABS: 50.0000 mg | ORAL_TABLET | Freq: Three times a day (TID) | ORAL | 2 refills | Status: AC | PRN

## 2024-06-02 NOTE — Patient Instructions (Addendum)
 It was great to see you again today.  Look at [employer] on youtube - they have seated exercise workouts See attached eating plan to help with blood pressure  Schedule your bone density test by calling [phone]  Refilled tramadol  Follow up with me in 3 months, sooner if needed  Be well, Dr. Donah

## 2024-06-02 NOTE — Progress Notes (Unsigned)
"  °  Date of Visit: 06/02/2024   SUBJECTIVE:   HPI:  Discussed the use of AI scribe software for clinical note transcription with the patient, who gave verbal consent to proceed.  History of Present Illness Raven [name] is a 74 year old female who presents for a follow-up visit after hospitalization for an intestinal blockage.  Intestinal obstruction and recent hospitalization - Recent hospitalization for intestinal blockage - Underwent CT scan with contrast and additional diagnostic tests during admission - Had acute gastrointestinal symptoms described as a 'bad stomach bug' with inability to tolerate oral intake, now improved  Chronic back pain - Persistent chronic back pain - Receives pain management injections every three months, next appointment scheduled later this month - Takes tramadol  50 mg three times daily for pain control - Tramadol  less effective than previously, sometimes skips doses without significant change in pain level, but desires to continue, needs refill  Hypertension - currently taking lisinopril -hydrochlorothiazide  20-25mg  half tablet daily    OBJECTIVE:   BP 128/72   Pulse 96   Ht 5' 3 (1.6 m)   Wt 161 lb 6.4 oz (73.2 kg)   SpO2 97%   BMI 28.59 kg/m  Gen: no acute distress, pelasant cooperative HEENT: normocephalic, atraumatic  Heart: regular rate and rhythm, no murmur Lungs: clear to auscultation bilaterally, normal work of breathing  Neuro: alert, speech nromal, grossly nonfocal Ext: No appreciable lower extremity edema bilaterally   ASSESSMENT/PLAN:   Assessment & Plan Osteopenia, unspecified location On vitamin D . Due for DEXA. Ordered, given instructions on how to schedule Encounter for chronic pain management Chronic lumbar back pain with radiculopathy. Tramadol  less effective, missing doses not significantly affecting pain. - Refilled tramadol  prescription. - Advised tramadol  as needed up to three times daily. - Continue scheduled  injections for pain management. HYPERTENSION, BENIGN SYSTEMIC Well controlled. Continue current medication regimen.  - Provided dietary guidance for blood pressure management. - Recommended seated exercises, such as [employer] on YouTube. Routine adult health maintenance Reminded about shingrix  Has up coming appointment for mammogram DEXA ordered    FOLLOW UP: Follow up in 3 mos for above issues  Paris Hohn J. Donah, MD [facility] "

## 2024-06-04 NOTE — Assessment & Plan Note (Signed)
 Reminded about shingrix  Has up coming appointment for mammogram DEXA ordered

## 2024-06-04 NOTE — Assessment & Plan Note (Signed)
 On vitamin D . Due for DEXA. Ordered, given instructions on how to schedule

## 2024-06-04 NOTE — Assessment & Plan Note (Signed)
 Well controlled. Continue current medication regimen.  - Provided dietary guidance for blood pressure management. - Recommended seated exercises, such as [employer] on YouTube.

## 2024-06-04 NOTE — Assessment & Plan Note (Signed)
 Chronic lumbar back pain with radiculopathy. Tramadol  less effective, missing doses not significantly affecting pain. - Refilled tramadol  prescription. - Advised tramadol  as needed up to three times daily. - Continue scheduled injections for pain management.

## 2024-06-10 ENCOUNTER — Telehealth: Payer: Self-pay

## 2024-06-10 NOTE — Telephone Encounter (Signed)
 Patient calls nurse line requesting prescription for muscle relaxant for muscle spasms.   She was just in the office on 06/02/24, however, forgot to mention at visit.   She states that she was previously prescribed baclofen  and this did not work for her. She is requesting alternative.   Advised patient that I would reach out to PCP.   Please advise.   [name], RN

## 2024-06-11 NOTE — Telephone Encounter (Signed)
 There is not a good alternative to baclofen  for muscle spasms in a 74 year old - any other muscle relaxers are dangerous in this age group. If she wants a refill of baclofen  I am happy to send it in for her, but otherwise don't have much else to offer.  Please let patient know  Laymon JINNY Legions, MD

## 2024-06-12 NOTE — Telephone Encounter (Signed)
 Called patient and advised of message per Dr. Donah.   Patient is not interested in being prescribed Baclofen .   No further questions at this time.   [name], RN

## 2024-06-13 ENCOUNTER — Ambulatory Visit
Admission: RE | Admit: 2024-06-13 | Discharge: 2024-06-13 | Disposition: A | Source: Ambulatory Visit | Attending: Family Medicine | Admitting: Family Medicine

## 2024-06-13 DIAGNOSIS — Z1231 Encounter for screening mammogram for malignant neoplasm of breast: Secondary | ICD-10-CM
